# Patient Record
Sex: Female | Born: 1950
Health system: Southern US, Community
[De-identification: ages and names within clinical notes are randomized; demographics above are authoritative.]

## PROBLEM LIST (undated history)

## (undated) DIAGNOSIS — K759 Inflammatory liver disease, unspecified: Secondary | ICD-10-CM

## (undated) DIAGNOSIS — F988 Other specified behavioral and emotional disorders with onset usually occurring in childhood and adolescence: Secondary | ICD-10-CM

## (undated) DIAGNOSIS — K5900 Constipation, unspecified: Secondary | ICD-10-CM

## (undated) DIAGNOSIS — M1990 Unspecified osteoarthritis, unspecified site: Secondary | ICD-10-CM

## (undated) DIAGNOSIS — R600 Localized edema: Secondary | ICD-10-CM

## (undated) DIAGNOSIS — M797 Fibromyalgia: Secondary | ICD-10-CM

## (undated) DIAGNOSIS — F319 Bipolar disorder, unspecified: Secondary | ICD-10-CM

## (undated) DIAGNOSIS — R198 Other specified symptoms and signs involving the digestive system and abdomen: Secondary | ICD-10-CM

## (undated) DIAGNOSIS — S134XXA Sprain of ligaments of cervical spine, initial encounter: Secondary | ICD-10-CM

## (undated) DIAGNOSIS — K219 Gastro-esophageal reflux disease without esophagitis: Secondary | ICD-10-CM

## (undated) DIAGNOSIS — K589 Irritable bowel syndrome without diarrhea: Secondary | ICD-10-CM

## (undated) DIAGNOSIS — M810 Age-related osteoporosis without current pathological fracture: Secondary | ICD-10-CM

## (undated) DIAGNOSIS — S149XXA Injury of unspecified nerves of neck, initial encounter: Secondary | ICD-10-CM

## (undated) DIAGNOSIS — J40 Bronchitis, not specified as acute or chronic: Secondary | ICD-10-CM

## (undated) DIAGNOSIS — F329 Major depressive disorder, single episode, unspecified: Secondary | ICD-10-CM

## (undated) DIAGNOSIS — M549 Dorsalgia, unspecified: Secondary | ICD-10-CM

## (undated) DIAGNOSIS — G589 Mononeuropathy, unspecified: Secondary | ICD-10-CM

## (undated) DIAGNOSIS — H269 Unspecified cataract: Secondary | ICD-10-CM

## (undated) DIAGNOSIS — E785 Hyperlipidemia, unspecified: Secondary | ICD-10-CM

## (undated) DIAGNOSIS — R0602 Shortness of breath: Secondary | ICD-10-CM

## (undated) DIAGNOSIS — F32A Depression, unspecified: Secondary | ICD-10-CM

## (undated) DIAGNOSIS — Z8739 Personal history of other diseases of the musculoskeletal system and connective tissue: Secondary | ICD-10-CM

## (undated) DIAGNOSIS — F419 Anxiety disorder, unspecified: Secondary | ICD-10-CM

## (undated) HISTORY — DX: Bipolar disorder, unspecified: F31.9

## (undated) HISTORY — PX: TONSILLECTOMY: SUR1361

## (undated) HISTORY — PX: BREAST SURGERY: SHX581

## (undated) HISTORY — DX: Other specified behavioral and emotional disorders with onset usually occurring in childhood and adolescence: F98.8

## (undated) HISTORY — DX: Constipation, unspecified: K59.00

## (undated) HISTORY — DX: Gastro-esophageal reflux disease without esophagitis: K21.9

## (undated) HISTORY — PX: CHOLECYSTECTOMY: SHX55

## (undated) HISTORY — DX: Other specified symptoms and signs involving the digestive system and abdomen: R19.8

## (undated) HISTORY — DX: Major depressive disorder, single episode, unspecified: F32.9

## (undated) HISTORY — PX: HERNIA REPAIR: SHX51

## (undated) HISTORY — DX: Personal history of other diseases of the musculoskeletal system and connective tissue: Z87.39

## (undated) HISTORY — DX: Sprain of ligaments of cervical spine, initial encounter: S13.4XXA

## (undated) HISTORY — PX: BREAST EXCISIONAL BIOPSY: SUR124

## (undated) HISTORY — DX: Shortness of breath: R06.02

## (undated) HISTORY — DX: Depression, unspecified: F32.A

## (undated) HISTORY — DX: Injury of unspecified nerves of neck, initial encounter: S14.9XXA

## (undated) HISTORY — PX: UPPER GI ENDOSCOPY: SHX6162

## (undated) HISTORY — DX: Irritable bowel syndrome, unspecified: K58.9

## (undated) HISTORY — DX: Dorsalgia, unspecified: M54.9

## (undated) HISTORY — DX: Fibromyalgia: M79.7

## (undated) HISTORY — DX: Hyperlipidemia, unspecified: E78.5

## (undated) HISTORY — DX: Mononeuropathy, unspecified: G58.9

## (undated) HISTORY — DX: Localized edema: R60.0

## (undated) HISTORY — PX: CARPAL TUNNEL RELEASE: SHX101

---

## 1974-01-12 DIAGNOSIS — K759 Inflammatory liver disease, unspecified: Secondary | ICD-10-CM

## 1974-01-12 HISTORY — DX: Inflammatory liver disease, unspecified: K75.9

## 1999-01-13 HISTORY — PX: BLADDER SUSPENSION: SHX72

## 1999-01-13 HISTORY — PX: ABDOMINAL HYSTERECTOMY: SHX81

## 2003-03-30 ENCOUNTER — Other Ambulatory Visit: Payer: Self-pay

## 2003-11-06 ENCOUNTER — Ambulatory Visit: Payer: Self-pay | Admitting: Obstetrics and Gynecology

## 2004-02-05 ENCOUNTER — Encounter: Payer: Self-pay | Admitting: Family Medicine

## 2004-02-08 ENCOUNTER — Emergency Department: Payer: Self-pay | Admitting: Unknown Physician Specialty

## 2004-02-13 ENCOUNTER — Encounter: Payer: Self-pay | Admitting: Family Medicine

## 2004-03-12 ENCOUNTER — Encounter: Payer: Self-pay | Admitting: Family Medicine

## 2004-03-24 ENCOUNTER — Ambulatory Visit: Payer: Self-pay | Admitting: Unknown Physician Specialty

## 2004-03-31 ENCOUNTER — Emergency Department: Payer: Self-pay | Admitting: Emergency Medicine

## 2004-05-13 ENCOUNTER — Ambulatory Visit: Payer: Self-pay | Admitting: Unknown Physician Specialty

## 2004-08-14 ENCOUNTER — Ambulatory Visit: Payer: Self-pay | Admitting: Surgery

## 2004-08-24 ENCOUNTER — Other Ambulatory Visit: Payer: Self-pay

## 2004-08-24 ENCOUNTER — Emergency Department: Payer: Self-pay | Admitting: Unknown Physician Specialty

## 2004-09-05 ENCOUNTER — Emergency Department (HOSPITAL_COMMUNITY): Admission: EM | Admit: 2004-09-05 | Discharge: 2004-09-06 | Payer: Self-pay | Admitting: Emergency Medicine

## 2004-10-23 ENCOUNTER — Ambulatory Visit: Payer: Self-pay | Admitting: Family Medicine

## 2004-10-27 ENCOUNTER — Encounter: Payer: Self-pay | Admitting: Family Medicine

## 2004-11-03 ENCOUNTER — Emergency Department (HOSPITAL_COMMUNITY): Admission: EM | Admit: 2004-11-03 | Discharge: 2004-11-04 | Payer: Self-pay | Admitting: *Deleted

## 2004-11-04 ENCOUNTER — Ambulatory Visit: Payer: Self-pay | Admitting: Psychiatry

## 2004-11-04 ENCOUNTER — Inpatient Hospital Stay (HOSPITAL_COMMUNITY): Admission: RE | Admit: 2004-11-04 | Discharge: 2004-11-08 | Payer: Self-pay | Admitting: Psychiatry

## 2004-11-14 ENCOUNTER — Emergency Department (HOSPITAL_COMMUNITY): Admission: EM | Admit: 2004-11-14 | Discharge: 2004-11-15 | Payer: Self-pay | Admitting: Emergency Medicine

## 2004-11-15 ENCOUNTER — Ambulatory Visit: Payer: Self-pay | Admitting: Psychiatry

## 2004-11-15 ENCOUNTER — Inpatient Hospital Stay (HOSPITAL_COMMUNITY): Admission: EM | Admit: 2004-11-15 | Discharge: 2004-11-19 | Payer: Self-pay | Admitting: Psychiatry

## 2004-11-25 ENCOUNTER — Ambulatory Visit: Payer: Self-pay | Admitting: Neurology

## 2004-11-26 ENCOUNTER — Ambulatory Visit: Payer: Self-pay | Admitting: Family Medicine

## 2004-11-28 ENCOUNTER — Encounter
Admission: RE | Admit: 2004-11-28 | Discharge: 2005-02-26 | Payer: Self-pay | Admitting: Physical Medicine & Rehabilitation

## 2004-11-28 ENCOUNTER — Ambulatory Visit: Payer: Self-pay | Admitting: Physical Medicine & Rehabilitation

## 2004-12-19 ENCOUNTER — Ambulatory Visit: Payer: Self-pay | Admitting: Physical Medicine & Rehabilitation

## 2005-01-12 HISTORY — PX: NOSE SURGERY: SHX723

## 2005-01-12 HISTORY — PX: VENTRAL HERNIA REPAIR: SHX424

## 2005-01-12 HISTORY — PX: TOE AMPUTATION: SHX809

## 2005-02-17 ENCOUNTER — Ambulatory Visit: Payer: Self-pay | Admitting: Rheumatology

## 2005-02-18 ENCOUNTER — Ambulatory Visit: Payer: Self-pay | Admitting: Physical Medicine & Rehabilitation

## 2005-02-26 ENCOUNTER — Emergency Department: Payer: Self-pay | Admitting: Unknown Physician Specialty

## 2005-03-18 ENCOUNTER — Encounter
Admission: RE | Admit: 2005-03-18 | Discharge: 2005-06-16 | Payer: Self-pay | Admitting: Physical Medicine & Rehabilitation

## 2005-03-25 ENCOUNTER — Ambulatory Visit: Payer: Self-pay | Admitting: Physical Medicine & Rehabilitation

## 2005-04-22 ENCOUNTER — Ambulatory Visit: Payer: Self-pay | Admitting: Family Medicine

## 2005-05-04 ENCOUNTER — Ambulatory Visit: Payer: Self-pay | Admitting: Family Medicine

## 2005-05-08 ENCOUNTER — Emergency Department: Payer: Self-pay | Admitting: Emergency Medicine

## 2005-05-10 ENCOUNTER — Ambulatory Visit: Payer: Self-pay | Admitting: Internal Medicine

## 2005-06-25 ENCOUNTER — Ambulatory Visit: Payer: Self-pay | Admitting: Physical Medicine & Rehabilitation

## 2005-06-25 ENCOUNTER — Encounter
Admission: RE | Admit: 2005-06-25 | Discharge: 2005-09-23 | Payer: Self-pay | Admitting: Physical Medicine & Rehabilitation

## 2005-07-20 ENCOUNTER — Emergency Department: Payer: Self-pay | Admitting: Emergency Medicine

## 2005-07-22 ENCOUNTER — Inpatient Hospital Stay: Payer: Self-pay | Admitting: Internal Medicine

## 2005-09-21 ENCOUNTER — Ambulatory Visit: Payer: Self-pay | Admitting: Unknown Physician Specialty

## 2005-09-22 ENCOUNTER — Ambulatory Visit: Payer: Self-pay | Admitting: Surgery

## 2005-10-12 ENCOUNTER — Ambulatory Visit: Payer: Self-pay | Admitting: Urology

## 2005-10-14 ENCOUNTER — Encounter
Admission: RE | Admit: 2005-10-14 | Discharge: 2006-01-12 | Payer: Self-pay | Admitting: Physical Medicine & Rehabilitation

## 2005-10-14 ENCOUNTER — Ambulatory Visit: Payer: Self-pay | Admitting: Physical Medicine & Rehabilitation

## 2005-12-15 ENCOUNTER — Ambulatory Visit: Payer: Self-pay | Admitting: Unknown Physician Specialty

## 2005-12-30 ENCOUNTER — Encounter
Admission: RE | Admit: 2005-12-30 | Discharge: 2006-03-30 | Payer: Self-pay | Admitting: Physical Medicine & Rehabilitation

## 2005-12-30 ENCOUNTER — Ambulatory Visit: Payer: Self-pay | Admitting: Physical Medicine & Rehabilitation

## 2006-01-15 ENCOUNTER — Ambulatory Visit: Payer: Self-pay | Admitting: Unknown Physician Specialty

## 2006-03-10 ENCOUNTER — Encounter
Admission: RE | Admit: 2006-03-10 | Discharge: 2006-06-08 | Payer: Self-pay | Admitting: Physical Medicine & Rehabilitation

## 2006-03-10 ENCOUNTER — Ambulatory Visit: Payer: Self-pay | Admitting: Unknown Physician Specialty

## 2006-03-11 ENCOUNTER — Ambulatory Visit: Payer: Self-pay | Admitting: Physical Medicine & Rehabilitation

## 2006-03-15 ENCOUNTER — Ambulatory Visit: Payer: Self-pay | Admitting: Otolaryngology

## 2006-05-27 ENCOUNTER — Ambulatory Visit: Payer: Self-pay | Admitting: Family Medicine

## 2006-06-23 ENCOUNTER — Encounter
Admission: RE | Admit: 2006-06-23 | Discharge: 2006-09-21 | Payer: Self-pay | Admitting: Physical Medicine & Rehabilitation

## 2006-06-28 ENCOUNTER — Ambulatory Visit: Payer: Self-pay | Admitting: Emergency Medicine

## 2006-06-29 ENCOUNTER — Ambulatory Visit: Payer: Self-pay | Admitting: Emergency Medicine

## 2006-08-23 ENCOUNTER — Ambulatory Visit: Payer: Self-pay | Admitting: Physical Medicine & Rehabilitation

## 2006-09-28 ENCOUNTER — Encounter: Payer: Self-pay | Admitting: Physical Medicine & Rehabilitation

## 2006-09-29 ENCOUNTER — Encounter
Admission: RE | Admit: 2006-09-29 | Discharge: 2006-12-28 | Payer: Self-pay | Admitting: Physical Medicine & Rehabilitation

## 2006-10-06 ENCOUNTER — Ambulatory Visit: Payer: Self-pay

## 2006-10-13 ENCOUNTER — Encounter: Payer: Self-pay | Admitting: Physical Medicine & Rehabilitation

## 2006-11-13 ENCOUNTER — Encounter: Payer: Self-pay | Admitting: Physical Medicine & Rehabilitation

## 2006-11-13 ENCOUNTER — Ambulatory Visit: Payer: Self-pay | Admitting: Internal Medicine

## 2006-11-22 ENCOUNTER — Ambulatory Visit: Payer: Self-pay | Admitting: Physical Medicine & Rehabilitation

## 2006-12-10 ENCOUNTER — Ambulatory Visit: Payer: Self-pay | Admitting: Emergency Medicine

## 2006-12-13 ENCOUNTER — Encounter
Admission: RE | Admit: 2006-12-13 | Discharge: 2006-12-14 | Payer: Self-pay | Admitting: Physical Medicine & Rehabilitation

## 2007-01-06 ENCOUNTER — Ambulatory Visit: Payer: Self-pay | Admitting: Internal Medicine

## 2007-01-13 HISTORY — PX: TOTAL KNEE ARTHROPLASTY: SHX125

## 2007-02-23 ENCOUNTER — Ambulatory Visit: Payer: Self-pay | Admitting: Physical Medicine & Rehabilitation

## 2007-02-23 ENCOUNTER — Encounter
Admission: RE | Admit: 2007-02-23 | Discharge: 2007-04-11 | Payer: Self-pay | Admitting: Physical Medicine & Rehabilitation

## 2007-04-08 ENCOUNTER — Ambulatory Visit: Payer: Self-pay | Admitting: Physical Medicine & Rehabilitation

## 2007-06-08 ENCOUNTER — Ambulatory Visit: Payer: Self-pay | Admitting: Family Medicine

## 2007-08-16 ENCOUNTER — Emergency Department (HOSPITAL_COMMUNITY): Admission: EM | Admit: 2007-08-16 | Discharge: 2007-08-16 | Payer: Self-pay | Admitting: Emergency Medicine

## 2007-10-28 ENCOUNTER — Inpatient Hospital Stay (HOSPITAL_COMMUNITY): Admission: RE | Admit: 2007-10-28 | Discharge: 2007-10-31 | Payer: Self-pay | Admitting: Orthopedic Surgery

## 2007-11-16 ENCOUNTER — Encounter: Admission: RE | Admit: 2007-11-16 | Discharge: 2007-11-16 | Payer: Self-pay | Admitting: Internal Medicine

## 2007-11-28 ENCOUNTER — Emergency Department (HOSPITAL_COMMUNITY): Admission: EM | Admit: 2007-11-28 | Discharge: 2007-11-28 | Payer: Self-pay | Admitting: Emergency Medicine

## 2007-11-29 ENCOUNTER — Encounter: Admission: RE | Admit: 2007-11-29 | Discharge: 2007-11-29 | Payer: Self-pay | Admitting: Internal Medicine

## 2008-01-20 ENCOUNTER — Other Ambulatory Visit: Admission: RE | Admit: 2008-01-20 | Discharge: 2008-01-20 | Payer: Self-pay | Admitting: Family Medicine

## 2008-01-20 ENCOUNTER — Ambulatory Visit: Payer: Self-pay | Admitting: Family Medicine

## 2008-01-20 ENCOUNTER — Encounter: Payer: Self-pay | Admitting: Family Medicine

## 2008-01-20 DIAGNOSIS — Z8619 Personal history of other infectious and parasitic diseases: Secondary | ICD-10-CM | POA: Insufficient documentation

## 2008-01-20 DIAGNOSIS — Z9889 Other specified postprocedural states: Secondary | ICD-10-CM | POA: Insufficient documentation

## 2008-01-20 DIAGNOSIS — D3500 Benign neoplasm of unspecified adrenal gland: Secondary | ICD-10-CM | POA: Insufficient documentation

## 2008-01-20 DIAGNOSIS — M069 Rheumatoid arthritis, unspecified: Secondary | ICD-10-CM | POA: Insufficient documentation

## 2008-01-20 DIAGNOSIS — N6019 Diffuse cystic mastopathy of unspecified breast: Secondary | ICD-10-CM | POA: Insufficient documentation

## 2008-01-20 DIAGNOSIS — F321 Major depressive disorder, single episode, moderate: Secondary | ICD-10-CM | POA: Insufficient documentation

## 2008-01-20 DIAGNOSIS — K439 Ventral hernia without obstruction or gangrene: Secondary | ICD-10-CM | POA: Insufficient documentation

## 2008-01-20 DIAGNOSIS — K219 Gastro-esophageal reflux disease without esophagitis: Secondary | ICD-10-CM | POA: Insufficient documentation

## 2008-01-20 DIAGNOSIS — S98139A Complete traumatic amputation of one unspecified lesser toe, initial encounter: Secondary | ICD-10-CM | POA: Insufficient documentation

## 2008-01-20 LAB — CONVERTED CEMR LAB
Blood in Urine, dipstick: NEGATIVE
Nitrite: NEGATIVE
Specific Gravity, Urine: 1.02
Urobilinogen, UA: NEGATIVE
WBC Urine, dipstick: NEGATIVE

## 2008-01-27 ENCOUNTER — Encounter (INDEPENDENT_AMBULATORY_CARE_PROVIDER_SITE_OTHER): Payer: Self-pay | Admitting: *Deleted

## 2008-02-02 LAB — CONVERTED CEMR LAB
ALT: 26 units/L (ref 0–35)
AST: 25 units/L (ref 0–37)
Albumin: 4.2 g/dL (ref 3.5–5.2)
Alkaline Phosphatase: 69 units/L (ref 39–117)
BUN: 10 mg/dL (ref 6–23)
Basophils Absolute: 0 10*3/uL (ref 0.0–0.1)
Basophils Relative: 1 % (ref 0–1)
Calcium: 9.8 mg/dL (ref 8.4–10.5)
Chloride: 102 meq/L (ref 96–112)
Cholesterol: 233 mg/dL — ABNORMAL HIGH (ref 0–200)
Creatinine, Ser: 0.61 mg/dL (ref 0.40–1.20)
Eosinophils Absolute: 0.5 10*3/uL (ref 0.0–0.7)
Eosinophils Relative: 6 % — ABNORMAL HIGH (ref 0–5)
HDL: 50 mg/dL (ref >39)
Hemoglobin: 13.4 g/dL (ref 12.0–15.0)
Indirect Bilirubin: 0.3 mg/dL (ref 0.0–0.9)
MCHC: 32.9 g/dL (ref 30.0–36.0)
MCV: 83.6 fL (ref 78.0–100.0)
Monocytes Absolute: 0.6 10*3/uL (ref 0.1–1.0)
Monocytes Relative: 7 % (ref 3–12)
Neutro Abs: 4 10*3/uL (ref 1.7–7.7)
RDW: 12.7 % (ref 11.5–15.5)
Total Protein: 6.8 g/dL (ref 6.0–8.3)
Triglycerides: 294 mg/dL — ABNORMAL HIGH (ref <150)

## 2008-02-03 ENCOUNTER — Encounter (INDEPENDENT_AMBULATORY_CARE_PROVIDER_SITE_OTHER): Payer: Self-pay | Admitting: *Deleted

## 2008-02-06 ENCOUNTER — Encounter: Payer: Self-pay | Admitting: Family Medicine

## 2008-02-09 ENCOUNTER — Telehealth (INDEPENDENT_AMBULATORY_CARE_PROVIDER_SITE_OTHER): Payer: Self-pay | Admitting: *Deleted

## 2008-03-05 ENCOUNTER — Telehealth (INDEPENDENT_AMBULATORY_CARE_PROVIDER_SITE_OTHER): Payer: Self-pay | Admitting: *Deleted

## 2008-03-05 ENCOUNTER — Encounter: Payer: Self-pay | Admitting: Family Medicine

## 2008-03-07 ENCOUNTER — Ambulatory Visit: Payer: Self-pay | Admitting: Family Medicine

## 2008-03-07 DIAGNOSIS — K589 Irritable bowel syndrome without diarrhea: Secondary | ICD-10-CM | POA: Insufficient documentation

## 2008-03-07 DIAGNOSIS — K222 Esophageal obstruction: Secondary | ICD-10-CM | POA: Insufficient documentation

## 2008-03-07 DIAGNOSIS — J45909 Unspecified asthma, uncomplicated: Secondary | ICD-10-CM | POA: Insufficient documentation

## 2008-03-07 LAB — CONVERTED CEMR LAB: OCCULT 3: NEGATIVE

## 2008-03-08 ENCOUNTER — Encounter (INDEPENDENT_AMBULATORY_CARE_PROVIDER_SITE_OTHER): Payer: Self-pay | Admitting: *Deleted

## 2008-03-08 ENCOUNTER — Encounter: Payer: Self-pay | Admitting: Family Medicine

## 2008-03-08 ENCOUNTER — Telehealth: Payer: Self-pay | Admitting: Family Medicine

## 2008-03-27 ENCOUNTER — Telehealth (INDEPENDENT_AMBULATORY_CARE_PROVIDER_SITE_OTHER): Payer: Self-pay | Admitting: *Deleted

## 2008-04-05 ENCOUNTER — Encounter: Payer: Self-pay | Admitting: Family Medicine

## 2008-05-15 ENCOUNTER — Telehealth (INDEPENDENT_AMBULATORY_CARE_PROVIDER_SITE_OTHER): Payer: Self-pay | Admitting: *Deleted

## 2008-05-17 ENCOUNTER — Telehealth (INDEPENDENT_AMBULATORY_CARE_PROVIDER_SITE_OTHER): Payer: Self-pay | Admitting: *Deleted

## 2008-05-19 ENCOUNTER — Encounter: Payer: Self-pay | Admitting: Family Medicine

## 2008-06-14 ENCOUNTER — Ambulatory Visit: Payer: Self-pay | Admitting: Family Medicine

## 2008-06-14 DIAGNOSIS — F988 Other specified behavioral and emotional disorders with onset usually occurring in childhood and adolescence: Secondary | ICD-10-CM | POA: Insufficient documentation

## 2008-06-18 ENCOUNTER — Telehealth: Payer: Self-pay | Admitting: Family Medicine

## 2008-06-19 ENCOUNTER — Telehealth (INDEPENDENT_AMBULATORY_CARE_PROVIDER_SITE_OTHER): Payer: Self-pay | Admitting: *Deleted

## 2008-06-20 ENCOUNTER — Encounter: Payer: Self-pay | Admitting: Family Medicine

## 2008-06-21 ENCOUNTER — Encounter: Payer: Self-pay | Admitting: Family Medicine

## 2008-06-22 ENCOUNTER — Telehealth (INDEPENDENT_AMBULATORY_CARE_PROVIDER_SITE_OTHER): Payer: Self-pay | Admitting: *Deleted

## 2008-06-25 ENCOUNTER — Ambulatory Visit: Payer: Self-pay | Admitting: Family Medicine

## 2008-06-27 LAB — CONVERTED CEMR LAB
AST: 29 units/L (ref 0–37)
Albumin: 4.6 g/dL (ref 3.5–5.2)
Alkaline Phosphatase: 70 units/L (ref 39–117)
BUN: 12 mg/dL (ref 6–23)
Chloride: 105 meq/L (ref 96–112)
Cholesterol: 242 mg/dL — ABNORMAL HIGH (ref 0–200)
Creatinine, Ser: 0.9 mg/dL (ref 0.4–1.2)
Direct LDL: 148.4 mg/dL
GFR calc non Af Amer: 68.24 mL/min (ref >60)
HDL: 38.1 mg/dL — ABNORMAL LOW (ref >39.00)
Potassium: 4.5 meq/L (ref 3.5–5.1)
Total Bilirubin: 0.8 mg/dL (ref 0.3–1.2)
Total CHOL/HDL Ratio: 6
VLDL: 60 mg/dL — ABNORMAL HIGH (ref 0.0–40.0)
Vit D, 25-Hydroxy: 36 ng/mL (ref 30–89)

## 2008-06-28 ENCOUNTER — Encounter (INDEPENDENT_AMBULATORY_CARE_PROVIDER_SITE_OTHER): Payer: Self-pay | Admitting: *Deleted

## 2008-06-28 ENCOUNTER — Encounter: Payer: Self-pay | Admitting: Family Medicine

## 2008-06-28 ENCOUNTER — Telehealth (INDEPENDENT_AMBULATORY_CARE_PROVIDER_SITE_OTHER): Payer: Self-pay | Admitting: *Deleted

## 2008-07-09 ENCOUNTER — Telehealth (INDEPENDENT_AMBULATORY_CARE_PROVIDER_SITE_OTHER): Payer: Self-pay | Admitting: *Deleted

## 2008-07-13 ENCOUNTER — Encounter: Payer: Self-pay | Admitting: Family Medicine

## 2008-07-23 ENCOUNTER — Encounter: Payer: Self-pay | Admitting: Family Medicine

## 2008-07-24 ENCOUNTER — Ambulatory Visit: Payer: Self-pay | Admitting: Family Medicine

## 2008-07-24 DIAGNOSIS — E785 Hyperlipidemia, unspecified: Secondary | ICD-10-CM | POA: Insufficient documentation

## 2008-07-25 LAB — CONVERTED CEMR LAB: Total CK: 70 units/L (ref 7–177)

## 2008-07-26 ENCOUNTER — Encounter (INDEPENDENT_AMBULATORY_CARE_PROVIDER_SITE_OTHER): Payer: Self-pay | Admitting: *Deleted

## 2008-07-30 ENCOUNTER — Telehealth (INDEPENDENT_AMBULATORY_CARE_PROVIDER_SITE_OTHER): Payer: Self-pay | Admitting: *Deleted

## 2008-08-08 ENCOUNTER — Telehealth (INDEPENDENT_AMBULATORY_CARE_PROVIDER_SITE_OTHER): Payer: Self-pay | Admitting: *Deleted

## 2008-08-15 ENCOUNTER — Telehealth: Payer: Self-pay | Admitting: Family Medicine

## 2008-09-05 ENCOUNTER — Telehealth (INDEPENDENT_AMBULATORY_CARE_PROVIDER_SITE_OTHER): Payer: Self-pay | Admitting: *Deleted

## 2008-09-05 DIAGNOSIS — E049 Nontoxic goiter, unspecified: Secondary | ICD-10-CM | POA: Insufficient documentation

## 2008-09-10 ENCOUNTER — Telehealth (INDEPENDENT_AMBULATORY_CARE_PROVIDER_SITE_OTHER): Payer: Self-pay | Admitting: *Deleted

## 2008-09-18 ENCOUNTER — Encounter: Payer: Self-pay | Admitting: Family Medicine

## 2008-09-25 ENCOUNTER — Telehealth (INDEPENDENT_AMBULATORY_CARE_PROVIDER_SITE_OTHER): Payer: Self-pay | Admitting: *Deleted

## 2008-11-08 ENCOUNTER — Telehealth: Payer: Self-pay | Admitting: Family Medicine

## 2008-11-09 ENCOUNTER — Telehealth: Payer: Self-pay | Admitting: Family Medicine

## 2008-11-12 ENCOUNTER — Ambulatory Visit: Payer: Self-pay | Admitting: Family Medicine

## 2008-11-16 ENCOUNTER — Ambulatory Visit: Payer: Self-pay | Admitting: Family Medicine

## 2008-12-18 ENCOUNTER — Telehealth (INDEPENDENT_AMBULATORY_CARE_PROVIDER_SITE_OTHER): Payer: Self-pay | Admitting: *Deleted

## 2008-12-19 ENCOUNTER — Encounter: Payer: Self-pay | Admitting: Family Medicine

## 2008-12-21 ENCOUNTER — Encounter: Payer: Self-pay | Admitting: Family Medicine

## 2008-12-24 ENCOUNTER — Telehealth (INDEPENDENT_AMBULATORY_CARE_PROVIDER_SITE_OTHER): Payer: Self-pay | Admitting: *Deleted

## 2008-12-26 ENCOUNTER — Telehealth (INDEPENDENT_AMBULATORY_CARE_PROVIDER_SITE_OTHER): Payer: Self-pay | Admitting: *Deleted

## 2009-01-15 ENCOUNTER — Encounter: Payer: Self-pay | Admitting: Family Medicine

## 2009-01-15 ENCOUNTER — Telehealth (INDEPENDENT_AMBULATORY_CARE_PROVIDER_SITE_OTHER): Payer: Self-pay | Admitting: *Deleted

## 2009-01-21 ENCOUNTER — Telehealth: Payer: Self-pay | Admitting: Family Medicine

## 2009-01-24 ENCOUNTER — Encounter: Payer: Self-pay | Admitting: Family Medicine

## 2009-02-06 ENCOUNTER — Encounter: Payer: Self-pay | Admitting: Family Medicine

## 2009-02-11 ENCOUNTER — Telehealth: Payer: Self-pay | Admitting: Family Medicine

## 2009-02-14 ENCOUNTER — Encounter: Payer: Self-pay | Admitting: Family Medicine

## 2009-02-15 ENCOUNTER — Ambulatory Visit: Payer: Self-pay | Admitting: Family Medicine

## 2009-02-15 DIAGNOSIS — N390 Urinary tract infection, site not specified: Secondary | ICD-10-CM | POA: Insufficient documentation

## 2009-02-15 LAB — CONVERTED CEMR LAB
Nitrite: POSITIVE
Specific Gravity, Urine: 1.02
Urobilinogen, UA: 0.2

## 2009-02-16 ENCOUNTER — Encounter: Payer: Self-pay | Admitting: Family Medicine

## 2009-02-18 ENCOUNTER — Telehealth: Payer: Self-pay | Admitting: Family Medicine

## 2009-02-18 ENCOUNTER — Telehealth (INDEPENDENT_AMBULATORY_CARE_PROVIDER_SITE_OTHER): Payer: Self-pay | Admitting: *Deleted

## 2009-02-20 ENCOUNTER — Telehealth: Payer: Self-pay | Admitting: Family Medicine

## 2009-04-01 ENCOUNTER — Telehealth (INDEPENDENT_AMBULATORY_CARE_PROVIDER_SITE_OTHER): Payer: Self-pay | Admitting: *Deleted

## 2009-04-17 ENCOUNTER — Ambulatory Visit: Payer: Self-pay | Admitting: Family Medicine

## 2009-04-17 ENCOUNTER — Other Ambulatory Visit: Admission: RE | Admit: 2009-04-17 | Discharge: 2009-04-17 | Payer: Self-pay | Admitting: Family Medicine

## 2009-04-17 DIAGNOSIS — E559 Vitamin D deficiency, unspecified: Secondary | ICD-10-CM | POA: Insufficient documentation

## 2009-04-17 DIAGNOSIS — Z780 Asymptomatic menopausal state: Secondary | ICD-10-CM | POA: Insufficient documentation

## 2009-04-17 LAB — CONVERTED CEMR LAB
Bilirubin Urine: NEGATIVE
Nitrite: NEGATIVE
Protein, U semiquant: NEGATIVE
Specific Gravity, Urine: 1.01
pH: 6

## 2009-04-18 LAB — CONVERTED CEMR LAB: Cortisol, Plasma: 8.1 ug/dL

## 2009-04-19 ENCOUNTER — Telehealth: Payer: Self-pay | Admitting: Family Medicine

## 2009-04-19 LAB — CONVERTED CEMR LAB
ALT: 28 units/L (ref 0–35)
Albumin: 4.3 g/dL (ref 3.5–5.2)
Alkaline Phosphatase: 83 units/L (ref 39–117)
Basophils Relative: 0.6 % (ref 0.0–3.0)
Bilirubin, Direct: 0 mg/dL (ref 0.0–0.3)
Chloride: 103 meq/L (ref 96–112)
Creatinine, Ser: 0.8 mg/dL (ref 0.4–1.2)
Eosinophils Relative: 5.3 % — ABNORMAL HIGH (ref 0.0–5.0)
HCT: 40.5 % (ref 36.0–46.0)
Hemoglobin: 13.8 g/dL (ref 12.0–15.0)
MCV: 86.9 fL (ref 78.0–100.0)
Monocytes Absolute: 0.5 10*3/uL (ref 0.1–1.0)
Neutro Abs: 2.7 10*3/uL (ref 1.4–7.7)
Neutrophils Relative %: 44.9 % (ref 43.0–77.0)
Potassium: 3.7 meq/L (ref 3.5–5.1)
RBC: 4.66 M/uL (ref 3.87–5.11)
Sodium: 141 meq/L (ref 135–145)
TSH: 2.7 microintl units/mL (ref 0.35–5.50)
Total CHOL/HDL Ratio: 4
VLDL: 19.6 mg/dL (ref 0.0–40.0)
Vit D, 25-Hydroxy: 52 ng/mL (ref 30–89)
WBC: 6 10*3/uL (ref 4.5–10.5)

## 2009-04-22 ENCOUNTER — Ambulatory Visit: Payer: Self-pay | Admitting: Internal Medicine

## 2009-04-22 ENCOUNTER — Telehealth: Payer: Self-pay | Admitting: Family Medicine

## 2009-04-23 ENCOUNTER — Encounter (INDEPENDENT_AMBULATORY_CARE_PROVIDER_SITE_OTHER): Payer: Self-pay | Admitting: *Deleted

## 2009-04-23 LAB — CONVERTED CEMR LAB: Pap Smear: NEGATIVE

## 2009-04-30 ENCOUNTER — Encounter: Payer: Self-pay | Admitting: Family Medicine

## 2009-04-30 ENCOUNTER — Encounter: Admission: RE | Admit: 2009-04-30 | Discharge: 2009-04-30 | Payer: Self-pay | Admitting: Family Medicine

## 2009-05-15 ENCOUNTER — Telehealth (INDEPENDENT_AMBULATORY_CARE_PROVIDER_SITE_OTHER): Payer: Self-pay | Admitting: *Deleted

## 2009-05-23 ENCOUNTER — Telehealth (INDEPENDENT_AMBULATORY_CARE_PROVIDER_SITE_OTHER): Payer: Self-pay | Admitting: *Deleted

## 2009-05-24 ENCOUNTER — Encounter: Payer: Self-pay | Admitting: Family Medicine

## 2009-05-25 ENCOUNTER — Encounter: Payer: Self-pay | Admitting: Family Medicine

## 2009-06-25 ENCOUNTER — Encounter: Payer: Self-pay | Admitting: Family Medicine

## 2009-07-25 ENCOUNTER — Telehealth (INDEPENDENT_AMBULATORY_CARE_PROVIDER_SITE_OTHER): Payer: Self-pay | Admitting: *Deleted

## 2009-08-05 ENCOUNTER — Telehealth (INDEPENDENT_AMBULATORY_CARE_PROVIDER_SITE_OTHER): Payer: Self-pay | Admitting: *Deleted

## 2009-08-06 ENCOUNTER — Telehealth: Payer: Self-pay | Admitting: Family Medicine

## 2009-08-07 ENCOUNTER — Encounter: Payer: Self-pay | Admitting: Family Medicine

## 2009-08-08 ENCOUNTER — Encounter: Payer: Self-pay | Admitting: Family Medicine

## 2009-09-30 ENCOUNTER — Ambulatory Visit: Payer: Self-pay | Admitting: Family Medicine

## 2009-10-24 ENCOUNTER — Ambulatory Visit: Payer: Self-pay | Admitting: Family Medicine

## 2009-10-24 DIAGNOSIS — M859 Disorder of bone density and structure, unspecified: Secondary | ICD-10-CM | POA: Insufficient documentation

## 2009-10-24 DIAGNOSIS — M818 Other osteoporosis without current pathological fracture: Secondary | ICD-10-CM

## 2009-10-24 DIAGNOSIS — M8580 Other specified disorders of bone density and structure, unspecified site: Secondary | ICD-10-CM | POA: Insufficient documentation

## 2009-10-28 LAB — CONVERTED CEMR LAB: Vit D, 25-Hydroxy: 63 ng/mL (ref 30–89)

## 2009-10-30 ENCOUNTER — Telehealth (INDEPENDENT_AMBULATORY_CARE_PROVIDER_SITE_OTHER): Payer: Self-pay | Admitting: *Deleted

## 2009-10-30 LAB — CONVERTED CEMR LAB
AST: 32 units/L (ref 0–37)
Albumin: 4.4 g/dL (ref 3.5–5.2)
BUN: 16 mg/dL (ref 6–23)
Basophils Absolute: 0 10*3/uL (ref 0.0–0.1)
CO2: 27 meq/L (ref 19–32)
Chloride: 102 meq/L (ref 96–112)
Direct LDL: 159.5 mg/dL
Eosinophils Absolute: 0.4 10*3/uL (ref 0.0–0.7)
GFR calc non Af Amer: 82.57 mL/min (ref >60)
Glucose, Bld: 91 mg/dL (ref 70–99)
HCT: 40.4 % (ref 36.0–46.0)
HDL: 47 mg/dL (ref >39.00)
Hemoglobin: 14 g/dL (ref 12.0–15.0)
Lymphs Abs: 2.7 10*3/uL (ref 0.7–4.0)
MCHC: 34.7 g/dL (ref 30.0–36.0)
MCV: 85.3 fL (ref 78.0–100.0)
Monocytes Absolute: 0.6 10*3/uL (ref 0.1–1.0)
Neutro Abs: 3.4 10*3/uL (ref 1.4–7.7)
Platelets: 313 10*3/uL (ref 150.0–400.0)
Potassium: 4.1 meq/L (ref 3.5–5.1)
RDW: 12.8 % (ref 11.5–14.6)
Sodium: 137 meq/L (ref 135–145)
TSH: 2.75 microintl units/mL (ref 0.35–5.50)
Total Bilirubin: 0.8 mg/dL (ref 0.3–1.2)

## 2009-11-05 ENCOUNTER — Encounter: Admission: RE | Admit: 2009-11-05 | Discharge: 2009-11-05 | Payer: Self-pay | Admitting: Chiropractic Medicine

## 2009-11-05 ENCOUNTER — Ambulatory Visit: Payer: Self-pay | Admitting: Family Medicine

## 2009-11-05 DIAGNOSIS — K5909 Other constipation: Secondary | ICD-10-CM | POA: Insufficient documentation

## 2009-11-05 DIAGNOSIS — R1012 Left upper quadrant pain: Secondary | ICD-10-CM | POA: Insufficient documentation

## 2009-11-05 DIAGNOSIS — K5900 Constipation, unspecified: Secondary | ICD-10-CM

## 2009-11-06 ENCOUNTER — Telehealth: Payer: Self-pay | Admitting: Family Medicine

## 2009-11-06 LAB — CONVERTED CEMR LAB
AST: 31 units/L (ref 0–37)
Alkaline Phosphatase: 70 units/L (ref 39–117)
Basophils Absolute: 0 10*3/uL (ref 0.0–0.1)
Basophils Relative: 0.5 % (ref 0.0–3.0)
Bilirubin, Direct: 0 mg/dL (ref 0.0–0.3)
CO2: 23 meq/L (ref 19–32)
Calcium: 10.1 mg/dL (ref 8.4–10.5)
Creatinine, Ser: 0.8 mg/dL (ref 0.4–1.2)
Eosinophils Absolute: 0.4 10*3/uL (ref 0.0–0.7)
GFR calc non Af Amer: 82.56 mL/min (ref >60)
Hemoglobin: 14.7 g/dL (ref 12.0–15.0)
Lymphocytes Relative: 42.4 % (ref 12.0–46.0)
MCHC: 34.3 g/dL (ref 30.0–36.0)
Monocytes Relative: 8 % (ref 3.0–12.0)
Neutrophils Relative %: 44.3 % (ref 43.0–77.0)
RBC: 4.99 M/uL (ref 3.87–5.11)
RDW: 12.6 % (ref 11.5–14.6)
Sodium: 140 meq/L (ref 135–145)

## 2009-11-07 ENCOUNTER — Telehealth: Payer: Self-pay | Admitting: Family Medicine

## 2009-11-08 ENCOUNTER — Ambulatory Visit: Payer: Self-pay | Admitting: Internal Medicine

## 2009-11-11 ENCOUNTER — Telehealth: Payer: Self-pay | Admitting: Family Medicine

## 2009-12-04 ENCOUNTER — Telehealth: Payer: Self-pay | Admitting: Family Medicine

## 2009-12-10 ENCOUNTER — Telehealth (INDEPENDENT_AMBULATORY_CARE_PROVIDER_SITE_OTHER): Payer: Self-pay | Admitting: *Deleted

## 2010-01-10 ENCOUNTER — Telehealth: Payer: Self-pay | Admitting: Family Medicine

## 2010-02-12 NOTE — Medication Information (Signed)
Summary: Approval for Omeprazole/Anthem BCBS  Approval for Omeprazole/Anthem BCBS   Imported By: Lanelle Bal 06/19/2009 10:06:13  _____________________________________________________________________  External Attachment:    Type:   Image     Comment:   External Document

## 2010-02-12 NOTE — Assessment & Plan Note (Signed)
Summary: FLU SHOT//KN  Nurse Visit Flu Vaccine Consent Questions     Do you have a history of severe allergic reactions to this vaccine? no    Any prior history of allergic reactions to egg and/or gelatin? no    Do you have a sensitivity to the preservative Thimersol? no    Do you have a past history of Guillan-Barre Syndrome? no    Do you currently have an acute febrile illness? no    Have you ever had a severe reaction to latex? no    Vaccine information given and explained to patient? yes    Are you currently pregnant? no    Lot Number:[id number]   Exp Date:07/12/2010   Site Given  Left Deltoid IM   Allergies: 1)  ! Erythromycin 2)  ! Sulfa 3)  ! Ceftin 4)  ! * Skelaxin 5)  ! Tetracycline 6)  ! Zocor  Orders Added: 1)  Admin 1st Vaccine [90471] 2)  Flu Vaccine 59yrs + [04540]

## 2010-02-12 NOTE — Progress Notes (Signed)
Summary: Lab Results   Phone Note Outgoing Call   Reason for Call: Discuss lab or test results Summary of Call: Ideally your LDL (bad cholesterol) should be <__100__, your HDL (good cholesterol) should be >_40__ and your triglycerides should be< 150.  Diet and exercise will increase HDL and decrease the LDL and triglycerides. Read Dr. Danice Goltz book--Eat Drink and Be Healthy.  We will recheck labs in _3__ months..---------Pt stopped zocor---was it because of SE? if no--  she needs to take it if yes----try Crestor 5 mg every other day----recheck 3 months---  272.4  lipid, hep  LMTCB... Harold Barban  April 19, 2009 4:08 PM  Patient said she stopped taking the Zocor because of leg swelling and said she refuses to take anything else for her cholesterol. I informed her it was 211 and she said that was alot better then it was. She said she was exercising and taking "Mega Reds" and would like to come in 6 months instead of 3 months to have her labs check so she doesn't [employer] money. Harold Barban  April 19, 2009 4:22 PM Initial call taken by: Harold Barban,  April 19, 2009 4:08 PM  Follow-up for Phone Call        Pt just needs to understand she is at high risk for heart attack and stroke---  I prefer to not wait too long  Follow-up by: Loreen Freud DO,  April 19, 2009 4:45 PM  Additional Follow-up for Phone Call Additional follow up Details #1::        Pt is aware. Army Fossa CMA  April 19, 2009 4:49 PM

## 2010-02-12 NOTE — Progress Notes (Signed)
Summary: Prior Auth APPROVED for Prilosec  Phone Note Refill Request Call back at [phone] Message from:  Pharmacy on May 23, 2009 4:08 PM  Refills Requested: Medication #1:  PRILOSEC 20 MG CPDR Use one tablet twice daily   Dosage confirmed as above?Dosage Confirmed Prior Auth [id number] [facility]  Next Appointment Scheduled: none Initial call taken by: Harold Barban,  May 23, 2009 4:08 PM  Follow-up for Phone Call        Prior Auth APPROVED for Prilosec until 05/24/10 .Kandice Hams  May 24, 2009 9:51 AM  Follow-up by: Kandice Hams,  May 24, 2009 9:51 AM

## 2010-02-12 NOTE — Assessment & Plan Note (Signed)
Summary: LUMP UNDER LEFT RIB; LOTS OF BACK PAIN///SPH   Vital Signs:  Patient profile:   60 year old female Weight:      141.4 pounds Temp:     97.8 degrees F oral Pulse rate:   76 / minute Pulse rhythm:   regular BP sitting:   116 / 80  (right arm) Cuff size:   regular  Vitals Entered By: Almeta Monas CMA Duncan Dull) (November 05, 2009 2:02 PM) CC: c/o lump under the ribs on the left side and constipation causing back pain x5days   History of Present Illness: Pt here c/o pain under L low ribs and mass felt by pt.  No NVD, no fevers.  Pt was also constipated but had bm this am.  Current Medications (verified): 1)  Lorazepam 1 Mg Tabs (Lorazepam) .... Take 1 Tab Two Times A Day As Needed 2)  Klonopin 1 Mg Tabs (Clonazepam) .... Take 1 Tab At Night 3)  Flexeril 10 Mg Tabs (Cyclobenzaprine Hcl) .... Take 1 Tab Two Times A Day 4)  Lyrica 150 Mg Caps (Pregabalin) .... Take 1 Tab Two Times A Day 5)  Vitamin D 16109 Unit Caps (Ergocalciferol) .... Take 1 Tab A Week- Due For Recheck On Lab 6)  Flonase 50 Mcg/act Susp (Fluticasone Propionate) .... 2 Sprays Each Nostril Once Daily 7)  Prilosec 20 Mg Cpdr (Omeprazole) .... Use One Tablet Twice Daily 8)  Levsin/sl 0.125 Mg Subl (Hyoscyamine Sulfate) .Marland Kitchen.. 1-2 By Mouth Every 4 Hours As Needed 9)  Proair Hfa 108 (90 Base) Mcg/act Aers (Albuterol Sulfate) .... 2 Puffs Qid As Needed 10)  Adderall 10 Mg Tabs (Amphetamine-Dextroamphetamine) .Marland Kitchen.. 1 By Mouth Two Times A Day 11)  Abilify 2 Mg Tabs (Aripiprazole) .... Take One Tablet Daily. 12)  Welchol 3.75 Gm Pack (Colesevelam Hcl) .Marland Kitchen.. 1 Packet A Day 13)  Estrace 0.1 Mg/gm Crea (Estradiol) .... As Directed 2 X A Week 14)  Ambien 10 Mg Tabs (Zolpidem Tartrate) .Marland Kitchen.. 1 By Mouth At Bedtime As Needed 15)  Actonel 150 Mg Tabs (Risedronate Sodium) .Marland Kitchen.. 1 By Mouth Qmo 16)  Astepro 0.15 % Soln (Azelastine Hcl) .... 2 Spray Each Nostril Once Daily  Allergies (verified): 1)  ! Erythromycin 2)  ! Sulfa 3)  !  Ceftin 4)  ! * Skelaxin 5)  ! Tetracycline 6)  ! Zocor  Past History:  Past Medical History: Last updated: 01/20/2008 GERD Rheumatoid arthritis fibromyalgia degenative disc disease IBS adrenal adenoma pt on disability Depression Hepatitis B, hx of=---  dialysis nurse  Past Surgical History: Last updated: 01/20/2008 Cholecystectomy Hysterectomy Total knee replacement---olin Caesarean section X4 L 5th toe amputation ventral hernia repair. Carpal tunnel release-- b/L scalp polyps Lumpectomy nasal polyp Tonsillectomy  Family History: Last updated: 01/20/2008 HTN- paternal and maternal grandparents Family History of Prostate CA 1st degree relative <50- paternal grandfather Family History Lung cancer-paternal grandfather  Social History: Last updated: 01/20/2008 Occupation: disabled--RN Married/ swingers Never Smoked Alcohol use-yes Drug use-no Regular exercise-no  Risk Factors: Alcohol Use: <1 (04/17/2009) Caffeine Use: 2 (04/17/2009) Exercise: yes (04/17/2009)  Risk Factors: Smoking Status: never (04/17/2009) Passive Smoke Exposure: no (04/17/2009)  Family History: Reviewed history from 01/20/2008 and no changes required. HTN- paternal and maternal grandparents Family History of Prostate CA 1st degree relative <50- paternal grandfather Family History Lung cancer-paternal grandfather  Social History: Reviewed history from 01/20/2008 and no changes required. Occupation: disabled--RN Married/ swingers Never Smoked Alcohol use-yes Drug use-no Regular exercise-no  Review of Systems  See HPI  Physical Exam  General:  Well-developed,well-nourished,in no acute distress; alert,appropriate and cooperative throughout examination Lungs:  Normal respiratory effort, chest expands symmetrically. Lungs are clear to auscultation, no crackles or wheezes. Heart:  Normal rate and regular rhythm. S1 and S2 normal without gallop, murmur, click, rub or other  extra sounds. Abdomen:  mass palpated under L low ribs,  min tenderness Psych:  Cognition and judgment appear intact. Alert and cooperative with normal attention span and concentration. No apparent delusions, illusions, hallucinations   Impression & Recommendations:  Problem # 1:  CONSTIPATION (ICD-564.00)  Orders: Radiology Referral (Radiology)  Discussed dietary fiber measures and increased water intake.   Problem # 2:  ABDOMINAL PAIN, LEFT UPPER QUADRANT (ICD-789.02)  Orders: Radiology Referral (Radiology) Venipuncture (25366) TLB-BMP (Basic Metabolic Panel-BMET) (80048-METABOL) TLB-CBC Platelet - w/Differential (85025-CBCD) TLB-Hepatic/Liver Function Pnl (80076-HEPATIC) TLB-Amylase (82150-AMYL) TLB-Lipase (83690-LIPASE) Specimen Handling (44034)  Discussed symptom control with the patient.   Complete Medication List: 1)  Lorazepam 1 Mg Tabs (Lorazepam) .... Take 1 tab two times a day as needed 2)  Klonopin 1 Mg Tabs (Clonazepam) .... Take 1 tab at night 3)  Flexeril 10 Mg Tabs (Cyclobenzaprine hcl) .... Take 1 tab two times a day 4)  Lyrica 150 Mg Caps (Pregabalin) .... Take 1 tab two times a day 5)  Vitamin D 74259 Unit Caps (Ergocalciferol) .... Take 1 tab a week- due for recheck on lab 6)  Flonase 50 Mcg/act Susp (Fluticasone propionate) .... 2 sprays each nostril once daily 7)  Prilosec 20 Mg Cpdr (Omeprazole) .... Use one tablet twice daily 8)  Levsin/sl 0.125 Mg Subl (Hyoscyamine sulfate) .Marland Kitchen.. 1-2 by mouth every 4 hours as needed 9)  Proair Hfa 108 (90 Base) Mcg/act Aers (Albuterol sulfate) .... 2 puffs qid as needed 10)  Adderall 10 Mg Tabs (Amphetamine-dextroamphetamine) .Marland Kitchen.. 1 by mouth two times a day 11)  Abilify 2 Mg Tabs (Aripiprazole) .... Take one tablet daily. 12)  Welchol 3.75 Gm Pack (Colesevelam hcl) .Marland Kitchen.. 1 packet a day 13)  Estrace 0.1 Mg/gm Crea (Estradiol) .... As directed 2 x a week 14)  Ambien 10 Mg Tabs (Zolpidem tartrate) .Marland Kitchen.. 1 by mouth at  bedtime as needed 15)  Actonel 150 Mg Tabs (Risedronate sodium) .Marland Kitchen.. 1 by mouth qmo 16)  Astepro 0.15 % Soln (Azelastine hcl) .... 2 spray each nostril once daily   Orders Added: 1)  Radiology Referral [Radiology] 2)  Venipuncture [36415] 3)  TLB-BMP (Basic Metabolic Panel-BMET) [80048-METABOL] 4)  TLB-CBC Platelet - w/Differential [85025-CBCD] 5)  TLB-Hepatic/Liver Function Pnl [80076-HEPATIC] 6)  TLB-Amylase [82150-AMYL] 7)  TLB-Lipase [83690-LIPASE] 8)  Specimen Handling [99000] 9)  Est. Patient Level III [56387]

## 2010-02-12 NOTE — Progress Notes (Signed)
Summary: RX  Phone Note Refill Request Call back at Work Phone [phone] Message from:  Patient on December 10, 2009 11:28 AM  Refills Requested: Medication #1:  RITALIN 10 MG TABS 1 by mouth two times a day.   Dosage confirmed as above?Dosage Confirmed   Supply Requested: 1 month Initial call taken by: Freddy Jaksch,  December 10, 2009 11:28 AM    Prescriptions: RITALIN 10 MG TABS (METHYLPHENIDATE HCL) 1 by mouth two times a day  #60 x 0   Entered by:   Almeta Monas CMA (AAMA)   Authorized by:   Loreen Freud DO   Signed by:   Almeta Monas CMA (AAMA) on 12/10/2009   Method used:   Print then Give to Patient   RxID:   [id number]  Pt aware Rx ready fro pick up.... Almeta Monas CMA Duncan Dull)  December 10, 2009 11:34 AM  Appended Document: RX pt stated that she likes the Ritalin better than the Adderall but thinks she may need a higher dose because it runs out quicker. She wants to stay on Ritalin. Please advise  Appended Document: RX increase to ritalin 20 mg 1 by mouth two times a day #60    Appended Document: RX reprinted RX for 20 mg.

## 2010-02-12 NOTE — Progress Notes (Signed)
Summary: refill  Phone Note Refill Request Message from:  Fax from Pharmacy on August 05, 2009 2:22 PM  Refills Requested: Medication #1:  AMBIEN 10 MG TABS 1 by mouth at BEDTIME as needed [facility] - fax [phone]   Initial call taken by: Okey Regal [place],  August 05, 2009 2:22 PM  Follow-up for Phone Call        rx verbally given for AMBIEN 10 MG TABS (ZOLPIDEM TARTRATE) 1 by mouth at BEDTIME as needed  #30 x 0.Marland KitchenMarland KitchenMarland KitchenFelecia Deloach CMA  August 05, 2009 5:26 PM

## 2010-02-12 NOTE — Progress Notes (Signed)
Summary: RADIOLOGY REFERRAL  ---- Converted from flag ---- ---- 11/07/2009 8:21 AM, Loreen Freud DO wrote: she didn't have any pain in pelvis but she is constipated go go aheadand add pelvic  ---- 11/06/2009 2:51 PM, Magdalen Spatz [facility] wrote: Dr. Laury Axon,  Radiology is questioning whether to add Pelvic w/contrast also since the patient has also been having constipation.  Please advise.  Renee ------------------------------

## 2010-02-12 NOTE — Progress Notes (Signed)
  Phone Note Refill Request   Refills Requested: Medication #1:  ADDERALL 10 MG TABS 1 by mouth two times a day   Last Refilled: 11/09/2008 last ov- 11/16/08  Initial call taken by: Army Fossa CMA,  January 15, 2009 11:05 AM    Prescriptions: ADDERALL 10 MG TABS (AMPHETAMINE-DEXTROAMPHETAMINE) 1 by mouth two times a day  #60 x 0   Entered by:   Army Fossa CMA   Authorized by:   Loreen Freud DO   Signed by:   Army Fossa CMA on 01/15/2009   Method used:   Print then Give to Patient   RxID:   [id number]

## 2010-02-12 NOTE — Progress Notes (Signed)
Summary: NEEDS TWO DOSES OF DIFLUCAN  Phone Note Call from Patient Call back at Home Phone [phone]   Caller: Patient Summary of Call: IF PATIENT TAKES ANTIBIOTIC, SHE ALSO NEEDS TWO DOSES OF DIFLUCAN  PLEASE CALL IN TO [facility]--PLEASE CALL HER TO CONFIRM THAT THIS PRESCRIPTION FOR DIFLUCAN HAS BEEN CALLED IN   Initial call taken by: Jerolyn Shin,  February 18, 2009 4:22 PM  Follow-up for Phone Call        diflucan 150 #2  1 by mouth x1--may repeat in 3 days as needed  Follow-up by: Loreen Freud DO,  February 18, 2009 5:23 PM  Additional Follow-up for Phone Call Additional follow up Details #1::        left message for pt that i sent in rx .Army Fossa CMA  February 18, 2009 5:27 PM     New/Updated Medications: DIFLUCAN 150 MG TABS (FLUCONAZOLE) 1 by mouth x1--may repeat in 3 days as needed Prescriptions: DIFLUCAN 150 MG TABS (FLUCONAZOLE) 1 by mouth x1--may repeat in 3 days as needed  #2 x 0   Entered by:   Army Fossa CMA   Authorized by:   Loreen Freud DO   Signed by:   Army Fossa CMA on 02/18/2009   Method used:   Electronically to        [facility]  [place] [address] (retail)       [address]       [place], [place]  [address]       Ph: [phone]       Fax: [phone]   RxID:   [id number]

## 2010-02-12 NOTE — Progress Notes (Signed)
Summary: NEEDS ADDERRAL RX BY 5PM TODAY  Phone Note Call from Patient Call back at Home Phone [phone] Call back at [facility] - [phone]   Caller: Patient Summary of Call: NEEDS TO PICK UP ADDERRAL 10 PRESCRIPTION BEFORE 5:00 PM ON THURSDAY  SHE WILL BE IN TOWN FROM [place] AT THAT TIME    (WILL PICK UP SPOUSE SAMUEL PRESCRIPTION TOO)--SEE HIS PHONE NOTE Initial call taken by: Jerolyn Shin,  July 25, 2009 1:13 PM  Follow-up for Phone Call        PT AWARE RX READY FOR PICK-UP............Marland KitchenFelecia Deloach CMA  July 25, 2009 2:28 PM     Prescriptions: ADDERALL 10 MG TABS (AMPHETAMINE-DEXTROAMPHETAMINE) 1 by mouth two times a day  #60 x 0   Entered by:   Jeremy Johann CMA   Authorized by:   Loreen Freud DO   Signed by:   Jeremy Johann CMA on 07/25/2009   Method used:   Print then Give to Patient   RxID:   [id number]

## 2010-02-12 NOTE — Letter (Signed)
Summary: Results Follow up Letter   at [facility]  [address] [place], [place] [address]   Phone: [phone]  Fax: [phone]    04/23/2009 MRN: [id number]  [name] [address] [place], [place]  [address]  Dear [name],  The following are the results of your recent test(s):  Test         Result    Pap Smear:        Normal __X___  Not Normal _____ Comments: ______________________________________________________ Cholesterol: LDL(Bad cholesterol):         Your goal is less than:         HDL (Good cholesterol):       Your goal is more than: Comments:  ______________________________________________________ Mammogram:        Normal _____  Not Normal _____ Comments:  ___________________________________________________________________ Hemoccult:        Normal _____  Not normal _______ Comments:    _____________________________________________________________________ Other Tests:    We routinely do not discuss normal results over the telephone.  If you desire a copy of the results, or you have any questions about this information we can discuss them at your next office visit.   Sincerely,    Army Fossa CMA  April 23, 2009 1:55 PM

## 2010-02-12 NOTE — Assessment & Plan Note (Signed)
Summary: cpx//pt will be fasting//lch   Vital Signs:  Patient profile:   60 year old female Height:      59 inches Weight:      143 pounds BMI:     28.99 Pulse rate:   84 / minute BP sitting:   120 / 60  Vitals Entered By: Kandice Hams (April 17, 2009 9:35 AM) CC: cpx with pap   History of Present Illness: Pt here for cpe and pap with labs.  Pt wishes for adrenal adenoma to be check and c/o severe fatigue.     Preventive Screening-Counseling & Management  Alcohol-Tobacco     Alcohol drinks/day: <1     Alcohol type: wine, beer     Smoking Status: never     Passive Smoke Exposure: no  Caffeine-Diet-Exercise     Caffeine use/day: 2     Does Patient Exercise: yes     Type of exercise: gym,  zumba     Exercise (avg: min/session): 30-60     Times/week: 5     Exercise Counseling: to improve exercise regimen  Hep-HIV-STD-Contraception     HIV Risk: no     Dental Visit-last 6 months no     Dental Care Counseling: to seek dental care; no dental care within six months     SBE monthly: yes     SBE Education/Counseling: not indicated; SBE done regularly     Sun Exposure-Excessive: occasionally  Safety-Violence-Falls     Seat Belt Use: 100      Sexual History:  multiple partners currently and swingers.    Current Medications (verified): 1)  Lorazepam 1 Mg Tabs (Lorazepam) .... Take 1 Tab Two Times A Day As Needed 2)  Klonopin 1 Mg Tabs (Clonazepam) .... Take 1 Tab At Night 3)  Flexeril 10 Mg Tabs (Cyclobenzaprine Hcl) .... Take 1 Tab Two Times A Day 4)  Lyrica 150 Mg Caps (Pregabalin) .... Take 1 Tab Two Times A Day 5)  Vitamin D [address] Unit Caps (Ergocalciferol) .... Take 1 Tab A Week- Due For Recheck On Lab 6)  Flonase 50 Mcg/act Susp (Fluticasone Propionate) .... 2 Sprays Each Nostril Once Daily 7)  Prilosec 20 Mg Cpdr (Omeprazole) .... Use One Tablet Twice Daily 8)  Levsin/sl 0.125 Mg Subl (Hyoscyamine Sulfate) .Marland Kitchen.. 1-2 By Mouth Every 4 Hours As Needed 9)  Proair Hfa 108  (90 Base) Mcg/act Aers (Albuterol Sulfate) .... 2 Puffs Qid As Needed 10)  Adderall 10 Mg Tabs (Amphetamine-Dextroamphetamine) .Marland Kitchen.. 1 By Mouth Two Times A Day 11)  Abilify 2 Mg Tabs (Aripiprazole) .... Take One Tablet Daily. 12)  Welchol 3.75 Gm Pack (Colesevelam Hcl) .Marland Kitchen.. 1 Packet A Day 13)  Estrace 0.1 Mg/gm Crea (Estradiol) .... As Directed 2 X A Week 14)  Ambien 10 Mg Tabs (Zolpidem Tartrate) .Marland Kitchen.. 1 By Mouth At Bedtime As Needed 15)  Macrobid 100 Mg Caps (Nitrofurantoin Monohyd Macro) .... Take 1 By Mouth Two Times A Day For 7 Days 16)  Diflucan 150 Mg Tabs (Fluconazole) .Marland Kitchen.. 1 By Mouth X1--May Repeat in 3 Days As Needed  Allergies (verified): 1)  ! Erythromycin 2)  ! Sulfa 3)  ! Ceftin 4)  ! * Skelaxin 5)  ! Tetracycline 6)  ! 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 Carpal tunnel release-- b/L scalp polyps Lumpectomy nasal polyp Tonsillectomy  Family History: Last updated: 01/20/2008 HTN- paternal and maternal grandparents Family History of Prostate CA 1st degree relative <50- paternal grandfather Family History Lung cancer-paternal grandfather  Social History: Last updated: 01/20/2008 Occupation: disabled--RN Married/ swingers Never Smoked Alcohol use-yes Drug use-no Regular exercise-no  Risk Factors: Alcohol Use: <1 (04/17/2009) Caffeine Use: 2 (04/17/2009) Exercise: yes (04/17/2009)  Risk Factors: Smoking Status: never (04/17/2009) Passive Smoke Exposure: no (04/17/2009)  Family History: Reviewed history from 01/20/2008 and no changes required. HTN- paternal and maternal grandparents Family  History of Prostate CA 1st degree relative <50- paternal grandfather Family History Lung cancer-paternal grandfather  Social History: Reviewed history from 01/20/2008 and no changes required. Occupation: disabled--RN Married/ swingers Never Smoked Alcohol use-yes Drug use-no Regular exercise-no Does Patient Exercise:  yes Dental Care w/in 6 mos.:  no Sexual History:  multiple partners currently, swingers  Review of Systems      See HPI General:  Denies chills, fatigue, fever, loss of appetite, malaise, sleep disorder, sweats, weakness, and weight loss. Eyes:  Denies blurring, discharge, double vision, eye irritation, eye pain, halos, itching, light sensitivity, red eye, vision loss-1 eye, and vision loss-both eyes; optho-- q1y. ENT:  Denies decreased hearing, difficulty swallowing, ear discharge, earache, hoarseness, nasal congestion, nosebleeds, postnasal drainage, ringing in ears, sinus pressure, and sore throat. CV:  Denies bluish discoloration of lips or nails, chest pain or discomfort, difficulty breathing at night, difficulty breathing while lying down, fainting, fatigue, leg cramps with exertion, lightheadness, near fainting, palpitations, shortness of breath with exertion, swelling of feet, swelling of hands, and weight gain. Resp:  Denies chest discomfort, chest pain with inspiration, cough, coughing up blood, excessive snoring, hypersomnolence, morning headaches, pleuritic, shortness of breath, sputum productive, and wheezing. GI:  Denies abdominal pain, bloody stools, change in bowel habits, constipation, dark tarry stools, diarrhea, excessive appetite, gas, hemorrhoids, indigestion, loss of appetite, nausea, vomiting, vomiting blood, and yellowish skin color. GU:  Denies abnormal vaginal bleeding, decreased libido, discharge, dysuria, genital sores, hematuria, incontinence, nocturia, urinary frequency, and urinary hesitancy. MS:  Denies joint pain, joint redness, joint swelling,  loss of strength, low back pain, mid back pain, muscle aches, muscle , cramps, muscle weakness, stiffness, and thoracic pain. Derm:  Denies changes in color of skin, changes in nail beds, dryness, excessive perspiration, flushing, hair loss, insect bite(s), itching, lesion(s), poor wound healing, and rash. Neuro:  Denies brief paralysis, difficulty with concentration, disturbances in coordination, falling down, headaches, inability to speak, memory loss, numbness, poor balance, seizures, sensation of room spinning, tingling, tremors, visual disturbances, and weakness. Psych:  Denies alternate hallucination ( auditory/visual), anxiety, depression, easily angered, easily tearful, irritability, mental problems, panic attacks, sense of great danger, suicidal thoughts/plans, thoughts of violence, unusual visions or sounds, and thoughts /plans of harming others. Endo:  Denies cold intolerance, excessive hunger, excessive thirst, excessive urination, heat intolerance, polyuria, and weight change. Heme:  Denies abnormal bruising, bleeding, enlarge lymph nodes, fevers, pallor, and skin discoloration. Allergy:  Denies hives or rash, itching eyes, persistent infections, seasonal allergies, and sneezing.  Physical Exam  General:  Well-developed,well-nourished,in no acute distress; alert,appropriate and cooperative throughout examination Head:  Normocephalic and atraumatic without obvious abnormalities. No apparent alopecia or balding. Eyes:  pupils equal, pupils round, pupils reactive to light, and no injection.   Ears:  External ear exam shows no significant lesions or deformities.  Otoscopic examination reveals clear canals,  tympanic membranes are intact bilaterally without bulging, retraction, inflammation or discharge. Hearing is grossly normal bilaterally. Nose:  External nasal examination shows no deformity or inflammation. Nasal mucosa are pink and moist without lesions or exudates. Mouth:  Oral mucosa and  oropharynx without lesions or exudates.  Teeth in good repair. Neck:  No deformities, masses, or tenderness noted.no carotid bruits.   Chest Wall:  No deformities, masses, or tenderness noted. Breasts:  No mass, nodules, thickening, tenderness, bulging, retraction, inflamation, nipple discharge or skin changes noted.   Lungs:  Normal respiratory effort, chest expands symmetrically. Lungs are clear to auscultation, no crackles or wheezes. Heart:  normal rate and no murmur.   Abdomen:  Bowel sounds positive,abdomen soft and non-tender without masses, organomegaly or hernias noted. Rectal:  No external abnormalities noted. Normal sphincter tone. No rectal masses or tenderness. Genitalia:  Pelvic Exam:        External: normal female genitalia without lesions or masses        Vagina: normal without lesions or masses        Cervix: normal without lesions or masses        Adnexa: normal bimanual exam without masses or fullness        Uterus: normal by palpation        Pap smear: performed Msk:  normal ROM, no joint tenderness, no joint swelling, no joint warmth, no redness over joints, no joint deformities, no joint instability, and no crepitation.   Pulses:  R posterior tibial normal, R dorsalis pedis normal, and R carotid normal.   Extremities:  No clubbing, cyanosis, edema, or deformity noted with normal full range of motion of all joints.   Neurologic:  No cranial nerve deficits noted. Station and gait are normal. Plantar reflexes are down-going bilaterally. DTRs are symmetrical throughout. Sensory, motor and coordinative functions appear intact. Skin:  Intact without suspicious lesions or rashes Cervical Nodes:  No lymphadenopathy noted Axillary Nodes:  No palpable lymphadenopathy Psych:  Cognition and judgment appear intact. Alert and cooperative with normal attention span and concentration. No apparent delusions, illusions, hallucinations   Impression & Recommendations:  Problem # 1:   PREVENTIVE HEALTH CARE (ICD-V70.0) ghm utd Orders: Radiology Referral (Radiology) Venipuncture (95621) TLB-Lipid Panel (80061-LIPID) TLB-BMP (Basic Metabolic Panel-BMET) (80048-METABOL) TLB-CBC Platelet - w/Differential (85025-CBCD) TLB-Hepatic/Liver Function Pnl (80076-HEPATIC) TLB-TSH (Thyroid Stimulating Hormone) (84443-TSH)  Problem # 2:  HYPERLIPIDEMIA (ICD-272.4)  Her updated medication list for this problem includes:    Welchol 3.75 Gm Pack (Colesevelam hcl) .Marland Kitchen... 1 packet a day  Orders: Venipuncture (30865) TLB-Lipid Panel (80061-LIPID) TLB-BMP (Basic Metabolic Panel-BMET) (80048-METABOL) TLB-CBC Platelet - w/Differential (85025-CBCD) TLB-Hepatic/Liver Function Pnl (80076-HEPATIC) TLB-TSH (Thyroid Stimulating Hormone) (84443-TSH)  Labs Reviewed: SGOT: 29 (06/25/2008)   SGPT: 20 (06/25/2008)   HDL:38.10 (06/25/2008), 50 (01/20/2008)  LDL:124 (01/20/2008)  Chol:242 (06/25/2008), 233 (01/20/2008)  Trig:300.0 (06/25/2008), 294 (01/20/2008)  Problem # 3:  ADD (ICD-314.00)  Problem # 4:  ASTHMA (ICD-493.90)  Her updated medication list for this problem includes:    Proair Hfa 108 (90 Base) Mcg/act Aers (Albuterol sulfate) .Marland Kitchen... 2 puffs qid as needed  Problem # 5:  ESOPHAGEAL STRICTURE (ICD-530.3)  Problem # 6:  ESOPHAGEAL STRICTURE (ICD-530.3)  Problem # 7:  RHEUMATOID ARTHRITIS (ICD-714.0)  Complete Medication List: 1)  Lorazepam 1 Mg Tabs (Lorazepam) .... Take 1 tab two times a day as needed 2)  Klonopin 1 Mg Tabs (Clonazepam) .... Take 1 tab at night 3)  Flexeril 10 Mg Tabs (Cyclobenzaprine hcl) .... Take 1  tab two times a day 4)  Lyrica 150 Mg Caps (Pregabalin) .... Take 1 tab two times a day 5)  Vitamin D 47829 Unit Caps (Ergocalciferol) .... Take 1 tab a week- due for recheck on lab 6)  Flonase 50 Mcg/act Susp (Fluticasone propionate) .... 2 sprays each nostril once daily 7)  Prilosec 20 Mg Cpdr (Omeprazole) .... Use one tablet twice daily 8)  Levsin/sl 0.125  Mg Subl (Hyoscyamine sulfate) .Marland Kitchen.. 1-2 by mouth every 4 hours as needed 9)  Proair Hfa 108 (90 Base) Mcg/act Aers (Albuterol sulfate) .... 2 puffs qid as needed 10)  Adderall 10 Mg Tabs (Amphetamine-dextroamphetamine) .Marland Kitchen.. 1 by mouth two times a day 11)  Abilify 2 Mg Tabs (Aripiprazole) .... Take one tablet daily. 12)  Welchol 3.75 Gm Pack (Colesevelam hcl) .Marland Kitchen.. 1 packet a day 13)  Estrace 0.1 Mg/gm Crea (Estradiol) .... As directed 2 x a week 14)  Ambien 10 Mg Tabs (Zolpidem tartrate) .Marland Kitchen.. 1 by mouth at bedtime as needed 15)  Macrobid 100 Mg Caps (Nitrofurantoin monohyd macro) .... Take 1 by mouth two times a day for 7 days 16)  Diflucan 150 Mg Tabs (Fluconazole) .Marland Kitchen.. 1 by mouth x1--may repeat in 3 days as needed  Other Orders: T-Vitamin D (25-Hydroxy) (56213-08657) TLB-Cortisol (82533-CORT) T-Urine 24 Hr. Metanephrines [phone]) T-Urine 24 Hr. Catecholamines [phone])       Laboratory Results   Urine Tests    Routine Urinalysis   Color: yellow Appearance: Clear Glucose: negative   (Normal Range: Negative) Bilirubin: negative   (Normal Range: Negative) Ketone: negative   (Normal Range: Negative) Spec. Gravity: 1.010   (Normal Range: 1.003-1.035) Blood: negative   (Normal Range: Negative) pH: 6.0   (Normal Range: 5.0-8.0) Protein: negative   (Normal Range: Negative) Urobilinogen: 0.2   (Normal Range: 0-1) Nitrite: negative   (Normal Range: Negative) Leukocyte Esterace: negative   (Normal Range: Negative)

## 2010-02-12 NOTE — Medication Information (Signed)
Summary: Approval for Adderall/[facility]  Approval for Adderall/[facility]   Imported By: Lanelle Bal 08/27/2009 08:19:56  _____________________________________________________________________  External Attachment:    Type:   Image     Comment:   External Document

## 2010-02-12 NOTE — Progress Notes (Signed)
Summary: Lyrica Refill   Phone Note Call from Patient   Summary of Call: Pt called and stated that she lost her hardcopy of her rx for lyrica. She states she has lost it. The pharmacy does not have on file. Is this okay to fill for the pt?  Initial call taken by: Army Fossa CMA,  January 21, 2009 12:50 PM  Follow-up for Phone Call        yes Follow-up by: Loreen Freud DO,  January 21, 2009 12:52 PM    Prescriptions: LYRICA 150 MG CAPS (PREGABALIN) take 1 tab two times a day  #60 x 3   Entered by:   Army Fossa CMA   Authorized by:   Loreen Freud DO   Signed by:   Army Fossa CMA on 01/21/2009   Method used:   Printed then faxed to ...       [facility] [address] [facility] (retail)       [address]       [place], [place]  [address]       Ph: [phone]       Fax: [phone]   RxID:   [id number]

## 2010-02-12 NOTE — Medication Information (Signed)
Summary: Letter Regarding Meds/[facility]them  Letter Regarding Meds/[facility]   Imported By: Lanelle Bal 03/08/2009 11:21:07  _____________________________________________________________________  External Attachment:    Type:   Image     Comment:   External Document

## 2010-02-12 NOTE — Progress Notes (Signed)
Summary: urine culture results  Phone Note Outgoing Call   Call placed by: [facility] CMA,  February 18, 2009 3:21 PM Summary of Call: left message with pt husband to have pt stopped cipro and to start new med..rx sent to pharmacy..................................Marland KitchenFelecia Deloach CMA  February 18, 2009 3:21 PM     New/Updated Medications: MACROBID 100 MG CAPS (NITROFURANTOIN MONOHYD MACRO) take 1 by mouth two times a day for 7 days Prescriptions: MACROBID 100 MG CAPS (NITROFURANTOIN MONOHYD MACRO) take 1 by mouth two times a day for 7 days  #14 x 0   Entered by:   Jeremy Johann CMA   Authorized by:   Loreen Freud DO   Signed by:   Jeremy Johann CMA on 02/18/2009   Method used:   Faxed to ...       [facility] [address] [facility] ([facility]ail)       [address]       [place], [place]  [address]       Ph: [phone]       Fax: [phone]   RxID:   [id number]

## 2010-02-12 NOTE — Progress Notes (Signed)
Summary: refill - adderall  Phone Note Refill Request Message from:  Fax from Pharmacy on November 11, 2009 11:34 AM  Refills Requested: Medication #1:  ADDERALL 10 MG TABS 1 by mouth two times a day patient wants to pick up this afternoon - [id number]  Initial call taken by: Okey Regal Spring,  November 11, 2009 11:35 AM  Follow-up for Phone Call        advised pt that Addrrall is not available, Pt is ok with trying Ritalin...Marland KitchenMarland KitchenPlease advise change Follow-up by: Almeta Monas CMA Duncan Dull),  November 11, 2009 12:03 PM  Additional Follow-up for Phone Call Additional follow up Details #1::        ritalin 10 mg #60  1 by mouth two times a day  Additional Follow-up by: Loreen Freud DO,  November 11, 2009 1:03 PM    New/Updated Medications: RITALIN 10 MG TABS (METHYLPHENIDATE HCL) 1 by mouth two times a day Prescriptions: RITALIN 10 MG TABS (METHYLPHENIDATE HCL) 1 by mouth two times a day  #60 x 0   Entered by:   Almeta Monas CMA (AAMA)   Authorized by:   Loreen Freud DO   Signed by:   Almeta Monas CMA (AAMA) on 11/11/2009   Method used:   Print then Give to Patient   RxID:   [id number]

## 2010-02-12 NOTE — Medication Information (Signed)
Summary: Approval for Omeprazole/[facility]  Approval for Omeprazole/[facility]   Imported By: Lanelle Bal 05/30/2009 13:23:52  _____________________________________________________________________  External Attachment:    Type:   Image     Comment:   External Document

## 2010-02-12 NOTE — Progress Notes (Signed)
  Phone Note Refill Request   Refills Requested: Medication #1:  ADDERALL 10 MG TABS 1 by mouth two times a day will pick up monday. Army Fossa CMA  April 01, 2009 8:07 AM      Prescriptions: ADDERALL 10 MG TABS (AMPHETAMINE-DEXTROAMPHETAMINE) 1 by mouth two times a day  #60 x 0   Entered by:   Army Fossa CMA   Authorized by:   Loreen Freud DO   Signed by:   Army Fossa CMA on 04/01/2009   Method used:   Print then Give to Patient   RxID:   [id number]

## 2010-02-12 NOTE — Progress Notes (Signed)
Summary: Refill Request  Phone Note Refill Request Call back at [phone] Message from:  Pharmacy on December 04, 2009 3:12 PM  Refills Requested: Medication #1:  LORAZEPAM 1 MG TABS Take 1 tab two times a day as needed   Dosage confirmed as above?Dosage Confirmed   Supply Requested: 1 month   Last Refilled: 10/07/2009  Medication #2:  AMBIEN 10 MG TABS 1 by mouth at BEDTIME as needed   Dosage confirmed as above?Dosage Confirmed   Supply Requested: 1 month   Last Refilled: 09/23/2009 [facility] [address]  Next Appointment Scheduled: none Initial call taken by: Harold Barban,  December 04, 2009 3:12 PM  Follow-up for Phone Call        spk with pharmacy and they never rcv'd Klonopin Or Ambien, I called them both in to pharmacy with no refills.Marland KitchenMarland KitchenNew request is for Lorazepam last filled 03/28/09.Marland KitchenMarland KitchenMarland KitchenMarland Kitchenplease advise Follow-up by: Almeta Monas CMA Duncan Dull),  December 04, 2009 3:46 PM  Additional Follow-up for Phone Call Additional follow up Details #1::        refill x1 Additional Follow-up by: Loreen Freud DO,  December 05, 2009 10:41 AM    Prescriptions: LORAZEPAM 1 MG TABS (LORAZEPAM) Take 1 tab two times a day as needed  #60 x 0   Entered by:   Almeta Monas CMA (AAMA)   Authorized by:   Loreen Freud DO   Signed by:   Almeta Monas CMA (AAMA) on 12/09/2009   Method used:   Printed then faxed to ...       [facility] [address] [facility]       [address]       [place], [place]  [address]       Ph: [phone]       Fax: [phone]   RxID:   [id number]

## 2010-02-12 NOTE — Progress Notes (Signed)
Summary: ambien  Phone Note Call from Patient   Summary of Call: Pt is out of town and would like to know if we can call in her Ambien to [facility] [phone]) it was last filled on 12/25/08, last ov was 11/16/08. Army Fossa CMA  February 11, 2009 3:59 PM   Follow-up for Phone Call        refill x1 Follow-up by: Loreen Freud DO,  February 11, 2009 4:47 PM  Additional Follow-up for Phone Call Additional follow up Details #1::        done, pt is aware. Army Fossa CMA  February 11, 2009 4:53 PM     New/Updated Medications: AMBIEN 10 MG TABS (ZOLPIDEM TARTRATE) 1 by mouth at BEDTIME as needed Prescriptions: AMBIEN 10 MG TABS (ZOLPIDEM TARTRATE) 1 by mouth at BEDTIME as needed  #30 x 0   Entered by:   Army Fossa CMA   Authorized by:   Loreen Freud DO   Signed by:   Army Fossa CMA on 02/11/2009   Method used:   Telephoned to ...       [facility] [address] [facility]       [address]       [place], [place]  [address]       Ph: [phone]       Fax: [phone]   RxID:   [id number]

## 2010-02-12 NOTE — Procedures (Signed)
Summary: EGD/[facility]  EGD/[facility]   Imported By: Lanelle Bal 02/15/2009 12:43:27  _____________________________________________________________________  External Attachment:    Type:   Image     Comment:   External Document

## 2010-02-12 NOTE — Progress Notes (Signed)
Summary: REFILL  Phone Note Refill Request Message from:  Pharmacy on November 06, 2009 8:59 AM  Refills Requested: Medication #1:  AMBIEN 10 MG TABS 1 by mouth at BEDTIME as needed   Dosage confirmed as above?Dosage Confirmed   Supply Requested: 1 month   Last Refilled: 09/30/2009 [facility] [address] [place]  Next Appointment Scheduled: NONE Initial call taken by: Lavell Islam,  November 06, 2009 9:00 AM  Follow-up for Phone Call        last filled 09/30/09 and seen 11/05/09 please advise Follow-up by: Almeta Monas CMA Duncan Dull),  November 06, 2009 1:54 PM  Additional Follow-up for Phone Call Additional follow up Details #1::        refill x1---but please remind pt I do not want her taking this everyday- Additional Follow-up by: Loreen Freud DO,  November 06, 2009 2:02 PM    Prescriptions: AMBIEN 10 MG TABS (ZOLPIDEM TARTRATE) 1 by mouth at BEDTIME as needed  #30 x 0   Entered by:   Almeta Monas CMA (AAMA)   Authorized by:   Loreen Freud DO   Signed by:   Almeta Monas CMA (AAMA) on 11/06/2009   Method used:   Printed then faxed to ...       [facility]  [address] [facility]       [address]       [place], [place]  [address]       Ph: [phone]       Fax: [phone]   RxID:   [id number]  pt aware....  Almeta Monas CMA Duncan Dull)  November 06, 2009 4:49 PM

## 2010-02-12 NOTE — Assessment & Plan Note (Signed)
Summary: disscuss med/lab/cbs   Vital Signs:  Patient profile:   60 year old female Weight:      140.6 pounds Temp:     97.2 degrees F oral Pulse rate:   76 / minute Pulse rhythm:   regular BP sitting:   130 / 76  (right arm) Cuff size:   regular  Vitals Entered By: Almeta Monas CMA Duncan Dull) (October 24, 2009 10:35 AM) CC: here to discuss meds    History of Present Illness: Pt here for f/u and med refills.     Hyperlipidemia follow-up      This is a 60 year old woman who presents for Hyperlipidemia follow-up.  The patient denies muscle aches, GI upset, abdominal pain, flushing, itching, constipation, diarrhea, and fatigue.  The patient denies the following symptoms: chest pain/pressure, exercise intolerance, dypsnea, palpitations, syncope, and pedal edema.  Compliance with medications (by patient report) has been poor.  Pt did not pick up welchol because it was too expensive.  Dietary compliance has been good.  The patient reports exercising occasionally.  Adjunctive measures currently used by the patient include ASA and weight reduction.    Current Medications (verified): 1)  Lorazepam 1 Mg Tabs (Lorazepam) .... Take 1 Tab Two Times A Day As Needed 2)  Klonopin 1 Mg Tabs (Clonazepam) .... Take 1 Tab At Night 3)  Flexeril 10 Mg Tabs (Cyclobenzaprine Hcl) .... Take 1 Tab Two Times A Day 4)  Lyrica 150 Mg Caps (Pregabalin) .... Take 1 Tab Two Times A Day 5)  Vitamin D 98119 Unit Caps (Ergocalciferol) .... Take 1 Tab A Week- Due For Recheck On Lab 6)  Flonase 50 Mcg/act Susp (Fluticasone Propionate) .... 2 Sprays Each Nostril Once Daily 7)  Prilosec 20 Mg Cpdr (Omeprazole) .... Use One Tablet Twice Daily 8)  Levsin/sl 0.125 Mg Subl (Hyoscyamine Sulfate) .Marland Kitchen.. 1-2 By Mouth Every 4 Hours As Needed 9)  Proair Hfa 108 (90 Base) Mcg/act Aers (Albuterol Sulfate) .... 2 Puffs Qid As Needed 10)  Adderall 10 Mg Tabs (Amphetamine-Dextroamphetamine) .Marland Kitchen.. 1 By Mouth Two Times A Day 11)  Abilify 2  Mg Tabs (Aripiprazole) .... Take One Tablet Daily. 12)  Welchol 3.75 Gm Pack (Colesevelam Hcl) .Marland Kitchen.. 1 Packet A Day 13)  Estrace 0.1 Mg/gm Crea (Estradiol) .... As Directed 2 X A Week 14)  Ambien 10 Mg Tabs (Zolpidem Tartrate) .Marland Kitchen.. 1 By Mouth At Bedtime As Needed 15)  Actonel 150 Mg Tabs (Risedronate Sodium) .Marland Kitchen.. 1 By Mouth Qmo 16)  Astepro 0.15 % Soln (Azelastine Hcl) .... 2 Spray Each Nostril Once Daily 17)  Macrobid 100 Mg Caps (Nitrofurantoin Monohyd Macro) .Marland Kitchen.. 1 By Mouth Two Times A Day  Allergies (verified): 1)  ! Erythromycin 2)  ! Sulfa 3)  ! Ceftin 4)  ! * Skelaxin 5)  ! Tetracycline 6)  ! Zocor  Family History: Reviewed history from 01/20/2008 and no changes required. HTN- paternal and maternal grandparents Family History of Prostate CA 1st degree relative <50- paternal grandfather Family History Lung cancer-paternal grandfather  Social History: Reviewed history from 01/20/2008 and no changes required. Occupation: disabled--RN Married/ [employer] Never Smoked Alcohol use-yes Drug use-no Regular exercise-no  Review of Systems      See HPI  Physical Exam  General:  Well-developed,well-nourished,in no acute distress; alert,appropriate and cooperative throughout examination Lungs:  Normal respiratory effort, chest expands symmetrically. Lungs are clear to auscultation, no crackles or wheezes. Heart:  normal rate and no murmur.   Extremities:  No clubbing, cyanosis, edema,  or deformity noted with normal full range of motion of all joints.   Psych:  Oriented X3 and normally interactive.     Impression & Recommendations:  Problem # 1:  HYPERLIPIDEMIA (ICD-272.4)  Her updated medication list for this problem includes:    Welchol 3.75 Gm Pack (Colesevelam hcl) .Marland Kitchen... 1 packet a day  Orders: Venipuncture (13086) TLB-Lipid Panel (80061-LIPID) TLB-BMP (Basic Metabolic Panel-BMET) (80048-METABOL) TLB-CBC Platelet - w/Differential (85025-CBCD) TLB-Hepatic/Liver  Function Pnl (80076-HEPATIC) TLB-TSH (Thyroid Stimulating Hormone) (84443-TSH) T-Vitamin D (25-Hydroxy) (57846-96295) T-HIV Antibody  (Reflex) (28413-24401) Specimen Handling (02725)  Problem # 2:  SEXUAL ACTIVITY, HIGH RISK (ICD-V69.2)  Orders: T-HIV Antibody  (Reflex) (36644-03474) Specimen Handling (25956)  Problem # 3:  OTHER OSTEOPOROSIS (ICD-733.09)  Her updated medication list for this problem includes:    Vitamin D 38756 Unit Caps (Ergocalciferol) .Marland Kitchen... Take 1 tab a week- due for recheck on lab    Actonel 150 Mg Tabs (Risedronate sodium) .Marland Kitchen... 1 by mouth qmo  Orders: Venipuncture (43329) TLB-Lipid Panel (80061-LIPID) TLB-BMP (Basic Metabolic Panel-BMET) (80048-METABOL) TLB-CBC Platelet - w/Differential (85025-CBCD) TLB-Hepatic/Liver Function Pnl (80076-HEPATIC) TLB-TSH (Thyroid Stimulating Hormone) (84443-TSH) T-Vitamin D (25-Hydroxy) (51884-16606) T-HIV Antibody  (Reflex) (30160-10932) Specimen Handling (35573)  Problem # 4:  UNSPECIFIED VITAMIN D DEFICIENCY (ICD-268.9)  Orders: Venipuncture (22025) TLB-Lipid Panel (80061-LIPID) TLB-BMP (Basic Metabolic Panel-BMET) (80048-METABOL) TLB-CBC Platelet - w/Differential (85025-CBCD) TLB-Hepatic/Liver Function Pnl (80076-HEPATIC) TLB-TSH (Thyroid Stimulating Hormone) (84443-TSH) T-Vitamin D (25-Hydroxy) (42706-23762) T-HIV Antibody  (Reflex) (83151-76160) Specimen Handling (73710)  Problem # 5:  ASTHMA (ICD-493.90)  Her updated medication list for this problem includes:    Proair Hfa 108 (90 Base) Mcg/act Aers (Albuterol sulfate) .Marland Kitchen... 2 puffs qid as needed  Complete Medication List: 1)  Lorazepam 1 Mg Tabs (Lorazepam) .... Take 1 tab two times a day as needed 2)  Klonopin 1 Mg Tabs (Clonazepam) .... Take 1 tab at night 3)  Flexeril 10 Mg Tabs (Cyclobenzaprine hcl) .... Take 1 tab two times a day 4)  Lyrica 150 Mg Caps (Pregabalin) .... Take 1 tab two times a day 5)  Vitamin D 62694 Unit Caps (Ergocalciferol)  .... Take 1 tab a week- due for recheck on lab 6)  Flonase 50 Mcg/act Susp (Fluticasone propionate) .... 2 sprays each nostril once daily 7)  Prilosec 20 Mg Cpdr (Omeprazole) .... Use one tablet twice daily 8)  Levsin/sl 0.125 Mg Subl (Hyoscyamine sulfate) .Marland Kitchen.. 1-2 by mouth every 4 hours as needed 9)  Proair Hfa 108 (90 Base) Mcg/act Aers (Albuterol sulfate) .... 2 puffs qid as needed 10)  Adderall 10 Mg Tabs (Amphetamine-dextroamphetamine) .Marland Kitchen.. 1 by mouth two times a day 11)  Abilify 2 Mg Tabs (Aripiprazole) .... Take one tablet daily. 12)  Welchol 3.75 Gm Pack (Colesevelam hcl) .Marland Kitchen.. 1 packet a day 13)  Estrace 0.1 Mg/gm Crea (Estradiol) .... As directed 2 x a week 14)  Ambien 10 Mg Tabs (Zolpidem tartrate) .Marland Kitchen.. 1 by mouth at bedtime as needed 15)  Actonel 150 Mg Tabs (Risedronate sodium) .Marland Kitchen.. 1 by mouth qmo 16)  Astepro 0.15 % Soln (Azelastine hcl) .... 2 spray each nostril once daily 17)  Macrobid 100 Mg Caps (Nitrofurantoin monohyd macro) .Marland Kitchen.. 1 by mouth two times a day Prescriptions: PROAIR HFA 108 (90 BASE) MCG/ACT AERS (ALBUTEROL SULFATE) 2 puffs qid as needed  #1 x 2   Entered and Authorized by:   Loreen Freud DO   Signed by:   Loreen Freud DO on 10/24/2009   Method used:   Electronically to  [facility]  [address] (retail)       [address]       [place], [place]  [address]       Ph: [phone]       Fax: [phone]   RxID:   [id number] MACROBID 100 MG CAPS (NITROFURANTOIN MONOHYD MACRO) 1 by mouth two times a day  #14 x 0   Entered and Authorized by:   Loreen Freud DO   Signed by:   Loreen Freud DO on 10/24/2009   Method used:   Electronically to        [facility]  [address] (retail)       [address]       [place], [place]  [address]       Ph: [phone]       Fax: [phone]   RxID:   [id number] ASTEPRO 0.15 % SOLN (AZELASTINE HCL) 2 spray each nostril once daily  #1 x 5   Entered and Authorized by:   Loreen Freud  DO   Signed by:   Loreen Freud DO on 10/24/2009   Method used:   Electronically to        [facility]  [address] (retail)       [address]       [place], [place]  [address]       Ph: [phone]       Fax: [phone]   RxID:   [id number] FLONASE 50 MCG/ACT SUSP (FLUTICASONE PROPIONATE) 2 sprays each nostril once daily  #1 x 5   Entered and Authorized by:   Loreen Freud DO   Signed by:   Loreen Freud DO on 10/24/2009   Method used:   Electronically to        [facility]  [address] (retail)       [address]       [place], [place]  [address]       Ph: [phone]       Fax: [phone]   RxID:   [id number]

## 2010-02-12 NOTE — Progress Notes (Signed)
Summary: URINE CULTURE RESULTS  Phone Note Call from Patient Call back at Home Phone [phone]   Caller: Patient Call For: Loreen Freud DO Reason for Call: Talk to Nurse, Lab or Test Results Summary of Call: PATIENT CALLING, REQUESTING TO KNOW WHAT DID HER URINE CULTURE RESULTS SHOW?  OK TO LEAVE INFORMATION ON MACHINE.   Initial call taken by: Magdalen Spatz [facility],  February 20, 2009 2:06 PM  Follow-up for Phone Call        Spoke with pt- and discussed with her. Army Fossa CMA  February 20, 2009 2:15 PM

## 2010-02-12 NOTE — Letter (Signed)
Summary: [facility]  [facility]   Imported By: Lanelle Bal 07/18/2009 10:30:05  _____________________________________________________________________  External Attachment:    Type:   Image     Comment:   External Document

## 2010-02-12 NOTE — Progress Notes (Signed)
Summary: REFILL REQUEST  Phone Note Refill Request Call back at [phone] Message from:  Pharmacy on August 06, 2009 8:43 AM  Refills Requested: Medication #1:  ADDERALL 10 MG TABS 1 by mouth two times a day   Dosage confirmed as above?Dosage Confirmed   Supply Requested: 1 month   Last Refilled: 07/25/2009 PRIOR AUTHORIZATION: [phone] PT ID: [id number] [address] [place]  Initial call taken by: Lavell Islam,  August 06, 2009 8:44 AM  Follow-up for Phone Call        form faxed to [phone]. Lucious Groves CMA  August 06, 2009 3:50 PM   Additional Follow-up for Phone Call Additional follow up Details #1::        I see in the computer that this was completed. Closed phone note. Additional Follow-up by: Lucious Groves CMA,  August 14, 2009 2:04 PM

## 2010-02-12 NOTE — Assessment & Plan Note (Signed)
Summary: UTI//FD   Vital Signs:  Patient profile:   60 year old female Weight:      141.25 pounds Temp:     98.3 degrees F oral Pulse rate:   86 / minute Pulse rhythm:   regular BP sitting:   122 / 80  (left arm) Cuff size:   regular  Vitals Entered By: Army Fossa CMA (February 15, 2009 1:54 PM) CC: Pt c/o UTI symptoms- burning when urninating and frequency and lower back pain, Dysuria   History of Present Illness:  Dysuria      This is a 60 year old woman who presents with Dysuria.  The symptoms began 1 day ago.  The patient complains of burning with urination and urinary frequency, but denies urgency, hematuria, vaginal discharge, vaginal itching, vaginal sores, and penile discharge.  Associated symptoms include flank pain.  The patient denies the following associated symptoms: nausea, vomiting, fever, shaking chills, abdominal pain, back pain, pelvic pain, and arthralgias.  The patient denies the following risk factors: diabetes, prior antibiotics, immunosuppression, history of GU anomaly, history of pyelonephritis, pregnancy, history of STD, and analgesic abuse.    Allergies: 1)  ! Erythromycin 2)  ! Sulfa 3)  ! Ceftin 4)  ! * Skelaxin 5)  ! Tetracycline 6)  ! Zocor  Physical Exam  General:  Well-developed,well-nourished,in no acute distress; alert,appropriate and cooperative throughout examination Psych:  Cognition and judgment appear intact. Alert and cooperative with normal attention span and concentration. No apparent delusions, illusions, hallucinations   Impression & Recommendations:  Problem # 1:  UTI (ICD-599.0)  Her updated medication list for this problem includes:    Cipro 500 Mg Tabs (Ciprofloxacin hcl) .Marland Kitchen... 1 by mouth two times a day  Orders: T-Culture, Urine (21308-65784) UA Dipstick w/o Micro (manual) (69629)  Encouraged to push clear liquids, get enough rest, and take acetaminophen as needed. To be seen in 10 days if no improvement, sooner if  worse.  Complete Medication List: 1)  Lorazepam 1 Mg Tabs (Lorazepam) .... Take 1 tab two times a day as needed 2)  Klonopin 1 Mg Tabs (Clonazepam) .... Take 1 tab at night 3)  Flexeril 10 Mg Tabs (Cyclobenzaprine hcl) .... Take 1 tab two times a day 4)  Lyrica 150 Mg Caps (Pregabalin) .... Take 1 tab two times a day 5)  Vitamin D 52841 Unit Caps (Ergocalciferol) .... Take 1 tab a week 6)  Flonase 50 Mcg/act Susp (Fluticasone propionate) .... 2 sprays each nostril once daily 7)  Prilosec 20 Mg Cpdr (Omeprazole) .... Use one tablet twice daily 8)  Levsin/sl 0.125 Mg Subl (Hyoscyamine sulfate) .Marland Kitchen.. 1-2 by mouth every 4 hours as needed 9)  Proair Hfa 108 (90 Base) Mcg/act Aers (Albuterol sulfate) .... 2 puffs qid as needed 10)  Adderall 10 Mg Tabs (Amphetamine-dextroamphetamine) .Marland Kitchen.. 1 by mouth two times a day 11)  Abilify 2 Mg Tabs (Aripiprazole) .... Take one tablet daily. 12)  Welchol 3.75 Gm Pack (Colesevelam hcl) .Marland Kitchen.. 1 packet a day 13)  Estrace 0.1 Mg/gm Crea (Estradiol) .... As directed 2 x a week 14)  Ambien 10 Mg Tabs (Zolpidem tartrate) .Marland Kitchen.. 1 by mouth at bedtime as needed 15)  Cipro 500 Mg Tabs (Ciprofloxacin hcl) .Marland Kitchen.. 1 by mouth two times a day Prescriptions: CIPRO 500 MG TABS (CIPROFLOXACIN HCL) 1 by mouth two times a day  #10 x 0   Entered and Authorized by:   Loreen Freud DO   Signed by:   Loreen Freud  DO on 02/15/2009   Method used:   Electronically to        [facility]  [address] (retail)       [address]       [place], [place]  [address]       Ph: [phone]       Fax: [phone]   RxID:   [id number]   Laboratory Results   Urine Tests    Routine Urinalysis   Color: yellow Appearance: Clear Glucose: negative   (Normal Range: Negative) Bilirubin: negative   (Normal Range: Negative) Ketone: negative   (Normal Range: Negative) Spec. Gravity: 1.020   (Normal Range: 1.003-1.035) Blood: moderate   (Normal Range: Negative) pH: 6.5    (Normal Range: 5.0-8.0) Protein: negative   (Normal Range: Negative) Urobilinogen: 0.2   (Normal Range: 0-1) Nitrite: positive   (Normal Range: Negative) Leukocyte Esterace: negative   (Normal Range: Negative)    Comments: Army Fossa CMA  February 15, 2009 2:09 PM

## 2010-02-12 NOTE — Procedures (Signed)
Summary: Colonoscopy/[facility]  Colonoscopy/[facility]   Imported By: Lanelle Bal 02/13/2009 11:59:50  _____________________________________________________________________  External Attachment:    Type:   Image     Comment:   External Document  Appended Document: Colonoscopy/[facility]    Clinical Lists Changes  Observations: Added new observation of COLONNXTDUE: 02/07/2019 (02/13/2009 14:59) Added new observation of FLUVAXDUE: 11/12/2009 (02/13/2009 14:59) Added new observation of TDBOOSTDUE: 01/27/2011 (02/13/2009 14:59) Added new observation of HDLNXTDUE: 06/25/2013 (02/13/2009 14:59) Added new observation of LDLNXTDUE: 01/19/2013 (02/13/2009 14:59) Added new observation of PAP DUE: 01/19/2009 (02/13/2009 14:59) Added new observation of CREATNXTDUE: 06/25/2009 (02/13/2009 14:59) Added new observation of POTASSIUMDUE: 06/25/2009 (02/13/2009 14:59) Added new observation of LST COLON DT: 02/06/2009 (02/06/2009 15:00) Added new observation of COLONOSCOPY: normal (02/06/2009 15:00)      Colonoscopy Result Date:  02/06/2009 Colonoscopy Result:  normal Colonoscopy Next Due:  10 yr

## 2010-02-12 NOTE — Progress Notes (Signed)
Summary: refill   Phone Note Refill Request Call back at Home Phone [phone] Message from:  Patient on May 15, 2009 2:31 PM  Refills Requested: Medication #1:  ADDERALL 10 MG TABS 1 by mouth two times a day call when ready --- she would like today   Method Requested: Pick up at Office Initial call taken by: Okey Regal [place],  May 15, 2009 2:32 PM  Follow-up for Phone Call        eft pt detail message rx ready for pick-up tomorrow after 10 am...Marland KitchenMarland KitchenFelecia Deloach CMA  May 15, 2009 4:48 PM     Prescriptions: ADDERALL 10 MG TABS (AMPHETAMINE-DEXTROAMPHETAMINE) 1 by mouth two times a day  #60 x 0   Entered by:   Jeremy Johann CMA   Authorized by:   Loreen Freud DO   Signed by:   Jeremy Johann CMA on 05/15/2009   Method used:   Print then Give to Patient   RxID:   [id number]

## 2010-02-12 NOTE — Progress Notes (Signed)
Summary: Concerns  Phone Note Call from Patient   Caller: Patient Summary of Call: Pt is concerned because she has a patch, she had a "wide mouth hernia"  before and this is the same symptoms. Is there anything she needs to do? [name] CMA  April 22, 2009 4:19 PM   Follow-up for Phone Call        Radiologist said it was small---- can refer to GI if she wants. Follow-up by: Latoya Freud DO,  April 22, 2009 5:10 PM  Additional Follow-up for Phone Call Additional follow up Details #1::        Pt is aware, she saw Dr.Mann in the last 6 months she does not want to see GI at this point. [name] CMA  April 23, 2009 8:34 AM     Additional Follow-up for Phone Call Additional follow up Details #2::    fax ct to Dr [name] for her records Follow-up by: Latoya Freud DO,  April 23, 2009 11:00 AM

## 2010-02-12 NOTE — Consult Note (Signed)
Summary: [facility]  [facility]   Imported By: Lanelle Bal 02/11/2009 11:01:48  _____________________________________________________________________  External Attachment:    Type:   Image     Comment:   External Document

## 2010-02-12 NOTE — Progress Notes (Signed)
Summary: Results 10/19  Phone Note Outgoing Call   Call placed by: Almeta Monas CMA Duncan Dull),  October 30, 2009 3:49 PM Call placed to: Patient Details for Reason: Results Summary of Call: Cholesterol is even higher than previously---- ratio is high which puts pt higher risk---- I would like her to try Crestor 5 mg ---she can start with every other day and we can give her samples to try first------- recheck 2 months-----272.4  [facility]---- ov 2-3 weeks after labs done  Left message to call back      Almeta Monas CMA Duncan Dull)  October 30, 2009 3:49 PM   Follow-up for Phone Call        pt aware and will  pick up samples.  Says she had problems before and does not want to habve it called in yet. I left the 5mg  Crestor upfront for the pt to pick up. Follow-up by: Almeta Monas CMA Duncan Dull),  October 31, 2009 3:50 PM

## 2010-02-12 NOTE — Medication Information (Signed)
Summary: Prior Authorization & Approval for Adderall/[facility]  Prior Authorization & Approval for Adderall/[facility]   Imported By: Lanelle Bal 08/14/2009 12:30:47  _____________________________________________________________________  External Attachment:    Type:   Image     Comment:   External Document

## 2010-02-12 NOTE — Letter (Signed)
Summary: [facility]  [facility]   Imported By: Lanelle Bal 01/29/2009 12:44:24  _____________________________________________________________________  External Attachment:    Type:   Image     Comment:   External Document

## 2010-02-13 NOTE — Progress Notes (Signed)
Summary: Change and refill ritalin  Phone Note Call from Patient   Caller: Patient Call For: Loreen Freud DO Reason for Call: Refill Medication Details for Reason: Refill Ritalin/Change dose Summary of Call: spoke with patient and she stated the 20 mg two times a day Ritalin did not work for her, she stated it was too much. wanted to know if we could switch her to 10 mg three times a day instead. Also advised that her husbands Rx was incorrect, I advised her to have him give [place] a calll....please advise Initial call taken by: Almeta Monas CMA Duncan Dull),  January 10, 2010 11:15 AM  Follow-up for Phone Call        ritalin 10 mg 1 by mouth three times a day #90  Follow-up by: Loreen Freud DO,  January 10, 2010 12:32 PM  Additional Follow-up for Phone Call Additional follow up Details #1::        pt aware Rx ready for pick up Additional Follow-up by: Almeta Monas CMA Duncan Dull),  January 10, 2010 1:13 PM    New/Updated Medications: RITALIN 10 MG TABS (METHYLPHENIDATE HCL) 1 by mouth three times a day Prescriptions: RITALIN 10 MG TABS (METHYLPHENIDATE HCL) 1 by mouth three times a day  #90 x 0   Entered by:   Almeta Monas CMA (AAMA)   Authorized by:   Loreen Freud DO   Signed by:   Almeta Monas CMA (AAMA) on 01/10/2010   Method used:   Print then Give to Patient   RxID:   [id number]

## 2010-02-19 ENCOUNTER — Telehealth: Payer: Self-pay | Admitting: Family Medicine

## 2010-02-27 NOTE — Progress Notes (Signed)
Summary: ritalin refill  Phone Note Refill Request Message from:  Patient's spouse = Remi Deter on February 19, 2010 1:41 PM  Refills Requested: Medication #1:  RITALIN 10 MG TABS 1 by mouth three times a day. Patient needs Ritalin prescription refilled.  Advised patient that it will be ready in 24 hours for pickup unless someone calls them  Initial call taken by: Jerolyn Shin,  February 19, 2010 1:42 PM  Follow-up for Phone Call        last filled 01/10/10 and seen 11/05/09 please advise Follow-up by: Almeta Monas CMA Duncan Dull),  February 20, 2010 9:17 AM    Prescriptions: RITALIN 10 MG TABS (METHYLPHENIDATE HCL) 1 by mouth three times a day  #90 x 0   Entered by:   Almeta Monas CMA (AAMA)   Authorized by:   Loreen Freud DO   Signed by:   Almeta Monas CMA (AAMA) on 02/20/2010   Method used:   Print then Give to Patient   RxID:   [id number]

## 2010-04-15 ENCOUNTER — Telehealth: Payer: Self-pay | Admitting: Family Medicine

## 2010-04-15 MED ORDER — METHYLPHENIDATE HCL 10 MG PO TABS: 10.0000 mg | ORAL_TABLET | Freq: Three times a day (TID) | ORAL | Status: DC

## 2010-04-15 NOTE — Telephone Encounter (Signed)
Refill ritalin - patient will pick up thurs - [id number] °

## 2010-04-15 NOTE — Telephone Encounter (Signed)
Rx left at check In for 04/17/10 pick up---  KP

## 2010-05-27 NOTE — Assessment & Plan Note (Signed)
[name] returns today.  She is a 60 year old female with bilateral  sacroiliac disorder.  She had a fall about 3 weeks ago onto her  buttocks.  She had seen her chiropractor who did some x-rays and did  some adjustments.  X-rays showed no evidence of fracture.  She called  her psychiatrist, who called in some Flexeril for her.  She just started  on that yesterday.  She was seen by orthopedics with regard to knee  pain.  She has had some Synvisc injections recently.  She has had no  knee injury with the last fall.   Her average pain is a 5/10 to 6/10, mainly in the back, somewhat down  the right hip, as well as right greater than left knee.  Her sleep has  been fair.  Her pain is worse with activity and improves with rest.  She  is on disability.   REVIEW OF SYSTEMS:  Positive for constipation.  She has just finished  physical therapy for her sacroiliac disorder, and was doing quite well  prior.   She has been taking a few more Ultram than usual because of her pain  because of her more acute pain.   EXAMINATION:  Blood pressure 111/69, pulse 63, respirations 18, O2  saturation 96% on room air.  GENERAL:  In no acute distress.  Mood and affect appropriate.  Affect is  bright.  Gait is normal.  She has mild tenderness to PSIS as well as right gluteus medius area.  She has a mild effusion in right knee, but not of left knee.  Good range  of motion in the hips, knees, and ankles.  She has normal deep tendon  reflexes and strength in the lower extremities.   IMPRESSION:  Low back pain, nonspecific secondary to fall.  No radicular  component.  She may have some exacerbation of her sacroiliac pain.   PLAN:  1. We will give her Toradol injection 30 mg IM today.  2. Celebrex 200 p.o. b.i.d. for 7 days.  3. Expect to run a bit early on her tramadol, but we will refill a bit      early if needed.  4. I will see her back next month for followup and possible repeat SI       injection.      Erick Colace, M.D.  Electronically Signed     AEK/MedQ  D:  11/23/2006 17:19:40  T:  11/24/2006 13:14:31  Job #:  [id number]

## 2010-05-27 NOTE — Procedures (Signed)
[name], [name]             ACCOUNT NO.:  [id number]   MEDICAL RECORD NO.:  [id number]          PATIENT TYPE:  REC   LOCATION:  TPC                          FACILITY:  [facility]   PHYSICIAN:  Erick Colace, M.D.DATE OF BIRTH:  [birth year]   DATE OF PROCEDURE:  09/30/2006  DATE OF DISCHARGE:                               OPERATIVE REPORT   PROCEDURE:  Bilateral sacroiliac injection under fluoroscopic guidance.   INDICATIONS:  Sacroiliac pain previously demonstrated by positive  response to sacroiliac injection.  Her pain at rest it is 4/10, and this  is even with narcotic analgesic medications.  She has also had physical  therapy.   Her last injection was performed April 16, 2006. and she has had good  relief with this until recently when I reevaluated her on August 23, 2006.   Informed consent was obtained after describing the risks and benefits of  the procedure to the patient.  These include bleeding, bruising,  infection, loss of bowel and bladder function.  She elects proceed and  has given written consent.   The patient placed prone on fluoroscopy table, Betadine prep, sterile  drape.  A 25-gauge inch and half needle was used to anesthetize the skin  and subcu tissue with 1% lidocaine x2 mL, then a 3-inch, 25-gauge spinal  needle was inserted first into the right SI joint under AP, lateral and  oblique imaging.  Omnipaque 180 x 0.5 mL demonstrated no intravascular  uptake and good joint outline, followed by injection of a solution of  0.5 mL of 40 mg/mL Depo-Medrol and 1 mL of 2% MPF lidocaine.  The same  procedure was repeated on the left side the using same injectate, same  technique, same equipment.  The patient tolerated the procedure well.  Post injection instructions given.  Pre injection pain 4, post injection  2.  Follow-up in 3 months.  This has been the duration of effect based  on previous injections.      Erick Colace, M.D.  Electronically  Signed     AEK/MEDQ  D:  09/30/2006 13:41:51  T:  09/30/2006 15:01:34  Job:  [id number]

## 2010-05-27 NOTE — Procedures (Signed)
NAMEMADELON, WELSCH             ACCOUNT NO.:  [id number]   MEDICAL RECORD NO.:  [id number]          PATIENT TYPE:  REC   LOCATION:  TPC                          FACILITY:  [facility]   PHYSICIAN:  Erick Colace, M.D.DATE OF BIRTH:  [birth year]   DATE OF PROCEDURE:  04/11/2007  DATE OF DISCHARGE:                               OPERATIVE REPORT   PROCEDURE:  Bilateral sacroiliac injection under fluoroscopic guidance.   INDICATIONS:  Sacroiliac pain only partially responsive to medication  management and other conservative care.  Last injection September 20, 2006.   She has been off ibuprofen for 1 week.   Informed consent was obtained after describing the risks and benefits of  the procedure to the patient.  These include bleeding, bruising,  infection, temporary or permanent lower extremity weakness.  She elects  to proceed and has given written consent.  The patient placed prone on  fluoroscopy table.  Betadine prep and sterile drape.  A 25-gauge inch  and half needle was used to anesthetize skin and subcu tissue, 1%  lidocaine x3 mL on each side.  Then a 25-gauge 3-inch needle was  inserted first into the right SI joint, AP, lateral and oblique imaging  utilized.  Omnipaque 180 x0.5 mL demonstrated good joint outline,  followed by injection of 0.5 mL of 40 mg/mL of Depo-Medrol and 1 mL of  2% MPF lidocaine.  The same procedure was repeated on the left side with  the same needle, injectate and technique.  The patient tolerated the  procedure well.  Pre injection pain level 05/10, post injection pain  level 2/10.  I will see her back in 3-4 months repeat injection.      Erick Colace, M.D.  Electronically Signed     AEK/MEDQ  D:  04/11/2007 16:25:28  T:  04/12/2007 06:21:03  Job:  [id number]

## 2010-05-27 NOTE — Assessment & Plan Note (Signed)
The patient returns today. She was last seen by me on April 16, 2006, at  which time performed bilateral sacroiliac injection under fluoroscopic  guidance. She had a pre-injection pain level at 7/10 and post-injection  of 0/10. She has had several sacroiliac injections and each of which had  been successful in reducing her pain level. She has had injections on  December 22, 2004, January 19, 2005 and February 19, 2005, March 19, 2005,  June 25, 2005, October 15, 2005, December 31, 2005 and last on April 16, 2006. At each time she had a total of 40 mg of Depo-Medrol injected so  that her dose in the last year total 200 mg. This is below the 360 mg  per year guideline.   She has had no changes to her symptomatology. She states that she has  had a repeat MRI of her lumbar spine and her spine surgeon stated that  she has had no operative lesions per her report. She also gives some  complaints of knee pain and wonders if she can get a knee brace. She has  seen her orthopedic surgeon for this and has had some x-rays, but does  not have any reports or films for me to review.   Her average pain in her back is around 4-5/10. Her current pain is a 3-  4/10. Her pain interferes with general activity at a moderate level. Her  sleep is fair. She has been getting good relief with massage therapy as  well as what sounds like craniosacral therapy.   REVIEW OF SYSTEMS:  Is positive for anxiety and depression, trouble  walking, spasms. She has had some weight gain, urinary problems.   She sees Dr.  Gavin Potters at the [facility] for her knees and Dr.  Emelia Loron at [facility] Psychology Department.   SOCIAL HISTORY:  Single, lives alone.   PHYSICAL EXAMINATION:  Blood pressure 123/78, pulse 78, respiratory rate  is 18, O2 sat is 98% on room air.   Her knee shows no evidence of swelling. No redness. She has no lower  extremity edema, although she feels like she is swollen. She has  tenderness over the  PSIS area bilaterally with positive Faber's maneuver  referring pain to the PSIS area bilaterally. She has good spine range of  motion with forward flexion and extension. She has normal deep tendon  reflexes and normal lower extremity sensation and strength.   IMPRESSION:  Sacroiliac disorder. In general, she has been getting  injections every 2-3 months and it has been over four months since the  last one. I think she can benefit from scheduling another injection.  Sacroiliac RF might be helpful.   In terms of further workup, would like to see a copy of her latest MRI,  which she states was rather recent. She has had good relief with one  facet joint injection that we performed in 2007 and this may be  contributing to her overall pain pattern.      Erick Colace, M.D.  Electronically Signed     AEK/MedQ  D:  08/23/2006 17:45:52  T:  08/24/2006 20:09:33  Job #:  [id number]   cc:   Ruthann Cancer, MD  [address]  [place], [place] [address]   Cecille Amsterdam  Fax: [phone]   Emelia Loron, MD  Psychology Department  [facility] [place], [place] [address]

## 2010-05-27 NOTE — Assessment & Plan Note (Signed)
The patient returns today after she last saw me February 13, 2006.  She  is a 60 year old female with sacroiliac arthropathy and a history of  bipolar disorder as well as lower extremity numbness and tingling.   Her EMG which was performed December 14, 2006 was normal.  We tested the  peroneal nerve, tibial nerve, sural nerve, as well as each reflex study  and lower extremity needle exam.   Overall she is doing better.  She has had pain in elbows reduced to 2 to  3 out of 10.  On average, her relief from meds is good.   She has a review of systems positive for weakness, tingling, trouble  walking, spasms, depression, suicidal thoughts, although she states that  this was really much better.  She does see psychology.  She does have  mild limb swelling.   She has had recent psychiatric testing, February 23, 2007.   SOCIAL HISTORY:  Lives alone.  Rare alcohol use.   PHYSICAL EXAMINATION:  Blood pressure 110/69, pulse 100, respirations  20, O2 sat 97% on room air.  GENERAL:  No acute distress, mood and affect appropriate.  Her gait is normal.  She has normal lumbar range of motion, and lower  extremity strength is normal, deep tendon reflexes are normal.  Sensation is intact.   IMPRESSION:  1. Sacroiliac arthropathy improved.  She is doing more exercises.  Her      last sacroiliac injection was September 30, 2006.  I do not think      she needs another one at this time.  Will continue Ultram 50 mg      q.i.d.  Continue Celebrex 200 mg b.i.d.  2. I will see her back in 4 months.  Will try to reduce her Celebrex      at that time.      Erick Colace, M.D.  Electronically Signed     AEK/MedQ  D:  02/24/2007 17:31:38  T:  02/25/2007 17:36:33  Job #:  [id number]   cc:   Cecille Amsterdam  Fax: [phone]   Glendon Axe  [facility] Psychology Department

## 2010-05-27 NOTE — Op Note (Signed)
NAMEMARVELYN, BOUCHILLON             ACCOUNT NO.:  [id number]   MEDICAL RECORD NO.:  [id number]          PATIENT TYPE:  INP   LOCATION:  5025                         FACILITY:  [facility]   PHYSICIAN:  Madlyn Frankel. Charlann Boxer, M.D.  DATE OF BIRTH:  1950/07/18   DATE OF PROCEDURE:  10/28/2007  DATE OF DISCHARGE:                               OPERATIVE REPORT   PREOPERATIVE DIAGNOSIS:  Right knee osteoarthritis.   POSTOPERATIVE DIAGNOSIS:  Right knee osteoarthritis.   PROCEDURE:  Right total knee replacement.   COMPONENTS USED:  DePuy rotating platform posterior stabilized knee  system size 2 femur, 1.5 tibia, 12.5 insert to match the 2 femur, 32  patellar button.   SURGEON:  Madlyn Frankel. Charlann Boxer, M.D.   ASSISTANT:  Surgical tech.   ANESTHESIA:  Regional femoral block plus a Duramorph spinal block.   COMPLICATIONS:  None.   DRAINS:  Times one medium Hemovac.   TOURNIQUET TIME:  60 minutes at 250 mmHg.   SPECIMENS:  None.   INDICATION FOR PROCEDURE:  Ms. Steven is a 60 year old female who  presented to the office for bilateral knee osteoarthritis and pain  refractory to conservative measures.  We discussed the risks and  benefits of partial versus total knee replacement in the setting that  she was in.  Pros and cons reviewed.  Consent was obtained for a right  total knee replacement.  Consent obtained knowing the risks of  infection, DVT, stiffness, need for revision for any reason, consent  obtained.   PROCEDURE IN DETAIL:  The patient was brought to the operative theater.  Once adequate anesthesia, preoperative antibiotics, Ancef administered,  the patient was positioned supine with a proximal thigh tourniquet  placed.  The right lower extremity was pre scrubbed and prepped and  draped in a sterile fashion.  The leg was exsanguinated, tourniquet  elevated.  A midline incision was made following median arthrotomy and  initial debridement attention was first directed to patella.  Precut  measure was 20-21 mm.  I resected down to 13 mm using a 32 patellar  button.  Patellar height was restored.   Attention was now directed to femur where the femoral canal was opened  and irrigated to prevent fat emboli.  I then placed intramedullary rod  and did restore stature and cut 10 mm of bone off the distal femur with  a size 3 degrees of valgus.   Following the distal femoral cut, attention was directed to tibia.  Tibia was subluxated anteriorly.  Meniscus were removed as well as  removal of the cruciate stumps.  Extramedullary guide was placed and I  resected 10 mm of bone off the lateral proximal side.   I checked with extension block and felt that the 12.5 felt best and the  knee came out to full extension.  Pins were removed from the  extramedullary guide.   Attention was now redirected back to the femur.  Femoral rotation was  based off the proximal tibia cut which was checked and found to be  perpendicular in both planes.  Rotation block was placed with the  anterior stylus  on the anterior cortex.  Pins were placed.  The femur  had been sized to be a size, between a size 1-1/2 and 2 and I chose a  size 2.  A size 2 cutting block was placed.  The anterior and posterior  and chamfer cuts were all made.  Final box cut was made off the lateral  aspect of the distal femur.  The tibia was now subluxated anterior and  final preparation of tibia was carried out and size 1-1/2 tibial tray  fit best on the cut surface.  It was pinned into position, drilled and  keel punched into the central portion of the medial bone of the tibial  tubercle.  A trial reduction was now carried out with a 2 femur, 1-1/2  tibia and the 12.5 insert.  With this the knee was found to come to full  extension and the ligaments appeared to be nice and stable from  extension to flexion.  The patella was noted to track through the  trochlea without any application of pressure.   Following this trial  components were removed.  The synovial capsule  junction was injected with 30 mL of 0.25% Marcaine with epinephrine and  1 mL of Toradol.  The final components were opened and cement mixed.  Final components were then cemented into position.  The knee was brought  to extension with a 12.5 insert.  Extruded cement was removed.  Once  cement cured, excessive cement was removed throughout the knee  particularly posteriorly.  The knee was reirrigated and the final 12.5  insert to match the 2 femur was then placed into the knee.  The knee  reduced.  At this point I reirrigated the knee, placed a medium Hemovac  drain deep, tourniquet was let down at 60 minutes.  Extensor mechanism  was then reapproximated using #1 Vicryl.  The remaining wound was closed  with 2-0 Vicryl and running 4-0 Monocryl.  The knee was cleaned, dried  and dressed sterilely with Steri-Strips and a bulky sterile wrap.  She  was brought to the recovery room in stable condition tolerating the  procedure well.      Madlyn Frankel Charlann Boxer, M.D.  Electronically Signed     MDO/MEDQ  D:  10/28/2007  T:  10/29/2007  Job:  [id number]

## 2010-05-28 ENCOUNTER — Telehealth: Payer: Self-pay | Admitting: Family Medicine

## 2010-05-28 NOTE — Telephone Encounter (Signed)
Needs prescription for Ritalin---will pick up on Friday °

## 2010-05-29 MED ORDER — METHYLPHENIDATE HCL 10 MG PO TABS: 10.0000 mg | ORAL_TABLET | Freq: Three times a day (TID) | ORAL | Status: DC

## 2010-05-29 NOTE — Telephone Encounter (Signed)
Printed and left at check in    KP 

## 2010-05-30 NOTE — Discharge Summary (Signed)
NAMESHAELYNN, DRAGOS             ACCOUNT NO.:  [id number]   MEDICAL RECORD NO.:  [id number]          PATIENT TYPE:  INP   LOCATION:  5025                         FACILITY:  [facility]   PHYSICIAN:  Madlyn Frankel. Charlann Boxer, M.D.  DATE OF BIRTH:  1950/03/07   DATE OF ADMISSION:  10/28/2007  DATE OF DISCHARGE:  10/31/2007                               DISCHARGE SUMMARY   ADMITTING DIAGNOSES:  1. Osteoarthritis.  2. Depression.  3. Reflux.  4. Irritable bowel syndrome.  5. Hepatitis type B.   DISCHARGE DIAGNOSES:  1. Osteoarthritis.  2. Depression.  3. Reflux disease.  4. Irritable bowel syndrome.  5. Hepatitis type B.  6. Acute loss anemia.   HISTORY OF PRESENT ILLNESS:  A 60 year old female with a history of  right knee pain secondary to osteoarthritis refractory to all  conservative treatment, admitted to the hospital for right total knee  replacement.  She was presurgically assessed prior to surgery.   CONSULTS:  None.   PROCEDURE:  Right total knee replacement.   SURGEON:  Madlyn Frankel. Charlann Boxer, MD   ASSISTANT:  Yetta Glassman. Mann, PA-C   LABORATORY DATA:  Final CBC after transfusion with 2 units of blood.  Hemoglobin 11.6, hematocrit 34.8, and platelets 178.  Coags all within  normal limits.  Routine chemistry final reading; sodium 136, potassium  3.8, glucose 104, and creatinine 0.76.  Her calcium was 8.2.  Her UA was  negative.   Cardiology; EKG prior to admission showed normal sinus rhythm.   Chest x-ray; no chest x-ray found on the chart.   HOSPITAL COURSE:  The patient was admitted to the hospital, underwent a  right total knee replacement, and tolerated the procedure well.  She had  acute blood loss anemia on the first day, was transfused 2 units and  resolved.  Her most significant issues on day 2, dressing was changed.  No significant drainage from the wound.  She made good progress with  physical therapy and by day 3, when we saw her, she was doing well,  afebrile,  hemodynamically and orthopedically stable.  Meeting all  criteria for discharge home with Home Health Care PT per Physical  Therapy.   DISCHARGE DISPOSITION:  Discharged home with Home Health Care PT, stable  improved condition.   DISCHARGE DIET:  Regular.   DISCHARGE WOUND CARE:  Keep wound dry.   DISCHARGE MEDICATIONS:  1. Lovenox 40 mg subcu q.24 h. x11 days.  2. Robaxin 500 mg p.o. q.6 h.  3. Iron 325 mg p.o. t.i.d.  4. Colace 100 mg p.o. b.i.d.  5. MiraLax 17 g p.o. daily.  6. Tramadol hold.  7. Zolpidem 10 mg p.o. nightly p.r.n.  8. Clonazepam 1 mg p.o. nightly.  9. Cymbalta 30 mg 2 p.o. daily.  10.Ativan 1 mg p.o. b.i.d. p.r.n.  11.Lyrica 150 mg p.o. b.i.d.  12.Premarin 0.625 daily.  13.Estrace cream p.r.n.  14.Prilosec 20 mg p.o. daily.   DISCHARGE FOLLOWUP:  Follow with Dr. Charlann Boxer, phone number [phone] in 2  weeks for wound check.     ______________________________  Yetta Glassman. [address], [place]  Madlyn Frankel Charlann Boxer, M.D.  Electronically Signed    BLM/MEDQ  D:  12/22/2007  T:  12/22/2007  Job:  [id number]   cc:   Gardiner Rhyme, MD  Dr. Achilles Dunk

## 2010-05-30 NOTE — Procedures (Signed)
NAMEANASTON, KOEHN             ACCOUNT NO.:  [id number]   MEDICAL RECORD NO.:  [id number]          PATIENT TYPE:  REC   LOCATION:  TPC                          FACILITY:  [facility]   PHYSICIAN:  Erick Colace, M.D.DATE OF BIRTH:  09-27-1950   DATE OF PROCEDURE:  DATE OF DISCHARGE:                                 OPERATIVE REPORT   PROCEDURE:  Right sacroiliac joint injection under fluoroscopic guidance.   Informed consent was obtained after describing the risks and benefits of the  procedure to the patient.  These included bleeding, bruising, infection,  loss of bowel or bladder function, temporary or permanent paralysis.  She  elects to proceed and has given written consent.   INDICATIONS:  She has had previous good relief with sacroiliac joint  injection.  We have been able to get her off of methadone and only on  Ultram.   Patient placed prone on the fluoroscopy table.  Betadine prep and sterile  drape.  A 25 gauge 1-1/2 inch needle was used to inject the skin and  subcutaneous tissues.  Lidocaine 1% x2 cc and a 22 gauge 3 inch spinal  needle was inserted into the right SI under fluoroscopic guidance.  AP and  lateral imaging, good joint.  Arthrogram visualized with live fluoro  injection, Omnipaque 180 x0.5 cc.  This is followed by injection of 0.5 cc  of 40 mg/cc Depo-Medrol plus 1.5 cc of 2% lidocaine.  Patient tolerated the  procedure well.  Pre and post injection vitals stable.  Her post injection  pain score is 1/10.  She will return in three months.  Continue Ultram but  increase to 1 p.o. q.i.d.      Erick Colace, M.D.  Electronically Signed     AEK/MEDQ  D:  06/25/2005 16:42:50  T:  06/25/2005 20:25:44  Job:  [id number]   cc:   Donnetta Hutching, MD  [address] [place], [place] [address]

## 2010-05-30 NOTE — H&P (Signed)
[name], [name]             ACCOUNT NO.:  [id number]   MEDICAL RECORD NO.:  [id number]          PATIENT TYPE:  INP   LOCATION:  NA                           FACILITY:  [facility]   PHYSICIAN:  Madlyn Frankel. Charlann Boxer, M.D.  DATE OF BIRTH:  1950-09-13   DATE OF ADMISSION:  09/28/2007  DATE OF DISCHARGE:                              HISTORY & PHYSICAL   ATTENDING PHYSICIAN:  Madlyn Frankel. Charlann Boxer, MD.   PROCEDURE:  Right total knee replacement.   CHIEF COMPLAINTS:  Right knee pain.   HISTORY OF PRESENT ILLNESS:  The patient is a 60 year old female with a  history of right knee pain secondary to osteoarthritis.  It has been  refractory to all conservative treatments including oral anti-  inflammatories and cortisone injections.  She used to be previously  assessed by her primary care physician, Dr. Nehemiah Settle.   UROLOGIST:  Debroah Baller, MD.   PAST MEDICAL HISTORY:  Significant for:  1. Osteoarthritis.  2. Depression.  3. History of phlebitis.  4. Reflux disease.  5. Hepatitis B.  6. Irritable bowel syndrome.  7. Bilateral hydronephrosis kidney disease.  8. History of MRSA in her left foot with toe amputation.  9. Herpes.   PAST SURGICAL HISTORY:  1. Tonsillectomy.  2. Esophagus stretch.  3. Total hysterectomy.  4. Bilateral knee scopes.  5. MRSA, left toe with amputation.  6. Bilateral carpal tunnel.  7. Nasal tumor removed.  8. Fibroid cyst removed.   FAMILY HISTORY:  Cancer.   SOCIAL HISTORY:  Married, disabled [employer] fund[employer], primary caregiver will be husband  in the home after surgery.   DRUG ALLERGIES:  SELF, ERYTHROMYCIN, ALL MYCINS, TETRACYCLINE, SKELAXIN,  and CEFTIN, which causes shock.   MEDICATIONS:  1. Clonazepam 1 mg p.o. nightly.  2. Ativan 1 mg p.o. b.i.d. p.r.n.  3. Premarin 0.625 daily.  4. Ultram two p.o. q.4-6 h. p.r.n. pain.  5. Lyrica 150 mg p.o. b.i.d.  6. Nexium.  7. Ambien 10 mg p.o. nightly.  8. Cymbalta 60 mg p.o. daily.  9. Prilosec 40 mg p.o. b.i.d.  10.Estrace p.r.n.  11.Lidoderm patch q.12 h. P.r.n.  12.Flexeril 10 mg p.o. b.i.d. p.r.n.  13.Multivitamin daily.  14.Fish oil daily.  15.Calcium, magnesium, and zinc daily.  16.Quinine for leg cramps.  17.MiraLax 70 g p.o. daily.   REVIEW OF SYSTEMS:  RESPIRATORY:  She has shortness of breath on  exertion.  CARDIOVASCULAR:  She has intermittent palpitations.  GASTROINTESTINAL:  She has difficulty swallowing and constipation.  GENITOURINARY:  She has flank pain and urinary retention at times.  MUSCULOSKELETAL:  Multiple joint pain and chronic back pain.  Otherwise  see HPI.   PHYSICAL EXAMINATION:  VITAL SIGNS:  Pulse 72, respirations 16, blood  pressure 124/76.  GENERAL:  Awake, alert, and oriented.  Well-developed and well-  nourished.  NECK:  Supple.  No carotid bruits.  CHEST:  Lungs clear to auscultation bilaterally.  BREASTS:  Deferred.  HEART:  Regular rate and rhythm.  S1 and S2 distinct.  ABDOMEN:  Soft, nontender, and nondistended.  Bowel sounds present.  GENITOURINARY:  Deferred.  EXTREMITIES:  Right  knee has neutral alignment with medial sided  tenderness.  SKIN:  No cellulitis.  NEUROLOGIC:  Intact distal sensibilities.   LABORATORY DATA:  Labs, EKG, chest x-ray all pending, proceed for the  testing.   IMPRESSION:  Right knee osteoarthritis.   Plan of action is right total knee replacement at [facility] on  October 28, 2007, by surgeon Dr. Durene Romans.  Risks and complications  were discussed.   Postoperative medications including Lovenox, Robaxin, iron, aspirin,  Colace, and MiraLax were provided at the time of history and physical.  In addition, Celebrex will be utilized perioperatively on b.i.d. basis  for 2 weeks.     ______________________________  Yetta Glassman [address], [place]      Madlyn Frankel. Charlann Boxer, M.D.  Electronically Signed    BLM/MEDQ  D:  10/10/2007  T:  10/11/2007  Job:  [id number]   cc:   Deirdre Peer. Polite, M.D.  Debroah Baller, M.D.

## 2010-05-30 NOTE — Procedures (Signed)
[name], [name]             ACCOUNT NO.:  [id number]   MEDICAL RECORD NO.:  [id number]          PATIENT TYPE:  REC   LOCATION:  TPC                          FACILITY:  [facility]   PHYSICIAN:  Erick Colace, M.D.DATE OF BIRTH:  [birth year]   DATE OF PROCEDURE:  12/22/2004  DATE OF DISCHARGE:                                 OPERATIVE REPORT   This is a left sacroiliac joint injection under fluoroscopic guidance.   REASON FOR INJECTION:  Left buttock pain, chronic despite medication  management.  The patient is a poor candidate for narcotic analgesic  management given psychiatric history.  Informed consent was obtained  describing the risks and benefits of the procedure to the patient.  These  include bleeding, bruising, infection, loss of bowel and bladder function,  temperature, permanent paralysis.  She elects to proceed and has given  written consent.   DESCRIPTION OF PROCEDURE:  The patient was placed prone on the fluoroscopy  table, Betadine prepped, and sterilely draped.  A 25 gauge 1-1/2 inch needle  was used to anesthetize the skin and subcutaneous tissue with 1% lidocaine  x2 mL and then a 25 gauge 3 inch spinal needle was inserted under  fluoroscopic guidance into the left SI joint.  AP and lateral images  confirmed intra articular position Omnipaque 180 under live fluoro  demonstrated no intravascular uptake with a good joint arthrogram.   Then a solution containing 0.5 mL of 40 mg/mL Depo-Medrol and 1 mL of 2%  methyl paraben free lidocaine were injected.  The patient tolerated the  procedure well.  Pre and post injection vitals were stable.  I will see her  back in 1 month for possible reinjection.      Erick Colace, M.D.  Electronically Signed     AEK/MEDQ  D:  12/22/2004 10:08:42  T:  12/22/2004 11:58:50  Job:  [id number]   cc:   Shelia Media  Fax: [phone]   Donnetta Hutching, MD  [address] [place], [place] [address]   Elliot Cousin, M.D.   Geoffery Lyons, M.D.

## 2010-05-30 NOTE — Procedures (Signed)
NAMETHETIS, SCHWIMMER             ACCOUNT NO.:  [id number]   MEDICAL RECORD NO.:  [id number]          PATIENT TYPE:  REC   LOCATION:  TPC                          FACILITY:  [facility]   PHYSICIAN:  Erick Colace, M.D.DATE OF BIRTH:  02-27-1950   DATE OF PROCEDURE:  10/15/2005  DATE OF DISCHARGE:                                 OPERATIVE REPORT   PROCEDURE PERFORMED:  Left sacroiliac joint injection under fluoroscopic  guidance.   INDICATIONS FOR PROCEDURE:  Left buttock pain previously released with  sacroiliac joint injection.   DESCRIPTION OF PROCEDURE:  Patient placed prone on fluoroscopy table.  Betadine prep, sterile drape, 25 gauge 1-1/2-inch needle was used to  anesthetize the skin and subcutaneous tissue with 1% lidocaine x2 mL.  Then  a 25 gauge 3-inch spinal needle was inserted in the left SI joint under  fluoroscopic guidance.  AP and lateral imaging utilized.  Omnipaque 180 x  0.5 mL demonstrated good joint arthrogram, no capsular leaks, no evidence of  intravascular uptake.  Then a solution containing 1 mL of 2% MPF lidocaine  plus 0.5 mL of 40 mg per mL Depo-Medrol was injected.  The patient tolerated  the procedure well.  Preinjection pain level 3/10, postinjection 0/10.      Erick Colace, M.D.  Electronically Signed     AEK/MEDQ  D:  10/15/2005 17:58:13  T:  10/17/2005 12:35:37  Job:  [id number]   cc:   Dr. _________  [facility]

## 2010-05-30 NOTE — Assessment & Plan Note (Signed)
Patient originally scheduled for bilateral sacroiliac joint  injection  under fluoroscopic guidance.  Her last one was done December 31, 2005.  She went from a 10/10 pain to a 0/10 pain and currently she is still  only a 2/10 pain.  This is despite starting taking some Yoga classes.  Overall, she feels like she is doing well.  She has had some problems  with her stomach and has undergone EGD yesterday and she also has some  type of precancerous lesion on her nose, but otherwise has had no new  medical complication.   She can walk 20 minutes at a time.  She drives.   Her gait is normal.  Her transfers are independent.   Her affect is anxious but no lability or agitation.   IMPRESSION:  Sacroiliac disorder, currently with minimal symptomatology.  Will hold off on repeat sacroiliac injection, encourage her to keep up  with the Yoga and I will see her on a p.r.n. basis should she need the  injection.      Erick Colace, M.D.  Electronically Signed     AEK/MedQ  D:  03/11/2006 14:04:31  T:  03/11/2006 14:33:16  Job #:  [id number]
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
## 2010-05-30 NOTE — Group Therapy Note (Signed)
REFERRING PHYSICIAN:  Dr. Regino Schultze.   REASON FOR EVALUATION:  Left sided back and buttock pain, right.   HISTORY OF PRESENT ILLNESS:  The patient is a 60 year old female who has a  long history of back pain.  She had been treated for medications even prior  to a fall in 1999 off her porch where she sustained a foot fracture but also  states that she hurt both her wrist and back.  She has issues regarding this  fall.  She did not require any hospitalization following that fall.  She  states there is on and off pain in her low back.  She has been to two  different pain clinics, Dr. Donne Anon at [facility].  She has been  previously Orth[facility] Surgery here in [place].  At that time, she  stated that she was in bed 16 hours per day.  She talking to me today.  She  states that her average pain is 6-7/10.  Her pain right now is 3/10.  Her  pain with general activity was 4, relationship with other people is 4 and  enjoyment of life is 4.  She describes her pain as being burning, stabbing  and tingling, and her pain diagram shows pain in the center of her lower  back, both the buttock going into the left buttock and hip area.  She also  marks her right knee.  Her pain is worse in the evening.  Her sleep is fair.  She uses TENS intermittently which helps with her pain.  Rest helps with her  pain.  She states her medications help about 50%.  Her pain is worse with  __________.  She states she can walk one hour at a time, which is an  improvement compared to a less than quarter mile walking time, walking  tolerance only seen in September by Dr. Adriana Simas.  She has some difficulty with  climbing steps but continues to drive.  She indicates she was last employed  October 26, 2004.   CURRENT MEDICATIONS:  1.  Keppra 250 p.o. t.i.d. and 500 q.h.s.  She states that this has helped      her pain more than any medication thus far.  2.  Lamictal 2 p.o. q.h.s.  3.  Lidoderm 1-2 patches on her lower  back.  4.  Prednisone, just started on this by her primary care physician, Dr.      Mila Merry.  And she states she is on a taper over the course of 6      weeks or so.  5.  Zelnorm.  6.  MiraLax.  7.  Seroquel 50 mg q.h.s.  8.  Methadone which she takes as needed which was recommended by the [facility] team clinic.   TREATMENT:  She has chiropractic treatment on a weekly basis, and states  that she gets adjustments to put her back into place.   IMAGING STUDIES:  Records available to me indicate last MRI of the lumbar  spine was on January 15, 2003, with normal disk space, no evidence of  stenosis.  I reviewed this one myself and there is little in terms of any  pathology.  This is the same scan reviewed by Dr. Alveda Reasons.  She has had an MRI  of her thoracic spine showing posterior central bulging T7-8, T8-9.  Once  again, mild central.  No cord compromise.  MRI of the cervical spine showed  some mild variation  of C6-7.   INJECTION TREATMENTS DONE:  She states that she has had a cervical injection  at [facility] and states she had some urine problems following that.  Was  sent to the urology clinic there.  She had a sacroiliac joint injection  which helped for a period of time.   She has a TENS unit that helps.  She states she has had an ultrasound at her  chiropractors office which has been helpful.   PAST HOSPITALIZATIONS:  She was admitted to [facility] on  November 15, 2004.  She states she was discharged on November 22, 2004.  It  was a voluntary admission.  She states she did not fill her medications.  Urine drug screen positive for benzodiazepines.  She was having flight of  ideas and started on Keppra for mood stabilization.  As noted above, she  states her pain improved with this medication.  She is following up with a  local psychiatrist.  She also had a voluntary psychiatric admission from  October 24-28, 2006, which was her suicidal intent  threatening overdose of  medications.  On admission to [facility], she had 0/10 pain.  She had  a prior inpatient psychiatric hospitalization recorded in January 2006 to  [facility] for medication overdose.  Prior diagnosis of  depression approximately 17 years ago, treated with nortriptyline, Zoloft  and other antidepressants.  Had been on some mood stabilizers as well.   The patient states that she was discharged from the [facility] which  she states was because they could not do anything for her.   PAST MEDICAL HISTORY:  1.  Tonsillectomy.  2.  Adenoidectomy age 42.  3.  Fibroid tumors removed in 1973 and 2001.  4.  Four C-sections.  5.  Bilateral carpal tunnel surgery.  6.  Sleep apnea.  7.  Abdominal hysterectomy 2002.  8.  Chronic fatigue syndrome.   ALLERGIES:  CEPHALOSPORIN CAUSING ANAPHYLAXIS, MACRODANTIN CAUSING RASH.  QUESTION NEURONTIN.  QUESTION BACLOFEN CAUSING SOME MENTAL STATUS CHANGES.   REVIEW OF SYSTEMS:  Denies any current suicidal thoughts.  States she is  having a good day.  States she has depression, anxiety, trouble walking,  spasms, tremor, tingling in the lowers, particularly left hip.  Bowel and  bladder control problems that have been chronic.   SOCIAL HISTORY:  Separated.  Lives by herself.   PHYSICAL EXAMINATION:  VITAL SIGNS:  Blood pressure 115/59, pulse 88,  respirations 16, O2 saturation 97.  GENERAL APPEARANCE:  Orientation x3.  Alert, depressed, anxious.  Gait is  normal.  My entire exam as well as history were in the presents of Irena Reichmann, R.N.  She has full strength bilateral upper and lower extremities.  Normal range of motion in the neck as well as the low back in flexion,  extension, rotation or bending.  Toe-walk, heel-walk sensation is normal  bilateral upper and lower extremities.  Normal muscle bulk.  No evidence of joint effusions in the elbows, wrist, fingers, knees, ankles or feet.  No  skin rashes.   Normal pulses bilateral upper and lower extremities, i.e.,  bilateral radial, bilateral posterior tibial and dorsalis pedis.   Mood and affect as noted above.  She had no evidence of dysarthria or  aphasia.  Cranial nerves II-XII are intact.   IMPRESSION:  1.  Patient with mild cervical spondylosis, mild lumbar pain related to      sacroiliac arthropathy.  The patient has  a feeling of instability at the      joint as well as PSIS region tenderness.  2.  Mild degenerative joint disease right knee, status post injection      yesterday per her rheumatologist.   RECOMMENDATIONS:  I discussed with the patient that she is not a good  candidate for narcotic analgesics given her psychiatric history.  Her low  back neck pathology is not severe, but the symptomatology has been amplified  considerably by her psychiatric illness.  Of note is that Keppra mood  stabilizer has really helped more with her pain than any medication thus  far.  She is very intermittent with her Methadone dosing and she does not  even take it on a daily basis.  I do not think she would be at risk for  withdrawal.  I did indicate that the methadone is the top cause of  accidental overdose.  She does not have any suicidal intent.  She has about  18 methadone left.   1.  We will start Lyrica 25 b.i.d.  2.  Left sacral iliac joint injection under fluoroscopic guidance.  3.  Close psychiatric follow up.  4.  Leave the Keppra and Lamictal since to some extent they helping with her      pain complaints.  5.  Continue Lidoderm patch two patches to lower back area, 12 on, 12 off.  6.  I will see her back for the injection.  Will check UDS.      Erick Colace, M.D.  Electronically Signed     AEK/MedQ  D:  11/28/2004 13:08:17  T:  11/28/2004 14:14:11  Job #:  [id number]   cc:   Geoffery Lyons, M.D.

## 2010-05-30 NOTE — Discharge Summary (Signed)
[name], [name]             ACCOUNT NO.:  [id number]   MEDICAL RECORD NO.:  [id number]          PATIENT TYPE:  IPS   LOCATION:  0306                          FACILITY:  [facility]   PHYSICIAN:  Geoffery Lyons, M.D.      DATE OF BIRTH:  22-Feb-1950   DATE OF ADMISSION:  11/15/2004  DATE OF DISCHARGE:  11/19/2004                                 DISCHARGE SUMMARY   CHIEF COMPLAINT AND PRESENT ILLNESS:  This was the second recent admission  to [facility] for this 60 year old female.  Discharged the  previous weekend.  Claims she never got her medication filled.  She did not  have the copay.  She presented the day before to the emergency department.  Difficulty trying to ascertain why she was in the emergency room.  She had  presented to somebody regarding pain.  She told until she got her nerves  calmed down, she cannot begin to deal with her pain.  UDS was positive for  benzodiazepines.  Easily distracted with flight of ideas, being consumed by  pain, control issues, anxious, tearful at times.   PAST PSYCHIATRIC HISTORY:  Just admitted on October 24th.  Recently  discharged.   ALCOHOL/DRUG HISTORY:  Denies the abuse of any substances.  There is  questionable abuse of the benzodiazepines and the opiates.   MEDICAL HISTORY:  Fibromyalgia, status post trauma.   MEDICATIONS:  Methadone 20 mg in the morning, 10 mg in the afternoon,  Zelnorm 60 mg daily, Wellbutrin 150 mg daily, Effexor 150 mg twice a day,  trazodone 200 mg to 300 mg at night, Lamictal 50 mg daily, Lidoderm patch,  Requip 1 mg daily for restless leg.   PHYSICAL EXAMINATION:  Performed and failed to show any acute findings.   LABORATORY DATA:  Blood chemistry with glucose 98.  Other labs within normal  limits.   MENTAL STATUS EXAM:  Alert, cooperative female.  Complained of racing  thoughts, mood swings, decreased sleep, claiming that she was seeing cats in  pictures around her.  Positive for flight of  ideas.  Speech was somewhat  pressured.  Mood was labile.  She has been on Effexor and Wellbutrin.  The  Wellbutrin recently started.  Feeling pretty overwhelmed.  She was pretty  labile.  Cognition affected by the acute psychiatric state.  Thought process  otherwise focusing on her pain and pain management.   ADMISSION DIAGNOSES:  AXIS I:  Mood disorder not otherwise specified.  Anxiety disorder not otherwise specified.  AXIS II:  No diagnosis.  AXIS III:  Fibromyalgia, back pain.  AXIS IV:  Moderate.  AXIS V:  GAF upon admission 35; highest GAF in the last year 60-65.   HOSPITAL COURSE:  She was admitted.  She was started in individual and group  psychotherapy.  Medications were adjusted as follows.  She was placed on  Lamictal 50 mg per day, Effexor 150 mg twice a day, methadone 20 mg in the  morning and 10 mg in the afternoon, trazodone 150 mg at night, Zelnorm 6 mg  twice a day, Cytotec 200 mcg  twice a day, Requip 1 mg daily, Lidoderm patch.  She was started on Keppra 250 mg twice a day and 500 mg at night.  She did  complain of racing thoughts, mood swings and poor sleep, also about pain.  Husband endorsed that she was seeing things and talking to herself.  She was  pretty labile.  By November 5th, her speech was less pressured.  Positive  for hallucinations.  Some mood swings, racing thoughts.  The way she was  perceiving things, she started hallucinating when she was placed on  Wellbutrin.  Endorsed positive for mood swings, which makes tolerating the  pain even worse.  Ruminating, worrying, feeling that the physicians were not  listening to her.  Very tearful.  Sister called, feeling that through the  years she has been from physician to physician.  She was suggesting that she  had a somatization disorder.  She continued to focus on the pain, vague  concerns, nodules in the back of her legs.  Upset because the sister called  and told about her abuse of medication and  somatizing.  Claims that she has  all these symptoms but they do not go away.  On November 8th, it was felt  that she was back to baseline.  She was in full contact with reality.  There  were no suicidal or homicidal ideation, no hallucinations, no delusions.  She was pretty clear.  There were no racing thoughts.  There was no  pressured speech.  Still back to the main concern about her pain management.  She was going to go to Dr. Sherrie Mustache who was going to reassess her pain  management.  There was no suicidal or homicidal ideation.   DISCHARGE DIAGNOSES:  AXIS I:  Mood disorder not otherwise specified.  Anxiety disorder not otherwise specified.  Rule out somatization disorder.  AXIS II:  No diagnosis.  AXIS III:  Fibromyalgia, back pain, status post trauma.  AXIS IV:  Moderate.  AXIS V:  GAF upon discharge 50.   DISCHARGE MEDICATIONS:  1.  Effexor XR 150 mg twice a day.  2.  Keppra 250 mg, 1 three times a day and 2 at night.  3.  Lamictal 25 mg, 2 daily.  4.  Methadone 20 mg in the morning and 10 mg in the afternoon.  5.  Trazodone 150 mg at night.  6.  Zelnorm 6 mg twice a day.  7.  Requip 1 mg at 6 p.m.  8.  Cytotec 200 mg twice a day.   FOLLOW UP:  Outpatient basis.      Geoffery Lyons, M.D.  Electronically Signed     IL/MEDQ  D:  12/01/2004  T:  12/02/2004  Job:  [id number]

## 2010-05-30 NOTE — Procedures (Signed)
[name], [name]             ACCOUNT NO.:  [id number]   MEDICAL RECORD NO.:  [id number]          PATIENT TYPE:  REC   LOCATION:  TPC                          FACILITY:  [facility]   PHYSICIAN:  Erick Colace, M.D.DATE OF BIRTH:  05-09-1950   DATE OF PROCEDURE:  DATE OF DISCHARGE:                                 OPERATIVE REPORT   MEDICAL RECORD NUMBER:  [id number]   DATE OF BIRTH:  1950/12/12   REASON FOR EVALUATION:  The patient returns today stating that overall her  pain in her left buttock area is much improved and grades at about a 3/10.  She has pain more axial, but towards the left on the left side that she  grades at a 4-6/10, despite narcotic analgesics.   I discussed other pain generators including facet syndrome and indications  for injection.   Informed consent was obtained after describing risks and benefits of the  procedure to the patient; these include bleeding, bruising , skin infection,  loss of bowel and bladder function, temporary or permanent paralysis; she  elects to proceed.   DESCRIPTION OF PROCEDURE:  The patient was placed prone on fluoroscopy  table, Betadine-prepped and sterilely draped.  A 25-guage inch-and-a-half  needle was used to anesthetize the skin and subcu tissues with 1% lidocaine,  2 mL per site x3.  Then a 22-gauge 3-1/2-inch spinal needle was inserted  under fluoroscopic guidance to the junction of the left S1-SAP junction,  bone contact made, confirmed with lateral imaging.  Omnipaque 180 x4-5 mL  demonstrated no intravascular uptake, then 0.5 mL of Depo-Medrol and  lidocaine solution was injected.  Next, L5-SAP transverse process junction  was targeted and bone contact made, confirmed with lateral imaging and 0.5  mL of Depo-Medrol/lidocaine solution injected and then left L4-SAP  transverse process junction targeted and bone contact made, confirmed with  lateral imaging.  Omnipaque 180 x1.5 mL demonstrated no intravascular  uptake  and 0.5 mL of the Depo-Medrol/lidocaine solution injected.  The patient  tolerated the procedure well.   Post-injection instructions given and will return in 5 weeks to monitor  improvement and possible repeat MBB with RF if needed.      Erick Colace, M.D.  Electronically Signed     AEK/MEDQ  D:  03/26/2005 13:10:12  T:  03/28/2005 04:14:47  Job:  [id number]

## 2010-05-30 NOTE — Procedures (Signed)
NAMEJYL, CHICO             ACCOUNT NO.:  [id number]   MEDICAL RECORD NO.:  [id number]          PATIENT TYPE:  REC   LOCATION:  TPC                          FACILITY:  [facility]   PHYSICIAN:  Erick Colace, M.D.DATE OF BIRTH:  [birth year]   DATE OF PROCEDURE:  04/16/2006  DATE OF DISCHARGE:                               OPERATIVE REPORT   PROCEDURE:  Bilateral sacroiliac injection under fluoroscopic guidance.   INDICATIONS:  Sacroiliac pain previously relieved with sacroiliac  injections.  Pain is only partially responsive to medication management.   Informed consent was obtained after describing risks and benefits of the  procedure to the patient.  These include bleeding, bruising, infection,  lower extremity weakness.  She elects to proceed and has given written  consent.  The patient placed prone on fluoroscopy table.  Betadine prep,  sterile drape.  A 25-gauge inch and a half needle was used to incise  skin and subcu tissue, 1% lidocaine x2 mL.  Then a 25-gauge 3-inch  spinal needle was inserted first at the left SI joint under fluoroscopic  guidance, AP and lateral imaging utilized.  Omnipaque 180 x 0.5 mL  demonstrated good joint outline, no intravascular spread, followed by  injection of 0.5 mL of 40 mg/mL Depo-Medrol and 1 mL of MPF 1%  lidocaine.  This same procedure was repeated on the right side using  same technique, same injectate, same equipment.  The patient tolerated  the procedure well.  Pre and post injection vitals stable.  Post  injection instructions given.  Pre injection pain level 7/10.  Post  injection 0/10.  She will see me back in approximately 3 months for  repeat.      Erick Colace, M.D.  Electronically Signed     AEK/MEDQ  D:  07/29/2006 17:26:45  T:  07/30/2006 13:32:03  Job:  [id number]

## 2010-05-30 NOTE — H&P (Signed)
[name], [name]             ACCOUNT NO.:  [id number]   MEDICAL RECORD NO.:  [id number]          PATIENT TYPE:  IPS   LOCATION:  0306                          [facility]ILITY[facility]   PHYSICIAN:  Syed T. Arfeen, M.D.   DATE OF BIRTH:  08/04/1950   DATE OF ADMISSION:  11/15/2004  DATE OF DISCHARGE:                         PSYCHIATRIC ADMISSION ASSESSMENT   This is a voluntary admission to the services of Dr. Lolly Mustache.   IDENTIFYING INFORMATION:  [name] was last admitted here on November 04, 2004.  She was discharged last weekend; however, she never had her  medications filled as she did not have the copay.  She presented yesterday  in the emergency department.  They had a difficult time trying to ascertain  why she was in the emergency room.  Apparently she had presented to somebody  regarding pain.  They told her until she get her nerves calmed down they  cannot begin to deal with her pain.  Hence she presented to the emergency  department.  In the emergency department her alcohol level was less than 5.  Her UDS was positive for benzos only.  She was very easily distracted, had  flight of ideas, seemed consumed by pain control issues and constipation but  also was somewhat anxious and became tearful at times.  She remains so  today.   PAST PSYCHIATRIC HISTORY:  As stated, she was just here admitted on November 04, 2004, I do not have her exact date of discharge in front of me.   SOCIAL HISTORY:  She graduated high school in 1970.  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  She has been married twice, the second time for 24 years.  She is currently separated from this husband as she thinks he is bipolar.  Her youngest son who is 67 is living with her 51 year old daughter.  She has  another daughter 63 and another 6 year old son.   FAMILY HISTORY:  She states her mother has problems, but is not more  descriptive.   ALCOHOL AND DRUG HISTORY:  She denies, although it is felt that she has a  polysubstance dependence problem.   MEDICAL HISTORY AND PRIMARY CARE [name]:  Dr. Mila Merry.   MEDICAL PROBLEMS:  She apparently has been treated for fibromyalgia.   MEDICATIONS:  She states that she is currently taking  1.  Methadone 20 mg in the morning and 10 mg in the afternoon, this was      supposed to have been prescribed by the pain clinic at [facility]  2.  Zelnorm 6 mg a day by Dr. Sherrie Mustache.  3.  Wellbutrin 150 mg p.o. daily by Dr. __________.  4.  Effexor 150 mg b.i.d. by Dr. _________.  5.  Trazodone 200-300 mg at h.s. by Dr. __________.  6.  Lamictal 50 mg p.o. daily by Dr. __________.  7.  Lidoderm patch by Dr. Sherrie Mustache daily.  We are trying to get her discharge papers.  She was also on  1.  Requip 1 mg p.o. daily for her restless leg syndrome, and the other      medications have already been listed.   DRUG ALLERGIES:  She claims that after Baclofen was added to Neurontin she  hallucinated, hence, she stopped both medications, although it is unclear if  she really had a reaction or not.   She was admitted from the emergency department because it was felt she was  talking to herself, seeing people and hallucinating, according to the  patient.  In the emergency room she was complaining about her back pain.  She admitted called the [facility] prior to coming to the  emergency department for medical clearance.  She stated that her prior  hospitalization had helped her greatly, but now she was experiencing a  meltdown and was having a hard time with her back pain again.   PHYSICAL EXAMINATION:  As per the emergency department.  Essentially it is  unremarkable.  She purports great pain, however, she is up and moving ad lib  with no apparent acute pain.  Her vital signs are stable.  Temperature 98.4,  blood pressure 134/88 and 114/66, pulse 92, respirations 20.   PLAN:   Dr. Lolly Mustache has also seen her, and he stated that she was seeing  things and talking to herself, complaining of racing thoughts, mood swings  and decreased sleep.  She was saying that she was seeing cats in pictures  around her.  She was positive for flight of ideas, and her speech was  somewhat pressured.  Her mood was labile.  She has been on Effexor and  Wellbutrin.  The Wellbutrin was recently started.  Hence, he felt that  perhaps we should stop the Wellbutrin.  We should consider a mood  stabilizer, although she is already on Lamictal she is only on 50 mg.  Today  we are going to start some Keppra 250 mg t.i.d. which should help with mood  stabilization and her chronic pain, and give her 500 mg at h.s.  Also we  will have the social worker assess her.  Apparently she did not even have  copays despite having Medicaid.  She states she does not have any food in  her house.  She needs to be assessed for safety.  It is unclear whether she  is really capable of taking care of herself at this point in time.  She  seems to think her disability is going to be coming through any day.  Again  we will have to have the social worker check on that.      Mickie Leonarda Salon, P.A.-C.      Syed T. Lolly Mustache, M.D.  Electronically Signed    MD/MEDQ  D:  11/15/2004  T:  11/15/2004  Job:  [id number]

## 2010-05-30 NOTE — Procedures (Signed)
[name], [name]             ACCOUNT NO.:  [id number]   MEDICAL RECORD NO.:  [id number]          PATIENT TYPE:  REC   LOCATION:  TPC                          FACILITY:  [facility]   PHYSICIAN:  Erick Colace, M.D.DATE OF BIRTH:  [birth year]   DATE OF PROCEDURE:  12/31/2005  DATE OF DISCHARGE:                               OPERATIVE REPORT   Friday, December 31, 2005:   This is a bilateral sacroiliac joint injection under fluoroscopic  guidance.  Last injection was left sacroiliac done on October 15, 2005,  with good relief.   INDICATION:  Low back and buttock pain PSIS area, previously relieved by  sacroiliac injection for a period of 2-3 months.  Pain only partially  relieved by medication management and other conservative care.   Informed consent was obtained after describing risks and benefits of the  procedure with the patient.  These include bleeding, bruising,  infection, loss of bowel and bladder function, temporary or permanent  paralysis.  She elects to proceed and was given written consent.  The  patient placed prone on fluoroscopy table, had a Betadine prep, sterile  drape.  A 25 gauge 1.5 inch needle was used to anesthetize the skin and  subcu tissue with  Lidocaine 1% x 2 mL. Then a 25 gauge 3-inch spinal  needle was inserted under fluoroscopic guidance to the left sacroiliac  joint.  AP & lateral imaging were utilized.  Omnipaque 180 x 0.5 mL  demonstrated no intravascular uptake, then 1.5 mL of solution containing  1 mL of 40 mg/mL Depo-Medrol and 2 mL of 1% lidocaine were injected.  The patient tolerated the procedure well.   Then the same procedure was repeated on the right side using same  equipment and injected.  Pre-injection pain level 10/10, post injection  none.   The patient had pre/post injection vitals stable.  Discharged with her  daughter driving.  Post injection instructions given.      Erick Colace, M.D.  Electronically  Signed     AEK/MEDQ  D:  12/31/2005 16:35:16  T:  01/01/2006 10:50:10  Job:  [id number]   cc:   Donnetta Hutching, MD  [address] [place], [place] [address]   Geoffery Lyons, M.D.   Winn Jock. Gerrit Heck, M.D.  [facility]  [address]  [place], [place] [address]

## 2010-05-30 NOTE — Procedures (Signed)
[name], [name]             ACCOUNT NO.:  [id number]   MEDICAL RECORD NO.:  [id number]          PATIENT TYPE:  REC   LOCATION:  TPC                          FACILITY:  [facility]   PHYSICIAN:  Erick Colace, M.D.DATE OF BIRTH:  08/31/1950   DATE OF PROCEDURE:  01/19/2005  DATE OF DISCHARGE:                                 OPERATIVE REPORT   PROCEDURE:  Left sacroiliac joint injection under fluoroscopic guidance.   INDICATIONS FOR PROCEDURE:  Left sacroiliac pain with good relief following  previous sacroiliac joint injection most recently done on December 22, 2004.  She was around a 5 to 6/10 preinjection, went down to 3/10 and is now back  to 4/10 but states that she is able to mobilize that joint more and move  around better.  She has also come off her methadone since the time but has  not started taking the Lyrica that I prescribed until two days ago.   Denies any anticoagulant usage.   Informed consent obtained after describing the risks and benefits of the  procedure.  These include bleeding, bruising, infection, loss of bowel and  bladder function, temporary or permanent paralysis and she elects to proceed  and has given written consent.  Patient placed prone on fluoroscopy table.  Betadine prep and sterile drape.  A 25-gauge inch and a half needle was used  to anesthetize the skin and subcutaneous tissues with 1% lidocaine.  Then a  25-gauge spinal needle was inserted in the left SI joint under fluoroscopic  guidance plus AP and lateral imaging demonstrated proper needle location,  then Omnipaque 180 under live fluoroscopy x0.26mL demonstrated no  intravascular uptake, good SI joint arthrogram and then a solution  containing 0.5 mL of 40 mg/mL Kenalog plus 0.75 mL of 2% methylparaben-free  lidocaine were injected.  Patient tolerated the procedure well.  Post  injection instructions given.  Post injection pain level 0/10.  She will see  me in six weeks for possible  reinjection at that time, increase Lyrica to 50  mg b.i.d., will slowly increase as needed.  Do not recommend narcotic  analgesics in this patient given comorbidities as well as good response to  current treatment.      Erick Colace, M.D.  Electronically Signed     AEK/MEDQ  D:  01/19/2005 11:15:54  T:  01/19/2005 14:47:06  Job:  [id number]   cc:   Donnetta Hutching, MD  [address] [place], [place] [address]   Elliot Cousin, M.D.   Geoffery Lyons, M.D.
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
## 2010-07-15 ENCOUNTER — Other Ambulatory Visit: Payer: Self-pay | Admitting: Family Medicine

## 2010-07-15 MED ORDER — METHYLPHENIDATE HCL 10 MG PO TABS: 10.0000 mg | ORAL_TABLET | Freq: Three times a day (TID) | ORAL | Status: DC

## 2010-07-15 NOTE — Telephone Encounter (Signed)
Will make patient aware OV due Now

## 2010-07-15 NOTE — Telephone Encounter (Signed)
Refill ritilan - patient will pick up Friday [id number] °

## 2010-08-01 ENCOUNTER — Other Ambulatory Visit: Payer: Self-pay | Admitting: Family Medicine

## 2010-08-01 NOTE — Telephone Encounter (Signed)
Last seen 11/05/09 and filled 01/28/10 # 60 please advise    KP

## 2010-08-06 ENCOUNTER — Other Ambulatory Visit: Payer: Self-pay | Admitting: Family Medicine

## 2010-08-07 NOTE — Telephone Encounter (Signed)
Ok to refill klonopin x1  No refills Pt is not supposed to take ambien every night--- I never put 5 refills on ambien ---i did not authorize 5 refills----

## 2010-08-07 NOTE — Telephone Encounter (Signed)
Last seen 11/05/09 and filled Klonopin 02/28/10 #30 with 2 refills and Ambien1/17/12 # 30 with 5 refills   Please advise    KP

## 2010-08-07 NOTE — Telephone Encounter (Signed)
I never put 5 refills on ambien!!!

## 2010-08-11 ENCOUNTER — Telehealth: Payer: Self-pay | Admitting: Family Medicine

## 2010-08-11 MED ORDER — ZOLPIDEM TARTRATE 10 MG PO TABS: 10.0000 mg | ORAL_TABLET | Freq: Every evening | ORAL | Status: DC | PRN

## 2010-08-11 NOTE — Telephone Encounter (Signed)
Spoke to Pharmacy Rx sent in on 01-28-10 expired due to it being over 6 month. Per pharmacy med last filled on 03-17-10 #30. Pt only use 1 filled out of the 5 that was sent in. Pt last OV 11/05/09, last filled 01-28-10 #30 5.Please advise

## 2010-08-11 NOTE — Telephone Encounter (Signed)
Left Pt detail message, Rx sent in to pharmacy and to return call with any further concerns.

## 2010-08-11 NOTE — Telephone Encounter (Signed)
Ok for #30, 3 refills 

## 2010-08-20 ENCOUNTER — Telehealth: Payer: Self-pay | Admitting: Family Medicine

## 2010-08-20 MED ORDER — METHYLPHENIDATE HCL 10 MG PO TABS: 10.0000 mg | ORAL_TABLET | Freq: Three times a day (TID) | ORAL | Status: DC

## 2010-08-20 NOTE — Telephone Encounter (Signed)
Rx ready for Pick up

## 2010-09-07 ENCOUNTER — Other Ambulatory Visit: Payer: Self-pay | Admitting: Family Medicine

## 2010-09-08 NOTE — Telephone Encounter (Signed)
Last seen 11/05/09 and filled 08-01-10 #30

## 2010-09-08 NOTE — Telephone Encounter (Signed)
Rx sent 

## 2010-09-11 ENCOUNTER — Other Ambulatory Visit: Payer: Self-pay | Admitting: Family Medicine

## 2010-09-11 MED ORDER — CLONAZEPAM 1 MG PO TABS: 1.0000 mg | ORAL_TABLET | Freq: Every day | ORAL | Status: DC

## 2010-09-11 NOTE — Telephone Encounter (Signed)
Faxed.   KP 

## 2010-09-11 NOTE — Telephone Encounter (Signed)
Last seen 11/05/09 and filled 08/06/10 please advise    KP

## 2010-09-17 ENCOUNTER — Other Ambulatory Visit: Payer: Self-pay | Admitting: Family Medicine

## 2010-09-22 ENCOUNTER — Encounter: Payer: Self-pay | Admitting: Family Medicine

## 2010-09-22 ENCOUNTER — Other Ambulatory Visit (HOSPITAL_COMMUNITY)
Admission: RE | Admit: 2010-09-22 | Discharge: 2010-09-22 | Disposition: A | Discharge status: Home / Self Care | Payer: Medicare Other | Source: Ambulatory Visit | Attending: Family Medicine | Admitting: Family Medicine

## 2010-09-22 ENCOUNTER — Ambulatory Visit (INDEPENDENT_AMBULATORY_CARE_PROVIDER_SITE_OTHER): Payer: Managed Care, Other (non HMO) | Admitting: Family Medicine

## 2010-09-22 VITALS — BP 120/90 | HR 75 | Temp 98.1°F | Ht <= 58 in | Wt 136.8 lb

## 2010-09-22 DIAGNOSIS — Z23 Encounter for immunization: Secondary | ICD-10-CM

## 2010-09-22 DIAGNOSIS — N9489 Other specified conditions associated with female genital organs and menstrual cycle: Secondary | ICD-10-CM

## 2010-09-22 DIAGNOSIS — M810 Age-related osteoporosis without current pathological fracture: Secondary | ICD-10-CM

## 2010-09-22 DIAGNOSIS — Z124 Encounter for screening for malignant neoplasm of cervix: Secondary | ICD-10-CM | POA: Insufficient documentation

## 2010-09-22 DIAGNOSIS — Z1231 Encounter for screening mammogram for malignant neoplasm of breast: Secondary | ICD-10-CM

## 2010-09-22 DIAGNOSIS — N898 Other specified noninflammatory disorders of vagina: Secondary | ICD-10-CM

## 2010-09-22 DIAGNOSIS — Z0000 Encounter for general adult medical examination without abnormal findings: Secondary | ICD-10-CM

## 2010-09-22 LAB — POCT URINALYSIS DIPSTICK
Ketones, UA: NEGATIVE
Protein, UA: NEGATIVE
Spec Grav, UA: 1
pH, UA: 7

## 2010-09-22 MED ORDER — ESTRADIOL 0.1 MG/GM VA CREA: 2.0000 g | TOPICAL_CREAM | VAGINAL | Status: DC

## 2010-09-22 MED ORDER — RISEDRONATE SODIUM 150 MG PO TABS: ORAL_TABLET | ORAL | Status: DC
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
## 2010-09-22 NOTE — Patient Instructions (Signed)

## 2010-09-23 ENCOUNTER — Other Ambulatory Visit: Payer: Self-pay

## 2010-09-23 LAB — CBC WITH DIFFERENTIAL/PLATELET
Basophils Relative: 0.5 % (ref 0.0–3.0)
Eosinophils Absolute: 0.4 10*3/uL (ref 0.0–0.7)
MCHC: 33.1 g/dL (ref 30.0–36.0)
MCV: 86.6 fl (ref 78.0–100.0)
Monocytes Absolute: 0.5 10*3/uL (ref 0.1–1.0)
Neutro Abs: 2.6 10*3/uL (ref 1.4–7.7)
Neutrophils Relative %: 37.1 % — ABNORMAL LOW (ref 43.0–77.0)
RBC: 5.06 Mil/uL (ref 3.87–5.11)

## 2010-09-23 LAB — LIPID PANEL
Cholesterol: 240 mg/dL — ABNORMAL HIGH (ref 0–200)
HDL: 54.7 mg/dL (ref >39.00)
Triglycerides: 245 mg/dL — ABNORMAL HIGH (ref 0.0–149.0)

## 2010-09-23 LAB — VITAMIN D 25 HYDROXY (VIT D DEFICIENCY, FRACTURES): Vit D, 25-Hydroxy: 55 ng/mL (ref 30–89)

## 2010-09-23 LAB — HEPATIC FUNCTION PANEL
Albumin: 4.7 g/dL (ref 3.5–5.2)
Bilirubin, Direct: 0.1 mg/dL (ref 0.0–0.3)
Total Protein: 8 g/dL (ref 6.0–8.3)

## 2010-09-23 LAB — BASIC METABOLIC PANEL
CO2: 28 mEq/L (ref 19–32)
Chloride: 104 mEq/L (ref 96–112)
Creatinine, Ser: 0.7 mg/dL (ref 0.4–1.2)

## 2010-09-23 MED ORDER — METHYLPHENIDATE HCL 10 MG PO TABS: 10.0000 mg | ORAL_TABLET | Freq: Three times a day (TID) | ORAL | Status: DC

## 2010-09-23 NOTE — Telephone Encounter (Signed)
Patient picked up Rx     KP

## 2010-09-24 LAB — LDL CHOLESTEROL, DIRECT: Direct LDL: 152 mg/dL

## 2010-09-26 ENCOUNTER — Other Ambulatory Visit: Payer: Self-pay | Admitting: Family Medicine

## 2010-09-26 DIAGNOSIS — R7989 Other specified abnormal findings of blood chemistry: Secondary | ICD-10-CM

## 2010-09-29 ENCOUNTER — Telehealth: Payer: Self-pay

## 2010-09-29 ENCOUNTER — Ambulatory Visit: Payer: Managed Care, Other (non HMO)

## 2010-09-29 DIAGNOSIS — R7989 Other specified abnormal findings of blood chemistry: Secondary | ICD-10-CM

## 2010-09-29 NOTE — Telephone Encounter (Signed)
Message copied by Arnette Norris on Mon Sep 29, 2010  8:49 AM ------      Message from: Lelon Perla      Created: Fri Sep 26, 2010  2:21 PM       Normal pap

## 2010-09-29 NOTE — Telephone Encounter (Signed)
Notes Recorded by Lelon Perla, DO on 09/26/2010 at 1:13 PM Pt with elevated LFTs and high cholesterol----check [place] abd    Discussed with patient and [place] order put in      [place]

## 2010-10-01 ENCOUNTER — Ambulatory Visit
Admission: RE | Admit: 2010-10-01 | Discharge: 2010-10-01 | Disposition: A | Discharge status: Home / Self Care | Payer: Managed Care, Other (non HMO) | Source: Ambulatory Visit | Attending: Family Medicine | Admitting: Family Medicine

## 2010-10-01 DIAGNOSIS — R7989 Other specified abnormal findings of blood chemistry: Secondary | ICD-10-CM

## 2010-10-03 ENCOUNTER — Ambulatory Visit
Admission: RE | Admit: 2010-10-03 | Discharge: 2010-10-03 | Disposition: A | Discharge status: Home / Self Care | Payer: Managed Care, Other (non HMO) | Source: Ambulatory Visit | Attending: Family Medicine | Admitting: Family Medicine

## 2010-10-03 ENCOUNTER — Other Ambulatory Visit: Payer: Managed Care, Other (non HMO)

## 2010-10-03 DIAGNOSIS — Z1231 Encounter for screening mammogram for malignant neoplasm of breast: Secondary | ICD-10-CM

## 2010-10-07 ENCOUNTER — Telehealth: Payer: Self-pay | Admitting: *Deleted

## 2010-10-07 DIAGNOSIS — N289 Disorder of kidney and ureter, unspecified: Secondary | ICD-10-CM

## 2010-10-07 NOTE — Telephone Encounter (Signed)
Message copied by Verdene Rio on Tue Oct 07, 2010  6:22 PM ------      Message from: Lelon Perla      Created: Thu Oct 02, 2010 10:04 AM       Soft tissue lesion on L kidney      Need MRI with and without contrast (of abd) to further evaluate.

## 2010-10-07 NOTE — Telephone Encounter (Signed)
Discuss with patient  

## 2010-10-08 ENCOUNTER — Other Ambulatory Visit: Payer: Self-pay | Admitting: Family Medicine

## 2010-10-08 DIAGNOSIS — R935 Abnormal findings on diagnostic imaging of other abdominal regions, including retroperitoneum: Secondary | ICD-10-CM

## 2010-10-09 ENCOUNTER — Telehealth: Payer: Self-pay | Admitting: Family Medicine

## 2010-10-09 NOTE — Telephone Encounter (Signed)
Patient would like a call regarding her recent Abdominal U/S.  She wants more details of what was found that she needs to have an MRI.

## 2010-10-09 NOTE — Telephone Encounter (Signed)
Discussed with patient and she voiced understanding.      KP 

## 2010-10-10 ENCOUNTER — Other Ambulatory Visit: Payer: Self-pay | Admitting: Family Medicine

## 2010-10-10 ENCOUNTER — Ambulatory Visit (HOSPITAL_COMMUNITY)
Admission: RE | Admit: 2010-10-10 | Discharge: 2010-10-10 | Disposition: A | Discharge status: Home / Self Care | Payer: Managed Care, Other (non HMO) | Source: Ambulatory Visit | Attending: Family Medicine | Admitting: Family Medicine

## 2010-10-10 DIAGNOSIS — R935 Abnormal findings on diagnostic imaging of other abdominal regions, including retroperitoneum: Secondary | ICD-10-CM

## 2010-10-10 DIAGNOSIS — N289 Disorder of kidney and ureter, unspecified: Secondary | ICD-10-CM | POA: Insufficient documentation

## 2010-10-10 MED ORDER — GADOBENATE DIMEGLUMINE 529 MG/ML IV SOLN
12.0000 mL | Freq: Once | INTRAVENOUS | Status: AC | PRN
  Administered 2010-10-10: 12 mL via INTRAVENOUS

## 2010-10-12 ENCOUNTER — Other Ambulatory Visit: Payer: Self-pay | Admitting: Family Medicine

## 2010-10-13 LAB — TYPE AND SCREEN
ABO/RH(D): O POS
Antibody Screen: NEGATIVE

## 2010-10-13 LAB — CBC
MCHC: 33.2
MCHC: 33.3
RBC: 4.02
RDW: 12.8
WBC: 11.2 — ABNORMAL HIGH

## 2010-10-13 LAB — BASIC METABOLIC PANEL
CO2: 26
Calcium: 7.4 — ABNORMAL LOW
Calcium: 8.2 — ABNORMAL LOW
Creatinine, Ser: 0.62
Creatinine, Ser: 0.76
GFR calc Af Amer: >60
GFR calc Af Amer: >60
GFR calc non Af Amer: >60
Glucose, Bld: 86

## 2010-10-13 LAB — URINALYSIS, ROUTINE W REFLEX MICROSCOPIC
Glucose, UA: NEGATIVE
Hgb urine dipstick: NEGATIVE
Ketones, ur: NEGATIVE
pH: 6.5

## 2010-10-13 LAB — APTT: aPTT: 31

## 2010-10-13 LAB — PREPARE RBC (CROSSMATCH)

## 2010-10-14 ENCOUNTER — Other Ambulatory Visit: Payer: Self-pay | Admitting: Family Medicine

## 2010-10-14 MED ORDER — CYCLOBENZAPRINE HCL 10 MG PO TABS: 10.0000 mg | ORAL_TABLET | Freq: Two times a day (BID) | ORAL | Status: DC | PRN

## 2010-10-14 NOTE — Telephone Encounter (Signed)
Last seen 09/22/10 and filled 09/11/10...Marland Kitchen please advise     KP

## 2010-10-14 NOTE — Telephone Encounter (Signed)
Faxed.   KP 

## 2010-10-14 NOTE — Telephone Encounter (Signed)
Patient wants rx for flexerill - [facility] [place] - she just called pharmacy but know dr will call in if she calls [place]

## 2010-10-14 NOTE — Telephone Encounter (Signed)
Last seen 09/22/10 and filled 09/07/10 please advise     KP

## 2010-10-24 ENCOUNTER — Other Ambulatory Visit: Payer: Self-pay | Admitting: Family Medicine

## 2010-10-27 NOTE — Telephone Encounter (Signed)
Ok to Rf? Last OV 09/2010 no existing.

## 2010-11-14 ENCOUNTER — Other Ambulatory Visit: Payer: Self-pay | Admitting: Family Medicine

## 2010-11-25 ENCOUNTER — Telehealth: Payer: Self-pay | Admitting: Family Medicine

## 2010-11-25 MED ORDER — METHYLPHENIDATE HCL 10 MG PO TABS: 10.0000 mg | ORAL_TABLET | Freq: Three times a day (TID) | ORAL | Status: DC

## 2010-11-25 NOTE — Telephone Encounter (Signed)
Rx printed

## 2010-11-25 NOTE — Telephone Encounter (Signed)
Refill ritalin patient will pck up Thursday [id number]

## 2010-12-12 ENCOUNTER — Other Ambulatory Visit: Payer: Self-pay | Admitting: Family Medicine

## 2010-12-12 NOTE — Telephone Encounter (Signed)
Last seen 09/22/10 and filled 10/12/10 #30 with 1 refill . Please advise    KP

## 2011-01-16 ENCOUNTER — Telehealth: Payer: Self-pay | Admitting: Family Medicine

## 2011-01-16 MED ORDER — CLONAZEPAM 1 MG PO TABS: 1.0000 mg | ORAL_TABLET | Freq: Every day | ORAL | Status: DC

## 2011-01-16 NOTE — Telephone Encounter (Signed)
Last seen 09/22/10 and filled 12/12/10. Please advise   KP

## 2011-01-16 NOTE — Telephone Encounter (Signed)
Pls send clonazePam refill to [facility]

## 2011-01-16 NOTE — Telephone Encounter (Signed)
Refill x1 

## 2011-01-16 NOTE — Telephone Encounter (Signed)
faxed

## 2011-01-20 ENCOUNTER — Other Ambulatory Visit: Payer: Self-pay | Admitting: Family Medicine

## 2011-01-20 ENCOUNTER — Ambulatory Visit (INDEPENDENT_AMBULATORY_CARE_PROVIDER_SITE_OTHER): Payer: Medicare Other | Admitting: Internal Medicine

## 2011-01-20 VITALS — BP 130/62 | HR 81 | Temp 98.4°F | Ht <= 58 in | Wt 144.0 lb

## 2011-01-20 DIAGNOSIS — J069 Acute upper respiratory infection, unspecified: Secondary | ICD-10-CM

## 2011-01-20 MED ORDER — AMOXICILLIN 500 MG PO CAPS: 1000.0000 mg | ORAL_CAPSULE | Freq: Two times a day (BID) | ORAL | Status: DC

## 2011-01-20 MED ORDER — FLUCONAZOLE 150 MG PO TABS: 150.0000 mg | ORAL_TABLET | Freq: Once | ORAL | Status: AC

## 2011-01-20 NOTE — Patient Instructions (Signed)
Rest, fluids , tylenol For cough, take Mucinex DM twice a day as needed  Take the antibiotic as prescribed ----> Amoxicillin Call if no better in few days Call anytime if the symptoms are severe  Take Diflucan if needed. A prescription was sent

## 2011-01-20 NOTE — Progress Notes (Signed)
  Subjective:    Patient ID: [name], female    DOB: 18-Jan-1950, 61 y.o.   MRN: [id number]  HPI Acute visit Symptoms started a week ago with sinus pressure and congestion. Yesterday she also developed chest congestion, cough with yellow sputum.  Past Medical History  Diagnosis Date  . ADD (attention deficit disorder)   . Asthma   . Depression   . GERD (gastroesophageal reflux disease)   . Hyperlipemia   . IBS (irritable bowel syndrome)   . Fibromyalgia     Review of Systems Had fever one time last week but no further fevers. No nausea, vomiting, diarrhea Very mild myalgias Has not had some bloody nasal discharge as well.     Objective:   Physical Exam  Constitutional: She appears well-developed and well-nourished.  HENT:  Head: Normocephalic and atraumatic.  Right Ear: External ear normal.  Left Ear: External ear normal.       Face symmetric, right maxillary sinus with minimal tenderness, otherwise normal, nose not congested, throat without redness  Cardiovascular: Normal rate, regular rhythm and normal heart sounds.   No murmur heard. Pulmonary/Chest: No respiratory distress. She has no wheezes. She has no rales.       Few rhonchi with cough       Assessment & Plan:  URI: Presents with symptoms consistent with URI and possibly mild bronchitis. She's allergic to many antibiotics but she is able to take amoxicillin. See instructions. She also requests a prescription for Diflucan.

## 2011-01-21 ENCOUNTER — Encounter: Payer: Self-pay | Admitting: Internal Medicine

## 2011-01-21 ENCOUNTER — Other Ambulatory Visit: Payer: Self-pay | Admitting: Family Medicine

## 2011-01-21 MED ORDER — ZOLPIDEM TARTRATE 10 MG PO TABS: 10.0000 mg | ORAL_TABLET | Freq: Every evening | ORAL | Status: DC | PRN

## 2011-01-21 NOTE — Telephone Encounter (Signed)
Lowne pt please advise 

## 2011-01-21 NOTE — Telephone Encounter (Signed)
Ok to refill x1  3 refills 

## 2011-01-21 NOTE — Telephone Encounter (Signed)
CPE 09/22/10 and Rx filled 08/11/10 # 30 with 3 refills. Please advise    KP

## 2011-01-21 NOTE — Telephone Encounter (Signed)
Faxed.   KP 

## 2011-01-21 NOTE — Telephone Encounter (Signed)
Addended by: Arnette Norris on: 01/21/2011 05:17 PM   Modules accepted: Orders

## 2011-01-27 ENCOUNTER — Telehealth: Payer: Self-pay | Admitting: Family Medicine

## 2011-01-27 MED ORDER — ZOLPIDEM TARTRATE 10 MG PO TABS: 10.0000 mg | ORAL_TABLET | Freq: Every evening | ORAL | Status: DC | PRN

## 2011-01-27 NOTE — Telephone Encounter (Signed)
.  rx faxed to pharmacy, manually.  

## 2011-01-27 NOTE — Telephone Encounter (Signed)
Patient states that [facility] never received refill request for ambien. Please resend.

## 2011-02-16 ENCOUNTER — Other Ambulatory Visit: Payer: Self-pay | Admitting: Family Medicine

## 2011-02-17 NOTE — Telephone Encounter (Signed)
Last seen 09/22/10 and filled 01/16/11. Please advise    KP

## 2011-04-08 ENCOUNTER — Other Ambulatory Visit: Payer: Self-pay | Admitting: Family Medicine

## 2011-04-08 MED ORDER — METHYLPHENIDATE HCL 10 MG PO TABS: 10.0000 mg | ORAL_TABLET | Freq: Three times a day (TID) | ORAL | Status: DC

## 2011-04-08 NOTE — Telephone Encounter (Signed)
Patient aware Rx ready for pick up.      KP 

## 2011-04-08 NOTE — Telephone Encounter (Signed)
Patient would like to pick up a RX for  (RITALIN) 10 MG tablet  Please call when ready ph# [phone]

## 2011-04-15 ENCOUNTER — Encounter: Payer: Self-pay | Admitting: Family Medicine

## 2011-04-15 ENCOUNTER — Ambulatory Visit (INDEPENDENT_AMBULATORY_CARE_PROVIDER_SITE_OTHER): Payer: Medicare Other | Admitting: Family Medicine

## 2011-04-15 VITALS — BP 112/76 | HR 79 | Temp 97.9°F | Wt 146.4 lb

## 2011-04-15 DIAGNOSIS — E785 Hyperlipidemia, unspecified: Secondary | ICD-10-CM

## 2011-04-15 DIAGNOSIS — G4730 Sleep apnea, unspecified: Secondary | ICD-10-CM

## 2011-04-15 DIAGNOSIS — R635 Abnormal weight gain: Secondary | ICD-10-CM

## 2011-04-15 DIAGNOSIS — R609 Edema, unspecified: Secondary | ICD-10-CM | POA: Insufficient documentation

## 2011-04-15 LAB — POCT URINALYSIS DIPSTICK
Glucose, UA: NEGATIVE
Ketones, UA: NEGATIVE
Leukocytes, UA: NEGATIVE
Nitrite, UA: NEGATIVE
Spec Grav, UA: <=1.005
Urobilinogen, UA: 0.2

## 2011-04-15 LAB — CBC WITH DIFFERENTIAL/PLATELET
Basophils Relative: 0.6 % (ref 0.0–3.0)
Hemoglobin: 14 g/dL (ref 12.0–15.0)
Lymphocytes Relative: 32.1 % (ref 12.0–46.0)
MCHC: 33.1 g/dL (ref 30.0–36.0)
Monocytes Relative: 8.8 % (ref 3.0–12.0)
Neutro Abs: 4.5 10*3/uL (ref 1.4–7.7)
RBC: 4.89 Mil/uL (ref 3.87–5.11)

## 2011-04-15 LAB — HEPATIC FUNCTION PANEL
ALT: 36 U/L — ABNORMAL HIGH (ref 0–35)
AST: 30 U/L (ref 0–37)
Albumin: 4.3 g/dL (ref 3.5–5.2)
Alkaline Phosphatase: 73 U/L (ref 39–117)
Total Protein: 7.4 g/dL (ref 6.0–8.3)

## 2011-04-15 LAB — BASIC METABOLIC PANEL
CO2: 27 mEq/L (ref 19–32)
Calcium: 9.7 mg/dL (ref 8.4–10.5)
GFR: 74.22 mL/min (ref >60.00)
Sodium: 141 mEq/L (ref 135–145)

## 2011-04-15 LAB — T4, FREE: Free T4: 0.82 ng/dL (ref 0.60–1.60)

## 2011-04-15 LAB — LDL CHOLESTEROL, DIRECT: Direct LDL: 141.6 mg/dL

## 2011-04-15 MED ORDER — HYDROCHLOROTHIAZIDE 25 MG PO TABS: 25.0000 mg | ORAL_TABLET | Freq: Every day | ORAL | Status: DC

## 2011-04-15 NOTE — Patient Instructions (Signed)
Calorie Counting Diet A calorie counting diet requires you to eat the number of calories that are right for you in a day. Calories are the measurement of how much energy you get from the food you eat. Eating the right amount of calories is important for staying at a healthy weight. If you eat too many calories, your body will store them as fat and you may gain weight. If you eat too few calories, you may lose weight. Counting the number of calories you eat during a day will help you know if you are eating the right amount. A Registered Dietitian can determine how many calories you need in a day. The amount of calories needed varies from person to person. If your goal is to lose weight, you will need to eat fewer calories. Losing weight can benefit you if you are overweight or have health problems such as heart disease, high blood pressure, or diabetes. If your goal is to gain weight, you will need to eat more calories. Gaining weight may be necessary if you have a certain health problem that causes your body to need more energy. TIPS Whether you are increasing or decreasing the number of calories you eat during a day, it may be hard to get used to changes in what you eat and drink. The following are tips to help you keep track of the number of calories you eat.  Measure foods at home with measuring cups. This helps you know the amount of food and number of calories you are eating.   Restaurants often serve food in amounts that are larger than 1 serving. While eating out, estimate how many servings of a food you are given. For example, a serving of cooked rice is  cup or about the size of half of a fist. Knowing serving sizes will help you be aware of how much food you are eating at restaurants.   Ask for smaller portion sizes or child-size portions at restaurants.   Plan to eat half of a meal at a restaurant. Take the rest home or share the other half with a friend.   Read the Nutrition Facts panel on  food labels for calorie content and serving size. You can find out how many servings are in a package, the size of a serving, and the number of calories each serving has.   For example, a package might contain 3 cookies. The Nutrition Facts panel on that package says that 1 serving is 1 cookie. Below that, it will say there are 3 servings in the container. The calories section of the Nutrition Facts label says there are 90 calories. This means there are 90 calories in 1 cookie (1 serving). If you eat 1 cookie you have eaten 90 calories. If you eat all 3 cookies, you have eaten 270 calories (3 servings x 90 calories = 270 calories).  The list below tells you how big or small some common portion sizes are.  1 oz.........4 stacked dice.   3 oz.........Deck of cards.   1 tsp........Tip of little finger.   1 tbs........Thumb.   2 tbs........Golf ball.    cup.......Half of a fist.   1 cup........A fist.  KEEP A FOOD LOG Write down every food item you eat, the amount you eat, and the number of calories in each food you eat during the day. At the end of the day, you can add up the total number of calories you have eaten. It may help to keep a   list like the one below. Find out the calorie information by reading the Nutrition Facts panel on food labels. Breakfast  Bran cereal (1 cup, 110 calories).   Fat-free milk ( cup, 45 calories).  Snack  Apple (1 medium, 80 calories).  Lunch  Spinach (1 cup, 20 calories).   Tomato ( medium, 20 calories).   Chicken breast strips (3 oz, 165 calories).   Shredded cheddar cheese ( cup, 110 calories).   Light Italian dressing (2 tbs, 60 calories).   Whole-wheat bread (1 slice, 80 calories).   Tub margarine (1 tsp, 35 calories).   Vegetable soup (1 cup, 160 calories).  Dinner  Pork chop (3 oz, 190 calories).   Brown rice (1 cup, 215 calories).   Steamed broccoli ( cup, 20 calories).   Strawberries (1  cup, 65 calories).   Whipped  cream (1 tbs, 50 calories).  Daily Calorie Total: 1425 Document Released: 12/29/2004 Document Revised: 12/18/2010 Document Reviewed: 06/25/2006 Exit[employer] Patient Information 2012 [employer].  Exercise to Lose Weight Exercise and a healthy diet may help you lose weight. Your doctor may suggest specific exercises. EXERCISE IDEAS AND TIPS  Choose low-cost things you enjoy doing, such as walking, bicycling, or exercising to workout videos.   Take stairs instead of the elevator.   Walk during your lunch break.   Park your car further away from work or school.   Go to a gym or an exercise class.   Start with 5 to 10 minutes of exercise each day. Build up to 30 minutes of exercise 4 to 6 days a week.   Wear shoes with good support and comfortable clothes.   Stretch before and after working out.   Work out until you breathe harder and your heart beats faster.   Drink extra water when you exercise.   Do not do so much that you hurt yourself, feel dizzy, or get very short of breath.  Exercises that burn about 150 calories:  Running 1  miles in 15 minutes.   Playing volleyball for 45 to 60 minutes.   Washing and waxing a car for 45 to 60 minutes.   Playing touch football for 45 minutes.   Walking 1  miles in 35 minutes.   Pushing a stroller 1  miles in 30 minutes.   Playing basketball for 30 minutes.   Raking leaves for 30 minutes.   Bicycling 5 miles in 30 minutes.   Walking 2 miles in 30 minutes.   Dancing for 30 minutes.   Shoveling snow for 15 minutes.   Swimming laps for 20 minutes.   Walking up stairs for 15 minutes.   Bicycling 4 miles in 15 minutes.   Gardening for 30 to 45 minutes.   Jumping rope for 15 minutes.   Washing windows or floors for 45 to 60 minutes.  Document Released: 01/31/2010 Document Revised: 12/18/2010 Document Reviewed: 01/31/2010 Exit[employer] Patient Information 2012 [employer]. 

## 2011-04-15 NOTE — Assessment & Plan Note (Signed)
Check labs 

## 2011-04-15 NOTE — Assessment & Plan Note (Signed)
Has not used cpap in years Refer to pulm for sleep study

## 2011-04-15 NOTE — Assessment & Plan Note (Signed)
hctz 25 mg po qd prn Support hose Elevated legs Watch salt in diet

## 2011-04-15 NOTE — Assessment & Plan Note (Signed)
D/w pt exercise and diet 

## 2011-04-15 NOTE — Progress Notes (Signed)
  Subjective:    Patient ID: [name], female    DOB: 1950/02/01, 61 y.o.   MRN: [id number]  HPI Pt here c/o weight gain since November.  She has recently started working out again.    Pt is concerned about her sleep apnea ---she stopped using her cpap a long time ago.     Review of Systems As above    Objective:   Physical Exam  Constitutional: She is oriented to person, place, and time. She appears well-developed and well-nourished.  Neck: Normal range of motion. Neck supple. No thyromegaly present.  Cardiovascular: Normal rate, regular rhythm and normal heart sounds.   Pulmonary/Chest: Effort normal and breath sounds normal.  Lymphadenopathy:    She has no cervical adenopathy.  Neurological: She is alert and oriented to person, place, and time.  Skin: Skin is warm and dry.  Psychiatric: She has a normal mood and affect. Her behavior is normal. Judgment and thought content normal.  ext-- tr pitting edema b/l low ext        Assessment & Plan:

## 2011-04-17 ENCOUNTER — Telehealth: Payer: Self-pay | Admitting: Family Medicine

## 2011-04-17 NOTE — Telephone Encounter (Signed)
Thyroid normal Cholesterol--- LDL goal < 100, HDL >40, TG < 150. Diet and exercise will increase HDL and decrease LDL and TG. Fish, Fish Oil, Flaxseed oil will also help increase the HDL and decrease Triglycerides. Recheck labs in 3 months------- start zocor 20 mg #30 1 po qhs, 2 refills------272.4 lipid, hep.

## 2011-04-17 NOTE — Telephone Encounter (Signed)
msg left on VM.      KP 

## 2011-04-17 NOTE — Telephone Encounter (Signed)
Patient has already called "[facility]", she already has had them fill out order form & they are faxing to Dr. Laury Axon for signature.  Therefore, Selena Batten would you please speak with patient about Dr. Ernst Spell preference?  I tried to explain to her about the different sleep studies, but she didn't want the appointment with Pulmonary.

## 2011-04-17 NOTE — Telephone Encounter (Signed)
Patient has called, states she doesn't need referral to Pulmonary for sleep.  She states in the past she has went to [facility]" for a sleep study years ago, and wants to go back there.  Patient asking if this is ok with Dr. Laury Axon.  Please advise & I will fax referral to them.

## 2011-04-17 NOTE — Telephone Encounter (Signed)
I would prefer she have the sleep study with pulmonary----we discussed this at her ov--- they will make sue it is the appropriate sleep study and rx machine if needed.

## 2011-04-20 NOTE — Telephone Encounter (Signed)
Discussed with patient and she stated she can not take any statin drugs, she is allergic to them. She has also tried welchol. She is taking 2-4 fish oil daily and Niacin, she is also doing Zumba.

## 2011-04-21 ENCOUNTER — Institutional Professional Consult (permissible substitution): Payer: Medicare Other | Admitting: Pulmonary Disease

## 2011-04-21 NOTE — Telephone Encounter (Signed)
Now that she is back on an exercise plan we can repeat in 3 months--- if no better consider lipid clinic. 272.4 [facility], lipid, hep

## 2011-04-21 NOTE — Telephone Encounter (Signed)
I discussed with patient and she voiced understanding. We also discussed her sleep study request and I advised of Dr.Lowne's recommendations and she stated declined pulmonary and she stated her husband went there and they ordered his CPAP supplies. She asked could we fax it back, I advised I would and Dr.Lowne agreed since patient insisted.    KP

## 2011-04-25 ENCOUNTER — Other Ambulatory Visit: Payer: Self-pay | Admitting: Family Medicine

## 2011-04-28 NOTE — Telephone Encounter (Signed)
Last filled 02-16-11 #30 1, last OV 04-15-11

## 2011-05-22 ENCOUNTER — Encounter: Payer: Self-pay | Admitting: Family Medicine

## 2011-05-28 ENCOUNTER — Other Ambulatory Visit: Payer: Self-pay | Admitting: Family Medicine

## 2011-05-28 NOTE — Telephone Encounter (Signed)
Last OV 04-15-11

## 2011-06-24 ENCOUNTER — Other Ambulatory Visit: Payer: Self-pay | Admitting: Family Medicine

## 2011-06-24 MED ORDER — PREGABALIN 150 MG PO CAPS: 300.0000 mg | ORAL_CAPSULE | Freq: Every day | ORAL | Status: DC

## 2011-06-24 MED ORDER — ZOLPIDEM TARTRATE 10 MG PO TABS: 10.0000 mg | ORAL_TABLET | Freq: Every evening | ORAL | Status: DC | PRN

## 2011-06-24 NOTE — Addendum Note (Signed)
Addended by: Arnette Norris on: 06/24/2011 10:44 AM   Modules accepted: Orders

## 2011-06-24 NOTE — Telephone Encounter (Signed)
Last seen 04/15/11 and Lyrica last filled 10/24/10 # 6 with 5 refills Ambien last filled 01/27/11 # 30 with 3 refills. Please advise     KP

## 2011-06-24 NOTE — Telephone Encounter (Signed)
Refill x1 each only 

## 2011-06-25 ENCOUNTER — Telehealth: Payer: Self-pay | Admitting: Family Medicine

## 2011-06-25 MED ORDER — METHYLPHENIDATE HCL 10 MG PO TABS: 10.0000 mg | ORAL_TABLET | Freq: Three times a day (TID) | ORAL | Status: DC

## 2011-06-25 NOTE — Telephone Encounter (Signed)
Pt would like rx for Ritalin. Call mobile # when ready for pick up.

## 2011-06-25 NOTE — Telephone Encounter (Signed)
Patient aware Rx ready for pick up.      KP 

## 2011-06-26 ENCOUNTER — Telehealth: Payer: Self-pay | Admitting: Family Medicine

## 2011-06-26 NOTE — Telephone Encounter (Signed)
Patient wants to know if we have anymore discount cards for Abilify also wants to speak to you about her Lyrica as well. Please call at [phone]

## 2011-06-26 NOTE — Telephone Encounter (Signed)
tried calling patient but her VM is full.     Kp

## 2011-06-26 NOTE — Telephone Encounter (Signed)
Patient aware no coupons available. No further questions asked when she came in to pick up medications.     KP

## 2011-06-29 ENCOUNTER — Other Ambulatory Visit: Payer: Self-pay | Admitting: Internal Medicine

## 2011-06-30 ENCOUNTER — Other Ambulatory Visit: Payer: Self-pay | Admitting: Family Medicine

## 2011-06-30 MED ORDER — CLONAZEPAM 1 MG PO TABS: 1.0000 mg | ORAL_TABLET | Freq: Every evening | ORAL | Status: DC | PRN

## 2011-06-30 NOTE — Telephone Encounter (Signed)
refill klonopin 1mg  tab #30, take one tablet by mouth every day, last fill 5.19.13, last ov 4.3.13

## 2011-06-30 NOTE — Telephone Encounter (Signed)
Last seen 04/15/11 and filled 04/25/11 # 30 with 1 refill. Please advise     KP

## 2011-07-25 ENCOUNTER — Other Ambulatory Visit: Payer: Self-pay | Admitting: Family Medicine

## 2011-07-27 NOTE — Telephone Encounter (Signed)
Last seen 04/15/11 and filled 06/24/11 # 30. Please advise   KP

## 2011-08-04 ENCOUNTER — Telehealth: Payer: Self-pay | Admitting: Family Medicine

## 2011-08-04 MED ORDER — METHYLPHENIDATE HCL 10 MG PO TABS: 10.0000 mg | ORAL_TABLET | Freq: Three times a day (TID) | ORAL | Status: DC

## 2011-08-04 NOTE — Telephone Encounter (Signed)
Patient aware Rx ready for pick up.      KP 

## 2011-08-04 NOTE — Telephone Encounter (Signed)
Pt called requesting rx for Ritalin. Call [phone] when ready.

## 2011-08-13 ENCOUNTER — Other Ambulatory Visit: Payer: Self-pay

## 2011-08-13 MED ORDER — ARIPIPRAZOLE 2 MG PO TABS: 2.0000 mg | ORAL_TABLET | Freq: Every day | ORAL | Status: DC

## 2011-08-13 NOTE — Telephone Encounter (Signed)
Printed Rx to send to the patient assistance program.     KP

## 2011-08-24 ENCOUNTER — Other Ambulatory Visit: Payer: Self-pay | Admitting: Family Medicine

## 2011-08-24 ENCOUNTER — Other Ambulatory Visit: Payer: Self-pay

## 2011-08-24 MED ORDER — RISEDRONATE SODIUM 150 MG PO TABS: 150.0000 mg | ORAL_TABLET | ORAL | Status: DC

## 2011-08-24 MED ORDER — RISEDRONATE SODIUM 150 MG PO TABS: ORAL_TABLET | ORAL | Status: DC

## 2011-08-24 NOTE — Telephone Encounter (Signed)
ACTONEL 150 MG TABLET QTY:1  LAST REFILL: 08/24/11 TAKE ONE TABLET BY MOUTH ONCE A MONTH IN THE MORNING WITH A GLASS OR WATER, 30 MINUTES BEFORE A MEAL OR BEVERAGE, REMAIN UPRIGHT.

## 2011-08-28 ENCOUNTER — Telehealth: Payer: Self-pay | Admitting: Family Medicine

## 2011-08-28 ENCOUNTER — Other Ambulatory Visit: Payer: Self-pay | Admitting: Family Medicine

## 2011-08-28 DIAGNOSIS — Z1231 Encounter for screening mammogram for malignant neoplasm of breast: Secondary | ICD-10-CM

## 2011-08-28 NOTE — Telephone Encounter (Signed)
Patient would like [place] to know that she is upset about the way her Actonel rx was written. She states she had to pay $100 for 1 pill. She had been getting 3 pills at a time, and it was the same amount of money. She is requesting that when possible, she would always like her prescriptions for 90 day supply due to her insurance.

## 2011-08-30 ENCOUNTER — Encounter: Payer: Self-pay | Admitting: Family Medicine

## 2011-08-31 ENCOUNTER — Encounter: Payer: Self-pay | Admitting: Family Medicine

## 2011-08-31 ENCOUNTER — Other Ambulatory Visit: Payer: Self-pay | Admitting: Family Medicine

## 2011-08-31 DIAGNOSIS — Z780 Asymptomatic menopausal state: Secondary | ICD-10-CM

## 2011-08-31 MED ORDER — RISEDRONATE SODIUM 150 MG PO TABS: 150.0000 mg | ORAL_TABLET | ORAL | Status: DC

## 2011-08-31 NOTE — Telephone Encounter (Signed)
Noted--New Rx sent for a 90 day supply---- KP

## 2011-08-31 NOTE — Telephone Encounter (Signed)
msg left to call the office     KP 

## 2011-08-31 NOTE — Telephone Encounter (Signed)
Ok-- I thought we did it on purpose because her bmd was due in April-- I put an order in for bmd

## 2011-08-31 NOTE — Telephone Encounter (Signed)
Pt over due for bmd-- was that the reason it was not done?   Ok to send 1 package of 3

## 2011-08-31 NOTE — Telephone Encounter (Signed)
The request did not come in as a request for a 3 mo supply this is why it was not done that way.    KP

## 2011-09-02 ENCOUNTER — Encounter: Payer: Self-pay | Admitting: Family Medicine

## 2011-09-02 ENCOUNTER — Other Ambulatory Visit: Payer: Self-pay | Admitting: Family Medicine

## 2011-09-03 NOTE — Telephone Encounter (Signed)
Last seen 04/15/11 and filled 06/30/11 # 30 with 1. Please advise     KP

## 2011-09-08 ENCOUNTER — Ambulatory Visit
Admission: RE | Admit: 2011-09-08 | Discharge: 2011-09-08 | Disposition: A | Discharge status: Home / Self Care | Payer: Medicare Other | Source: Ambulatory Visit | Attending: Family Medicine | Admitting: Family Medicine

## 2011-09-08 DIAGNOSIS — Z780 Asymptomatic menopausal state: Secondary | ICD-10-CM

## 2011-09-22 ENCOUNTER — Encounter: Payer: Self-pay | Admitting: Family Medicine

## 2011-09-29 ENCOUNTER — Ambulatory Visit (INDEPENDENT_AMBULATORY_CARE_PROVIDER_SITE_OTHER): Payer: Medicare Other

## 2011-09-29 ENCOUNTER — Encounter: Payer: Medicare Other | Admitting: Family Medicine

## 2011-09-29 DIAGNOSIS — Z23 Encounter for immunization: Secondary | ICD-10-CM

## 2011-10-02 ENCOUNTER — Encounter: Payer: Self-pay | Admitting: Family Medicine

## 2011-10-02 MED ORDER — RISEDRONATE SODIUM 150 MG PO TABS: 150.0000 mg | ORAL_TABLET | ORAL | Status: DC

## 2011-10-04 ENCOUNTER — Encounter: Payer: Self-pay | Admitting: Family Medicine

## 2011-10-05 ENCOUNTER — Ambulatory Visit
Admission: RE | Admit: 2011-10-05 | Discharge: 2011-10-05 | Disposition: A | Discharge status: Home / Self Care | Payer: Medicare Other | Source: Ambulatory Visit | Attending: Family Medicine | Admitting: Family Medicine

## 2011-10-05 ENCOUNTER — Other Ambulatory Visit: Payer: Self-pay

## 2011-10-05 ENCOUNTER — Encounter: Payer: Self-pay | Admitting: Family Medicine

## 2011-10-05 DIAGNOSIS — Z1231 Encounter for screening mammogram for malignant neoplasm of breast: Secondary | ICD-10-CM

## 2011-10-05 MED ORDER — CLONAZEPAM 1 MG PO TABS: 1.0000 mg | ORAL_TABLET | Freq: Every evening | ORAL | Status: DC | PRN

## 2011-10-05 MED ORDER — MELOXICAM 15 MG PO TABS: 15.0000 mg | ORAL_TABLET | Freq: Every day | ORAL | Status: DC

## 2011-10-05 NOTE — Telephone Encounter (Signed)
Last seen 05/12/11 and filled Klonopin 09/02/11 and Mobic has not been filled in this system. Please advise     KP

## 2011-10-06 ENCOUNTER — Other Ambulatory Visit: Payer: Self-pay | Admitting: Family Medicine

## 2011-10-06 DIAGNOSIS — N63 Unspecified lump in unspecified breast: Secondary | ICD-10-CM

## 2011-10-06 NOTE — Telephone Encounter (Signed)
Rx faxed yesterday---- KP

## 2011-10-09 ENCOUNTER — Ambulatory Visit (INDEPENDENT_AMBULATORY_CARE_PROVIDER_SITE_OTHER): Payer: Medicare Other | Admitting: Family Medicine

## 2011-10-09 ENCOUNTER — Ambulatory Visit
Admission: RE | Admit: 2011-10-09 | Discharge: 2011-10-09 | Disposition: A | Discharge status: Home / Self Care | Payer: 59 | Source: Ambulatory Visit | Attending: Family Medicine | Admitting: Family Medicine

## 2011-10-09 ENCOUNTER — Encounter: Payer: Self-pay | Admitting: Family Medicine

## 2011-10-09 ENCOUNTER — Other Ambulatory Visit (HOSPITAL_COMMUNITY)
Admission: RE | Admit: 2011-10-09 | Discharge: 2011-10-09 | Disposition: A | Discharge status: Home / Self Care | Payer: 59 | Source: Ambulatory Visit | Attending: Family Medicine | Admitting: Family Medicine

## 2011-10-09 VITALS — BP 126/80 | HR 73 | Temp 98.6°F | Ht <= 58 in | Wt 140.0 lb

## 2011-10-09 DIAGNOSIS — N760 Acute vaginitis: Secondary | ICD-10-CM | POA: Insufficient documentation

## 2011-10-09 DIAGNOSIS — M1990 Unspecified osteoarthritis, unspecified site: Secondary | ICD-10-CM

## 2011-10-09 DIAGNOSIS — Z0000 Encounter for general adult medical examination without abnormal findings: Secondary | ICD-10-CM

## 2011-10-09 DIAGNOSIS — N898 Other specified noninflammatory disorders of vagina: Secondary | ICD-10-CM

## 2011-10-09 DIAGNOSIS — E785 Hyperlipidemia, unspecified: Secondary | ICD-10-CM

## 2011-10-09 DIAGNOSIS — M797 Fibromyalgia: Secondary | ICD-10-CM

## 2011-10-09 DIAGNOSIS — J45909 Unspecified asthma, uncomplicated: Secondary | ICD-10-CM

## 2011-10-09 DIAGNOSIS — N63 Unspecified lump in unspecified breast: Secondary | ICD-10-CM

## 2011-10-09 DIAGNOSIS — Z124 Encounter for screening for malignant neoplasm of cervix: Secondary | ICD-10-CM

## 2011-10-09 DIAGNOSIS — M818 Other osteoporosis without current pathological fracture: Secondary | ICD-10-CM

## 2011-10-09 DIAGNOSIS — Z202 Contact with and (suspected) exposure to infections with a predominantly sexual mode of transmission: Secondary | ICD-10-CM

## 2011-10-09 DIAGNOSIS — Z01419 Encounter for gynecological examination (general) (routine) without abnormal findings: Secondary | ICD-10-CM | POA: Insufficient documentation

## 2011-10-09 DIAGNOSIS — Z113 Encounter for screening for infections with a predominantly sexual mode of transmission: Secondary | ICD-10-CM | POA: Insufficient documentation

## 2011-10-09 DIAGNOSIS — R609 Edema, unspecified: Secondary | ICD-10-CM

## 2011-10-09 DIAGNOSIS — K219 Gastro-esophageal reflux disease without esophagitis: Secondary | ICD-10-CM

## 2011-10-09 DIAGNOSIS — M810 Age-related osteoporosis without current pathological fracture: Secondary | ICD-10-CM

## 2011-10-09 DIAGNOSIS — J302 Other seasonal allergic rhinitis: Secondary | ICD-10-CM

## 2011-10-09 DIAGNOSIS — Z7251 High risk heterosexual behavior: Secondary | ICD-10-CM

## 2011-10-09 DIAGNOSIS — F988 Other specified behavioral and emotional disorders with onset usually occurring in childhood and adolescence: Secondary | ICD-10-CM

## 2011-10-09 DIAGNOSIS — Z206 Contact with and (suspected) exposure to human immunodeficiency virus [HIV]: Secondary | ICD-10-CM

## 2011-10-09 LAB — POCT URINALYSIS DIPSTICK
Bilirubin, UA: NEGATIVE
Blood, UA: NEGATIVE
Glucose, UA: NEGATIVE
Leukocytes, UA: NEGATIVE
Nitrite, UA: NEGATIVE

## 2011-10-09 MED ORDER — OMEPRAZOLE 20 MG PO CPDR
20.0000 mg | DELAYED_RELEASE_CAPSULE | Freq: Every day | ORAL | Status: DC
Start: 2011-10-09 — End: 2012-10-17

## 2011-10-09 MED ORDER — ALBUTEROL SULFATE HFA 108 (90 BASE) MCG/ACT IN AERS: 2.0000 | INHALATION_SPRAY | Freq: Four times a day (QID) | RESPIRATORY_TRACT | Status: DC | PRN

## 2011-10-09 MED ORDER — FLUTICASONE PROPIONATE 50 MCG/ACT NA SUSP: 2.0000 | Freq: Every day | NASAL | Status: DC

## 2011-10-09 MED ORDER — METHYLPHENIDATE HCL 10 MG PO TABS: 10.0000 mg | ORAL_TABLET | Freq: Three times a day (TID) | ORAL | Status: DC

## 2011-10-09 MED ORDER — PREGABALIN 150 MG PO CAPS: 300.0000 mg | ORAL_CAPSULE | Freq: Every day | ORAL | Status: DC

## 2011-10-09 MED ORDER — TRAMADOL HCL 50 MG PO TABS: 50.0000 mg | ORAL_TABLET | Freq: Four times a day (QID) | ORAL | Status: DC | PRN

## 2011-10-09 MED ORDER — HYDROCHLOROTHIAZIDE 25 MG PO TABS: 25.0000 mg | ORAL_TABLET | Freq: Every day | ORAL | Status: DC

## 2011-10-09 MED ORDER — MELOXICAM 15 MG PO TABS: 15.0000 mg | ORAL_TABLET | Freq: Every day | ORAL | Status: DC

## 2011-10-09 MED ORDER — ALENDRONATE SODIUM 70 MG PO TABS: 70.0000 mg | ORAL_TABLET | ORAL | Status: DC

## 2011-10-09 NOTE — Patient Instructions (Addendum)
Preventive Care for Adults, Female A healthy lifestyle and preventive care can promote health and wellness. Preventive health guidelines for women include the following key practices.  A routine yearly physical is a good way to check with your caregiver about your health and preventive screening. It is a chance to share any concerns and updates on your health, and to receive a thorough exam.   Visit your dentist for a routine exam and preventive care every 6 months. Brush your teeth twice a day and floss once a day. Good oral hygiene prevents tooth decay and gum disease.   The frequency of eye exams is based on your age, health, family medical history, use of contact lenses, and other factors. Follow your caregiver's recommendations for frequency of eye exams.   Eat a healthy diet. Foods like vegetables, fruits, whole grains, low-fat dairy products, and lean protein foods contain the nutrients you need without too many calories. Decrease your intake of foods high in solid fats, added sugars, and salt. Eat the right amount of calories for you.Get information about a proper diet from your caregiver, if necessary.   Regular physical exercise is one of the most important things you can do for your health. Most adults should get at least 150 minutes of moderate-intensity exercise (any activity that increases your heart rate and causes you to sweat) each week. In addition, most adults need muscle-strengthening exercises on 2 or more days a week.   Maintain a healthy weight. The body mass index (BMI) is a screening tool to identify possible weight problems. It provides an estimate of body fat based on height and weight. Your caregiver can help determine your BMI, and can help you achieve or maintain a healthy weight.For adults 20 years and older:   A BMI below 18.5 is considered underweight.   A BMI of 18.5 to 24.9 is normal.   A BMI of 25 to 29.9 is considered overweight.   A BMI of 30 and above is  considered obese.   Maintain normal blood lipids and cholesterol levels by exercising and minimizing your intake of saturated fat. Eat a balanced diet with plenty of fruit and vegetables. Blood tests for lipids and cholesterol should begin at age 20 and be repeated every 5 years. If your lipid or cholesterol levels are high, you are over 50, or you are at high risk for heart disease, you may need your cholesterol levels checked more frequently.Ongoing high lipid and cholesterol levels should be treated with medicines if diet and exercise are not effective.   If you smoke, find out from your caregiver how to quit. If you do not use tobacco, do not start.   If you are pregnant, do not drink alcohol. If you are breastfeeding, be very cautious about drinking alcohol. If you are not pregnant and choose to drink alcohol, do not exceed 1 drink per day. One drink is considered to be 12 ounces (355 mL) of beer, 5 ounces (148 mL) of wine, or 1.5 ounces (44 mL) of liquor.   Avoid use of street drugs. Do not share needles with anyone. Ask for help if you need support or instructions about stopping the use of drugs.   High blood pressure causes heart disease and increases the risk of stroke. Your blood pressure should be checked at least every 1 to 2 years. Ongoing high blood pressure should be treated with medicines if weight loss and exercise are not effective.   If you are 55 to 61   years old, ask your caregiver if you should take aspirin to prevent strokes.   Diabetes screening involves taking a blood sample to check your fasting blood sugar level. This should be done once every 3 years, after age 45, if you are within normal weight and without risk factors for diabetes. Testing should be considered at a younger age or be carried out more frequently if you are overweight and have at least 1 risk factor for diabetes.   Breast cancer screening is essential preventive care for women. You should practice "breast  self-awareness." This means understanding the normal appearance and feel of your breasts and may include breast self-examination. Any changes detected, no matter how small, should be reported to a caregiver. Women in their 20s and 30s should have a clinical breast exam (CBE) by a caregiver as part of a regular health exam every 1 to 3 years. After age 40, women should have a CBE every year. Starting at age 40, women should consider having a mammography (breast X-ray test) every year. Women who have a family history of breast cancer should talk to their caregiver about genetic screening. Women at a high risk of breast cancer should talk to their caregivers about having magnetic resonance imaging (MRI) and a mammography every year.   The Pap test is a screening test for cervical cancer. A Pap test can show cell changes on the cervix that might become cervical cancer if left untreated. A Pap test is a procedure in which cells are obtained and examined from the lower end of the uterus (cervix).   Women should have a Pap test starting at age 21.   Between ages 21 and 29, Pap tests should be repeated every 2 years.   Beginning at age 30, you should have a Pap test every 3 years as long as the past 3 Pap tests have been normal.   Some women have medical problems that increase the chance of getting cervical cancer. Talk to your caregiver about these problems. It is especially important to talk to your caregiver if a new problem develops soon after your last Pap test. In these cases, your caregiver may recommend more frequent screening and Pap tests.   The above recommendations are the same for women who have or have not gotten the vaccine for human papillomavirus (HPV).   If you had a hysterectomy for a problem that was not cancer or a condition that could lead to cancer, then you no longer need Pap tests. Even if you no longer need a Pap test, a regular exam is a good idea to make sure no other problems are  starting.   If you are between ages 65 and 70, and you have had normal Pap tests going back 10 years, you no longer need Pap tests. Even if you no longer need a Pap test, a regular exam is a good idea to make sure no other problems are starting.   If you have had past treatment for cervical cancer or a condition that could lead to cancer, you need Pap tests and screening for cancer for at least 20 years after your treatment.   If Pap tests have been discontinued, risk factors (such as a new sexual partner) need to be reassessed to determine if screening should be resumed.   The HPV test is an additional test that may be used for cervical cancer screening. The HPV test looks for the virus that can cause the cell changes on the cervix.   The cells collected during the Pap test can be tested for HPV. The HPV test could be used to screen women aged 30 years and older, and should be used in women of any age who have unclear Pap test results. After the age of 30, women should have HPV testing at the same frequency as a Pap test.   Colorectal cancer can be detected and often prevented. Most routine colorectal cancer screening begins at the age of 50 and continues through age 75. However, your caregiver may recommend screening at an earlier age if you have risk factors for colon cancer. On a yearly basis, your caregiver may provide home test kits to check for hidden blood in the stool. Use of a small camera at the end of a tube, to directly examine the colon (sigmoidoscopy or colonoscopy), can detect the earliest forms of colorectal cancer. Talk to your caregiver about this at age 50, when routine screening begins. Direct examination of the colon should be repeated every 5 to 10 years through age 75, unless early forms of pre-cancerous polyps or small growths are found.   Hepatitis C blood testing is recommended for all people born from 1945 through 1965 and any individual with known risks for hepatitis C.    Practice safe sex. Use condoms and avoid high-risk sexual practices to reduce the spread of sexually transmitted infections (STIs). STIs include gonorrhea, chlamydia, syphilis, trichomonas, herpes, HPV, and human immunodeficiency virus (HIV). Herpes, HIV, and HPV are viral illnesses that have no cure. They can result in disability, cancer, and death. Sexually active women aged 25 and younger should be checked for chlamydia. Older women with new or multiple partners should also be tested for chlamydia. Testing for other STIs is recommended if you are sexually active and at increased risk.   Osteoporosis is a disease in which the bones lose minerals and strength with aging. This can result in serious bone fractures. The risk of osteoporosis can be identified using a bone density scan. Women ages 65 and over and women at risk for fractures or osteoporosis should discuss screening with their caregivers. Ask your caregiver whether you should take a calcium supplement or vitamin D to reduce the rate of osteoporosis.   Menopause can be associated with physical symptoms and risks. Hormone replacement therapy is available to decrease symptoms and risks. You should talk to your caregiver about whether hormone replacement therapy is right for you.   Use sunscreen with sun protection factor (SPF) of 30 or more. Apply sunscreen liberally and repeatedly throughout the day. You should seek shade when your shadow is shorter than you. Protect yourself by wearing long sleeves, pants, a wide-brimmed hat, and sunglasses year round, whenever you are outdoors.   Once a month, do a whole body skin exam, using a mirror to look at the skin on your back. Notify your caregiver of new moles, moles that have irregular borders, moles that are larger than a pencil eraser, or moles that have changed in shape or color.   Stay current with required immunizations.   Influenza. You need a dose every fall (or winter). The composition of  the flu vaccine changes each year, so being vaccinated once is not enough.   Pneumococcal polysaccharide. You need 1 to 2 doses if you smoke cigarettes or if you have certain chronic medical conditions. You need 1 dose at age 65 (or older) if you have never been vaccinated.   Tetanus, diphtheria, pertussis (Tdap, Td). Get 1 dose of   Tdap vaccine if you are younger than age 65, are over 65 and have contact with an infant, are a healthcare worker, are pregnant, or simply want to be protected from whooping cough. After that, you need a Td booster dose every 10 years. Consult your caregiver if you have not had at least 3 tetanus and diphtheria-containing shots sometime in your life or have a deep or dirty wound.   HPV. You need this vaccine if you are a woman age 26 or younger. The vaccine is given in 3 doses over 6 months.   Measles, mumps, rubella (MMR). You need at least 1 dose of MMR if you were born in 1957 or later. You may also need a second dose.   Meningococcal. If you are age 19 to 21 and a first-year college student living in a residence hall, or have one of several medical conditions, you need to get vaccinated against meningococcal disease. You may also need additional booster doses.   Zoster (shingles). If you are age 60 or older, you should get this vaccine.   Varicella (chickenpox). If you have never had chickenpox or you were vaccinated but received only 1 dose, talk to your caregiver to find out if you need this vaccine.   Hepatitis A. You need this vaccine if you have a specific risk factor for hepatitis A virus infection or you simply wish to be protected from this disease. The vaccine is usually given as 2 doses, 6 to 18 months apart.   Hepatitis B. You need this vaccine if you have a specific risk factor for hepatitis B virus infection or you simply wish to be protected from this disease. The vaccine is given in 3 doses, usually over 6 months.  Preventive Services /  Frequency Ages 19 to 39  Blood pressure check.** / Every 1 to 2 years.   Lipid and cholesterol check.** / Every 5 years beginning at age 20.   Clinical breast exam.** / Every 3 years for women in their 20s and 30s.   Pap test.** / Every 2 years from ages 21 through 29. Every 3 years starting at age 30 through age 65 or 70 with a history of 3 consecutive normal Pap tests.   HPV screening.** / Every 3 years from ages 30 through ages 65 to 70 with a history of 3 consecutive normal Pap tests.   Hepatitis C blood test.** / For any individual with known risks for hepatitis C.   Skin self-exam. / Monthly.   Influenza immunization.** / Every year.   Pneumococcal polysaccharide immunization.** / 1 to 2 doses if you smoke cigarettes or if you have certain chronic medical conditions.   Tetanus, diphtheria, pertussis (Tdap, Td) immunization. / A one-time dose of Tdap vaccine. After that, you need a Td booster dose every 10 years.   HPV immunization. / 3 doses over 6 months, if you are 26 and younger.   Measles, mumps, rubella (MMR) immunization. / You need at least 1 dose of MMR if you were born in 1957 or later. You may also need a second dose.   Meningococcal immunization. / 1 dose if you are age 19 to 21 and a first-year college student living in a residence hall, or have one of several medical conditions, you need to get vaccinated against meningococcal disease. You may also need additional booster doses.   Varicella immunization.** / Consult your caregiver.   Hepatitis A immunization.** / Consult your caregiver. 2 doses, 6 to 18 months   apart.   Hepatitis B immunization.** / Consult your caregiver. 3 doses usually over 6 months.  Ages 40 to 64  Blood pressure check.** / Every 1 to 2 years.   Lipid and cholesterol check.** / Every 5 years beginning at age 20.   Clinical breast exam.** / Every year after age 40.   Mammogram.** / Every year beginning at age 40 and continuing for as  long as you are in good health. Consult with your caregiver.   Pap test.** / Every 3 years starting at age 30 through age 65 or 70 with a history of 3 consecutive normal Pap tests.   HPV screening.** / Every 3 years from ages 30 through ages 65 to 70 with a history of 3 consecutive normal Pap tests.   Fecal occult blood test (FOBT) of stool. / Every year beginning at age 50 and continuing until age 75. You may not need to do this test if you get a colonoscopy every 10 years.   Flexible sigmoidoscopy or colonoscopy.** / Every 5 years for a flexible sigmoidoscopy or every 10 years for a colonoscopy beginning at age 50 and continuing until age 75.   Hepatitis C blood test.** / For all people born from 1945 through 1965 and any individual with known risks for hepatitis C.   Skin self-exam. / Monthly.   Influenza immunization.** / Every year.   Pneumococcal polysaccharide immunization.** / 1 to 2 doses if you smoke cigarettes or if you have certain chronic medical conditions.   Tetanus, diphtheria, pertussis (Tdap, Td) immunization.** / A one-time dose of Tdap vaccine. After that, you need a Td booster dose every 10 years.   Measles, mumps, rubella (MMR) immunization. / You need at least 1 dose of MMR if you were born in 1957 or later. You may also need a second dose.   Varicella immunization.** / Consult your caregiver.   Meningococcal immunization.** / Consult your caregiver.   Hepatitis A immunization.** / Consult your caregiver. 2 doses, 6 to 18 months apart.   Hepatitis B immunization.** / Consult your caregiver. 3 doses, usually over 6 months.  Ages 65 and over  Blood pressure check.** / Every 1 to 2 years.   Lipid and cholesterol check.** / Every 5 years beginning at age 20.   Clinical breast exam.** / Every year after age 40.   Mammogram.** / Every year beginning at age 40 and continuing for as long as you are in good health. Consult with your caregiver.   Pap test.** /  Every 3 years starting at age 30 through age 65 or 70 with a 3 consecutive normal Pap tests. Testing can be stopped between 65 and 70 with 3 consecutive normal Pap tests and no abnormal Pap or HPV tests in the past 10 years.   HPV screening.** / Every 3 years from ages 30 through ages 65 or 70 with a history of 3 consecutive normal Pap tests. Testing can be stopped between 65 and 70 with 3 consecutive normal Pap tests and no abnormal Pap or HPV tests in the past 10 years.   Fecal occult blood test (FOBT) of stool. / Every year beginning at age 50 and continuing until age 75. You may not need to do this test if you get a colonoscopy every 10 years.   Flexible sigmoidoscopy or colonoscopy.** / Every 5 years for a flexible sigmoidoscopy or every 10 years for a colonoscopy beginning at age 50 and continuing until age 75.   Hepatitis   C blood test.** / For all people born from 1945 through 1965 and any individual with known risks for hepatitis C.   Osteoporosis screening.** / A one-time screening for women ages 65 and over and women at risk for fractures or osteoporosis.   Skin self-exam. / Monthly.   Influenza immunization.** / Every year.   Pneumococcal polysaccharide immunization.** / 1 dose at age 65 (or older) if you have never been vaccinated.   Tetanus, diphtheria, pertussis (Tdap, Td) immunization. / A one-time dose of Tdap vaccine if you are over 65 and have contact with an infant, are a healthcare worker, or simply want to be protected from whooping cough. After that, you need a Td booster dose every 10 years.   Varicella immunization.** / Consult your caregiver.   Meningococcal immunization.** / Consult your caregiver.   Hepatitis A immunization.** / Consult your caregiver. 2 doses, 6 to 18 months apart.   Hepatitis B immunization.** / Check with your caregiver. 3 doses, usually over 6 months.  ** Family history and personal history of risk and conditions may change your caregiver's  recommendations. Document Released: 02/24/2001 Document Revised: 12/18/2010 Document Reviewed: 05/26/2010 [employer] Patient Information 2012 [employer]. 

## 2011-10-09 NOTE — Progress Notes (Signed)
Subjective:     [name] is a 61 y.o. female and is here for a comprehensive physical exam. The patient reports problems - breast pain.  History   Social History  . Marital Status: Married    Spouse Name: N/A    Number of Children: N/A  . Years of Education: N/A   Occupational History  . school nurse    Social History Main Topics  . Smoking status: Never Smoker   . Smokeless tobacco: Never Used  . Alcohol Use: Yes  . Drug Use: No  . Sexually Active: Yes -- Female partner(s)   Other Topics Concern  . Not on file   Social History Narrative  . No narrative on file   Health Maintenance  Topic Date Due  . Zostavax  01/13/2010  . Influenza Vaccine  09/12/2012  . Mammogram  10/02/2012  . Pap Smear  10/09/2014  . Colonoscopy  01/22/2019  . Tetanus/tdap  09/21/2020    The following portions of the patient's history were reviewed and updated as appropriate:  She  has a past medical history of ADD (attention deficit disorder); Asthma; Depression; GERD (gastroesophageal reflux disease); Hyperlipemia; IBS (irritable bowel syndrome); and Fibromyalgia. She  does not have any pertinent problems on file. She  has past surgical history that includes Cholecystectomy; Abdominal hysterectomy; Cesarean section; Breast surgery; Tonsillectomy; Total knee arthroplasty; Toe amputation; Ventral hernia repair; and Carpal tunnel release. Her family history includes Arthritis in her mother; Cancer in her maternal grandfather and maternal uncle; Heart disease in her mother; Hypertension in her maternal grandmother and paternal grandmother; Lung cancer in her paternal grandfather; and Prostate cancer in her paternal grandfather. She  reports that she has never smoked. She has never used smokeless tobacco. She reports that she drinks alcohol. She reports that she does not use illicit drugs. She has a current medication list which includes the following prescription(s): albuterol, aripiprazole,  clonazepam, cyclobenzaprine, fish oil-omega-3 fatty acids, fluticasone, hydrochlorothiazide, hyoscyamine, lorazepam, meloxicam, methylphenidate, niacin, omeprazole, pregabalin, tramadol, zolpidem, and alendronate. Current Outpatient Prescriptions on File Prior to Visit  Medication Sig Dispense Refill  . ARIPiprazole (ABILIFY) 2 MG tablet Take 1 tablet (2 mg total) by mouth daily.  90 tablet  3  . clonazePAM (KLONOPIN) 1 MG tablet Take 1 tablet (1 mg total) by mouth at bedtime as needed for anxiety.  30 tablet  0  . cyclobenzaprine (FLEXERIL) 10 MG tablet Take 1 tablet (10 mg total) by mouth 2 (two) times daily as needed for muscle spasms.  30 tablet  0  . fish oil-omega-3 fatty acids 1000 MG capsule Take 2 g by mouth daily.      . hyoscyamine (LEVSIN SL) 0.125 MG SL tablet Place 0.125 mg under the tongue every 4 (four) hours as needed.        Marland Kitchen LORazepam (ATIVAN) 1 MG tablet Take 1 mg by mouth 2 (two) times daily as needed.        . niacin (GNP NIACIN) 250 MG tablet Take 250 mg by mouth daily with breakfast.      . zolpidem (AMBIEN) 10 MG tablet Take 1 tablet (10 mg total) by mouth at bedtime as needed.  30 tablet  0  . DISCONTD: fluticasone (FLONASE) 50 MCG/ACT nasal spray USE TWO SPRAY IN EACH NOSTRIL EVERY DAY  16 g  4  . DISCONTD: hydrochlorothiazide (HYDRODIURIL) 25 MG tablet Take 1 tablet (25 mg total) by mouth daily.  30 tablet  11  . DISCONTD: methylphenidate (  RITALIN) 10 MG tablet Take 1 tablet (10 mg total) by mouth 3 (three) times daily.  90 tablet  0  . DISCONTD: omeprazole (PRILOSEC) 20 MG capsule Take 20 mg by mouth daily.        Marland Kitchen DISCONTD: pregabalin (LYRICA) 150 MG capsule Take 2 capsules (300 mg total) by mouth daily.  60 capsule  0  . DISCONTD: PROAIR HFA 108 (90 BASE) MCG/ACT inhaler INHALE TWO PUFFS 4 TIMES DAILY AS NEEDED  9 g  1  . DISCONTD: zolpidem (AMBIEN) 10 MG tablet Take 1 tablet (10 mg total) by mouth at bedtime as needed for sleep.  30 tablet  3  . DISCONTD: zolpidem  (AMBIEN) 10 MG tablet TAKE ONE TABLET BY MOUTH AT BEDTIME AS NEEDED  30 tablet  0   She is allergic to cefuroxime axetil; erythromycin; metaxalone; simvastatin; sulfonamide derivatives; and tetracycline..  Review of Systems Review of Systems  Constitutional: Negative for activity change, appetite change and fatigue.  HENT: Negative for hearing loss, congestion, tinnitus and ear discharge.  dentist q61m Eyes: Negative for visual disturbance (see optho q1y -- vision corrected to 20/20 with glasses).  Respiratory: Negative for cough, chest tightness and shortness of breath.   Cardiovascular: Negative for chest pain, palpitations and leg swelling.  Gastrointestinal: Negative for abdominal pain, diarrhea, constipation and abdominal distention.  Genitourinary: Negative for urgency, frequency, decreased urine volume and difficulty urinating.  Musculoskeletal: Negative for back pain, arthralgias and gait problem.  Skin: Negative for color change, pallor and rash.  Neurological: Negative for dizziness, light-headedness, numbness and headaches.  Hematological: Negative for adenopathy. Does not bruise/bleed easily.  Psychiatric/Behavioral: Negative for suicidal ideas, confusion, sleep disturbance, self-injury, dysphoric mood, decreased concentration and agitation.       Objective:  BP 126/80  Pulse 73  Temp 98.6 F (37 C) (Oral)  Ht 4' 7.75" (1.416 m)  Wt 140 lb (63.504 kg)  BMI 31.67 kg/m2  SpO2 98%  General Appearance:    Alert, cooperative, no distress, appears stated age  Head:    Normocephalic, without obvious abnormality, atraumatic  Eyes:    PERRL, conjunctiva/corneas clear, EOM's intact, fundi    benign, both eyes  Ears:    Normal TM's and external ear canals, both ears  Nose:   Nares normal, septum midline, mucosa normal, no drainage    or sinus tenderness  Throat:   Lips, mucosa, and tongue normal; teeth and gums normal  Neck:   Supple, symmetrical, trachea midline, no  adenopathy;    thyroid:  no enlargement/tenderness/nodules; no carotid   bruit or JVD  Back:     Symmetric, no curvature, ROM normal, no CVA tenderness  Lungs:     Clear to auscultation bilaterally, respirations unlabored  Chest Wall:    No tenderness or deformity   Heart:    Regular rate and rhythm, S1 and S2 normal, no murmur, rub   or gallop  Breast Exam:    No tenderness, masses, or nipple abnormality  Abdomen:     Soft, non-tender, bowel sounds active all four quadrants,    no masses, no organomegaly  Genitalia:    Normal female without lesion, discharge or tenderness, pap done  Rectal:    Normal tone,, no masses or tenderness;   guaiac negative stool  Extremities:   Extremities normal, atraumatic, no cyanosis or edema  Pulses:   2+ and symmetric all extremities  Skin:   Skin color, texture, turgor normal, no rashes or lesions  Lymph nodes:  Cervical, supraclavicular, and axillary nodes normal  Neurologic:   CNII-XII intact, normal strength, sensation and reflexes    throughout    Assessment:    Healthy female exam.     Plan:    ghm utd Check labs See After Visit Summary for Counseling Recommendations

## 2011-10-11 NOTE — Assessment & Plan Note (Signed)
con't meds  Check labs 

## 2011-10-11 NOTE — Assessment & Plan Note (Signed)
con't meds stable 

## 2011-10-11 NOTE — Assessment & Plan Note (Signed)
Change to fosamax

## 2011-10-11 NOTE — Assessment & Plan Note (Signed)
con't meds 

## 2011-10-13 ENCOUNTER — Encounter: Payer: Self-pay | Admitting: Family Medicine

## 2011-10-13 ENCOUNTER — Other Ambulatory Visit: Payer: Self-pay | Admitting: Family Medicine

## 2011-10-13 DIAGNOSIS — R350 Frequency of micturition: Secondary | ICD-10-CM

## 2011-10-13 DIAGNOSIS — N898 Other specified noninflammatory disorders of vagina: Secondary | ICD-10-CM

## 2011-10-13 MED ORDER — ESTROGENS, CONJUGATED 0.625 MG/GM VA CREA: TOPICAL_CREAM | VAGINAL | Status: DC

## 2011-10-13 NOTE — Telephone Encounter (Signed)
Patient would like a hormone cream or pill sent to [facility] and Fosamax. Please advise     KP

## 2011-10-14 ENCOUNTER — Other Ambulatory Visit (HOSPITAL_COMMUNITY): Payer: Self-pay | Admitting: Orthopedic Surgery

## 2011-10-14 DIAGNOSIS — M25561 Pain in right knee: Secondary | ICD-10-CM

## 2011-10-16 ENCOUNTER — Telehealth: Payer: Self-pay | Admitting: Family Medicine

## 2011-10-16 NOTE — Telephone Encounter (Signed)
Discussed with patient and made her aware I will call [facility] on Monday to get labs.      KP

## 2011-10-16 NOTE — Telephone Encounter (Signed)
Patient calling in reference to the Urology referral for frequency.  She was scheduled to see Dr. Annabell Howells on 12/04/11, and when I informed her, patient states "Urology???  I already have a urologist, she should have referred me to Gynecology."  Patient said she will schedule her own appointment with Dr. Logan Bores at [facility], mainly for Rectocele.  I will cancel Urology appointment & referral, I informed patient I would fax her last office note to Dr. Logan Bores.    Patient also asking why her lab results are not back yet.  Please call with results when available.

## 2011-10-19 ENCOUNTER — Encounter: Payer: Self-pay | Admitting: Family Medicine

## 2011-10-19 ENCOUNTER — Other Ambulatory Visit: Payer: Self-pay | Admitting: Family Medicine

## 2011-10-19 DIAGNOSIS — M1990 Unspecified osteoarthritis, unspecified site: Secondary | ICD-10-CM

## 2011-10-19 MED ORDER — TRAMADOL HCL 50 MG PO TABS: 50.0000 mg | ORAL_TABLET | Freq: Four times a day (QID) | ORAL | Status: DC | PRN

## 2011-10-20 ENCOUNTER — Telehealth: Payer: Self-pay

## 2011-10-20 ENCOUNTER — Encounter: Payer: Self-pay | Admitting: Family Medicine

## 2011-10-20 NOTE — Telephone Encounter (Signed)
msg left to call the office to get pharmacy---   KP

## 2011-10-20 NOTE — Telephone Encounter (Signed)
Lab results from [facility]--- Cholesterol elevated per Dr.Lowne patient had side effects to the Zocor so she wanted the patient to start Lipitor 10 mg every other day #15 with 2 refills. Co-Q10 200 mg daily and recheck labs in 3 mos 272.4 hep, lipid.  Spoke with patient to make her aware and she stated she can not take any statins, she is willing to try something different. Please advise      KP

## 2011-10-20 NOTE — Telephone Encounter (Signed)
zetia 10 mg  #30  1 po qd , 2 refills

## 2011-10-21 MED ORDER — EZETIMIBE 10 MG PO TABS: 10.0000 mg | ORAL_TABLET | Freq: Every day | ORAL | Status: DC

## 2011-10-21 NOTE — Telephone Encounter (Signed)
Discussed with patient and she voiced understanding, Rx faxed.     KP 

## 2011-10-22 ENCOUNTER — Telehealth: Payer: Self-pay | Admitting: Family Medicine

## 2011-10-22 NOTE — Telephone Encounter (Signed)
Opened in error

## 2011-10-25 ENCOUNTER — Encounter (INDEPENDENT_AMBULATORY_CARE_PROVIDER_SITE_OTHER): Payer: 59 | Admitting: Family Medicine

## 2011-10-25 DIAGNOSIS — M1990 Unspecified osteoarthritis, unspecified site: Secondary | ICD-10-CM

## 2011-10-26 ENCOUNTER — Encounter: Payer: Self-pay | Admitting: Family Medicine

## 2011-10-26 ENCOUNTER — Telehealth: Payer: Self-pay | Admitting: Family Medicine

## 2011-10-26 MED ORDER — TRAMADOL HCL 50 MG PO TABS: 50.0000 mg | ORAL_TABLET | Freq: Four times a day (QID) | ORAL | Status: DC | PRN

## 2011-10-26 MED ORDER — EPINEPHRINE 0.3 MG/0.3ML IJ DEVI: 0.3000 mg | Freq: Once | INTRAMUSCULAR | Status: DC

## 2011-10-26 MED ORDER — CYCLOBENZAPRINE HCL 10 MG PO TABS: 10.0000 mg | ORAL_TABLET | Freq: Two times a day (BID) | ORAL | Status: DC | PRN

## 2011-10-26 MED ORDER — CLONAZEPAM 1 MG PO TABS: 1.0000 mg | ORAL_TABLET | Freq: Every evening | ORAL | Status: DC | PRN

## 2011-10-26 NOTE — Telephone Encounter (Signed)
Tramadol resent, listed as no print.  Clonazepam faxed.

## 2011-10-26 NOTE — Telephone Encounter (Signed)
Pt needs 90 day supply of these meds sent to [facility].  Pt also needs EpiPen. Please advise quantity and refills. Thanks.

## 2011-10-26 NOTE — Addendum Note (Signed)
Addended by: Luisa Dago on: 10/26/2011 05:41 PM   Modules accepted: Orders

## 2011-10-26 NOTE — Telephone Encounter (Signed)
Refill: Tramadol hcl tab 50 mg. Take 1 tablet by mouth every 6 hours as needed. Qty 90. Last fill 9.27.13

## 2011-10-26 NOTE — Telephone Encounter (Signed)
rx was sent in to [facility] on 10.7.13. New refill request sent by [facility]. Left msg on pt's vmail to return call & verify pharmacy.

## 2011-10-26 NOTE — Telephone Encounter (Signed)
Klonopin #90   Flexeril #60 Tramadol refill x1 Epi pen x1

## 2011-10-27 ENCOUNTER — Encounter: Payer: Self-pay | Admitting: Family Medicine

## 2011-10-27 ENCOUNTER — Encounter (HOSPITAL_COMMUNITY): Payer: 59

## 2011-10-27 ENCOUNTER — Telehealth: Payer: Self-pay

## 2011-10-27 NOTE — Telephone Encounter (Signed)
Left message on voicemail for patient to return call to discuss problems that she indicated she is having with getting her medication refills (see MyChart Message)

## 2011-10-28 ENCOUNTER — Encounter (HOSPITAL_COMMUNITY): Payer: 59

## 2011-10-29 ENCOUNTER — Encounter (HOSPITAL_COMMUNITY): Payer: Self-pay

## 2011-10-29 ENCOUNTER — Encounter (HOSPITAL_COMMUNITY)
Admission: RE | Admit: 2011-10-29 | Discharge: 2011-10-29 | Disposition: A | Discharge status: Home / Self Care | Payer: 59 | Source: Ambulatory Visit | Attending: Orthopedic Surgery | Admitting: Orthopedic Surgery

## 2011-10-29 ENCOUNTER — Ambulatory Visit (HOSPITAL_COMMUNITY)
Admission: RE | Admit: 2011-10-29 | Discharge: 2011-10-29 | Disposition: A | Discharge status: Home / Self Care | Payer: 59 | Source: Ambulatory Visit | Attending: Orthopedic Surgery | Admitting: Orthopedic Surgery

## 2011-10-29 DIAGNOSIS — Z96659 Presence of unspecified artificial knee joint: Secondary | ICD-10-CM | POA: Insufficient documentation

## 2011-10-29 DIAGNOSIS — M25569 Pain in unspecified knee: Secondary | ICD-10-CM | POA: Insufficient documentation

## 2011-10-29 DIAGNOSIS — M25561 Pain in right knee: Secondary | ICD-10-CM

## 2011-10-29 MED ORDER — TECHNETIUM TC 99M MEDRONATE IV KIT
25.0000 | PACK | Freq: Once | INTRAVENOUS | Status: AC | PRN
  Administered 2011-10-29: 24 via INTRAVENOUS

## 2011-10-29 MED ORDER — CLONAZEPAM 1 MG PO TABS: 1.0000 mg | ORAL_TABLET | Freq: Every evening | ORAL | Status: DC | PRN

## 2011-10-29 NOTE — Telephone Encounter (Signed)
Sorry,I didn't get the message to call u. I was out of town for 5 days..now 6 counting today. I got called into work.I also work tomorrow I think. I talked to [facility] and they said I needed to get script directly from u. for clinopan. If it was in the envelope last week my son got I haven't opened it because I wasn't out of Ritalin.. I will call u tomorrow.    The above note was taken from the My-chart message sent 10/14.13. I spoke with patient and she had not received her medication, I went ahead and re-printed the Rx for klonopin and sent it to [facility] per patient request, she did not need the medication right away because she stated she had a few left.       KP

## 2011-10-29 NOTE — Telephone Encounter (Signed)
Rx sent 10/14 by stephanie---- KP

## 2011-10-30 ENCOUNTER — Telehealth: Payer: Self-pay | Admitting: Family Medicine

## 2011-10-30 DIAGNOSIS — F988 Other specified behavioral and emotional disorders with onset usually occurring in childhood and adolescence: Secondary | ICD-10-CM

## 2011-10-30 MED ORDER — METHYLPHENIDATE HCL 10 MG PO TABS: 10.0000 mg | ORAL_TABLET | Freq: Three times a day (TID) | ORAL | Status: DC

## 2011-10-30 NOTE — Telephone Encounter (Signed)
Patient lost the envelope that contained her Ritalin and her hanicaped placard form. Please advise      KP

## 2011-10-30 NOTE — Telephone Encounter (Signed)
Ok for #30 

## 2011-10-30 NOTE — Telephone Encounter (Signed)
Patient states she lost the envelope with her ritalin prescription and handicap papers. She is requesting another rx today.

## 2011-10-30 NOTE — Telephone Encounter (Signed)
Placed RX up front for pick up, advised pt available to pick up and MD Tabori filled #30 no refills in absence of MD Lowne, pt can call back when she gets back into the office for further pills, left vm on home phone to advise

## 2011-11-03 ENCOUNTER — Encounter: Payer: Self-pay | Admitting: Family Medicine

## 2011-11-03 MED ORDER — ZOLPIDEM TARTRATE 10 MG PO TABS: 10.0000 mg | ORAL_TABLET | Freq: Every evening | ORAL | Status: DC | PRN

## 2011-11-03 NOTE — Telephone Encounter (Signed)
#  30 OK ; same prn instructions

## 2011-11-03 NOTE — Telephone Encounter (Signed)
Refill request for Ambien last filled 07/25/11  # 30 and the patient was last seen 10/09/11. Please advise     KP

## 2011-11-04 ENCOUNTER — Telehealth: Payer: Self-pay

## 2011-11-04 ENCOUNTER — Encounter: Payer: Self-pay | Admitting: Family Medicine

## 2011-11-04 DIAGNOSIS — M810 Age-related osteoporosis without current pathological fracture: Secondary | ICD-10-CM

## 2011-11-04 MED ORDER — CYCLOBENZAPRINE HCL 10 MG PO TABS: 10.0000 mg | ORAL_TABLET | Freq: Two times a day (BID) | ORAL | Status: DC | PRN

## 2011-11-04 MED ORDER — ALENDRONATE SODIUM 70 MG PO TABS: 70.0000 mg | ORAL_TABLET | ORAL | Status: DC

## 2011-11-04 NOTE — Telephone Encounter (Signed)
I called and discussed with patient and she stated she came in for her physical and Dr.Lowne did not send her medication correctly and when she requested the medications on 10/26/11 it was never sent, she wants all of her mediations to be sent for a 90 day supply instead of only being 30 and it is taking her 2 weeks to receive her medications. I made he patient aware I have no control over the medications and what the provider sends and i have no control over the mail order company and when they send out the medication, I told her if the request comes in I handle it int he appropriate time but I am not sure why there is a pharmacy hold up, I made the patient  Dr.Lowne and myself had been out of the office and the first time I got the request for Ambien yesterday and I got it approved by Dr.Hopper. I called [facility] and they had not received the Ambien so I called it in as directed. Patient is requesting    Klonopin #90 and Flexeril #180-- she would like both medications sent to [facility]. I called [facility] and confirmed that the Klonopin #30 had been received on 10/17 they spoke with the patient and the Rx had been shipped on 11/02/11. All she needs is the Flexeril.  Please advise       KP

## 2011-11-04 NOTE — Telephone Encounter (Signed)
Ok for #180 Flexeril.  Based on the aggressive tone of pt's email, please make Dr Laury Axon aware when she returns.

## 2011-11-04 NOTE — Telephone Encounter (Signed)
Rx for flexeril sent with a Q# 180 and msg forwarded to MD for review.      KP

## 2011-11-04 NOTE — Telephone Encounter (Signed)
See phone note--   KP 

## 2011-11-04 NOTE — Telephone Encounter (Signed)
From Haig Prophet To Lelon Perla, DO [P [id number]] Composed 11/04/2011 12:04 AM For Delivery On 11/04/2011 12:04 AM Subject Non-Urgent Medical Question Message Type Patient Medical Advice Request Read Status Y Message Body What's going on with dr Laury Axon ?? No, I went back and found the request.. Why prescriptions are still wrong.. U have till Friday to have them all corrected .. Yes .Marland Kitchen I ordered Remus Loffler the day I had my physical and I sent u and email since them. Plus keith and I both have said everything had to be 90 day scrips.. Get them right ASAP .Marland Kitchen If Dr Laury Axon doesn't understand this get another doctor to do it.. If u look at all the emails I have sent u multiple emails over the last few weeks .Marland Kitchen Read them. Just call [facility] the ambien in and I will send u the bill if insurance want pay for it... Yes I am MADD . I haven't slept in over 24 hrs.. I have been out a month. I finally got my flexiral today and it took a month also.. Well, dam it .Marland Kitchen It was only 30 pills ..for 90 days, really?? If computers can't count find a calculator... I don't understand the [facility] 90 deal but u should.. Call me.. [phone] or [phone].. Oh, and still no clonopin ..3 pill left... I am very anxious because my right knee replacement has a damaged part and I am expecting a grandbaby any minute..help [place] with this 90day issue.Marland Kitchen Mellody Dance has sit here and asked me 5 times where is dr. Laury Axon...   ----- Message -----  From: Almeta Monas, CMA  Sent: 11/03/2011 8:06 AM EDT  To: Haig Prophet  Subject: RE: Non-Urgent Medical Question   No request for Ambien has come in since June. I have the request now and I will forward it to the provider covering for Dr.Lowne as well as your request for Ritalin. In the future if you have requested a prescription and have not received a response in 48 hours, call the office so we can take care of the prescription without you running out of medication. On another note your prescription  for Ritalin has been approved by Dr.Tabori for 30 pills and you can come by the office to pick it up at your convenience.   Thank You  Soledad Gerlach CMA   ----- Message -----  From: Haig Prophet  Sent: 11/03/2011 12:20 AM EDT  To: Loreen Freud, DO  Subject: Non-Urgent Medical Question   Sent in request for ambien 10/14 [facility] still hasn't got order.. I haven't filled it in 2 months but down to 3 pills now.. it takes 5 + business dayys to get it.. hope u are back to work.. everytime in past 2 Years when we have medication issue u are out.. hope u r better. I guess the rx for the Ritalin got tossed in mixture in oldmail and news paper. She Mellody Dance and I both need rx.. let me know today.. I will be working in [place]... Britta Mccreedy and Garnette Scheuermann.Marland Kitchen

## 2011-11-05 NOTE — Telephone Encounter (Signed)
Also let her know a new pain management and narcotic , bzd plan is being put in place and we will call if she decides to stay so she and her husband can come in to start.

## 2011-11-05 NOTE — Telephone Encounter (Signed)
MRS Latoya, Ramirez been on vacation and have not had access to the computer for almost 2 weeks.  Others were covering for me.  I'm sorry you are so mad and if you do not think I am taking care of you then I will understand if you need to find a new doctor .  While you are upset over your RX , I understand.  I do not appreciate threats.   I will be back in the office tomorrow but keep in mind i've been gone for a while and have many messages, labs etc to get through.

## 2011-11-07 ENCOUNTER — Encounter: Payer: Self-pay | Admitting: Family Medicine

## 2011-11-09 ENCOUNTER — Other Ambulatory Visit: Payer: Self-pay | Admitting: Family Medicine

## 2011-11-09 DIAGNOSIS — F988 Other specified behavioral and emotional disorders with onset usually occurring in childhood and adolescence: Secondary | ICD-10-CM

## 2011-11-09 MED ORDER — METHYLPHENIDATE HCL 10 MG PO TABS: 10.0000 mg | ORAL_TABLET | Freq: Three times a day (TID) | ORAL | Status: DC

## 2011-11-29 ENCOUNTER — Encounter: Payer: Self-pay | Admitting: Family Medicine

## 2011-11-30 ENCOUNTER — Encounter: Payer: Self-pay | Admitting: Family Medicine

## 2011-11-30 MED ORDER — CLONAZEPAM 1 MG PO TABS: 1.0000 mg | ORAL_TABLET | Freq: Every evening | ORAL | Status: DC | PRN

## 2012-01-04 ENCOUNTER — Other Ambulatory Visit: Payer: Self-pay | Admitting: Family Medicine

## 2012-01-04 MED ORDER — ZOLPIDEM TARTRATE 10 MG PO TABS: 10.0000 mg | ORAL_TABLET | Freq: Every evening | ORAL | Status: DC | PRN

## 2012-01-04 NOTE — Telephone Encounter (Signed)
Last OV 9.27.13.  OK to refill?

## 2012-01-04 NOTE — Telephone Encounter (Signed)
Refill done.  

## 2012-01-04 NOTE — Telephone Encounter (Signed)
Ok for #30, no refills.  Will need to wait on PCP to get 90 day supply

## 2012-01-04 NOTE — Telephone Encounter (Signed)
REFILL Zolpidem Tartrate (Tab) AMBIEN 10 MG Take 1 tablet (10 mg total) by mouth at bedtime as needed. requesting 90-day supply -- last wrt 10.22.13

## 2012-01-05 ENCOUNTER — Encounter: Payer: Self-pay | Admitting: Family Medicine

## 2012-01-05 ENCOUNTER — Other Ambulatory Visit: Payer: Self-pay

## 2012-01-05 DIAGNOSIS — M1990 Unspecified osteoarthritis, unspecified site: Secondary | ICD-10-CM

## 2012-01-05 MED ORDER — TRAMADOL HCL 50 MG PO TABS: 50.0000 mg | ORAL_TABLET | Freq: Four times a day (QID) | ORAL | Status: DC | PRN

## 2012-01-05 MED ORDER — ZOLPIDEM TARTRATE 10 MG PO TABS: 10.0000 mg | ORAL_TABLET | Freq: Every evening | ORAL | Status: DC | PRN

## 2012-01-12 ENCOUNTER — Other Ambulatory Visit: Payer: Self-pay

## 2012-01-12 DIAGNOSIS — M1990 Unspecified osteoarthritis, unspecified site: Secondary | ICD-10-CM

## 2012-01-12 DIAGNOSIS — F988 Other specified behavioral and emotional disorders with onset usually occurring in childhood and adolescence: Secondary | ICD-10-CM

## 2012-01-12 MED ORDER — METHYLPHENIDATE HCL 10 MG PO TABS: 10.0000 mg | ORAL_TABLET | Freq: Three times a day (TID) | ORAL | Status: DC

## 2012-01-12 MED ORDER — TRAMADOL HCL 50 MG PO TABS: 50.0000 mg | ORAL_TABLET | Freq: Four times a day (QID) | ORAL | Status: DC | PRN

## 2012-01-12 NOTE — Telephone Encounter (Signed)
Call from patient requesting Tramadol refill. She normally gets #120 and she only got #30 on 01/05/12. Please advise     KP

## 2012-01-12 NOTE — Telephone Encounter (Signed)
#  30  Is 1 week--- refill 120

## 2012-02-03 ENCOUNTER — Ambulatory Visit (INDEPENDENT_AMBULATORY_CARE_PROVIDER_SITE_OTHER): Payer: Medicare Other | Admitting: Family Medicine

## 2012-02-03 ENCOUNTER — Encounter: Payer: Self-pay | Admitting: Family Medicine

## 2012-02-03 VITALS — BP 114/72 | HR 82 | Temp 98.3°F | Wt 143.0 lb

## 2012-02-03 DIAGNOSIS — R21 Rash and other nonspecific skin eruption: Secondary | ICD-10-CM | POA: Insufficient documentation

## 2012-02-03 DIAGNOSIS — J45909 Unspecified asthma, uncomplicated: Secondary | ICD-10-CM

## 2012-02-03 DIAGNOSIS — J209 Acute bronchitis, unspecified: Secondary | ICD-10-CM

## 2012-02-03 MED ORDER — AMOXICILLIN 500 MG PO CAPS: 500.0000 mg | ORAL_CAPSULE | Freq: Two times a day (BID) | ORAL | Status: DC

## 2012-02-03 MED ORDER — GUAIFENESIN-CODEINE 100-10 MG/5ML PO SYRP: 10.0000 mL | ORAL_SOLUTION | Freq: Three times a day (TID) | ORAL | Status: DC | PRN

## 2012-02-03 NOTE — Patient Instructions (Addendum)
Start the Amoxicillin for the bronchitis and the sinus tenderness Use the cough syrup as needed- may cause drowsiness Drink plenty of fluids REST! Use hydrocortisone or benadryl cream if the rash re-appears Call with any questions or concerns Hang in there!

## 2012-02-03 NOTE — Assessment & Plan Note (Signed)
New to provider.  No rash seen today.  No facial or tongue involvement that would be indicative of serious allergic reaction or angioedema.  Reviewed supportive care and red flags that should prompt return.  Pt expressed understanding and is in agreement w/ plan.

## 2012-02-03 NOTE — Progress Notes (Signed)
  Subjective:    Patient ID: [name], female    DOB: February 15, 1950, 62 y.o.   MRN: [id number]  HPI URI- 'i got bronchitis going on'.  + dry cough, w/ chest tightness (started yesterday).  Started feeling poorly 'a few days ago'.  + sore throat, nasal congestion.  Used albuterol inhaler last night w/ some relief.  'a swig of liquor for cough'.  No facial pain/pressure.  No ear pain.  No N/V/D.  No body aches.  Hx of recurrent bronchitis.  + sick contacts.  No fevers.  Neck rash- woke up overnight itching, had red ring around her neck, 'it was on fire'.  Improved w/ benadryl cream.  No new or different foods or meds.  No swelling or difficulty breathing/swallowing/talking.   Review of Systems For ROS see HPI     Objective:   Physical Exam  Vitals reviewed. Constitutional: She appears well-developed and well-nourished. No distress.  HENT:  Head: Normocephalic and atraumatic.       TMs normal bilaterally Mild nasal congestion Throat w/out erythema, edema, or exudate + TTP over R maxillary sinus  Eyes: Conjunctivae normal and EOM are normal. Pupils are equal, round, and reactive to light.  Neck: Normal range of motion. Neck supple.  Cardiovascular: Normal rate, regular rhythm, normal heart sounds and intact distal pulses.   No murmur heard. Pulmonary/Chest: Effort normal and breath sounds normal. No respiratory distress. She has no wheezes.       + hacking cough  Lymphadenopathy:    She has no cervical adenopathy.  Skin: Skin is warm and dry. No rash noted. No erythema.          Assessment & Plan:

## 2012-02-03 NOTE — Assessment & Plan Note (Signed)
New.  Pt w/ cough and R maxillary sinus tenderness.  Due to underlying asthma will start abx.  Cough meds prn.  Reviewed supportive care and red flags that should prompt return.  Pt expressed understanding and is in agreement w/ plan.

## 2012-02-29 ENCOUNTER — Encounter: Payer: Self-pay | Admitting: Family Medicine

## 2012-03-02 ENCOUNTER — Encounter: Payer: Self-pay | Admitting: Family Medicine

## 2012-03-02 DIAGNOSIS — M1990 Unspecified osteoarthritis, unspecified site: Secondary | ICD-10-CM

## 2012-03-02 DIAGNOSIS — M797 Fibromyalgia: Secondary | ICD-10-CM

## 2012-03-02 NOTE — Telephone Encounter (Signed)
Last filled Tramadol 01/12/12 # 120, Klonopin 11/30/11 #90 and Lyrica 10/03/11 #180 with 3 refill. Please advise     KP

## 2012-03-04 MED ORDER — ZOLPIDEM TARTRATE 10 MG PO TABS: 10.0000 mg | ORAL_TABLET | Freq: Every evening | ORAL | Status: DC | PRN

## 2012-03-04 MED ORDER — CLONAZEPAM 1 MG PO TABS: 1.0000 mg | ORAL_TABLET | Freq: Every evening | ORAL | Status: DC | PRN

## 2012-03-04 MED ORDER — TRAMADOL HCL 50 MG PO TABS: 50.0000 mg | ORAL_TABLET | Freq: Four times a day (QID) | ORAL | Status: DC | PRN

## 2012-03-04 MED ORDER — PREGABALIN 150 MG PO CAPS: 300.0000 mg | ORAL_CAPSULE | Freq: Every day | ORAL | Status: DC

## 2012-04-18 ENCOUNTER — Encounter: Payer: Self-pay | Admitting: Family Medicine

## 2012-04-18 DIAGNOSIS — M1990 Unspecified osteoarthritis, unspecified site: Secondary | ICD-10-CM

## 2012-04-18 DIAGNOSIS — F988 Other specified behavioral and emotional disorders with onset usually occurring in childhood and adolescence: Secondary | ICD-10-CM

## 2012-04-18 MED ORDER — METHYLPHENIDATE HCL 10 MG PO TABS: 10.0000 mg | ORAL_TABLET | Freq: Three times a day (TID) | ORAL | Status: DC

## 2012-04-18 MED ORDER — TRAMADOL HCL 50 MG PO TABS: 50.0000 mg | ORAL_TABLET | Freq: Four times a day (QID) | ORAL | Status: DC | PRN

## 2012-04-18 NOTE — Telephone Encounter (Signed)
Last filled 03/04/12 #120. Please advise     KP

## 2012-04-19 ENCOUNTER — Encounter: Payer: Self-pay | Admitting: Family Medicine

## 2012-04-19 MED ORDER — METHYLPHENIDATE HCL 10 MG PO TABS: 10.0000 mg | ORAL_TABLET | Freq: Three times a day (TID) | ORAL | Status: DC

## 2012-04-19 NOTE — Addendum Note (Signed)
Addended by: Arnette Norris on: 04/19/2012 08:05 AM   Modules accepted: Orders

## 2012-04-20 ENCOUNTER — Encounter: Payer: Self-pay | Admitting: Family Medicine

## 2012-05-02 ENCOUNTER — Telehealth: Payer: Self-pay | Admitting: Family Medicine

## 2012-05-02 NOTE — Telephone Encounter (Signed)
Rx was sent for #180 with 3 refills.     KP

## 2012-05-02 NOTE — Telephone Encounter (Signed)
New prescription request  lyrica capsule

## 2012-05-04 ENCOUNTER — Encounter: Payer: Self-pay | Admitting: Family Medicine

## 2012-05-04 DIAGNOSIS — M797 Fibromyalgia: Secondary | ICD-10-CM

## 2012-05-04 MED ORDER — PREGABALIN 150 MG PO CAPS: 300.0000 mg | ORAL_CAPSULE | Freq: Every day | ORAL | Status: DC

## 2012-05-19 ENCOUNTER — Encounter: Payer: Self-pay | Admitting: Family Medicine

## 2012-05-20 ENCOUNTER — Other Ambulatory Visit: Payer: Self-pay | Admitting: Family Medicine

## 2012-05-20 ENCOUNTER — Encounter: Payer: Self-pay | Admitting: Family Medicine

## 2012-05-20 DIAGNOSIS — F988 Other specified behavioral and emotional disorders with onset usually occurring in childhood and adolescence: Secondary | ICD-10-CM

## 2012-05-20 MED ORDER — MELOXICAM 15 MG PO TABS: ORAL_TABLET | ORAL | Status: DC

## 2012-05-20 NOTE — Telephone Encounter (Signed)
My-chart message sent to the patient advising 24-48 hour medication refill policy and she is overdue for an apt I have made er aware to schedule a follow up.     KP

## 2012-05-20 NOTE — Telephone Encounter (Signed)
Last filled in sept ---is she taking it?

## 2012-05-20 NOTE — Telephone Encounter (Signed)
Please advise      KP 

## 2012-05-20 NOTE — Telephone Encounter (Signed)
Patient called again regarding her ritalin and clonazepam prescriptions. Information forwarded to [facility].

## 2012-05-20 NOTE — Telephone Encounter (Signed)
Pt called to get a refill on her ritalin medication and also pt stated that she needs to be able to pick it up before she goes to class at 3:30pm today. Also pt stated that she talk to dr Laury Axon earlier through e-mail about other medications as well.  thanks

## 2012-05-20 NOTE — Telephone Encounter (Signed)
Last OV 04-19-12, last filled 10-09-11

## 2012-05-23 MED ORDER — CLONAZEPAM 1 MG PO TABS: 1.0000 mg | ORAL_TABLET | Freq: Every evening | ORAL | Status: DC | PRN

## 2012-05-23 MED ORDER — METHYLPHENIDATE HCL 10 MG PO TABS: 10.0000 mg | ORAL_TABLET | Freq: Three times a day (TID) | ORAL | Status: DC

## 2012-05-23 NOTE — Telephone Encounter (Signed)
Last seen 10/09/11 and filled 03/04/12 #90. Please advise     KP

## 2012-05-24 ENCOUNTER — Ambulatory Visit (INDEPENDENT_AMBULATORY_CARE_PROVIDER_SITE_OTHER): Payer: Medicare Other | Admitting: Family Medicine

## 2012-05-24 ENCOUNTER — Encounter: Payer: Self-pay | Admitting: Family Medicine

## 2012-05-24 VITALS — BP 110/64 | HR 73 | Temp 98.4°F | Wt 140.4 lb

## 2012-05-24 DIAGNOSIS — D239 Other benign neoplasm of skin, unspecified: Secondary | ICD-10-CM

## 2012-05-24 DIAGNOSIS — G4700 Insomnia, unspecified: Secondary | ICD-10-CM

## 2012-05-24 DIAGNOSIS — N898 Other specified noninflammatory disorders of vagina: Secondary | ICD-10-CM

## 2012-05-24 DIAGNOSIS — D229 Melanocytic nevi, unspecified: Secondary | ICD-10-CM

## 2012-05-24 DIAGNOSIS — E785 Hyperlipidemia, unspecified: Secondary | ICD-10-CM

## 2012-05-24 DIAGNOSIS — N9489 Other specified conditions associated with female genital organs and menstrual cycle: Secondary | ICD-10-CM

## 2012-05-24 DIAGNOSIS — F988 Other specified behavioral and emotional disorders with onset usually occurring in childhood and adolescence: Secondary | ICD-10-CM

## 2012-05-24 MED ORDER — ZOLPIDEM TARTRATE 5 MG PO TABS: 5.0000 mg | ORAL_TABLET | Freq: Every evening | ORAL | Status: DC | PRN

## 2012-05-24 NOTE — Patient Instructions (Addendum)

## 2012-05-25 DIAGNOSIS — D229 Melanocytic nevi, unspecified: Secondary | ICD-10-CM | POA: Insufficient documentation

## 2012-05-25 NOTE — Assessment & Plan Note (Signed)
Cont meds   

## 2012-05-25 NOTE — Assessment & Plan Note (Signed)
Refer to derm  Most likely benign keratosis

## 2012-05-25 NOTE — Assessment & Plan Note (Signed)
Check labs con't meds 

## 2012-05-25 NOTE — Progress Notes (Signed)
  Subjective:    Patient ID: [name], female    DOB: February 14, 1950, 62 y.o.   MRN: [id number]  HPI Pt here for f/u.   Pt c/o nodule on scalp that has been there for a long time but is now getting irritated easier with brushing hair etc. j  Review of Systems As above    Objective:   Physical Exam   BP 110/64  Pulse 73  Temp(Src) 98.4 F (36.9 C) (Oral)  Wt 140 lb 6.4 oz (63.685 kg)  BMI 31.76 kg/m2  SpO2 95% General appearance: alert, cooperative, appears stated age and no distress Head: + warty nodule on scalp Neck: no adenopathy, no carotid bruit, no JVD, supple, symmetrical, trachea midline and thyroid not enlarged, symmetric, no tenderness/mass/nodules Lungs: clear to auscultation bilaterally Heart: regular rate and rhythm, S1, S2 normal, no murmur, click, rub or gallop Extremities: extremities normal, atraumatic, no cyanosis or edema       Assessment & Plan:

## 2012-05-26 ENCOUNTER — Encounter: Payer: Self-pay | Admitting: Family Medicine

## 2012-05-26 DIAGNOSIS — G4700 Insomnia, unspecified: Secondary | ICD-10-CM

## 2012-05-26 LAB — POCT URINALYSIS DIPSTICK
Bilirubin, UA: NEGATIVE
Glucose, UA: NEGATIVE
Leukocytes, UA: NEGATIVE
Nitrite, UA: NEGATIVE
pH, UA: 8

## 2012-05-27 NOTE — Telephone Encounter (Signed)
Last seen and filled 05/24/12 #15 with 1 refill. Please advise.     KP

## 2012-05-31 ENCOUNTER — Encounter: Payer: Self-pay | Admitting: Family Medicine

## 2012-05-31 DIAGNOSIS — G4700 Insomnia, unspecified: Secondary | ICD-10-CM

## 2012-05-31 MED ORDER — ZOLPIDEM TARTRATE 5 MG PO TABS: 5.0000 mg | ORAL_TABLET | Freq: Every evening | ORAL | Status: DC | PRN

## 2012-05-31 NOTE — Addendum Note (Signed)
Addended by: Arnette Norris on: 05/31/2012 09:15 AM   Modules accepted: Orders

## 2012-05-31 NOTE — Telephone Encounter (Signed)
It was filled and given to the patient while she was in the office      KP

## 2012-06-01 ENCOUNTER — Encounter: Payer: Self-pay | Admitting: Family Medicine

## 2012-06-01 LAB — BASIC METABOLIC PANEL
CO2, serum: 26
Calcium: 9.8 mg/dL
Creat: 0.81
Sodium, serum: 140

## 2012-06-01 LAB — LIPID PANEL: HDL: 57 mg/dL (ref 35–70)

## 2012-06-13 ENCOUNTER — Encounter: Payer: Self-pay | Admitting: Family Medicine

## 2012-06-17 ENCOUNTER — Encounter: Payer: Self-pay | Admitting: Family Medicine

## 2012-06-19 IMAGING — US US ABDOMEN COMPLETE
1 series · 13 of 25 positions shown · non-contrast
Comparison: [HOSPITAL] CT scan exam dated  10/31/2009

CLINICAL DATA: Elevated LFTs

ABDOMEN ULTRASOUND
TECHNIQUE: Complete abdominal ultrasound examination was performed
including evaluation of the liver, gallbladder, bile ducts,
pancreas, kidneys, spleen, IVC, and abdominal aorta.

[Series 1: us abdomen complete · 0.23mm/px · 13 of 80 slices shown]
[im 1/80]
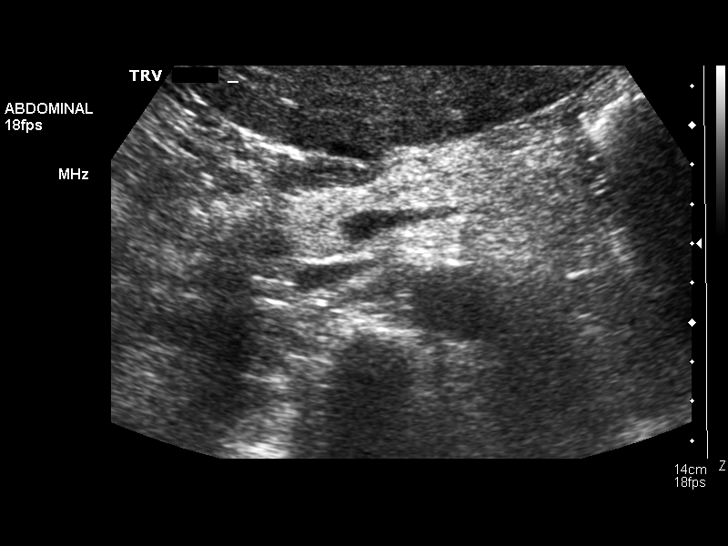
[im 7/80]
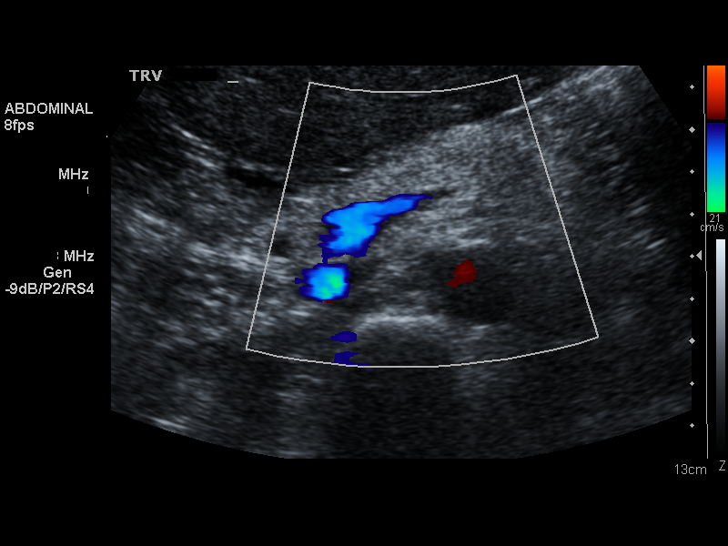
[im 14/80]
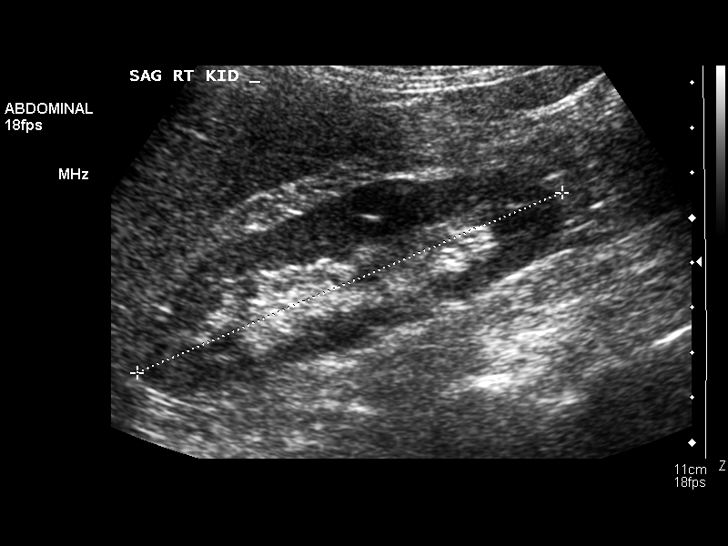
[im 20/80]
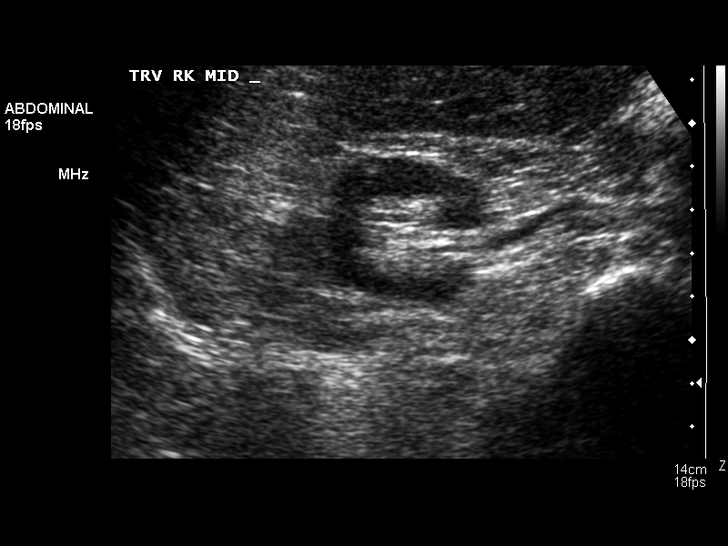
[im 27/80]
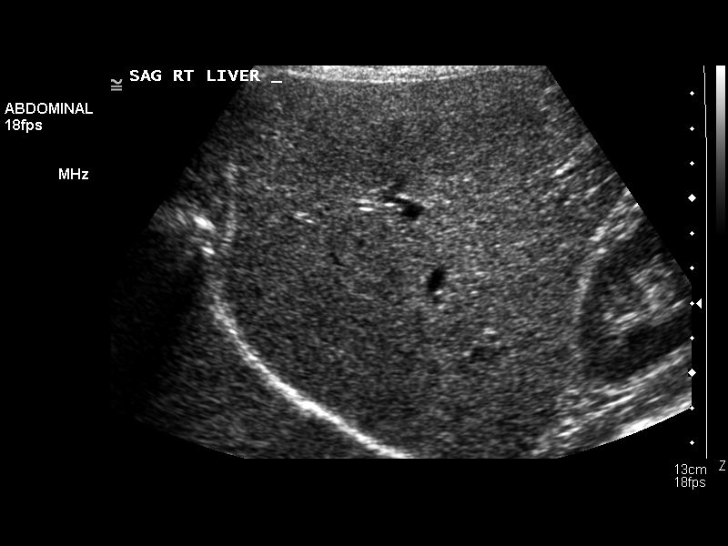
[im 33/80]
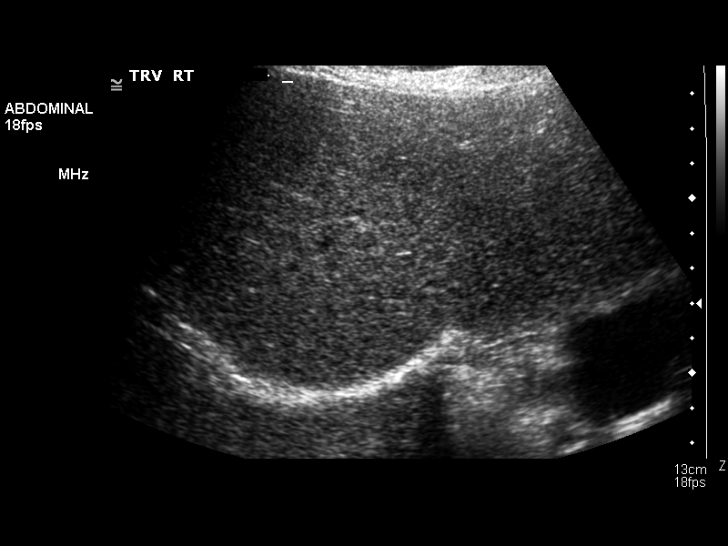
[im 40/80]
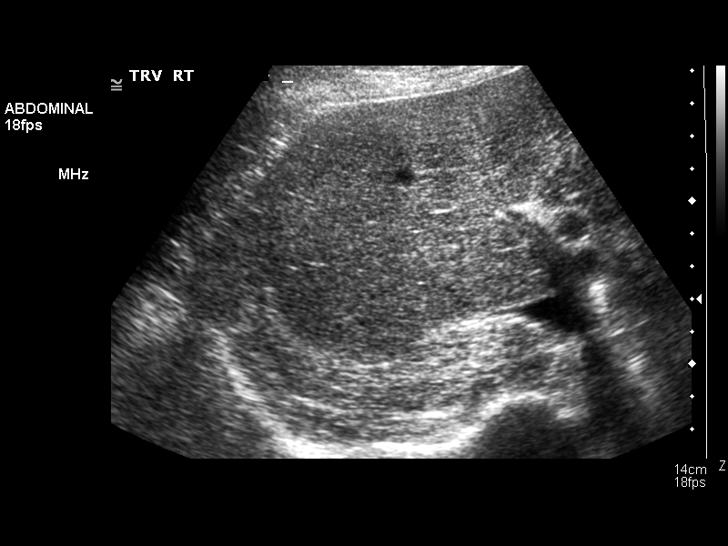
[im 47/80]
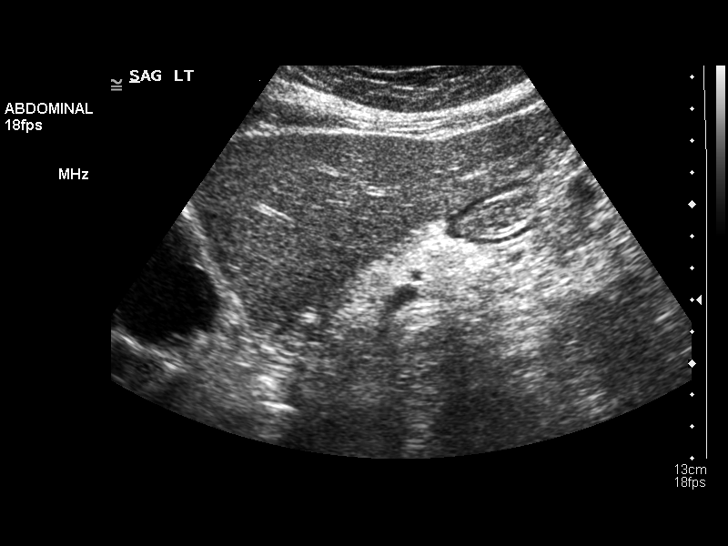
[im 53/80]
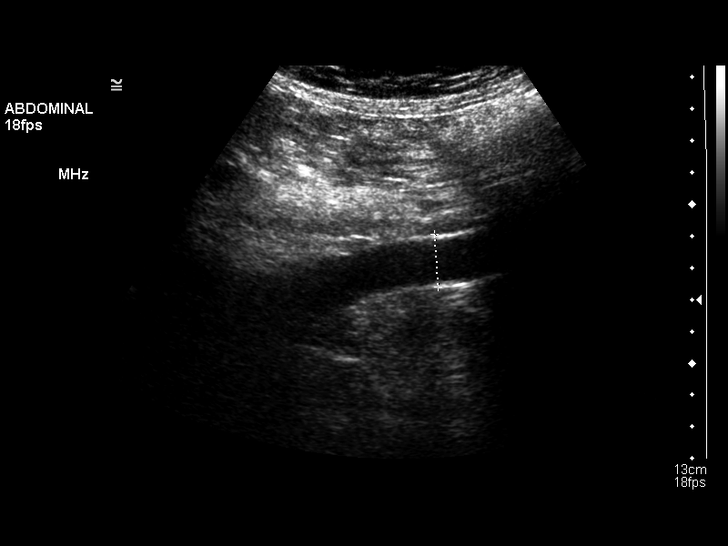
[im 60/80]
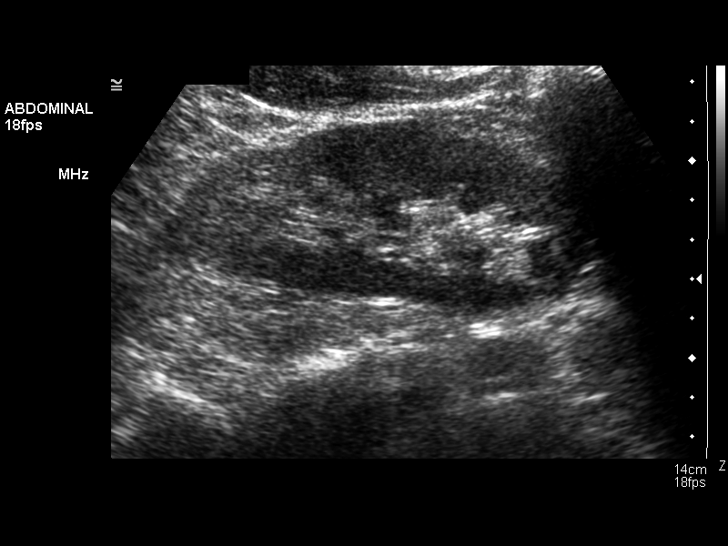
[im 66/80]
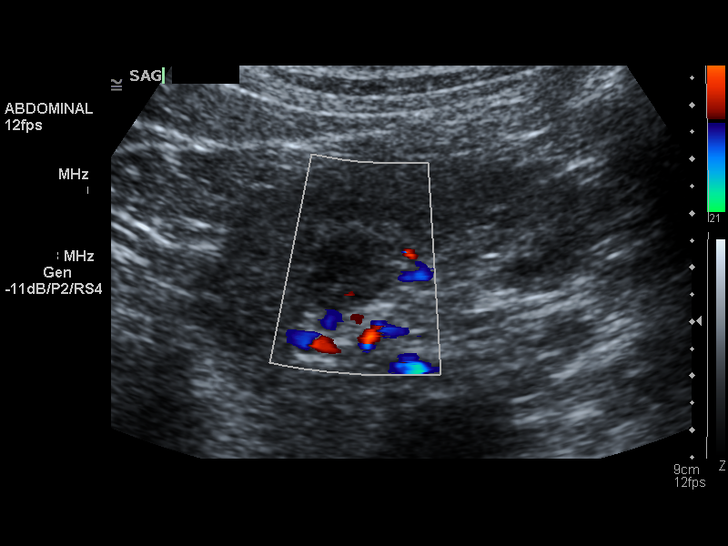
[im 73/80]
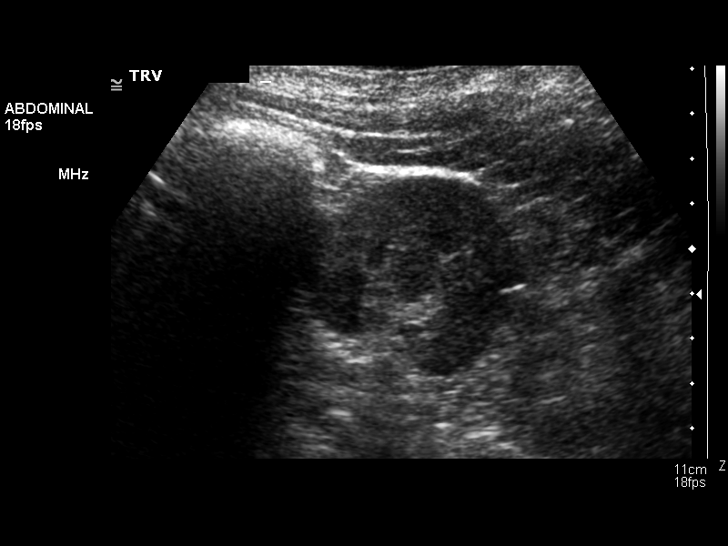
[im 80/80]
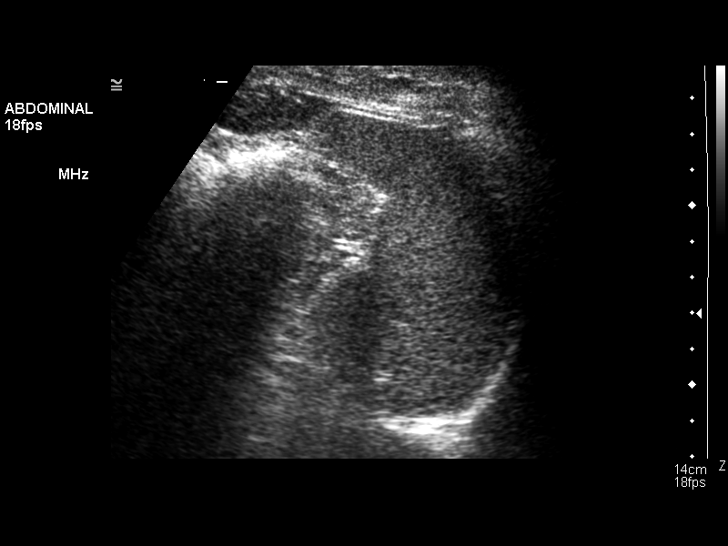

[13 of 25 positions shown; findings below may reference images not displayed]

FINDINGS: Gallbladder:  Surgically absent

Common Bile Duct:  Minimally distended at 9 mm proximally and 5 mm
distally.  This is not uncommon after cholecystectomy.  There is no
evidence for intrahepatic biliary duct dilatation.

Liver:  Unremarkable

IVC:  Normal.

Pancreas:  Normal.

Spleen:  Normal.

Right kidney:  10.3 cm in long axis.  Normal.

Left kidney:  10.9 cm in long axis.  2.4 cm apparent soft tissue
lesion medial to the left kidney, possibly arising from the left
kidney.

Abdominal Aorta:  No aneurysm.
IMPRESSION: No evidence for intrahepatic biliary duct dilatation.  There is
some mild prominence of the extrahepatic common duct, but this is
not uncommon after cholecystectomy.  Follow-up imaging could be
used to ensure that this remains stable.

2.4 cm apparent soft tissue lesion just medial to the left kidney.
Review of the previous CT scan shows no mass lesion at this
location.  Consider  MRI without and with contrast to further
evaluate.

## 2012-06-21 ENCOUNTER — Encounter: Payer: Self-pay | Admitting: Family Medicine

## 2012-06-21 DIAGNOSIS — N898 Other specified noninflammatory disorders of vagina: Secondary | ICD-10-CM

## 2012-06-21 MED ORDER — CYCLOBENZAPRINE HCL 10 MG PO TABS: 10.0000 mg | ORAL_TABLET | Freq: Two times a day (BID) | ORAL | Status: DC | PRN

## 2012-06-21 MED ORDER — ESTROGENS, CONJUGATED 0.625 MG/GM VA CREA: TOPICAL_CREAM | VAGINAL | Status: DC

## 2012-06-21 NOTE — Telephone Encounter (Signed)
Last filled 11/04/11 #180. Please advise      KP

## 2012-07-17 ENCOUNTER — Encounter: Payer: Self-pay | Admitting: Family Medicine

## 2012-07-17 DIAGNOSIS — F988 Other specified behavioral and emotional disorders with onset usually occurring in childhood and adolescence: Secondary | ICD-10-CM

## 2012-07-17 DIAGNOSIS — M1990 Unspecified osteoarthritis, unspecified site: Secondary | ICD-10-CM

## 2012-07-18 MED ORDER — METHYLPHENIDATE HCL 10 MG PO TABS: 10.0000 mg | ORAL_TABLET | Freq: Three times a day (TID) | ORAL | Status: DC

## 2012-07-18 MED ORDER — TRAMADOL HCL 50 MG PO TABS: 50.0000 mg | ORAL_TABLET | Freq: Four times a day (QID) | ORAL | Status: DC | PRN

## 2012-07-22 ENCOUNTER — Encounter: Payer: Self-pay | Admitting: Family Medicine

## 2012-07-27 ENCOUNTER — Other Ambulatory Visit: Payer: Self-pay | Admitting: Family Medicine

## 2012-07-28 ENCOUNTER — Encounter: Payer: Self-pay | Admitting: Family Medicine

## 2012-07-28 NOTE — Telephone Encounter (Signed)
Last seen 05/24/12 and filled 07/18/12 #120. Please advise      KP

## 2012-08-23 ENCOUNTER — Encounter: Payer: Self-pay | Admitting: Family Medicine

## 2012-08-23 DIAGNOSIS — G4700 Insomnia, unspecified: Secondary | ICD-10-CM

## 2012-08-24 MED ORDER — CLONAZEPAM 1 MG PO TABS: 1.0000 mg | ORAL_TABLET | Freq: Every evening | ORAL | Status: DC | PRN

## 2012-08-24 MED ORDER — ZOLPIDEM TARTRATE 5 MG PO TABS: 5.0000 mg | ORAL_TABLET | Freq: Every evening | ORAL | Status: DC | PRN

## 2012-08-24 MED ORDER — LORAZEPAM 1 MG PO TABS: 1.0000 mg | ORAL_TABLET | Freq: Two times a day (BID) | ORAL | Status: DC | PRN

## 2012-08-24 NOTE — Telephone Encounter (Signed)
The patient is requesting 90 day supplies. Please advise on the quantity of each medication since she thinks I mess up the quantity each time the med's are filled. Thanks     KP

## 2012-09-22 ENCOUNTER — Encounter: Payer: Self-pay | Admitting: Family Medicine

## 2012-09-22 DIAGNOSIS — F988 Other specified behavioral and emotional disorders with onset usually occurring in childhood and adolescence: Secondary | ICD-10-CM

## 2012-09-23 MED ORDER — METHYLPHENIDATE HCL 10 MG PO TABS: 10.0000 mg | ORAL_TABLET | Freq: Three times a day (TID) | ORAL | Status: DC

## 2012-09-23 NOTE — Telephone Encounter (Signed)
Ok to give 3 months supply 

## 2012-09-23 NOTE — Telephone Encounter (Signed)
Noted med filled.

## 2012-09-23 NOTE — Addendum Note (Signed)
Addended by: Arnette Norris on: 09/23/2012 12:08 PM   Modules accepted: Orders

## 2012-09-23 NOTE — Telephone Encounter (Signed)
Pt would like a 90 day refill of Ritalin like her husband receives. Would like this to be picked up when husband comes in for appt.   Last OV 05-24-12 Med last filled 07-18-12 #90 with 0 refills   Low Risk

## 2012-10-03 ENCOUNTER — Encounter: Payer: Self-pay | Admitting: Family Medicine

## 2012-10-03 DIAGNOSIS — G4700 Insomnia, unspecified: Secondary | ICD-10-CM

## 2012-10-03 MED ORDER — ZOLPIDEM TARTRATE 5 MG PO TABS: 5.0000 mg | ORAL_TABLET | Freq: Every evening | ORAL | Status: DC | PRN

## 2012-10-03 MED ORDER — TRAMADOL HCL 50 MG PO TABS: ORAL_TABLET | ORAL | Status: DC

## 2012-10-03 NOTE — Telephone Encounter (Signed)
Last seen 05/24/12 and Tramadol filled 07/27/12 #120 and Ambien filled 08/24/12 #30. Please advise      KP

## 2012-10-03 NOTE — Telephone Encounter (Signed)
Rx sent      KP 

## 2012-10-09 ENCOUNTER — Encounter: Payer: Self-pay | Admitting: Family Medicine

## 2012-10-09 DIAGNOSIS — M797 Fibromyalgia: Secondary | ICD-10-CM

## 2012-10-10 MED ORDER — PREGABALIN 150 MG PO CAPS: 300.0000 mg | ORAL_CAPSULE | Freq: Every day | ORAL | Status: DC

## 2012-10-10 NOTE — Telephone Encounter (Signed)
Med filled.  

## 2012-10-17 ENCOUNTER — Other Ambulatory Visit: Payer: Self-pay | Admitting: Family Medicine

## 2012-11-01 ENCOUNTER — Telehealth: Payer: Self-pay | Admitting: Family Medicine

## 2012-11-01 NOTE — Telephone Encounter (Signed)
Called patient to schedule 30 min OV for surgery clearance. Her surgery is scheduled for 12/26/12 with [facility].

## 2012-11-02 ENCOUNTER — Other Ambulatory Visit: Payer: Self-pay | Admitting: Family Medicine

## 2012-11-03 MED ORDER — TRAMADOL HCL 50 MG PO TABS: ORAL_TABLET | ORAL | Status: DC

## 2012-11-03 NOTE — Addendum Note (Signed)
Addended by: Arnette Norris on: 11/03/2012 10:20 AM   Modules accepted: Orders

## 2012-11-15 ENCOUNTER — Ambulatory Visit: Payer: 59

## 2012-11-16 ENCOUNTER — Telehealth: Payer: Self-pay

## 2012-11-16 MED ORDER — CLONAZEPAM 1 MG PO TABS: 1.0000 mg | ORAL_TABLET | Freq: Every evening | ORAL | Status: DC | PRN

## 2012-11-16 NOTE — Telephone Encounter (Signed)
Last seen 05/24/12 and filled 08/24/12 #90. Please advise     KP

## 2012-11-16 NOTE — Telephone Encounter (Signed)
Refill x1   2 refills 

## 2012-11-17 ENCOUNTER — Other Ambulatory Visit: Payer: Self-pay

## 2012-11-24 ENCOUNTER — Encounter: Payer: Self-pay | Admitting: Family Medicine

## 2012-11-24 DIAGNOSIS — G4700 Insomnia, unspecified: Secondary | ICD-10-CM

## 2012-11-24 MED ORDER — ZOLPIDEM TARTRATE 5 MG PO TABS: 5.0000 mg | ORAL_TABLET | Freq: Every evening | ORAL | Status: DC | PRN

## 2012-11-24 NOTE — Telephone Encounter (Signed)
Med filled per tabori.  

## 2012-11-25 ENCOUNTER — Ambulatory Visit (INDEPENDENT_AMBULATORY_CARE_PROVIDER_SITE_OTHER): Payer: 59 | Admitting: Family Medicine

## 2012-11-25 ENCOUNTER — Encounter: Payer: Self-pay | Admitting: Family Medicine

## 2012-11-25 VITALS — BP 122/78 | HR 89 | Resp 16 | Ht <= 58 in | Wt 142.8 lb

## 2012-11-25 DIAGNOSIS — E785 Hyperlipidemia, unspecified: Secondary | ICD-10-CM

## 2012-11-25 DIAGNOSIS — J309 Allergic rhinitis, unspecified: Secondary | ICD-10-CM

## 2012-11-25 DIAGNOSIS — F988 Other specified behavioral and emotional disorders with onset usually occurring in childhood and adolescence: Secondary | ICD-10-CM

## 2012-11-25 DIAGNOSIS — Z01810 Encounter for preprocedural cardiovascular examination: Secondary | ICD-10-CM

## 2012-11-25 DIAGNOSIS — J302 Other seasonal allergic rhinitis: Secondary | ICD-10-CM

## 2012-11-25 MED ORDER — FLUTICASONE PROPIONATE 50 MCG/ACT NA SUSP: 2.0000 | Freq: Every day | NASAL | Status: DC

## 2012-11-25 MED ORDER — METHYLPHENIDATE HCL 10 MG PO TABS: 10.0000 mg | ORAL_TABLET | Freq: Three times a day (TID) | ORAL | Status: DC

## 2012-11-25 NOTE — Progress Notes (Signed)
Subjective:    [name] is a 62 y.o. female who presents to the office today for a preoperative consultation at the request of surgeon Dr Charlann Boxer who plans on performing Left Knee: TKA - Medial and lateral w/wo patella resurfacing on December 15. This consultation is requested for the specific conditions prompting preoperative evaluation (i.e. because of potential affect on operative risk): age, asthma. Planned anesthesia: general. The patient has the following known anesthesia issues: no problems with anesthesia. Patients bleeding risk: no recent abnormal bleeding. Patient does not have objections to receiving blood products if needed.  The following portions of the patient's history were reviewed and updated as appropriate:  She  has a past medical history of ADD (attention deficit disorder); Asthma; Depression; GERD (gastroesophageal reflux disease); Hyperlipemia; IBS (irritable bowel syndrome); and Fibromyalgia. She  does not have any pertinent problems on file. She  has past surgical history that includes Cholecystectomy; Abdominal hysterectomy; Cesarean section; Breast surgery; Tonsillectomy; Total knee arthroplasty; Toe amputation; Ventral hernia repair; and Carpal tunnel release. Her family history includes Arthritis in her mother; Cancer in her maternal grandfather and maternal uncle; Heart disease in her mother; Hypertension in her maternal grandmother and paternal grandmother; Lung cancer in her paternal grandfather; Prostate cancer in her paternal grandfather. She  reports that she has never smoked. She has never used smokeless tobacco. She reports that she drinks alcohol. She reports that she does not use illicit drugs. She has a current medication list which includes the following prescription(s): albuterol, alendronate, aripiprazole, celecoxib, clonazepam, conjugated estrogens, cyclobenzaprine, epinephrine, fish oil-omega-3 fatty acids, fluticasone, hydrochlorothiazide, hyoscyamine,  lorazepam, methylphenidate, niacin, omeprazole, pregabalin, tramadol, and zolpidem. Current Outpatient Prescriptions on File Prior to Visit  Medication Sig Dispense Refill  . albuterol (PROAIR HFA) 108 (90 BASE) MCG/ACT inhaler Inhale 2 puffs into the lungs every 6 (six) hours as needed for wheezing.  27 g  0  . alendronate (FOSAMAX) 70 MG tablet Take 1 tablet (70 mg total) by mouth every 7 (seven) days. Take with a full glass of water on an empty stomach.  12 tablet  3  . ARIPiprazole (ABILIFY) 2 MG tablet Take 1 tablet (2 mg total) by mouth daily.  90 tablet  3  . clonazePAM (KLONOPIN) 1 MG tablet Take 1 tablet (1 mg total) by mouth at bedtime as needed for anxiety.  90 tablet  2  . conjugated estrogens (PREMARIN) vaginal cream 1 g pv qd for 2 weeks then decrease to every other day.  Wean down to 0.5 g  2-3 days a week if possible  127.5 g  3  . cyclobenzaprine (FLEXERIL) 10 MG tablet Take 1 tablet (10 mg total) by mouth 2 (two) times daily as needed for muscle spasms.  180 tablet  0  . EPINEPHrine (EPIPEN) 0.3 mg/0.3 mL DEVI Inject 0.3 mLs (0.3 mg total) into the muscle once.  1 Device  2  . fish oil-omega-3 fatty acids 1000 MG capsule Take 2 g by mouth daily.      . fluticasone (FLONASE) 50 MCG/ACT nasal spray Place 2 sprays into the nose daily.  48 g  3  . hydrochlorothiazide (HYDRODIURIL) 25 MG tablet Take 1 tablet (25 mg total) by mouth daily.  90 tablet  3  . hyoscyamine (LEVSIN SL) 0.125 MG SL tablet Place 0.125 mg under the tongue every 4 (four) hours as needed.        Marland Kitchen LORazepam (ATIVAN) 1 MG tablet Take 1 tablet (1 mg total) by  mouth 2 (two) times daily as needed.  30 tablet  0  . methylphenidate (RITALIN) 10 MG tablet Take 1 tablet (10 mg total) by mouth 3 (three) times daily.  270 tablet  0  . niacin (GNP NIACIN) 250 MG tablet Take 250 mg by mouth daily with breakfast.      . omeprazole (PRILOSEC) 20 MG capsule Take 1 capsule by mouth  daily  90 capsule  1  . pregabalin (LYRICA) 150  MG capsule Take 2 capsules (300 mg total) by mouth daily.  180 capsule  0  . traMADol (ULTRAM) 50 MG tablet Take 1 tablet by mouth  every 6 hours as needed  120 tablet  0  . zolpidem (AMBIEN) 5 MG tablet Take 1 tablet (5 mg total) by mouth at bedtime as needed for sleep.  45 tablet  0   No current facility-administered medications on file prior to visit.   She is allergic to cefuroxime axetil; erythromycin; metaxalone; simvastatin; sulfonamide derivatives; and tetracycline..  Review of Systems A comprehensive review of systems was negative except for: Musculoskeletal: positive for knee pain Left    Objective:    BP 122/78  Pulse 89  Resp 16  Ht 4' 7.75" (1.416 m)  Wt 142 lb 12.8 oz (64.774 kg)  BMI 32.31 kg/m2  SpO2 98% General appearance: alert, cooperative, appears stated age, no distress and morbidly obese Head: Normocephalic, without obvious abnormality, atraumatic Eyes: conjunctivae/corneas clear. PERRL, EOM's intact. Fundi benign. Ears: normal TM's and external ear canals both ears Nose: Nares normal. Septum midline. Mucosa normal. No drainage or sinus tenderness. Throat: lips, mucosa, and tongue normal; teeth and gums normal Neck: no adenopathy, no carotid bruit, no JVD, supple, symmetrical, trachea midline and thyroid not enlarged, symmetric, no tenderness/mass/nodules Back: symmetric, no curvature. ROM normal. No CVA tenderness. Lungs: clear to auscultation bilaterally Breasts: deferred Heart: S1, S2 normal Abdomen: soft, non-tender; bowel sounds normal; no masses,  no organomegaly Pelvic: deferred Extremities: extremities normal, atraumatic, no cyanosis or edema Pulses: 2+ and symmetric Skin: Skin color, texture, turgor normal. No rashes or lesions Lymph nodes: Cervical, supraclavicular, and axillary nodes normal. Neurologic: Alert and oriented X 3, normal strength and tone. Normal symmetric reflexes. Normal coordination and gait Psych-- no depression, no  anxiety  Predictors of intubation difficulty:  Morbid obesity  Neck range of motion: normal  Dentition: No chipped, loose, or missing teeth.  Cardiographics ECG: normal sinus rhythm, no blocks or conduction defects, no ischemic changes Echocardiogram: not done  Imaging Chest x-ray: not done   Lab Review  pt has labs at home--- she will bring it by or fax    Assessment:      62 y.o. female with planned surgery as above.   Known risk factors for perioperative complications: None   Difficulty with intubation is not anticipated.  Cardiac Risk Estimation: low  Current medications which may produce withdrawal symptoms if withheld perioperatively: none      Plan:    1. Preoperative workup as follows none. 2. Change in medication regimen before surgery: per surgeon. 3. Prophylaxis for cardiac events with perioperative beta-blockers: not indicated. 4. Invasive hemodynamic monitoring perioperatively: at the discretion of anesthesiologist. 5. Deep vein thrombosis prophylaxis postoperatively:regimen to be chosen by surgical team. 6. Surveillance for postoperative MI with ECG immediately postoperatively and on postoperative days 1 and 2 AND troponin levels 24 hours postoperatively and on day 4 or hospital discharge (whichever comes first): at the discretion of anesthesiologist. 7. Other measures: consult hospitalist  if needed

## 2012-11-25 NOTE — Progress Notes (Signed)
Pre visit review using our clinic review tool, if applicable. No additional management support is needed unless otherwise documented below in the visit note. 

## 2012-11-25 NOTE — Patient Instructions (Signed)
rto  For pap and labs

## 2012-11-26 ENCOUNTER — Encounter: Payer: Self-pay | Admitting: Family Medicine

## 2012-11-26 NOTE — Assessment & Plan Note (Signed)
con't meds  Check labs 

## 2012-11-26 NOTE — Assessment & Plan Note (Signed)
con't meds 

## 2012-12-04 ENCOUNTER — Other Ambulatory Visit: Payer: Self-pay | Admitting: Family Medicine

## 2012-12-05 NOTE — Progress Notes (Signed)
Need orders in EPIC.  Surgery scheduled for 12/26/12.  Thank You.

## 2012-12-12 NOTE — Progress Notes (Signed)
Need orders in EPIC.  Surgery scheduled for 12/26/12.  Preop on 12/19/12 at 200pm/  Thank You.

## 2012-12-14 ENCOUNTER — Encounter (HOSPITAL_COMMUNITY): Payer: Self-pay | Admitting: Pharmacy Technician

## 2012-12-19 ENCOUNTER — Encounter (HOSPITAL_COMMUNITY): Payer: Self-pay

## 2012-12-19 ENCOUNTER — Encounter (HOSPITAL_COMMUNITY)
Admission: RE | Admit: 2012-12-19 | Discharge: 2012-12-19 | Disposition: A | Discharge status: Home / Self Care | Payer: 59 | Source: Ambulatory Visit | Attending: Orthopedic Surgery | Admitting: Orthopedic Surgery

## 2012-12-19 DIAGNOSIS — Z01818 Encounter for other preprocedural examination: Secondary | ICD-10-CM | POA: Diagnosis not present

## 2012-12-19 DIAGNOSIS — Z01812 Encounter for preprocedural laboratory examination: Secondary | ICD-10-CM | POA: Diagnosis not present

## 2012-12-19 HISTORY — DX: Unspecified osteoarthritis, unspecified site: M19.90

## 2012-12-19 HISTORY — DX: Unspecified cataract: H26.9

## 2012-12-19 HISTORY — DX: Bronchitis, not specified as acute or chronic: J40

## 2012-12-19 HISTORY — DX: Inflammatory liver disease, unspecified: K75.9

## 2012-12-19 HISTORY — DX: Age-related osteoporosis without current pathological fracture: M81.0

## 2012-12-19 LAB — BASIC METABOLIC PANEL
BUN: 17 mg/dL (ref 6–23)
CO2: 25 mEq/L (ref 19–32)
Chloride: 103 mEq/L (ref 96–112)
GFR calc Af Amer: 88 mL/min — ABNORMAL LOW (ref >90)
GFR calc non Af Amer: 76 mL/min — ABNORMAL LOW (ref >90)
Glucose, Bld: 104 mg/dL — ABNORMAL HIGH (ref 70–99)
Potassium: 4 mEq/L (ref 3.5–5.1)
Sodium: 140 mEq/L (ref 135–145)

## 2012-12-19 LAB — URINALYSIS, ROUTINE W REFLEX MICROSCOPIC
Bilirubin Urine: NEGATIVE
Glucose, UA: NEGATIVE mg/dL
Hgb urine dipstick: NEGATIVE
Ketones, ur: NEGATIVE mg/dL
Leukocytes, UA: NEGATIVE
Nitrite: NEGATIVE
Specific Gravity, Urine: 1.012 (ref 1.005–1.030)
pH: 7 (ref 5.0–8.0)

## 2012-12-19 LAB — CBC
HCT: 32.7 % — ABNORMAL LOW (ref 36.0–46.0)
Hemoglobin: 10.8 g/dL — ABNORMAL LOW (ref 12.0–15.0)
MCH: 28.3 pg (ref 26.0–34.0)
MCHC: 33 g/dL (ref 30.0–36.0)
RBC: 3.81 MIL/uL — ABNORMAL LOW (ref 3.87–5.11)

## 2012-12-19 LAB — PROTIME-INR
INR: 0.88 (ref 0.00–1.49)
Prothrombin Time: 11.8 seconds (ref 11.6–15.2)

## 2012-12-19 NOTE — Patient Instructions (Addendum)
20 [name]  12/19/2012   Your procedure is scheduled on: 12/26/12  Report to [facility] at 5:30 AM.  Call this number if you have problems the morning of surgery [phone]: [phone]   Remember:   Do not eat food or drink liquids After Midnight.     Take these medicines the morning of surgery with A SIP OF WATER: omeprazole, abilify   Do not wear jewelry, make-up or nail polish.  Do not wear lotions, powders, or perfumes. You may wear deodorant.  Do not shave 48 hours prior to surgery. Men may shave face and neck.  Do not bring valuables to the hospital.  Contacts, dentures or bridgework may not be worn into surgery.  Leave suitcase in the car. After surgery it may be brought to your room.  For patients admitted to the hospital, checkout time is 11:00 AM the day of discharge.    Please read over the following fact sheets that you were given: MRSA Information, incentive spirometry fact sheet, blood fact sheet Birdie Sons, RN  pre op nurse call if needed [phone]    FAILURE TO FOLLOW THESE INSTRUCTIONS MAY RESULT IN CANCELLATION OF YOUR SURGERY   Patient Signature: ___________________________________________

## 2012-12-19 NOTE — Progress Notes (Signed)
Surgery clearance note Dr. Laury Axon on chart, sleep study 04/22/11 on chart, EKG 11/25/12 on EPIC

## 2012-12-21 NOTE — H&P (Signed)
TOTAL KNEE ADMISSION H&P  Patient is being admitted for left total knee arthroplasty.  Subjective:  Chief Complaint:    Left knee OA / pain.  HPI: [name], 62 y.o. female, has a history of pain and functional disability in the left knee due to arthritis and has failed non-surgical conservative treatments for greater than 12 weeks to includeNSAID's and/or analgesics, corticosteriod injections and activity modification.  Onset of symptoms was gradual, starting 8+ years ago with gradually worsening course since that time. The patient noted prior procedures on the knee to include  arthroplasty on the right knee(s).  Patient currently rates pain in the left knee(s) at 7 out of 10 with activity. Patient has worsening of pain with activity and weight bearing, pain that interferes with activities of daily living, pain with passive range of motion, crepitus and joint swelling.  Patient has evidence of periarticular osteophytes and joint space narrowing by imaging studies. There is no active infection.  Risks, benefits and expectations were discussed with the patient.  Risks including but not limited to the risk of anesthesia, blood clots, nerve damage, blood vessel damage, failure of the prosthesis, infection and up to and including death.  Patient understand the risks, benefits and expectations and wishes to proceed with surgery.   D/C Plans:     Home with HHPT  Post-op Meds:    No Rx given  Tranexamic Acid:   To be given  Decadron:    To be given  FYI:    ASA post-op  Norco post-op   Patient Active Problem List   Diagnosis Date Noted  . Atypical mole 05/25/2012  . Rash 02/03/2012  . Bronchitis with asthma, subacute 02/03/2012  . Weight gain 04/15/2011  . Sleep apnea 04/15/2011  . Edema 04/15/2011  . CONSTIPATION 11/05/2009  . ABDOMINAL PAIN, LEFT UPPER QUADRANT 11/05/2009  . OTHER OSTEOPOROSIS 10/24/2009  . UNSPECIFIED VITAMIN D DEFICIENCY 04/17/2009  . POSTMENOPAUSAL STATUS  04/17/2009  . UTI 02/15/2009  . GOITER, UNSPECIFIED 09/05/2008  . HYPERLIPIDEMIA 07/24/2008  . ADD 06/14/2008  . ASTHMA 03/07/2008  . ESOPHAGEAL STRICTURE 03/07/2008  . IBS 03/07/2008  . BENIGN NEOPLASM OF ADRENAL GLAND 01/20/2008  . DEPRESSION 01/20/2008  . GERD 01/20/2008  . VENTRAL HERNIA 01/20/2008  . FIBROCYSTIC BREAST DISEASE 01/20/2008  . RHEUMATOID ARTHRITIS 01/20/2008  . HEPATITIS B, HX OF 01/20/2008  . NASAL POLYPECTOMY, HX OF 01/20/2008  . LOWER LIMB AMPUTATION, OTHER TOE 01/20/2008   Past Medical History  Diagnosis Date  . ADD (attention deficit disorder)   . Depression   . GERD (gastroesophageal reflux disease)   . Hyperlipemia     "borderline"  . IBS (irritable bowel syndrome)     chronic constipation  . Fibromyalgia   . Osteoporosis   . Bronchitis     hx of  . Hepatitis     B  . Cataract of left eye     since birth  . Osteoarthritis     Past Surgical History  Procedure Laterality Date  . Cholecystectomy    . Cesarean section      x4  . Tonsillectomy    . Total knee arthroplasty Right 2009    OLIN  . Toe amputation  2007  . Ventral hernia repair  2007    "with human screen"  . Carpal tunnel release Bilateral   . Bladder suspension  2001  . Abdominal hysterectomy  2001  . Upper gi endoscopy    . Nose surgery Left 2007    "  benign tumor coming from left nostril"  . Breast surgery      several tumors removed    No prescriptions prior to admission   Allergies  Allergen Reactions  . Cefuroxime Axetil Anaphylaxis  . Metaxalone     unknown  . Simvastatin Other (See Comments)    Severe pain everywhere, reaction to all statins  . Tetracycline Nausea And Vomiting  . Erythromycin Nausea And Vomiting  . Sulfonamide Derivatives Hives    unknown    History  Substance Use Topics  . Smoking status: Former Smoker -- 0.25 packs/day for 20 years    Types: Cigarettes    Quit date: 01/13/1988  . Smokeless tobacco: Never Used  . Alcohol Use: Yes      Comment: occasional    Family History  Problem Relation Age of Onset  . Prostate cancer Paternal Grandfather   . Lung cancer Paternal Grandfather   . Arthritis Mother   . Heart disease Mother     atrial fib  . Cancer Maternal Uncle     prostate  . Hypertension Maternal Grandmother   . Cancer Maternal Grandfather     prostate. lung  . Hypertension Paternal Grandmother      Review of Systems  Constitutional: Positive for malaise/fatigue.  HENT: Negative.   Eyes: Negative.   Respiratory: Negative.   Cardiovascular: Negative.   Gastrointestinal: Negative.   Genitourinary: Negative.   Musculoskeletal: Positive for back pain, joint pain and myalgias.  Skin: Negative.   Neurological: Negative.   Endo/Heme/Allergies: Negative.   Psychiatric/Behavioral: Negative.     Objective:  Physical Exam  Constitutional: She is oriented to person, place, and time. She appears well-developed and well-nourished.  HENT:  Head: Normocephalic and atraumatic.  Mouth/Throat: Oropharynx is clear and moist.  Eyes: Pupils are equal, round, and reactive to light.  Neck: Neck supple. No JVD present. No tracheal deviation present. No thyromegaly present.  Cardiovascular: Normal rate, regular rhythm and intact distal pulses.   Respiratory: Effort normal. No stridor. No respiratory distress. She has no wheezes.  GI: Soft. There is no tenderness. There is no guarding.  Musculoskeletal:       Left knee: She exhibits decreased range of motion, swelling and bony tenderness. She exhibits no effusion, no ecchymosis, no deformity, no laceration, no erythema and normal alignment. Tenderness found.  Lymphadenopathy:    She has no cervical adenopathy.  Neurological: She is alert and oriented to person, place, and time.  Skin: Skin is warm and dry.  Psychiatric: She has a normal mood and affect.   Labs:  Estimated body mass index is 31.76 kg/(m^2) as calculated from the following:   Height as of 10/09/11: 4'  7.75" (1.416 m).   Weight as of 05/24/12: 63.685 kg (140 lb 6.4 oz).   Imaging Review Plain radiographs demonstrate moderate degenerative joint disease of the left knee(s). The overall alignment is  neutral. The bone quality appears to be good for age and reported activity level.  Assessment/Plan:  End stage arthritis, left knee   The patient history, physical examination, clinical judgment of the provider and imaging studies are consistent with end stage degenerative joint disease of the left knee(s) and total knee arthroplasty is deemed medically necessary. The treatment options including medical management, injection therapy arthroscopy and arthroplasty were discussed at length. The risks and benefits of total knee arthroplasty were presented and reviewed. The risks due to aseptic loosening, infection, stiffness, patella tracking problems, thromboembolic complications and other imponderables were discussed. The patient  acknowledged the explanation, agreed to proceed with the plan and consent was signed. Patient is being admitted for inpatient treatment for surgery, pain control, PT, OT, prophylactic antibiotics, VTE prophylaxis, progressive ambulation and ADL's and discharge planning. The patient is planning to be discharged home with home health services.      Anastasio Auerbach Milany Geck   PAC  12/21/2012, 5:21 PM

## 2012-12-25 NOTE — Anesthesia Preprocedure Evaluation (Addendum)
Anesthesia Evaluation  Patient identified by MRN, date of birth, ID band Patient awake    Reviewed: Allergy & Precautions, H&P , NPO status , Patient's Chart, lab work & pertinent test results  Airway Mallampati: II TM Distance: >3 FB Neck ROM: full    Dental no notable dental hx. (+) Teeth Intact and Dental Advisory Given   Pulmonary sleep apnea , former smoker,  breath sounds clear to auscultation  Pulmonary exam normal       Cardiovascular Exercise Tolerance: Good negative cardio ROS  Rhythm:regular Rate:Normal     Neuro/Psych negative neurological ROS  negative psych ROS   GI/Hepatic negative GI ROS, GERD-  Medicated and Controlled,(+) Hepatitis -, B  Endo/Other  negative endocrine ROS  Renal/GU negative Renal ROS  negative genitourinary   Musculoskeletal  (+) Arthritis -, Osteoarthritis,  Fibromyalgia -  Abdominal   Peds  Hematology negative hematology ROS (+)   Anesthesia Other Findings   Reproductive/Obstetrics negative OB ROS                         Anesthesia Physical Anesthesia Plan  ASA: III  Anesthesia Plan: Spinal   Post-op Pain Management:    Induction:   Airway Management Planned: Nasal Cannula  Additional Equipment:   Intra-op Plan:   Post-operative Plan:   Informed Consent: I have reviewed the patients History and Physical, chart, labs and discussed the procedure including the risks, benefits and alternatives for the proposed anesthesia with the patient or authorized representative who has indicated his/her understanding and acceptance.   Dental Advisory Given  Plan Discussed with: CRNA and Surgeon  Anesthesia Plan Comments:         Anesthesia Quick Evaluation

## 2012-12-26 ENCOUNTER — Encounter (HOSPITAL_COMMUNITY): Payer: 59 | Admitting: Anesthesiology

## 2012-12-26 ENCOUNTER — Encounter (HOSPITAL_COMMUNITY)
Admission: RE | Disposition: A | Discharge status: Home Health | Payer: Self-pay | Source: Ambulatory Visit | Attending: Orthopedic Surgery

## 2012-12-26 ENCOUNTER — Inpatient Hospital Stay (HOSPITAL_COMMUNITY)
Admission: RE | Admit: 2012-12-26 | Discharge: 2012-12-27 | DRG: 470 | Disposition: A | Discharge status: Home Health | Payer: 59 | Source: Ambulatory Visit | Attending: Orthopedic Surgery | Admitting: Orthopedic Surgery

## 2012-12-26 ENCOUNTER — Inpatient Hospital Stay (HOSPITAL_COMMUNITY): Payer: 59 | Admitting: Anesthesiology

## 2012-12-26 ENCOUNTER — Encounter: Payer: Self-pay | Admitting: Family Medicine

## 2012-12-26 ENCOUNTER — Encounter (HOSPITAL_COMMUNITY): Payer: Self-pay | Admitting: *Deleted

## 2012-12-26 DIAGNOSIS — Z6832 Body mass index (BMI) 32.0-32.9, adult: Secondary | ICD-10-CM

## 2012-12-26 DIAGNOSIS — D500 Iron deficiency anemia secondary to blood loss (chronic): Secondary | ICD-10-CM | POA: Diagnosis not present

## 2012-12-26 DIAGNOSIS — Z801 Family history of malignant neoplasm of trachea, bronchus and lung: Secondary | ICD-10-CM

## 2012-12-26 DIAGNOSIS — J45909 Unspecified asthma, uncomplicated: Secondary | ICD-10-CM | POA: Diagnosis present

## 2012-12-26 DIAGNOSIS — M069 Rheumatoid arthritis, unspecified: Secondary | ICD-10-CM | POA: Diagnosis present

## 2012-12-26 DIAGNOSIS — Z8619 Personal history of other infectious and parasitic diseases: Secondary | ICD-10-CM

## 2012-12-26 DIAGNOSIS — M810 Age-related osteoporosis without current pathological fracture: Secondary | ICD-10-CM | POA: Diagnosis present

## 2012-12-26 DIAGNOSIS — M1710 Unilateral primary osteoarthritis, unspecified knee: Principal | ICD-10-CM | POA: Diagnosis present

## 2012-12-26 DIAGNOSIS — G4730 Sleep apnea, unspecified: Secondary | ICD-10-CM | POA: Diagnosis present

## 2012-12-26 DIAGNOSIS — Z8249 Family history of ischemic heart disease and other diseases of the circulatory system: Secondary | ICD-10-CM

## 2012-12-26 DIAGNOSIS — IMO0001 Reserved for inherently not codable concepts without codable children: Secondary | ICD-10-CM | POA: Diagnosis present

## 2012-12-26 DIAGNOSIS — F3289 Other specified depressive episodes: Secondary | ICD-10-CM | POA: Diagnosis present

## 2012-12-26 DIAGNOSIS — Z96659 Presence of unspecified artificial knee joint: Secondary | ICD-10-CM

## 2012-12-26 DIAGNOSIS — D62 Acute posthemorrhagic anemia: Secondary | ICD-10-CM | POA: Diagnosis not present

## 2012-12-26 DIAGNOSIS — Z96652 Presence of left artificial knee joint: Secondary | ICD-10-CM

## 2012-12-26 DIAGNOSIS — K219 Gastro-esophageal reflux disease without esophagitis: Secondary | ICD-10-CM | POA: Diagnosis present

## 2012-12-26 DIAGNOSIS — E669 Obesity, unspecified: Secondary | ICD-10-CM | POA: Diagnosis present

## 2012-12-26 DIAGNOSIS — M797 Fibromyalgia: Secondary | ICD-10-CM

## 2012-12-26 DIAGNOSIS — E785 Hyperlipidemia, unspecified: Secondary | ICD-10-CM | POA: Diagnosis present

## 2012-12-26 DIAGNOSIS — F329 Major depressive disorder, single episode, unspecified: Secondary | ICD-10-CM | POA: Diagnosis present

## 2012-12-26 DIAGNOSIS — Z87891 Personal history of nicotine dependence: Secondary | ICD-10-CM

## 2012-12-26 DIAGNOSIS — F988 Other specified behavioral and emotional disorders with onset usually occurring in childhood and adolescence: Secondary | ICD-10-CM | POA: Diagnosis present

## 2012-12-26 HISTORY — PX: TOTAL KNEE ARTHROPLASTY: SHX125

## 2012-12-26 LAB — ABO/RH: ABO/RH(D): O POS

## 2012-12-26 SURGERY — ARTHROPLASTY, KNEE, TOTAL
Anesthesia: Spinal | Site: Knee | Laterality: Left

## 2012-12-26 MED ORDER — ONDANSETRON HCL 4 MG/2ML IJ SOLN
INTRAMUSCULAR | Status: DC | PRN
  Administered 2012-12-26: 4 mg via INTRAVENOUS

## 2012-12-26 MED ORDER — METOCLOPRAMIDE HCL 5 MG/ML IJ SOLN: 5.0000 mg | Freq: Three times a day (TID) | INTRAMUSCULAR | Status: DC | PRN

## 2012-12-26 MED ORDER — CELECOXIB 200 MG PO CAPS
200.0000 mg | ORAL_CAPSULE | Freq: Two times a day (BID) | ORAL | Status: DC
  Administered 2012-12-26 – 2012-12-27 (×3): 200 mg via ORAL
  Filled 2012-12-26 (×4): qty 1

## 2012-12-26 MED ORDER — METHOCARBAMOL 500 MG PO TABS
500.0000 mg | ORAL_TABLET | Freq: Four times a day (QID) | ORAL | Status: DC | PRN
  Administered 2012-12-27: 500 mg via ORAL
  Filled 2012-12-26: qty 1

## 2012-12-26 MED ORDER — FLEET ENEMA 7-19 GM/118ML RE ENEM: 1.0000 | ENEMA | Freq: Once | RECTAL | Status: AC | PRN

## 2012-12-26 MED ORDER — BUPIVACAINE IN DEXTROSE 0.75-8.25 % IT SOLN
INTRATHECAL | Status: DC | PRN
  Administered 2012-12-26: 1.6 mL via INTRATHECAL

## 2012-12-26 MED ORDER — PHENYLEPHRINE HCL 10 MG/ML IJ SOLN
INTRAMUSCULAR | Status: AC
  Filled 2012-12-26: qty 1

## 2012-12-26 MED ORDER — ONDANSETRON HCL 4 MG/2ML IJ SOLN
INTRAMUSCULAR | Status: AC
  Filled 2012-12-26: qty 2

## 2012-12-26 MED ORDER — HYDROMORPHONE HCL PF 1 MG/ML IJ SOLN: 0.5000 mg | INTRAMUSCULAR | Status: DC | PRN

## 2012-12-26 MED ORDER — METOCLOPRAMIDE HCL 10 MG PO TABS: 5.0000 mg | ORAL_TABLET | Freq: Three times a day (TID) | ORAL | Status: DC | PRN

## 2012-12-26 MED ORDER — DEXAMETHASONE SODIUM PHOSPHATE 10 MG/ML IJ SOLN
10.0000 mg | Freq: Once | INTRAMUSCULAR | Status: DC
  Filled 2012-12-26: qty 1

## 2012-12-26 MED ORDER — PHENYLEPHRINE HCL 10 MG/ML IJ SOLN
INTRAMUSCULAR | Status: DC | PRN
  Administered 2012-12-26: 40 ug via INTRAVENOUS
  Administered 2012-12-26 (×2): 80 ug via INTRAVENOUS

## 2012-12-26 MED ORDER — PROPOFOL 10 MG/ML IV BOLUS
INTRAVENOUS | Status: AC
  Filled 2012-12-26: qty 20

## 2012-12-26 MED ORDER — SUCCINYLCHOLINE CHLORIDE 20 MG/ML IJ SOLN
INTRAMUSCULAR | Status: AC
  Filled 2012-12-26: qty 1

## 2012-12-26 MED ORDER — ALUM & MAG HYDROXIDE-SIMETH 200-200-20 MG/5ML PO SUSP: 30.0000 mL | ORAL | Status: DC | PRN

## 2012-12-26 MED ORDER — LACTATED RINGERS IV SOLN: INTRAVENOUS | Status: DC

## 2012-12-26 MED ORDER — ONDANSETRON HCL 4 MG PO TABS: 4.0000 mg | ORAL_TABLET | Freq: Four times a day (QID) | ORAL | Status: DC | PRN

## 2012-12-26 MED ORDER — SODIUM CHLORIDE 0.9 % IR SOLN
Status: DC | PRN
  Administered 2012-12-26: 1000 mL

## 2012-12-26 MED ORDER — DOCUSATE SODIUM 100 MG PO CAPS
100.0000 mg | ORAL_CAPSULE | Freq: Two times a day (BID) | ORAL | Status: DC
  Administered 2012-12-26 – 2012-12-27 (×2): 100 mg via ORAL

## 2012-12-26 MED ORDER — BUPIVACAINE-EPINEPHRINE (PF) 0.25% -1:200000 IJ SOLN
INTRAMUSCULAR | Status: DC | PRN
  Administered 2012-12-26: 25 mL

## 2012-12-26 MED ORDER — METHOCARBAMOL 100 MG/ML IJ SOLN
500.0000 mg | Freq: Four times a day (QID) | INTRAMUSCULAR | Status: DC | PRN
  Filled 2012-12-26: qty 5

## 2012-12-26 MED ORDER — KETOROLAC TROMETHAMINE 30 MG/ML IJ SOLN
INTRAMUSCULAR | Status: AC
  Filled 2012-12-26: qty 1

## 2012-12-26 MED ORDER — PREGABALIN 75 MG PO CAPS
150.0000 mg | ORAL_CAPSULE | Freq: Two times a day (BID) | ORAL | Status: DC
  Administered 2012-12-26 – 2012-12-27 (×2): 150 mg via ORAL
  Filled 2012-12-26 (×2): qty 2

## 2012-12-26 MED ORDER — ZOLPIDEM TARTRATE 5 MG PO TABS
5.0000 mg | ORAL_TABLET | Freq: Every evening | ORAL | Status: DC | PRN
  Administered 2012-12-26: 5 mg via ORAL
  Filled 2012-12-26: qty 1

## 2012-12-26 MED ORDER — PHENOL 1.4 % MT LIQD: 1.0000 | OROMUCOSAL | Status: DC | PRN

## 2012-12-26 MED ORDER — 0.9 % SODIUM CHLORIDE (POUR BTL) OPTIME
TOPICAL | Status: DC | PRN
  Administered 2012-12-26: 1000 mL

## 2012-12-26 MED ORDER — SODIUM CHLORIDE 0.9 % IV SOLN
10.0000 mg | INTRAVENOUS | Status: DC | PRN
  Administered 2012-12-26: 10 ug/min via INTRAVENOUS

## 2012-12-26 MED ORDER — SODIUM CHLORIDE 0.9 % IJ SOLN
INTRAMUSCULAR | Status: AC
  Filled 2012-12-26: qty 50

## 2012-12-26 MED ORDER — PROPOFOL INFUSION 10 MG/ML OPTIME
INTRAVENOUS | Status: DC | PRN
  Administered 2012-12-26: 40 ug/kg/min via INTRAVENOUS

## 2012-12-26 MED ORDER — PREGABALIN 75 MG PO CAPS: 150.0000 mg | ORAL_CAPSULE | Freq: Two times a day (BID) | ORAL | Status: DC

## 2012-12-26 MED ORDER — BUPIVACAINE-EPINEPHRINE PF 0.25-1:200000 % IJ SOLN
INTRAMUSCULAR | Status: AC
  Filled 2012-12-26: qty 30

## 2012-12-26 MED ORDER — LIDOCAINE HCL (CARDIAC) 20 MG/ML IV SOLN
INTRAVENOUS | Status: AC
  Filled 2012-12-26: qty 5

## 2012-12-26 MED ORDER — CLINDAMYCIN PHOSPHATE 600 MG/50ML IV SOLN
600.0000 mg | Freq: Four times a day (QID) | INTRAVENOUS | Status: AC
  Administered 2012-12-26 (×2): 600 mg via INTRAVENOUS
  Filled 2012-12-26 (×2): qty 50

## 2012-12-26 MED ORDER — ARIPIPRAZOLE 2 MG PO TABS
1.0000 mg | ORAL_TABLET | Freq: Every day | ORAL | Status: DC
  Administered 2012-12-27: 1 mg via ORAL
  Filled 2012-12-26: qty 1

## 2012-12-26 MED ORDER — BUPIVACAINE LIPOSOME 1.3 % IJ SUSP
20.0000 mL | Freq: Once | INTRAMUSCULAR | Status: DC
  Filled 2012-12-26: qty 20

## 2012-12-26 MED ORDER — PHENYLEPHRINE 40 MCG/ML (10ML) SYRINGE FOR IV PUSH (FOR BLOOD PRESSURE SUPPORT)
PREFILLED_SYRINGE | INTRAVENOUS | Status: AC
  Filled 2012-12-26: qty 10

## 2012-12-26 MED ORDER — KETOROLAC TROMETHAMINE 30 MG/ML IJ SOLN
INTRAMUSCULAR | Status: DC | PRN
  Administered 2012-12-26: 30 mg via INTRAVENOUS

## 2012-12-26 MED ORDER — PANTOPRAZOLE SODIUM 40 MG PO TBEC
40.0000 mg | DELAYED_RELEASE_TABLET | Freq: Every day | ORAL | Status: DC
  Filled 2012-12-26: qty 1

## 2012-12-26 MED ORDER — FERROUS SULFATE 325 (65 FE) MG PO TABS
325.0000 mg | ORAL_TABLET | Freq: Three times a day (TID) | ORAL | Status: DC
  Administered 2012-12-26 – 2012-12-27 (×3): 325 mg via ORAL
  Filled 2012-12-26 (×6): qty 1

## 2012-12-26 MED ORDER — CLINDAMYCIN PHOSPHATE 900 MG/50ML IV SOLN
900.0000 mg | INTRAVENOUS | Status: AC
Start: 2012-12-26 — End: 2012-12-26
  Administered 2012-12-26: 900 mg via INTRAVENOUS

## 2012-12-26 MED ORDER — DIPHENHYDRAMINE HCL 25 MG PO CAPS: 25.0000 mg | ORAL_CAPSULE | Freq: Four times a day (QID) | ORAL | Status: DC | PRN

## 2012-12-26 MED ORDER — FENTANYL CITRATE 0.05 MG/ML IJ SOLN
INTRAMUSCULAR | Status: AC
  Filled 2012-12-26: qty 2

## 2012-12-26 MED ORDER — CLINDAMYCIN PHOSPHATE 900 MG/50ML IV SOLN
INTRAVENOUS | Status: AC
  Filled 2012-12-26: qty 50

## 2012-12-26 MED ORDER — ROCURONIUM BROMIDE 100 MG/10ML IV SOLN
INTRAVENOUS | Status: AC
  Filled 2012-12-26: qty 1

## 2012-12-26 MED ORDER — MENTHOL 3 MG MT LOZG: 1.0000 | LOZENGE | OROMUCOSAL | Status: DC | PRN

## 2012-12-26 MED ORDER — SODIUM CHLORIDE 0.9 % IJ SOLN
INTRAMUSCULAR | Status: DC | PRN
  Administered 2012-12-26: 08:00:00

## 2012-12-26 MED ORDER — HYDROCHLOROTHIAZIDE 25 MG PO TABS
25.0000 mg | ORAL_TABLET | Freq: Every day | ORAL | Status: DC
  Administered 2012-12-27: 25 mg via ORAL
  Filled 2012-12-26 (×2): qty 1

## 2012-12-26 MED ORDER — PREGABALIN 150 MG PO CAPS: 300.0000 mg | ORAL_CAPSULE | Freq: Every day | ORAL | Status: DC

## 2012-12-26 MED ORDER — ASPIRIN EC 325 MG PO TBEC
325.0000 mg | DELAYED_RELEASE_TABLET | Freq: Two times a day (BID) | ORAL | Status: DC
  Administered 2012-12-27 (×2): 325 mg via ORAL
  Filled 2012-12-26 (×3): qty 1

## 2012-12-26 MED ORDER — LACTATED RINGERS IV SOLN
INTRAVENOUS | Status: DC | PRN
  Administered 2012-12-26 (×2): via INTRAVENOUS

## 2012-12-26 MED ORDER — KETAMINE HCL 50 MG/ML IJ SOLN
INTRAMUSCULAR | Status: AC
  Filled 2012-12-26: qty 10

## 2012-12-26 MED ORDER — MIDAZOLAM HCL 5 MG/5ML IJ SOLN
INTRAMUSCULAR | Status: DC | PRN
  Administered 2012-12-26: 2 mg via INTRAVENOUS

## 2012-12-26 MED ORDER — POLYETHYLENE GLYCOL 3350 17 G PO PACK: 17.0000 g | PACK | Freq: Two times a day (BID) | ORAL | Status: DC

## 2012-12-26 MED ORDER — FENTANYL CITRATE 0.05 MG/ML IJ SOLN
INTRAMUSCULAR | Status: DC | PRN
  Administered 2012-12-26 (×2): 50 ug via INTRAVENOUS

## 2012-12-26 MED ORDER — MIDAZOLAM HCL 2 MG/2ML IJ SOLN
INTRAMUSCULAR | Status: AC
  Filled 2012-12-26: qty 2

## 2012-12-26 MED ORDER — NIACIN 500 MG PO TABS
500.0000 mg | ORAL_TABLET | Freq: Every day | ORAL | Status: DC
  Administered 2012-12-27: 500 mg via ORAL
  Filled 2012-12-26 (×2): qty 1

## 2012-12-26 MED ORDER — TRAMADOL HCL 50 MG PO TABS: ORAL_TABLET | ORAL | Status: DC

## 2012-12-26 MED ORDER — METHYLPHENIDATE HCL 10 MG PO TABS: 10.0000 mg | ORAL_TABLET | Freq: Two times a day (BID) | ORAL | Status: DC

## 2012-12-26 MED ORDER — TRANEXAMIC ACID 100 MG/ML IV SOLN
1000.0000 mg | Freq: Once | INTRAVENOUS | Status: AC
  Administered 2012-12-26: 1000 mg via INTRAVENOUS
  Filled 2012-12-26: qty 10

## 2012-12-26 MED ORDER — DEXAMETHASONE SODIUM PHOSPHATE 10 MG/ML IJ SOLN
10.0000 mg | Freq: Once | INTRAMUSCULAR | Status: AC
  Administered 2012-12-26: 10 mg via INTRAVENOUS

## 2012-12-26 MED ORDER — SODIUM CHLORIDE 0.9 % IV SOLN
INTRAVENOUS | Status: DC
  Administered 2012-12-26: 12:00:00 via INTRAVENOUS
  Filled 2012-12-26 (×5): qty 1000

## 2012-12-26 MED ORDER — BISACODYL 10 MG RE SUPP: 10.0000 mg | Freq: Every day | RECTAL | Status: DC | PRN

## 2012-12-26 MED ORDER — KETAMINE HCL 50 MG/ML IJ SOLN
INTRAMUSCULAR | Status: DC | PRN
  Administered 2012-12-26: 25 mg via INTRAMUSCULAR

## 2012-12-26 MED ORDER — DIPHENHYDRAMINE HCL 50 MG/ML IJ SOLN
INTRAMUSCULAR | Status: AC
  Filled 2012-12-26: qty 1

## 2012-12-26 MED ORDER — ONDANSETRON HCL 4 MG/2ML IJ SOLN: 4.0000 mg | Freq: Four times a day (QID) | INTRAMUSCULAR | Status: DC | PRN

## 2012-12-26 MED ORDER — HYDROMORPHONE HCL PF 1 MG/ML IJ SOLN: 0.2500 mg | INTRAMUSCULAR | Status: DC | PRN

## 2012-12-26 MED ORDER — CLONAZEPAM 1 MG PO TABS
1.0000 mg | ORAL_TABLET | Freq: Every day | ORAL | Status: DC
  Administered 2012-12-26: 1 mg via ORAL
  Filled 2012-12-26: qty 1

## 2012-12-26 MED ORDER — HYDROCODONE-ACETAMINOPHEN 7.5-325 MG PO TABS
1.0000 | ORAL_TABLET | ORAL | Status: DC
  Administered 2012-12-26 – 2012-12-27 (×8): 2 via ORAL
  Filled 2012-12-26 (×8): qty 2

## 2012-12-26 MED ORDER — DEXAMETHASONE SODIUM PHOSPHATE 10 MG/ML IJ SOLN
INTRAMUSCULAR | Status: AC
  Filled 2012-12-26: qty 1

## 2012-12-26 SURGICAL SUPPLY — 58 items
ADH SKN CLS APL DERMABOND .7 (GAUZE/BANDAGES/DRESSINGS) ×1
BAG SPEC THK2 15X12 ZIP CLS (MISCELLANEOUS) ×1
BAG ZIPLOCK 12X15 (MISCELLANEOUS) ×2 IMPLANT
BANDAGE ELASTIC 6 VELCRO ST LF (GAUZE/BANDAGES/DRESSINGS) ×2 IMPLANT
BANDAGE ESMARK 6X9 LF (GAUZE/BANDAGES/DRESSINGS) ×1 IMPLANT
BLADE SAW SGTL 13.0X1.19X90.0M (BLADE) ×2 IMPLANT
BNDG CMPR 9X6 STRL LF SNTH (GAUZE/BANDAGES/DRESSINGS) ×1
BNDG ESMARK 6X9 LF (GAUZE/BANDAGES/DRESSINGS) ×2
BOWL SMART MIX CTS (DISPOSABLE) ×2 IMPLANT
CAPT RP KNEE ×1 IMPLANT
CATH ROBINSON RED A/P 14FR (CATHETERS) ×1 IMPLANT
CEMENT HV SMART SET (Cement) ×1 IMPLANT
CUFF TOURN SGL QUICK 34 (TOURNIQUET CUFF) ×2
CUFF TRNQT CYL 34X4X40X1 (TOURNIQUET CUFF) ×1 IMPLANT
DECANTER SPIKE VIAL GLASS SM (MISCELLANEOUS) ×2 IMPLANT
DERMABOND ADVANCED (GAUZE/BANDAGES/DRESSINGS) ×1
DERMABOND ADVANCED .7 DNX12 (GAUZE/BANDAGES/DRESSINGS) ×1 IMPLANT
DRAPE EXTREMITY T 121X128X90 (DRAPE) ×2 IMPLANT
DRAPE POUCH INSTRU U-SHP 10X18 (DRAPES) ×2 IMPLANT
DRAPE U-SHAPE 47X51 STRL (DRAPES) ×2 IMPLANT
DRSG AQUACEL AG ADV 3.5X10 (GAUZE/BANDAGES/DRESSINGS) ×2 IMPLANT
DRSG TEGADERM 4X4.75 (GAUZE/BANDAGES/DRESSINGS) IMPLANT
DURAPREP 26ML APPLICATOR (WOUND CARE) ×4 IMPLANT
ELECT REM PT RETURN 9FT ADLT (ELECTROSURGICAL) ×2
ELECTRODE REM PT RTRN 9FT ADLT (ELECTROSURGICAL) ×1 IMPLANT
EVACUATOR 1/8 PVC DRAIN (DRAIN) IMPLANT
FACESHIELD LNG OPTICON STERILE (SAFETY) ×10 IMPLANT
GAUZE SPONGE 2X2 8PLY STRL LF (GAUZE/BANDAGES/DRESSINGS) IMPLANT
GLOVE BIOGEL PI IND STRL 7.5 (GLOVE) ×1 IMPLANT
GLOVE BIOGEL PI IND STRL 8 (GLOVE) ×1 IMPLANT
GLOVE BIOGEL PI INDICATOR 7.5 (GLOVE) ×1
GLOVE BIOGEL PI INDICATOR 8 (GLOVE) ×1
GLOVE ECLIPSE 8.0 STRL XLNG CF (GLOVE) ×2 IMPLANT
GLOVE ORTHO TXT STRL SZ7.5 (GLOVE) ×4 IMPLANT
GOWN BRE IMP PREV XXLGXLNG (GOWN DISPOSABLE) ×2 IMPLANT
GOWN PREVENTION PLUS LG XLONG (DISPOSABLE) ×2 IMPLANT
HANDPIECE INTERPULSE COAX TIP (DISPOSABLE) ×2
KIT BASIN OR (CUSTOM PROCEDURE TRAY) ×2 IMPLANT
MANIFOLD NEPTUNE II (INSTRUMENTS) ×2 IMPLANT
NDL SAFETY ECLIPSE 18X1.5 (NEEDLE) ×1 IMPLANT
NEEDLE HYPO 18GX1.5 SHARP (NEEDLE) ×2
NS IRRIG 1000ML POUR BTL (IV SOLUTION) ×2 IMPLANT
PACK TOTAL JOINT (CUSTOM PROCEDURE TRAY) ×2 IMPLANT
POSITIONER SURGICAL ARM (MISCELLANEOUS) ×2 IMPLANT
SET HNDPC FAN SPRY TIP SCT (DISPOSABLE) ×1 IMPLANT
SET PAD KNEE POSITIONER (MISCELLANEOUS) ×2 IMPLANT
SPONGE GAUZE 2X2 STER 10/PKG (GAUZE/BANDAGES/DRESSINGS)
SUCTION FRAZIER 12FR DISP (SUCTIONS) ×2 IMPLANT
SUT MNCRL AB 4-0 PS2 18 (SUTURE) ×2 IMPLANT
SUT VIC AB 1 CT1 36 (SUTURE) ×2 IMPLANT
SUT VIC AB 2-0 CT1 27 (SUTURE) ×6
SUT VIC AB 2-0 CT1 TAPERPNT 27 (SUTURE) ×3 IMPLANT
SUT VLOC 180 0 24IN GS25 (SUTURE) ×2 IMPLANT
SYR 50ML LL SCALE MARK (SYRINGE) ×2 IMPLANT
TOWEL OR 17X26 10 PK STRL BLUE (TOWEL DISPOSABLE) ×4 IMPLANT
TRAY FOLEY CATH 14FRSI W/METER (CATHETERS) IMPLANT
WATER STERILE IRR 1500ML POUR (IV SOLUTION) ×2 IMPLANT
WRAP KNEE MAXI GEL POST OP (GAUZE/BANDAGES/DRESSINGS) ×2 IMPLANT

## 2012-12-26 NOTE — Op Note (Signed)
NAME:  [name]                      MEDICAL RECORD NO.:  [id number]                             FACILITY:  [facility]      PHYSICIAN:  Madlyn Frankel. Charlann Boxer, M.D.  DATE OF BIRTH:  Oct 27, 1950      DATE OF PROCEDURE:  12/26/2012                                     OPERATIVE REPORT         PREOPERATIVE DIAGNOSIS:  Left knee osteoarthritis.      POSTOPERATIVE DIAGNOSIS:  Left knee osteoarthritis.      FINDINGS:  The patient was noted to have complete loss of cartilage and   bone-on-bone arthritis with associated osteophytes in the medial and patellofemoral compartments of   the knee with a significant synovitis and associated effusion.      PROCEDURE:  Left total knee replacement.      COMPONENTS USED:  DePuy rotating platform posterior stabilized knee   system, a size 1.5 femur, 1.5 tibia, 12.5 mm insert, and 35 patellar   button.      SURGEON:  Madlyn Frankel. Charlann Boxer, M.D.      ASSISTANT:  Lanney Gins, PA-C.      ANESTHESIA:  Spinal.      SPECIMENS:  None.      COMPLICATION:  None.      DRAINS:  None.  EBL: <100cc      TOURNIQUET TIME:   Total Tourniquet Time Documented: Thigh (Left) - 32 minutes Total: Thigh (Left) - 32 minutes  .      The patient was stable to the recovery room.      INDICATION FOR PROCEDURE:  [name] is a 62 y.o. female patient of   mine.  The patient had been seen, evaluated, and treated conservatively in the   office with medication, activity modification, and injections.  The patient had   radiographic changes of bone-on-bone arthritis with endplate sclerosis and osteophytes noted.      The patient failed conservative measures including medication, injections, and activity modification, and at this point was ready for more definitive measures.   Based on the radiographic changes and failed conservative measures, the patient   decided to proceed with total knee replacement.  Risks of infection,   DVT, component failure, need for revision  surgery, postop course, and   expectations were all   discussed and reviewed.  Consent was obtained for benefit of pain   relief.      PROCEDURE IN DETAIL:  The patient was brought to the operative theater.   Once adequate anesthesia, preoperative antibiotics, 900mg  of Cleocin administered, the patient was positioned supine with the left thigh tourniquet placed.  The  left lower extremity was prepped and draped in sterile fashion.  A time-   out was performed identifying the patient, planned procedure, and   extremity.      The left lower extremity was placed in the [facility] leg holder.  The leg was   exsanguinated, tourniquet elevated to 250 mmHg.  A midline incision was   made followed by median parapatellar arthrotomy.  Following initial   exposure, attention was first directed to  the patella.  Precut   measurement was noted to be 21 mm.  I resected down to 14 mm and used a   35 patellar button to restore patellar height as well as cover the cut   surface.      The lug holes were drilled and a metal shim was placed to protect the   patella from retractors and saw blades.      At this point, attention was now directed to the femur.  The femoral   canal was opened with a drill, irrigated to try to prevent fat emboli.  An   intramedullary rod was passed at 3 degrees valgus, 9 mm of bone was   resected off the distal femur.  Following this resection, the tibia was   subluxated anteriorly.  Using the extramedullary guide, 10 mm of bone was resected off   the proximal lateral tibia.  We confirmed the gap would be   stable medially and laterally with a 10 mm insert as well as confirmed   the cut was perpendicular in the coronal plane, checking with an alignment rod.      Once this was done, I sized the femur to be a size 1.5 in the anterior-   posterior dimension, chose a standard component based on medial and   lateral dimension.  The size 1.5 rotation block was then pinned in   position  anterior referenced using the C-clamp to set rotation.  The   anterior, posterior, and  chamfer cuts were made without difficulty nor   notching making certain that I was along the anterior cortex to help   with flexion gap stability.      The final box cut was made off the lateral aspect of distal femur.      At this point, the tibia was sized to be a size 1.5, the size 1.5 tray was   then pinned in position through the medial third of the tubercle,   drilled, and keel punched.  Trial reduction was now carried with a 1.5 femur,  1.5 tibia, a 12.5 mm insert, and the 35 patella botton.  The knee was brought to   extension, full extension with good flexion stability with the patella   tracking through the trochlea without application of pressure.  Given   all these findings, the trial components removed.  Final components were   opened and cement was mixed.  The knee was irrigated with normal saline   solution and pulse lavage.  The synovial lining was   then injected with 20cc of Exparel,  0.25% Marcaine with epinephrine and 1 cc of Toradol,   total of 61 cc.      The knee was irrigated.  Final implants were then cemented onto clean and   dried cut surfaces of bone with the knee brought to extension with a 12.5 mm trial insert.      Once the cement had fully cured, the excess cement was removed   throughout the knee.  I confirmed I was satisfied with the range of   motion and stability, and the final 12.5 mm PS insert was chosen.  It was   placed into the knee.      The tourniquet had been let down at 32 minutes.  No significant   hemostasis required.  The   extensor mechanism was then reapproximated using #1 Vicryl and #0 V-lock sutures with the knee   in flexion.  The remaining wound was closed  with 2-0 Vicryl and running 4-0 Monocryl.   The knee was cleaned, dried, dressed sterilely using Dermabond and   Aquacel dressing.  Drain site dressed separately.  The patient was then    brought to recovery room in stable condition, tolerating the procedure   well.   Please note that Physician Assistant, Lanney Gins, was present for the entirety of the case, and was utilized for pre-operative positioning, peri-operative retractor management, general facilitation of the procedure.  He was also utilized for primary wound closure at the end of the case.              Madlyn Frankel Charlann Boxer, M.D.    12/26/2012 8:56 AM

## 2012-12-26 NOTE — Anesthesia Postprocedure Evaluation (Signed)
  Anesthesia Post-op Note  Patient: [name]  Procedure(s) Performed: Procedure(s) (LRB): LEFT TOTAL KNEE ARTHROPLASTY (Left)  Patient Location: PACU  Anesthesia Type: Spinal  Level of Consciousness: awake and alert   Airway and Oxygen Therapy: Patient Spontanous Breathing  Post-op Pain: mild  Post-op Assessment: Post-op Vital signs reviewed, Patient's Cardiovascular Status Stable, Respiratory Function Stable, Patent Airway and No signs of Nausea or vomiting  Last Vitals:  Filed Vitals:   12/26/12 0945  BP: 87/44  Pulse: 70  Temp:   Resp: 12    Post-op Vital Signs: stable   Complications: No apparent anesthesia complications

## 2012-12-26 NOTE — Evaluation (Signed)
Physical Therapy Evaluation Patient Details Name: [name] MRN: [id number] DOB: [birth year] Today's Date: 12/26/2012 Time: 0272-5366 PT Time Calculation (min): 22 min  PT Assessment / Plan / Recommendation History of Present Illness     Clinical Impression  Pt is s/p L TKA resulting in the deficits listed below (see PT Problem List).  Pt will benefit from skilled PT to increase their independence and safety with mobility to allow discharge home with spouse.    PT Assessment  Patient needs continued PT services    Follow Up Recommendations  Home health PT    Does the patient have the potential to tolerate intense rehabilitation      Barriers to Discharge        Equipment Recommendations  None recommended by PT    Recommendations for Other Services     Frequency 7X/week    Precautions / Restrictions Precautions Precautions: Knee Restrictions Other Position/Activity Restrictions: WBAT   Pertinent Vitals/Pain Pt states no pain at rest, premedicated, ice packs applied to knee end of session      Mobility  Bed Mobility Bed Mobility: Right Sidelying to Sit;Sit to Sidelying Right Right Sidelying to Sit: 5: Supervision Sit to Sidelying Right: 4: Min assist Details for Bed Mobility Assistance: pt laying on side upon entering room, discussed keeping L knee straight esp during sidelying, assist for L LE onto bed Transfers Transfers: Sit to Stand;Stand to Sit;Stand Pivot Transfers Sit to Stand: 4: Min guard;With upper extremity assist;From bed;From chair/3-in-1 Stand to Sit: 4: Min guard;With upper extremity assist;To chair/3-in-1 Stand Pivot Transfers: 4: Min guard Details for Transfer Assistance: verbal cues for safe technique, assisted to [facility] prior to ambulation Ambulation/Gait Ambulation/Gait Assistance: 4: Min guard Ambulation Distance (Feet): 60 Feet Assistive device: Rolling walker Ambulation/Gait Assistance Details: verbal cues for sequence and safe RW  distance Gait Pattern: Step-to pattern;Step-through pattern;Decreased stride length;Trunk flexed    Exercises     PT Diagnosis: Difficulty walking;Acute pain  PT Problem List: Decreased strength;Decreased range of motion;Decreased mobility;Decreased knowledge of precautions;Decreased knowledge of use of DME;Pain PT Treatment Interventions: Functional mobility training;Stair training;Gait training;DME instruction;Patient/family education;Therapeutic activities;Therapeutic exercise     PT Goals(Current goals can be found in the care plan section) Acute Rehab PT Goals PT Goal Formulation: With patient Time For Goal Achievement: 01/02/13 Potential to Achieve Goals: Good  Visit Information  Last PT Received On: 12/26/12 Assistance Needed: +1       Prior Functioning  Home Living Family/patient expects to be discharged to:: Private residence Living Arrangements: Spouse/significant other Available Help at Discharge: Family Type of Home: House Home Access: Stairs to enter Secretary/administrator of Steps: 2 Entrance Stairs-Rails: Left;Right Home Layout: One level Home Equipment: Environmental consultant - 2 wheels;Other (comment) (?BSC) Prior Function Level of Independence: Independent Communication Communication: No difficulties    Cognition  Cognition Arousal/Alertness: Awake/alert Behavior During Therapy: WFL for tasks assessed/performed Overall Cognitive Status: Within Functional Limits for tasks assessed    Extremity/Trunk Assessment Lower Extremity Assessment Lower Extremity Assessment: LLE deficits/detail LLE Deficits / Details: good quad contraction, observed functionally -10-70* AROM   Balance    End of Session PT - End of Session Activity Tolerance: Patient tolerated treatment well Patient left: in bed;with call bell/phone within reach;with family/visitor present  GP     Charlii Yost,KATHrine E 12/26/2012, 4:30 PM Zenovia Jarred, PT, DPT 12/26/2012 Pager: [phone]

## 2012-12-26 NOTE — Interval H&P Note (Signed)
History and Physical Interval Note:  12/26/2012 7:19 AM  [name]  has presented today for surgery, with the diagnosis of OSTEOARTHRITIS LEFT KNEE  The various methods of treatment have been discussed with the patient and family. After consideration of risks, benefits and other options for treatment, the patient has consented to  Procedure(s): LEFT TOTAL KNEE ARTHROPLASTY (Left) as a surgical intervention .  The patient's history has been reviewed, patient examined, no change in status, stable for surgery.  I have reviewed the patient's chart and labs.  Questions were answered to the patient's satisfaction.     Shelda Pal

## 2012-12-26 NOTE — Anesthesia Procedure Notes (Signed)
Spinal  Patient location during procedure: OR Start time: 12/26/2012 7:28 AM End time: 12/26/2012 7:30 AM Staffing CRNA/Resident: Carmelia Roller R Performed by: resident/CRNA  Preanesthetic Checklist Completed: patient identified, site marked, surgical consent, pre-op evaluation, timeout performed, IV checked, risks and benefits discussed and monitors and equipment checked Spinal Block Patient position: sitting Prep: Betadine Patient monitoring: heart rate, continuous pulse ox and blood pressure Approach: midline Location: L3-4 Injection technique: single-shot Needle Needle type: Whitacre  Needle length: 9 cm Needle insertion depth: 7 cm Assessment Sensory level: T6

## 2012-12-26 NOTE — Transfer of Care (Signed)
Immediate Anesthesia Transfer of Care Note  Patient: [name]  Procedure(s) Performed: Procedure(s): LEFT TOTAL KNEE ARTHROPLASTY (Left)  Patient Location: PACU  Anesthesia Type:Spinal  Level of Consciousness: sedated  Airway & Oxygen Therapy: Patient Spontanous Breathing and Patient connected to face mask oxygen  Post-op Assessment: Report given to PACU RN and Post -op Vital signs reviewed and stable  Post vital signs: Reviewed and stable  Complications: No apparent anesthesia complications

## 2012-12-26 NOTE — Plan of Care (Signed)
Problem: Consults Goal: Diagnosis- Total Joint Replacement Left total knee     

## 2012-12-26 NOTE — Preoperative (Signed)
Beta Blockers   Reason not to administer Beta Blockers:Not Applicable 

## 2012-12-26 NOTE — Telephone Encounter (Signed)
Last seen 11/25/12 and filled Lyrica filled 10/10/12 #180 and Tramadol filled 11/03/12 #120. Please advise       KP

## 2012-12-27 DIAGNOSIS — D500 Iron deficiency anemia secondary to blood loss (chronic): Secondary | ICD-10-CM | POA: Diagnosis not present

## 2012-12-27 DIAGNOSIS — E669 Obesity, unspecified: Secondary | ICD-10-CM | POA: Diagnosis present

## 2012-12-27 LAB — CBC
Platelets: 312 10*3/uL (ref 150–400)
RDW: 13.6 % (ref 11.5–15.5)
WBC: 14.3 10*3/uL — ABNORMAL HIGH (ref 4.0–10.5)

## 2012-12-27 LAB — BASIC METABOLIC PANEL
CO2: 23 mEq/L (ref 19–32)
Chloride: 106 mEq/L (ref 96–112)
Creatinine, Ser: 0.78 mg/dL (ref 0.50–1.10)
GFR calc Af Amer: >90 mL/min (ref >90)
Glucose, Bld: 122 mg/dL — ABNORMAL HIGH (ref 70–99)
Potassium: 4.2 mEq/L (ref 3.5–5.1)
Sodium: 137 mEq/L (ref 135–145)

## 2012-12-27 MED ORDER — FERROUS SULFATE 325 (65 FE) MG PO TABS: 325.0000 mg | ORAL_TABLET | Freq: Three times a day (TID) | ORAL | Status: DC

## 2012-12-27 MED ORDER — ASPIRIN 325 MG PO TBEC: 325.0000 mg | DELAYED_RELEASE_TABLET | Freq: Two times a day (BID) | ORAL | Status: AC

## 2012-12-27 MED ORDER — POLYETHYLENE GLYCOL 3350 17 G PO PACK: 17.0000 g | PACK | Freq: Two times a day (BID) | ORAL | Status: DC

## 2012-12-27 MED ORDER — DSS 100 MG PO CAPS: 100.0000 mg | ORAL_CAPSULE | Freq: Two times a day (BID) | ORAL | Status: DC

## 2012-12-27 MED ORDER — HYDROCODONE-ACETAMINOPHEN 7.5-325 MG PO TABS: 1.0000 | ORAL_TABLET | ORAL | Status: DC | PRN

## 2012-12-27 MED ORDER — EZETIMIBE 10 MG PO TABS: 10.0000 mg | ORAL_TABLET | Freq: Every day | ORAL | Status: DC

## 2012-12-27 MED ORDER — CYCLOBENZAPRINE HCL 10 MG PO TABS: 10.0000 mg | ORAL_TABLET | Freq: Two times a day (BID) | ORAL | Status: DC | PRN

## 2012-12-27 NOTE — Addendum Note (Signed)
Addended by: Arnette Norris on: 12/27/2012 08:43 AM   Modules accepted: Orders

## 2012-12-27 NOTE — Progress Notes (Addendum)
Physical Therapy Treatment Patient Details Name: [name] MRN: [id number] DOB: Nov 12, 1950 Today's Date: 12/27/2012 Time: 5284-1324 PT Time Calculation (min): 25 min  PT Assessment / Plan / Recommendation  History of Present Illness LEFT TOTAL KNEE ARTHROPLASTY (Left)    PT Comments   Pt mobilizing very well POD#1.  Pt ambulated in hallway, practiced stairs, and performed exercises.  Pt plans to d/c home later today when spouse available after second PT session.  Follow Up Recommendations  Home health PT     Does the patient have the potential to tolerate intense rehabilitation     Barriers to Discharge        Equipment Recommendations  None recommended by PT    Recommendations for Other Services    Frequency 7X/week   Progress towards PT Goals Progress towards PT goals: Progressing toward goals  Plan Current plan remains appropriate    Precautions / Restrictions Precautions Precautions: Knee Restrictions Weight Bearing Restrictions: No Other Position/Activity Restrictions: WBAT   Pertinent Vitals/Pain 4/10 L knee pain with mobility, repositioned, premedicated    Mobility  Bed Mobility Bed Mobility: Right Sidelying to Sit;Sit to Sidelying Right Right Sidelying to Sit: 5: Supervision;HOB flat Supine to Sit: 5: Supervision;HOB elevated Sit to Sidelying Right: 5: Set up;HOB flat Details for Bed Mobility Assistance: pt laying on side upon entering room, discussed again keeping L knee straight esp during sidelying and no pillows under knee at rest as pt reports various sleeping positions last night, self assist using sheet for L LE onto bed Transfers Transfers: Sit to Stand;Stand to Sit;Stand Pivot Transfers Sit to Stand: With upper extremity assist;From bed;5: Supervision Stand to Sit: With upper extremity assist;5: Supervision;To bed Stand Pivot Transfers: 4: Min guard Details for Transfer Assistance: verbal cues for safe  technique Ambulation/Gait Ambulation/Gait Assistance: 5: Supervision;4: Min guard Ambulation Distance (Feet): 160 Feet Assistive device: Rolling walker Ambulation/Gait Assistance Details: verbal cues for heel strike and safe RW distance Gait Pattern: Step-through pattern;Decreased stride length;Trunk flexed Stairs: Yes Stairs Assistance Details (indicate cue type and reason): pt reports she will perform steps with rail and assist from spouse so provided 1 HHA, pt did not wish to practice using RW, verbal cues for safety and sequence Stair Management Technique: One rail Right;Step to pattern;Forwards Number of Stairs: 2    Exercises Total Joint Exercises Ankle Circles/Pumps: AROM;Both;20 reps;Supine Quad Sets: AROM;Both;20 reps;Supine Short Arc Quad: AROM;Left;15 reps;Supine Heel Slides: AAROM;Left;20 reps;Seated Hip ABduction/ADduction: AROM;Left;15 reps;Supine Straight Leg Raises: AROM;Left;10 reps;Supine Goniometric ROM: L knee AAROM -10-70*   PT Diagnosis:    PT Problem List:   PT Treatment Interventions:     PT Goals (current goals can now be found in the care plan section) Acute Rehab PT Goals Patient Stated Goal: home  Visit Information  Last PT Received On: 12/27/12 Assistance Needed: +1 History of Present Illness: LEFT TOTAL KNEE ARTHROPLASTY (Left)     Subjective Data  Patient Stated Goal: home   Cognition  Cognition Arousal/Alertness: Awake/alert Behavior During Therapy: WFL for tasks assessed/performed Overall Cognitive Status: Within Functional Limits for tasks assessed    Balance  Balance Balance Assessed: Yes Dynamic Standing Balance Dynamic Standing - Level of Assistance: 5: Stand by assistance  End of Session PT - End of Session Activity Tolerance: Patient tolerated treatment well Patient left: in bed;with call bell/phone within reach   GP     Swayze Pries,KATHrine E 12/27/2012, 12:49 PM Zenovia Jarred, PT, DPT 12/27/2012 Pager: [phone]

## 2012-12-27 NOTE — Care Management Note (Signed)
    Page 1 of 2   12/27/2012     1:06:26 PM   CARE MANAGEMENT NOTE 12/27/2012  Patient:  [name], [name]   Account Number:  [id number]  Date Initiated:  12/27/2012  Documentation initiated by:  Colleen Can  Subjective/Objective Assessment:   DX osteoarthritis left knee; total kne replacemnt     Action/Plan:   CM spoke with patienjt. Plans are for patient to return to her home in [facility] where spouse, son and neighbor will be caregivers. She already has RW but needs 3n1 commode.   Anticipated DC Date:  12/27/2012   Anticipated DC Plan:  HOME W HOME HEALTH SERVICES      DC Planning Services  CM consult      PAC Choice  DURABLE MEDICAL EQUIPMENT  HOME HEALTH   Choice offered to / List presented to:  C-1 Patient   DME arranged  3-N-1      DME agency  [facility]     HH arranged  HH-2 PT      [facility] agency  [facility]   Status of service:  Completed, signed off Medicare Important Message given?   (If response is "NO", the following Medicare IM given date fields will be blank) Date Medicare IM given:   Date Additional Medicare IM given:    Discharge Disposition:  HOME W HOME HEALTH SERVICES  Per UR Regulation:  Reviewed for med. necessity/level of care/duration of stay  If discussed at Long Length of Stay Meetings, dates discussed:    Comments:  12/27/2012 Colleen Can BSN RN CCM [phone] [facility] rep called to request services. Advised that they would be able to service patient with home PT. DME rep notified regarding DME need-commode seat.

## 2012-12-27 NOTE — Evaluation (Signed)
Occupational Therapy Evaluation Patient Details Name: [name] MRN: [id number] DOB: 20-Sep-1950 Today's Date: 12/27/2012 Time: 4098-1191 OT Time Calculation (min): 21 min  OT Assessment / Plan / Recommendation History of present illness LEFT TOTAL KNEE ARTHROPLASTY (Left)    Clinical Impression   All education completed and pt has assist available at home. No further OT needs.    OT Assessment  Patient does not need any further OT services    Follow Up Recommendations  No OT follow up;Supervision/Assistance - 24 hour    Barriers to Discharge      Equipment Recommendations  3 in 1 bedside comode    Recommendations for Other Services    Frequency       Precautions / Restrictions Precautions Precautions: Knee Restrictions Weight Bearing Restrictions: No Other Position/Activity Restrictions: WBAT   Pertinent Vitals/Pain Pt states pain is ok.    ADL  Eating/Feeding: Simulated;Independent Where Assessed - Eating/Feeding: Chair Grooming: Performed;Wash/dry hands;Min guard Where Assessed - Grooming: Unsupported standing Upper Body Bathing: Simulated;Chest;Right arm;Left arm;Abdomen;Set up Where Assessed - Upper Body Bathing: Unsupported sitting Lower Body Bathing: Simulated;Minimal assistance Where Assessed - Lower Body Bathing: Supported sit to stand Upper Body Dressing: Simulated;Set up Where Assessed - Upper Body Dressing: Unsupported sitting Lower Body Dressing: Simulated;Minimal assistance Where Assessed - Lower Body Dressing: Supported sit to stand Toilet Transfer: Performed;Min guard Acupuncturist: Raised toilet seat with arms (or 3-in-1 over toilet) Toileting - Clothing Manipulation and Hygiene: Performed;Supervision/safety Where Assessed - Engineer, mining and Hygiene: Sit on 3-in-1 or toilet Tub/Shower Transfer: Performed;Min guard Equipment Used: Rolling walker ADL Comments: Min verbal cues for safety with hand placement. She  states family can help with LB self care at d/c. Discussed use of 3in1 as a shower chair and over toilet. She is 4 foot 8 but states she doesnt have any UE supports for transitioning on and off the toilet.     OT Diagnosis:    OT Problem List:   OT Treatment Interventions:     OT Goals(Current goals can be found in the care plan section) Acute Rehab OT Goals Patient Stated Goal: home  Visit Information  Last OT Received On: 12/27/12 Assistance Needed: +1 History of Present Illness: LEFT TOTAL KNEE ARTHROPLASTY (Left)        Prior Functioning     Home Living Family/patient expects to be discharged to:: Private residence Living Arrangements: Spouse/significant other Available Help at Discharge: Family Type of Home: House Home Access: Stairs to enter Secretary/administrator of Steps: 2 Entrance Stairs-Rails: Left;Right Home Layout: One level Home Equipment: Environmental consultant - 2 wheels;Other (comment) (?BSC) Prior Function Level of Independence: Independent Communication Communication: No difficulties         Vision/Perception     Cognition  Cognition Arousal/Alertness: Awake/alert Behavior During Therapy: WFL for tasks assessed/performed Overall Cognitive Status: Within Functional Limits for tasks assessed    Extremity/Trunk Assessment Upper Extremity Assessment Upper Extremity Assessment: Overall WFL for tasks assessed     Mobility Bed Mobility Supine to Sit: 5: Supervision;HOB elevated Details for Bed Mobility Assistance: pt slightly turned on L side when OT entered. Turned to be supine and transfer to EOB.  Transfers Transfers: Sit to Stand;Stand to Sit Sit to Stand: 5: Supervision;With upper extremity assist;From bed;From chair/3-in-1 Stand to Sit: With upper extremity assist;5: Supervision;To chair/3-in-1 Details for Transfer Assistance: verbal cues for safety, hand placement.      Exercise     Balance Balance Balance Assessed: Yes Dynamic Standing  Balance Dynamic  Standing - Level of Assistance: 5: Stand by assistance   End of Session OT - End of Session Equipment Utilized During Treatment: Rolling walker Activity Tolerance: Patient tolerated treatment well Patient left: in chair;with call bell/phone within reach  GO     Lennox Laity [phone] 12/27/2012, 10:39 AM

## 2012-12-27 NOTE — Progress Notes (Signed)
   Subjective: 1 Day Post-Op Procedure(s) (LRB): LEFT TOTAL KNEE ARTHROPLASTY (Left)   Patient reports pain as mild, pain well controlled. No events throughout the night. Ready to be discharged home after PT.  Objective:   VITALS:   Filed Vitals:   12/27/12 0513  BP: 128/82  Pulse: 86  Temp: 97.6 F (36.4 C)  Resp: 16    Neurovascular intact Dorsiflexion/Plantar flexion intact Incision: dressing C/D/I No cellulitis present Compartment soft  LABS  Recent Labs  12/27/12 0420  HGB 8.6*  HCT 26.1*  WBC 14.3*  PLT 312     Recent Labs  12/27/12 0420  NA 137  K 4.2  BUN 13  CREATININE 0.78  GLUCOSE 122*     Assessment/Plan: 1 Day Post-Op Procedure(s) (LRB): LEFT TOTAL KNEE ARTHROPLASTY (Left) Advance diet Up with therapy D/C IV fluids Discharge home with home health Follow up in 2 weeks at [facility]. Follow up with OLIN,Bryer Cozzolino D in 2 weeks.  Contact information:  [facility] [address], [address] [place] [address] [phone]    Expected ABLA  Treated with iron and will observe  Obese (BMI 30-39.9) Estimated body mass index is 32.3 kg/(m^2) as calculated from the following:   Height as of this encounter: 4\' 8"  (1.422 m).   Weight as of this encounter: 65.318 kg (144 lb). Patient also counseled that weight may inhibit the healing process Patient counseled that losing weight will help with future health issues       Anastasio Auerbach. Charisa Twitty   PAC  12/27/2012, 7:51 AM

## 2012-12-27 NOTE — Progress Notes (Signed)
Physical Therapy Treatment Note   12/27/12 1600  PT Visit Information  Last PT Received On 12/27/12  Assistance Needed +1  PT Time Calculation  PT Start Time 1522  PT Stop Time 1534  PT Time Calculation (min) 12 min  Subjective Data  Subjective Pt ambulated again in hallway.  Verbally reviewed safe stair technique.  Pt had no further questions/concerns.  Pt to d/c home later today.  Precautions  Precautions Knee  Restrictions  Other Position/Activity Restrictions WBAT  Cognition  Arousal/Alertness Awake/alert  Behavior During Therapy WFL for tasks assessed/performed  Overall Cognitive Status Within Functional Limits for tasks assessed  Bed Mobility  Bed Mobility Right Sidelying to Sit;Sit to Sidelying Right  Right Sidelying to Sit 5: Supervision;HOB flat  Sit to Sidelying Right 5: Set up;HOB flat  Details for Bed Mobility Assistance pt laying on side upon entering room, again discussed again keeping L knee straight esp during sidelying and no pillows under knee at rest, self assist using sheet for L LE onto bed  Transfers  Transfers Sit to Stand;Stand to Sit  Sit to Stand With upper extremity assist;From bed;5: Supervision  Stand to Sit With upper extremity assist;5: Supervision;To bed  Ambulation/Gait  Ambulation/Gait Assistance 5: Supervision  Ambulation Distance (Feet) 220 Feet  Assistive device Rolling walker  Ambulation/Gait Assistance Details verbal cues for heel strike and RW distance  Gait Pattern Step-through pattern;Decreased stride length;Trunk flexed  PT - End of Session  Activity Tolerance Patient tolerated treatment well  Patient left in bed;with call bell/phone within reach  PT - Assessment/Plan  PT Plan Current plan remains appropriate  PT Frequency 7X/week  Follow Up Recommendations Home health PT  PT equipment None recommended by PT  PT Goal Progression  Progress towards PT goals Progressing toward goals  PT General Charges  $$ ACUTE PT VISIT 1  Procedure  PT Treatments  $Gait Training 8-22 mins   Zenovia Jarred, PT, DPT 12/27/2012 Pager: [phone]

## 2012-12-30 LAB — TYPE AND SCREEN
ABO/RH(D): O POS
Antibody Screen: NEGATIVE
Unit division: 0

## 2012-12-30 NOTE — Discharge Summary (Signed)
Physician Discharge Summary  Patient ID: [name] MRN: [id number] DOB/AGE: 01-25-1950 62 y.o.  Admit date: 12/26/2012 Discharge date: 12/27/2012   Procedures:  Procedure(s) (LRB): LEFT TOTAL KNEE ARTHROPLASTY (Left)  Attending Physician:  Dr. Durene Romans   Admission Diagnoses:   Left knee OA / pain  Discharge Diagnoses:  Principal Problem:   S/P left TKA Active Problems:   Expected blood loss anemia   Obese  Past Medical History  Diagnosis Date  . ADD (attention deficit disorder)   . Depression   . GERD (gastroesophageal reflux disease)   . Hyperlipemia     "borderline"  . IBS (irritable bowel syndrome)     chronic constipation  . Fibromyalgia   . Osteoporosis   . Bronchitis     hx of  . Hepatitis     B  . Cataract of left eye     since birth  . Osteoarthritis     HPI: [name], 62 y.o. female, has a history of pain and functional disability in the left knee due to arthritis and has failed non-surgical conservative treatments for greater than 12 weeks to includeNSAID's and/or analgesics, corticosteriod injections and activity modification. Onset of symptoms was gradual, starting 8+ years ago with gradually worsening course since that time. The patient noted prior procedures on the knee to include arthroplasty on the right knee(s). Patient currently rates pain in the left knee(s) at 7 out of 10 with activity. Patient has worsening of pain with activity and weight bearing, pain that interferes with activities of daily living, pain with passive range of motion, crepitus and joint swelling. Patient has evidence of periarticular osteophytes and joint space narrowing by imaging studies. There is no active infection. Risks, benefits and expectations were discussed with the patient. Risks including but not limited to the risk of anesthesia, blood clots, nerve damage, blood vessel damage, failure of the prosthesis, infection and up to and including death.  Patient understand the risks, benefits and expectations and wishes to proceed with surgery.  PCP: Loreen Freud, DO   Discharged Condition: good  Hospital Course:  Patient underwent the above stated procedure on 12/26/2012. Patient tolerated the procedure well and brought to the recovery room in good condition and subsequently to the floor.  POD #1 BP: 128/82 ; Pulse: 86 ; Temp: 97.6 F (36.4 C) ; Resp: 16  Patient reports pain as mild, pain well controlled. No events throughout the night. Ready to be discharged home after PT. Neurovascular intact, dorsiflexion/plantar flexion intact, incision: dressing C/D/I, no cellulitis present and compartment soft.   LABS  Basename    HGB  8.6  HCT  26.1    Discharge Exam: General appearance: alert, cooperative and no distress Extremities: Homans sign is negative, no sign of DVT, no edema, redness or tenderness in the calves or thighs and no ulcers, gangrene or trophic changes  Disposition:   Home-Health Care Svc with follow up in 2 weeks   Follow-up Information   Follow up with Shelda Pal, MD. Schedule an appointment as soon as possible for a visit in 2 weeks.   Specialty:  Orthopedic Surgery   Contact information:   [address] [place] [address] [phone]       Discharge Orders   Future Orders Complete By Expires   Call MD / Call 911  As directed    Comments:     If you experience chest pain or shortness of breath, CALL 911 and be transported  to the hospital emergency room.  If you develope a fever above 101 F, pus (white drainage) or increased drainage or redness at the wound, or calf pain, call your surgeon's office.   Change dressing  As directed    Comments:     Maintain surgical dressing for 14 days, or until follow up at the clinic. Keep the area dry and clean.   Constipation Prevention  As directed    Comments:     Drink plenty of fluids.  Prune juice may be helpful.  You may use a stool  softener, such as Colace (over the counter) 100 mg twice a day.  Use MiraLax (over the counter) for constipation as needed.   Diet - low sodium heart healthy  As directed    Discharge instructions  As directed    Comments:     Maintain surgical dressing for 14 days, or until return to the clinic. Keep the area dry and clean until follow up. Follow up in 2 weeks at [facility]. Call with any questions or concerns.   Increase activity slowly as tolerated  As directed    TED hose  As directed    Comments:     Use stockings (TED hose) for 2 weeks on both leg(s).  You may remove them at night for sleeping.   Weight bearing as tolerated  As directed    Questions:     Laterality:     Extremity:          Medication List    STOP taking these medications       traMADol 50 MG tablet  Commonly known as:  ULTRAM      TAKE these medications       alendronate 10 MG tablet  Commonly known as:  FOSAMAX  Take 10 mg by mouth once a week. Take with a full glass of water on an empty stomach.     ARIPiprazole 2 MG tablet  Commonly known as:  ABILIFY  Take 1 mg by mouth daily.     aspirin 325 MG EC tablet  Take 1 tablet (325 mg total) by mouth 2 (two) times daily.     CALCIUM-MAGNESIUM-ZINC PO  Take 2 capsules by mouth at bedtime.     celecoxib 200 MG capsule  Commonly known as:  CELEBREX  Take 200 mg by mouth 2 (two) times daily.     clonazePAM 1 MG tablet  Commonly known as:  KLONOPIN  Take 1 mg by mouth at bedtime.     cyclobenzaprine 10 MG tablet  Commonly known as:  FLEXERIL  Take 1 tablet (10 mg total) by mouth 2 (two) times daily as needed for muscle spasms.     DSS 100 MG Caps  Take 100 mg by mouth 2 (two) times daily.     EPINEPHrine 0.3 mg/0.3 mL Devi  Commonly known as:  EPIPEN  Inject 0.3 mLs (0.3 mg total) into the muscle once.     ezetimibe 10 MG tablet  Commonly known as:  ZETIA  Take 1 tablet (10 mg total) by mouth daily.     ferrous sulfate 325  (65 FE) MG tablet  Take 1 tablet (325 mg total) by mouth 3 (three) times daily after meals.     Fish Oil 1200 MG Caps  Take 2,400 capsules by mouth daily.     GNP NIACIN 250 MG tablet  Generic drug:  niacin  Take 500 mg by mouth daily with breakfast.  hydrochlorothiazide 25 MG tablet  Commonly known as:  HYDRODIURIL  Take 25 mg by mouth daily.     HYDROcodone-acetaminophen 7.5-325 MG per tablet  Commonly known as:  NORCO  Take 1-2 tablets by mouth every 4 (four) hours as needed for moderate pain.     methylphenidate 10 MG tablet  Commonly known as:  RITALIN  Take 10 mg by mouth 2 (two) times daily with breakfast and lunch.     multivitamin with minerals Tabs tablet  Take 1 tablet by mouth daily.     omeprazole 20 MG capsule  Commonly known as:  PRILOSEC  Take 20 mg by mouth daily.     OVER THE COUNTER MEDICATION  Take 2 tablets by mouth daily. TUMERIC     polyethylene glycol packet  Commonly known as:  MIRALAX / GLYCOLAX  Take 17 g by mouth 2 (two) times daily.     pregabalin 150 MG capsule  Commonly known as:  LYRICA  Take 2 capsules (300 mg total) by mouth daily.     pregabalin 150 MG capsule  Commonly known as:  LYRICA  Take 150 mg by mouth 2 (two) times daily.     zolpidem 5 MG tablet  Commonly known as:  AMBIEN  Take 5 mg by mouth at bedtime as needed for sleep.         Signed: Anastasio Auerbach. [name]   PAC  12/30/2012, 9:01 AM

## 2013-01-17 ENCOUNTER — Encounter: Payer: Self-pay | Admitting: Family Medicine

## 2013-01-18 ENCOUNTER — Encounter: Payer: Self-pay | Admitting: Family Medicine

## 2013-01-18 MED ORDER — CYCLOBENZAPRINE HCL 10 MG PO TABS: 10.0000 mg | ORAL_TABLET | Freq: Two times a day (BID) | ORAL | Status: DC | PRN

## 2013-01-18 NOTE — Telephone Encounter (Signed)
Please advise      KP 

## 2013-01-19 ENCOUNTER — Encounter: Payer: Self-pay | Admitting: Family Medicine

## 2013-01-20 MED ORDER — HYOSCYAMINE SULFATE 0.125 MG SL SUBL: 0.1250 mg | SUBLINGUAL_TABLET | SUBLINGUAL | Status: DC | PRN

## 2013-01-27 ENCOUNTER — Encounter: Payer: Self-pay | Admitting: Family Medicine

## 2013-01-30 ENCOUNTER — Telehealth: Payer: Self-pay | Admitting: *Deleted

## 2013-01-30 ENCOUNTER — Other Ambulatory Visit: Payer: Self-pay | Admitting: *Deleted

## 2013-01-30 NOTE — Telephone Encounter (Signed)
[name], is she still taking it? I see hydrocodone in her list, please clarify

## 2013-01-30 NOTE — Telephone Encounter (Signed)
She is taking the Tramadol regularly, Hydrocodone was prescribed as a one time dose by the hospital.   KP

## 2013-01-30 NOTE — Telephone Encounter (Signed)
Patient is requesting refill for Tramadol  Last seen-11/25/2012  Last filled-12/25/2012  UDS- 01/05/2012  contract signed  Please advise. SW

## 2013-01-30 NOTE — Telephone Encounter (Signed)
Okay  #120, no refill, if she is not up-to-date on UDS  please obtain one.

## 2013-01-31 MED ORDER — TRAMADOL HCL 50 MG PO TABS: 50.0000 mg | ORAL_TABLET | Freq: Four times a day (QID) | ORAL | Status: DC | PRN

## 2013-01-31 NOTE — Telephone Encounter (Signed)
Rx printed. Patient notified  

## 2013-02-03 ENCOUNTER — Telehealth: Payer: Self-pay

## 2013-02-03 ENCOUNTER — Encounter: Payer: Self-pay | Admitting: Family Medicine

## 2013-02-03 MED ORDER — ZOLPIDEM TARTRATE 5 MG PO TABS: 5.0000 mg | ORAL_TABLET | Freq: Every evening | ORAL | Status: DC | PRN

## 2013-02-03 NOTE — Telephone Encounter (Signed)
Ok for #45, no refills 

## 2013-02-03 NOTE — Telephone Encounter (Signed)
Last seen 11/25/12 and filled 11/24/12 #45. Please advise     KP

## 2013-02-03 NOTE — Telephone Encounter (Signed)
Faxed.   KP 

## 2013-02-16 ENCOUNTER — Other Ambulatory Visit: Payer: Self-pay

## 2013-02-16 DIAGNOSIS — Z1231 Encounter for screening mammogram for malignant neoplasm of breast: Secondary | ICD-10-CM

## 2013-02-20 ENCOUNTER — Telehealth: Payer: Self-pay | Admitting: Family Medicine

## 2013-02-20 NOTE — Telephone Encounter (Signed)
[facility] called stating they need silverback referral for this patient. [employer] ID# [id number] Narda Amber icd 9 [id number] Date of service 02/20/13

## 2013-02-21 ENCOUNTER — Encounter: Payer: Self-pay | Admitting: Family Medicine

## 2013-02-23 NOTE — Telephone Encounter (Signed)
Molli Barrows is Dr. Aurea Graff PA. No referral needed for [facility] providers.

## 2013-02-24 ENCOUNTER — Ambulatory Visit
Admission: RE | Admit: 2013-02-24 | Discharge: 2013-02-24 | Disposition: A | Discharge status: Home / Self Care | Payer: Commercial Managed Care - HMO | Source: Ambulatory Visit

## 2013-02-24 ENCOUNTER — Encounter: Payer: Self-pay | Admitting: Family Medicine

## 2013-02-24 DIAGNOSIS — Z1231 Encounter for screening mammogram for malignant neoplasm of breast: Secondary | ICD-10-CM

## 2013-02-24 MED ORDER — CYCLOBENZAPRINE HCL 10 MG PO TABS: 10.0000 mg | ORAL_TABLET | Freq: Two times a day (BID) | ORAL | Status: DC | PRN

## 2013-02-24 MED ORDER — CLONAZEPAM 1 MG PO TABS: 1.0000 mg | ORAL_TABLET | Freq: Every day | ORAL | Status: DC

## 2013-03-09 ENCOUNTER — Encounter: Payer: Self-pay | Admitting: Family Medicine

## 2013-03-09 DIAGNOSIS — M797 Fibromyalgia: Secondary | ICD-10-CM

## 2013-03-09 DIAGNOSIS — N898 Other specified noninflammatory disorders of vagina: Secondary | ICD-10-CM

## 2013-03-10 ENCOUNTER — Other Ambulatory Visit: Payer: Self-pay

## 2013-03-10 DIAGNOSIS — M1990 Unspecified osteoarthritis, unspecified site: Secondary | ICD-10-CM

## 2013-03-10 DIAGNOSIS — N898 Other specified noninflammatory disorders of vagina: Secondary | ICD-10-CM

## 2013-03-10 MED ORDER — ESTROGENS, CONJUGATED 0.625 MG/GM VA CREA: TOPICAL_CREAM | VAGINAL | Status: DC

## 2013-03-10 MED ORDER — TRAMADOL HCL 50 MG PO TABS
50.0000 mg | ORAL_TABLET | Freq: Four times a day (QID) | ORAL | Status: DC | PRN
Start: 2013-03-10 — End: 2013-03-20

## 2013-03-10 MED ORDER — ALENDRONATE SODIUM 10 MG PO TABS: 10.0000 mg | ORAL_TABLET | ORAL | Status: DC

## 2013-03-10 MED ORDER — MELOXICAM 15 MG PO TABS: 15.0000 mg | ORAL_TABLET | Freq: Every day | ORAL | Status: DC

## 2013-03-10 MED ORDER — PREGABALIN 150 MG PO CAPS: 300.0000 mg | ORAL_CAPSULE | Freq: Every day | ORAL | Status: DC

## 2013-03-10 NOTE — Telephone Encounter (Signed)
Per Dr.Lowne ok to send the Mobic. All med's sent to [facility] per [facility].     KP

## 2013-03-10 NOTE — Addendum Note (Signed)
Addended by: Ewing Schlein on: 03/10/2013 12:15 PM   Modules accepted: Orders

## 2013-03-10 NOTE — Telephone Encounter (Signed)
Patient is want to go back to the Mobic 90 days instead of the Celebrex. Please advise      KP

## 2013-03-10 NOTE — Telephone Encounter (Signed)
Please advise if medication refills are appropriate.      KP

## 2013-03-14 ENCOUNTER — Telehealth: Payer: Self-pay

## 2013-03-14 NOTE — Telephone Encounter (Signed)
Left message for call back Non-identifiable   Pap- 10/09/11- negative CCS- 01/21/09-negative MMg- 10/09/11-negative BD- 09/08/11-osteopenia Flu- 11/04/12 Td- 09/22/10 Z-

## 2013-03-15 ENCOUNTER — Telehealth: Payer: Self-pay | Admitting: Family Medicine

## 2013-03-15 ENCOUNTER — Ambulatory Visit (INDEPENDENT_AMBULATORY_CARE_PROVIDER_SITE_OTHER): Payer: Medicare HMO | Admitting: Family Medicine

## 2013-03-15 ENCOUNTER — Encounter: Payer: Self-pay | Admitting: Family Medicine

## 2013-03-15 ENCOUNTER — Telehealth: Payer: Self-pay | Admitting: *Deleted

## 2013-03-15 VITALS — BP 118/62 | HR 92 | Temp 98.4°F | Ht <= 58 in | Wt 149.0 lb

## 2013-03-15 DIAGNOSIS — M62838 Other muscle spasm: Secondary | ICD-10-CM

## 2013-03-15 DIAGNOSIS — K219 Gastro-esophageal reflux disease without esophagitis: Secondary | ICD-10-CM

## 2013-03-15 DIAGNOSIS — Z23 Encounter for immunization: Secondary | ICD-10-CM

## 2013-03-15 DIAGNOSIS — E785 Hyperlipidemia, unspecified: Secondary | ICD-10-CM

## 2013-03-15 DIAGNOSIS — Z0000 Encounter for general adult medical examination without abnormal findings: Secondary | ICD-10-CM

## 2013-03-15 DIAGNOSIS — G4700 Insomnia, unspecified: Secondary | ICD-10-CM

## 2013-03-15 DIAGNOSIS — I10 Essential (primary) hypertension: Secondary | ICD-10-CM

## 2013-03-15 DIAGNOSIS — E669 Obesity, unspecified: Secondary | ICD-10-CM | POA: Insufficient documentation

## 2013-03-15 DIAGNOSIS — Z7251 High risk heterosexual behavior: Secondary | ICD-10-CM

## 2013-03-15 DIAGNOSIS — R7309 Other abnormal glucose: Secondary | ICD-10-CM

## 2013-03-15 DIAGNOSIS — R739 Hyperglycemia, unspecified: Secondary | ICD-10-CM

## 2013-03-15 LAB — POCT URINALYSIS DIPSTICK
Bilirubin, UA: NEGATIVE
Blood, UA: NEGATIVE
GLUCOSE UA: NEGATIVE
Ketones, UA: NEGATIVE
Leukocytes, UA: NEGATIVE
Nitrite, UA: NEGATIVE
PROTEIN UA: NEGATIVE
Spec Grav, UA: <=1.005
UROBILINOGEN UA: 0.2
pH, UA: 7.5

## 2013-03-15 MED ORDER — ZOSTER VACCINE LIVE 19400 UNT/0.65ML ~~LOC~~ SOLR: 0.6500 mL | Freq: Once | SUBCUTANEOUS | Status: DC

## 2013-03-15 MED ORDER — ZOLPIDEM TARTRATE 5 MG PO TABS: 5.0000 mg | ORAL_TABLET | Freq: Every evening | ORAL | Status: DC | PRN

## 2013-03-15 MED ORDER — CLONAZEPAM 1 MG PO TABS: 1.0000 mg | ORAL_TABLET | Freq: Every day | ORAL | Status: DC

## 2013-03-15 MED ORDER — ESOMEPRAZOLE MAGNESIUM 40 MG PO CPDR: 40.0000 mg | DELAYED_RELEASE_CAPSULE | Freq: Every day | ORAL | Status: DC

## 2013-03-15 MED ORDER — CYCLOBENZAPRINE HCL 10 MG PO TABS: 10.0000 mg | ORAL_TABLET | Freq: Two times a day (BID) | ORAL | Status: DC | PRN

## 2013-03-15 NOTE — Telephone Encounter (Signed)
Relevant patient education assigned to patient using Emmi. ° °

## 2013-03-15 NOTE — Progress Notes (Signed)
Subjective:    [name] is a 63 y.o. female who presents for Medicare Annual/Subsequent preventive examination.  Preventive Screening-Counseling & Management  Tobacco History  Smoking status  . Former Smoker -- 0.25 packs/day for 20 years  . Types: Cigarettes  . Quit date: 01/13/1988  Smokeless tobacco  . Never Used     Problems Prior to Visit 1.   Current Problems (verified) Patient Active Problem List   Diagnosis Date Noted  . Obesity (BMI 30-39.9) 03/15/2013  . Expected blood loss anemia 12/27/2012  . Obese 12/27/2012  . S/P left TKA 12/26/2012  . Atypical mole 05/25/2012  . Rash 02/03/2012  . Bronchitis with asthma, subacute 02/03/2012  . Weight gain 04/15/2011  . Sleep apnea 04/15/2011  . Edema 04/15/2011  . CONSTIPATION 11/05/2009  . ABDOMINAL PAIN, LEFT UPPER QUADRANT 11/05/2009  . OTHER OSTEOPOROSIS 10/24/2009  . UNSPECIFIED VITAMIN D DEFICIENCY 04/17/2009  . POSTMENOPAUSAL STATUS 04/17/2009  . UTI 02/15/2009  . GOITER, UNSPECIFIED 09/05/2008  . HYPERLIPIDEMIA 07/24/2008  . ADD 06/14/2008  . ASTHMA 03/07/2008  . ESOPHAGEAL STRICTURE 03/07/2008  . IBS 03/07/2008  . BENIGN NEOPLASM OF ADRENAL GLAND 01/20/2008  . DEPRESSION 01/20/2008  . GERD 01/20/2008  . VENTRAL HERNIA 01/20/2008  . FIBROCYSTIC BREAST DISEASE 01/20/2008  . RHEUMATOID ARTHRITIS 01/20/2008  . HEPATITIS B, HX OF 01/20/2008  . NASAL POLYPECTOMY, HX OF 01/20/2008  . LOWER LIMB AMPUTATION, OTHER TOE 01/20/2008    Medications Prior to Visit Current Outpatient Prescriptions on File Prior to Visit  Medication Sig Dispense Refill  . alendronate (FOSAMAX) 10 MG tablet Take 1 tablet (10 mg total) by mouth once a week. Take with a full glass of water on an empty stomach.  12 tablet  3  . ARIPiprazole (ABILIFY) 2 MG tablet Take 1 mg by mouth daily.      Marland Kitchen CALCIUM-MAGNESIUM-ZINC PO Take 2 capsules by mouth at bedtime.      . clonazePAM (KLONOPIN) 1 MG tablet Take 1 tablet (1 mg total)  by mouth at bedtime.  30 tablet  0  . conjugated estrogens (PREMARIN) vaginal cream 1 g pv qd for 2 weeks then decrease to every other day.  Wean down to 0.5 g  2-3 days a week if possible  42.5 g  3  . cyclobenzaprine (FLEXERIL) 10 MG tablet Take 1 tablet (10 mg total) by mouth 2 (two) times daily as needed for muscle spasms.  180 tablet  0  . docusate sodium 100 MG CAPS Take 100 mg by mouth 2 (two) times daily.  10 capsule  0  . EPINEPHrine (EPIPEN) 0.3 mg/0.3 mL DEVI Inject 0.3 mLs (0.3 mg total) into the muscle once.  1 Device  2  . ferrous sulfate 325 (65 FE) MG tablet Take 1 tablet (325 mg total) by mouth 3 (three) times daily after meals.    3  . hydrochlorothiazide (HYDRODIURIL) 25 MG tablet Take 25 mg by mouth daily.      . hyoscyamine (LEVSIN/SL) 0.125 MG SL tablet Place 1 tablet (0.125 mg total) under the tongue every 4 (four) hours as needed.  30 tablet  0  . meloxicam (MOBIC) 15 MG tablet Take 1 tablet (15 mg total) by mouth daily.  90 tablet  1  . methylphenidate (RITALIN) 10 MG tablet Take 10 mg by mouth 2 (two) times daily with breakfast and lunch.      . Multiple Vitamin (MULTIVITAMIN WITH MINERALS) TABS tablet Take 1 tablet by mouth daily.      Marland Kitchen  niacin (GNP NIACIN) 250 MG tablet Take 500 mg by mouth daily with breakfast.       . Omega-3 Fatty Acids (FISH OIL) 1200 MG CAPS Take 2,400 capsules by mouth daily.      Marland Kitchen OVER THE COUNTER MEDICATION Take 2 tablets by mouth daily. TUMERIC      . polyethylene glycol (MIRALAX / GLYCOLAX) packet Take 17 g by mouth 2 (two) times daily.  14 each  0  . pregabalin (LYRICA) 150 MG capsule Take 150 mg by mouth 2 (two) times daily.      . pregabalin (LYRICA) 150 MG capsule Take 2 capsules (300 mg total) by mouth daily.  180 capsule  2  . traMADol (ULTRAM) 50 MG tablet Take 1 tablet (50 mg total) by mouth every 6 (six) hours as needed.  120 tablet  0  . celecoxib (CELEBREX) 200 MG capsule Take 200 mg by mouth 2 (two) times daily.      Marland Kitchen ezetimibe  (ZETIA) 10 MG tablet Take 1 tablet (10 mg total) by mouth daily.  30 tablet  2  . HYDROcodone-acetaminophen (NORCO) 7.5-325 MG per tablet Take 1-2 tablets by mouth every 4 (four) hours as needed for moderate pain.  100 tablet  0   No current facility-administered medications on file prior to visit.    Current Medications (verified) Current Outpatient Prescriptions  Medication Sig Dispense Refill  . alendronate (FOSAMAX) 10 MG tablet Take 1 tablet (10 mg total) by mouth once a week. Take with a full glass of water on an empty stomach.  12 tablet  3  . ARIPiprazole (ABILIFY) 2 MG tablet Take 1 mg by mouth daily.      Marland Kitchen CALCIUM-MAGNESIUM-ZINC PO Take 2 capsules by mouth at bedtime.      . clonazePAM (KLONOPIN) 1 MG tablet Take 1 tablet (1 mg total) by mouth at bedtime.  30 tablet  0  . conjugated estrogens (PREMARIN) vaginal cream 1 g pv qd for 2 weeks then decrease to every other day.  Wean down to 0.5 g  2-3 days a week if possible  42.5 g  3  . cyclobenzaprine (FLEXERIL) 10 MG tablet Take 1 tablet (10 mg total) by mouth 2 (two) times daily as needed for muscle spasms.  180 tablet  0  . docusate sodium 100 MG CAPS Take 100 mg by mouth 2 (two) times daily.  10 capsule  0  . EPINEPHrine (EPIPEN) 0.3 mg/0.3 mL DEVI Inject 0.3 mLs (0.3 mg total) into the muscle once.  1 Device  2  . ferrous sulfate 325 (65 FE) MG tablet Take 1 tablet (325 mg total) by mouth 3 (three) times daily after meals.    3  . hydrochlorothiazide (HYDRODIURIL) 25 MG tablet Take 25 mg by mouth daily.      . hyoscyamine (LEVSIN/SL) 0.125 MG SL tablet Place 1 tablet (0.125 mg total) under the tongue every 4 (four) hours as needed.  30 tablet  0  . meloxicam (MOBIC) 15 MG tablet Take 1 tablet (15 mg total) by mouth daily.  90 tablet  1  . methylphenidate (RITALIN) 10 MG tablet Take 10 mg by mouth 2 (two) times daily with breakfast and lunch.      . Multiple Vitamin (MULTIVITAMIN WITH MINERALS) TABS tablet Take 1 tablet by mouth  daily.      . niacin (GNP NIACIN) 250 MG tablet Take 500 mg by mouth daily with breakfast.       . Omega-3 Fatty  Acids (FISH OIL) 1200 MG CAPS Take 2,400 capsules by mouth daily.      Marland Kitchen OVER THE COUNTER MEDICATION Take 2 tablets by mouth daily. TUMERIC      . polyethylene glycol (MIRALAX / GLYCOLAX) packet Take 17 g by mouth 2 (two) times daily.  14 each  0  . pregabalin (LYRICA) 150 MG capsule Take 150 mg by mouth 2 (two) times daily.      . pregabalin (LYRICA) 150 MG capsule Take 2 capsules (300 mg total) by mouth daily.  180 capsule  2  . traMADol (ULTRAM) 50 MG tablet Take 1 tablet (50 mg total) by mouth every 6 (six) hours as needed.  120 tablet  0  . zolpidem (AMBIEN) 5 MG tablet Take 1 tablet (5 mg total) by mouth at bedtime as needed for sleep.  45 tablet  0  . celecoxib (CELEBREX) 200 MG capsule Take 200 mg by mouth 2 (two) times daily.      Marland Kitchen esomeprazole (NEXIUM) 40 MG capsule Take 1 capsule (40 mg total) by mouth daily.  30 capsule  3  . ezetimibe (ZETIA) 10 MG tablet Take 1 tablet (10 mg total) by mouth daily.  30 tablet  2  . HYDROcodone-acetaminophen (NORCO) 7.5-325 MG per tablet Take 1-2 tablets by mouth every 4 (four) hours as needed for moderate pain.  100 tablet  0  . zoster vaccine live, PF, (ZOSTAVAX) 16109 UNT/0.65ML injection Inject 19,400 Units into the skin once.  1 vial  0   No current facility-administered medications for this visit.     Allergies (verified) Cefuroxime axetil; Metaxalone; Simvastatin; Tetracycline; Erythromycin; and Sulfonamide derivatives   PAST HISTORY  Family History Family History  Problem Relation Age of Onset  . Prostate cancer Paternal Grandfather   . Lung cancer Paternal Grandfather   . Arthritis Mother   . Heart disease Mother     atrial fib  . Cancer Maternal Uncle     prostate  . Hypertension Maternal Grandmother   . Cancer Maternal Grandfather     prostate. lung  . Hypertension Paternal Grandmother     Social  History History  Substance Use Topics  . Smoking status: Former Smoker -- 0.25 packs/day for 20 years    Types: Cigarettes    Quit date: 01/13/1988  . Smokeless tobacco: Never Used  . Alcohol Use: Yes     Comment: occasional     Are there smokers in your home (other than you)? No  Risk Factors Current exercise habits: Gym/ health club routine includes cardio and yoga.  Dietary issues discussed: na   Cardiac risk factors: dyslipidemia, hypertension and obesity (BMI >= 30 kg/m2).  Depression Screen (Note: if answer to either of the following is "Yes", a more complete depression screening is indicated)   Over the past two weeks, have you felt down, depressed or hopeless? No  Over the past two weeks, have you felt little interest or pleasure in doing things? No  Have you lost interest or pleasure in daily life? No  Do you often feel hopeless? No  Do you cry easily over simple problems? No  Activities of Daily Living In your present state of health, do you have any difficulty performing the following activities?:  Driving? No Managing money?  No Feeding yourself? No Getting from bed to chair? No Climbing a flight of stairs? No Preparing food and eating?: No Bathing or showering? No Getting dressed: No Getting to the toilet? No Using the toilet:No Moving  around from place to place: No In the past year have you fallen or had a near fall?:No   Are you sexually active?  Yes  Do you have more than one partner?  yes  Hearing Difficulties: No Do you often ask people to speak up or repeat themselves? No Do you experience ringing or noises in your ears? No Do you have difficulty understanding soft or whispered voices? No   Do you feel that you have a problem with memory? No  Do you often misplace items? No  Do you feel safe at home?  Yes  Cognitive Testing  Alert? Yes  Normal Appearance?Yes  Oriented to person? Yes  Place? Yes   Time? Yes  Recall of three objects?        Can perform simple calculations? Yes  Displays appropriate judgment?Yes  Can read the correct time from a watch face?Yes   Advanced Directives have been discussed with the patient? Yes  List the Names of Other Physician/Practitioners you currently use: 1.  Gi--mann   Indicate any recent Medical Services you may have received from other than [facility] providers in the past year (date may be approximate).  Immunization History  Administered Date(s) Administered  . Influenza Split 09/29/2011  . Influenza Whole 11/12/2008, 09/30/2009, 09/22/2010  . Influenza,inj,Quad PF,36+ Mos 11/04/2012  . Td 01/26/2001  . Tdap 09/22/2010    Screening Tests Health Maintenance  Topic Date Due  . Zostavax  01/13/2010  . Influenza Vaccine  08/12/2013  . Mammogram  10/08/2013  . Pap Smear  10/09/2014  . Colonoscopy  01/22/2019  . Tetanus/tdap  09/21/2020    All answers were reviewed with the patient and necessary referrals were made:  Garnet Koyanagi, DO   03/15/2013   History reviewed:  She  has a past medical history of ADD (attention deficit disorder); Depression; GERD (gastroesophageal reflux disease); Hyperlipemia; IBS (irritable bowel syndrome); Fibromyalgia; Osteoporosis; Bronchitis; Hepatitis; Cataract of left eye; and Osteoarthritis. She  does not have any pertinent problems on file. She  has past surgical history that includes Cholecystectomy; Cesarean section; Tonsillectomy; Total knee arthroplasty (Right, 2009); Toe amputation (2007); Ventral hernia repair (2007); Carpal tunnel release (Bilateral); Bladder suspension (2001); Abdominal hysterectomy (2001); Upper gi endoscopy; Nose surgery (Left, 2007); Breast surgery; and Total knee arthroplasty (Left, 12/26/2012). Her family history includes Arthritis in her mother; Cancer in her maternal grandfather and maternal uncle; Heart disease in her mother; Hypertension in her maternal grandmother and paternal grandmother; Lung cancer in her  paternal grandfather; Prostate cancer in her paternal grandfather. She  reports that she quit smoking about 25 years ago. Her smoking use included Cigarettes. She has a 5 pack-year smoking history. She has never used smokeless tobacco. She reports that she drinks alcohol. She reports that she does not use illicit drugs. She has a current medication list which includes the following prescription(s): alendronate, aripiprazole, calcium-magnesium-zinc, clonazepam, conjugated estrogens, cyclobenzaprine, dss, epinephrine, ferrous sulfate, hydrochlorothiazide, hyoscyamine, meloxicam, methylphenidate, multivitamin with minerals, niacin, fish oil, OVER THE COUNTER MEDICATION, polyethylene glycol, pregabalin, pregabalin, tramadol, zolpidem, celecoxib, esomeprazole, ezetimibe, hydrocodone-acetaminophen, and zoster vaccine live (pf). Current Outpatient Prescriptions on File Prior to Visit  Medication Sig Dispense Refill  . alendronate (FOSAMAX) 10 MG tablet Take 1 tablet (10 mg total) by mouth once a week. Take with a full glass of water on an empty stomach.  12 tablet  3  . ARIPiprazole (ABILIFY) 2 MG tablet Take 1 mg by mouth daily.      Marland Kitchen CALCIUM-MAGNESIUM-ZINC PO  Take 2 capsules by mouth at bedtime.      . clonazePAM (KLONOPIN) 1 MG tablet Take 1 tablet (1 mg total) by mouth at bedtime.  30 tablet  0  . conjugated estrogens (PREMARIN) vaginal cream 1 g pv qd for 2 weeks then decrease to every other day.  Wean down to 0.5 g  2-3 days a week if possible  42.5 g  3  . cyclobenzaprine (FLEXERIL) 10 MG tablet Take 1 tablet (10 mg total) by mouth 2 (two) times daily as needed for muscle spasms.  180 tablet  0  . docusate sodium 100 MG CAPS Take 100 mg by mouth 2 (two) times daily.  10 capsule  0  . EPINEPHrine (EPIPEN) 0.3 mg/0.3 mL DEVI Inject 0.3 mLs (0.3 mg total) into the muscle once.  1 Device  2  . ferrous sulfate 325 (65 FE) MG tablet Take 1 tablet (325 mg total) by mouth 3 (three) times daily after meals.     3  . hydrochlorothiazide (HYDRODIURIL) 25 MG tablet Take 25 mg by mouth daily.      . hyoscyamine (LEVSIN/SL) 0.125 MG SL tablet Place 1 tablet (0.125 mg total) under the tongue every 4 (four) hours as needed.  30 tablet  0  . meloxicam (MOBIC) 15 MG tablet Take 1 tablet (15 mg total) by mouth daily.  90 tablet  1  . methylphenidate (RITALIN) 10 MG tablet Take 10 mg by mouth 2 (two) times daily with breakfast and lunch.      . Multiple Vitamin (MULTIVITAMIN WITH MINERALS) TABS tablet Take 1 tablet by mouth daily.      . niacin (GNP NIACIN) 250 MG tablet Take 500 mg by mouth daily with breakfast.       . Omega-3 Fatty Acids (FISH OIL) 1200 MG CAPS Take 2,400 capsules by mouth daily.      Marland Kitchen OVER THE COUNTER MEDICATION Take 2 tablets by mouth daily. TUMERIC      . polyethylene glycol (MIRALAX / GLYCOLAX) packet Take 17 g by mouth 2 (two) times daily.  14 each  0  . pregabalin (LYRICA) 150 MG capsule Take 150 mg by mouth 2 (two) times daily.      . pregabalin (LYRICA) 150 MG capsule Take 2 capsules (300 mg total) by mouth daily.  180 capsule  2  . traMADol (ULTRAM) 50 MG tablet Take 1 tablet (50 mg total) by mouth every 6 (six) hours as needed.  120 tablet  0  . celecoxib (CELEBREX) 200 MG capsule Take 200 mg by mouth 2 (two) times daily.      Marland Kitchen ezetimibe (ZETIA) 10 MG tablet Take 1 tablet (10 mg total) by mouth daily.  30 tablet  2  . HYDROcodone-acetaminophen (NORCO) 7.5-325 MG per tablet Take 1-2 tablets by mouth every 4 (four) hours as needed for moderate pain.  100 tablet  0   No current facility-administered medications on file prior to visit.   She is allergic to cefuroxime axetil; metaxalone; simvastatin; tetracycline; erythromycin; and sulfonamide derivatives.  Review of Systems  Review of Systems  Constitutional: Negative for activity change, appetite change and fatigue.  HENT: Negative for hearing loss, congestion, tinnitus and ear discharge.   Eyes: Negative for visual disturbance  (see optho q1y -- vision corrected to 20/20 with glasses).  Respiratory: Negative for cough, chest tightness and shortness of breath.   Cardiovascular: Negative for chest pain, palpitations and leg swelling.  Gastrointestinal: Negative for abdominal pain, diarrhea, constipation and  abdominal distention.  Genitourinary: Negative for urgency, frequency, decreased urine volume and difficulty urinating.  Musculoskeletal: Negative for back pain, arthralgias and gait problem.  Skin: Negative for color change, pallor and rash.  Neurological: Negative for dizziness, light-headedness, numbness and headaches.  Hematological: Negative for adenopathy. Does not bruise/bleed easily.  Psychiatric/Behavioral: Negative for suicidal ideas, confusion, sleep disturbance, self-injury, dysphoric mood, decreased concentration and agitation.  Pt is able to read and write and can do all ADLs No risk for falling No abuse/ violence in home      Objective:     Vision by Snellen chart: opth   Body mass index is 33.42 kg/(m^2). BP 118/62  Pulse 92  Temp(Src) 98.4 F (36.9 C) (Oral)  Ht 4\' 8"  (1.422 m)  Wt 149 lb (67.586 kg)  BMI 33.42 kg/m2  SpO2 96%  BP 118/62  Pulse 92  Temp(Src) 98.4 F (36.9 C) (Oral)  Ht 4\' 8"  (1.422 m)  Wt 149 lb (67.586 kg)  BMI 33.42 kg/m2  SpO2 96% General appearance: alert, cooperative, appears stated age and no distress Head: Normocephalic, without obvious abnormality, atraumatic Eyes: conjunctivae/corneas clear. PERRL, EOM's intact. Fundi benign. Ears: normal TM's and external ear canals both ears Nose: Nares normal. Septum midline. Mucosa normal. No drainage or sinus tenderness. Throat: lips, mucosa, and tongue normal; teeth and gums normal Neck: no adenopathy, no carotid bruit, no JVD, supple, symmetrical, trachea midline and thyroid not enlarged, symmetric, no tenderness/mass/nodules Back: symmetric, no curvature. ROM normal. No CVA tenderness. Lungs: clear to  auscultation bilaterally Breasts: normal appearance, no masses or tenderness Heart: regular rate and rhythm, S1, S2 normal, no murmur, click, rub or gallop Abdomen: soft, non-tender; bowel sounds normal; no masses,  no organomegaly Pelvic: not indicated; status post hysterectomy, negative ROS Extremities: extremities normal, atraumatic, no cyanosis or edema Pulses: 2+ and symmetric Skin: Skin color, texture, turgor normal. No rashes or lesions Lymph nodes: Cervical, supraclavicular, and axillary nodes normal. Neurologic: Alert and oriented X 3, normal strength and tone. Normal symmetric reflexes. Normal coordination and gait Psych-no depression, no anxiety      Assessment:     cpe      Plan:     During the course of the visit the patient was educated and counseled about appropriate screening and preventive services including:    Pneumococcal vaccine   Influenza vaccine  Td vaccine  Screening mammography  Bone densitometry screening  Colorectal cancer screening  Diabetes screening  Glaucoma screening  Advanced directives: has an advanced directive - a copy HAS NOT been provided.  Diet review for nutrition referral? Yes ____  Not Indicated _x___   Patient Instructions (the written plan) was given to the patient.  Medicare Attestation I have personally reviewed: The patient's medical and social history Their use of alcohol, tobacco or illicit drugs Their current medications and supplements The patient's functional ability including ADLs,fall risks, home safety risks, cognitive, and hearing and visual impairment Diet and physical activities Evidence for depression or mood disorders  The patient's weight, height, BMI, and visual acuity have been recorded in the chart.  I have made referrals, counseling, and provided education to the patient based on review of the above and I have provided the patient with a written personalized care plan for preventive services.      Garnet Koyanagi, DO   03/15/2013

## 2013-03-15 NOTE — Telephone Encounter (Signed)
Unable to reach pre visit.  

## 2013-03-15 NOTE — Progress Notes (Signed)
Pre-visit discussion using our clinic review tool. No additional management support is needed unless otherwise documented below in the visit note.   Samples of nexium given to pt. Lot # [id number] Exp 02/2015

## 2013-03-15 NOTE — Patient Instructions (Signed)

## 2013-03-15 NOTE — Progress Notes (Signed)
   Subjective:    Patient ID: [name], female    DOB: 1950-06-19, 63 y.o.   MRN: [id number]  HPI    Review of Systems     Objective:   Physical Exam        Assessment & Plan:  1. GERD (gastroesophageal reflux disease) Gi referral - esomeprazole (NEXIUM) 40 MG capsule; Take 1 capsule (40 mg total) by mouth daily.  Dispense: 30 capsule; Refill: 3 - Ambulatory referral to Gastroenterology  2. Insomnia  - zolpidem (AMBIEN) 5 MG tablet; Take 1 tablet (5 mg total) by mouth at bedtime as needed for sleep.  Dispense: 45 tablet; Refill: 0  3. Need for shingles vaccine  - zoster vaccine live, PF, (ZOSTAVAX) 63149 UNT/0.65ML injection; Inject 19,400 Units into the skin once.  Dispense: 1 vial; Refill: 0  4. High risk sexual behavior Check labs - HIV antibody - HSV 2 antibody, IgG - RPR - GC/chlamydia probe amp, urine  5. Preventative health care   6. Other and unspecified hyperlipidemia  - Hepatic function panel - Lipid panel - POCT urinalysis dipstick - TSH - HSV 2 antibody, IgG  7. HTN (hypertension) stable - Basic metabolic panel - CBC with Differential - POCT urinalysis dipstick - TSH - HSV 2 antibody, IgG  8. Hyperglycemia  - Hemoglobin A1c - POCT urinalysis dipstick - TSH - HSV 2 antibody, IgG  9. Need for prophylactic vaccination against Streptococcus pneumoniae (pneumococcus)  - Pneumococcal polysaccharide vaccine 23-valent greater than or equal to 2yo subcutaneous/IM

## 2013-03-15 NOTE — Telephone Encounter (Signed)
Rx faxed to [facility].

## 2013-03-15 NOTE — Telephone Encounter (Signed)
error 

## 2013-03-15 NOTE — Telephone Encounter (Signed)
[employer] called stating we need to send zostavax to [facility]. They only fill prescriptions for [facility] and family members.

## 2013-03-16 NOTE — Addendum Note (Signed)
Addended by: Modena Morrow D on: 03/16/2013 09:41 AM   Modules accepted: Orders

## 2013-03-16 NOTE — Addendum Note (Signed)
Addended by: Modena Morrow D on: 03/16/2013 10:03 AM   Modules accepted: Orders

## 2013-03-17 LAB — GC/CHLAMYDIA PROBE AMP, URINE
CHLAMYDIA, SWAB/URINE, PCR: NEGATIVE
GC PROBE AMP, URINE: NEGATIVE

## 2013-03-20 ENCOUNTER — Other Ambulatory Visit: Payer: Self-pay | Admitting: Family Medicine

## 2013-03-20 ENCOUNTER — Encounter: Payer: Self-pay | Admitting: Family Medicine

## 2013-03-20 DIAGNOSIS — G894 Chronic pain syndrome: Secondary | ICD-10-CM

## 2013-03-20 MED ORDER — TRAMADOL HCL 50 MG PO TABS: 50.0000 mg | ORAL_TABLET | Freq: Four times a day (QID) | ORAL | Status: DC | PRN

## 2013-03-22 ENCOUNTER — Encounter: Payer: Self-pay | Admitting: Family Medicine

## 2013-03-23 ENCOUNTER — Other Ambulatory Visit: Payer: Self-pay | Admitting: Family Medicine

## 2013-03-23 DIAGNOSIS — G4700 Insomnia, unspecified: Secondary | ICD-10-CM

## 2013-03-23 DIAGNOSIS — F988 Other specified behavioral and emotional disorders with onset usually occurring in childhood and adolescence: Secondary | ICD-10-CM

## 2013-03-23 MED ORDER — METHYLPHENIDATE HCL 10 MG PO TABS: 10.0000 mg | ORAL_TABLET | Freq: Two times a day (BID) | ORAL | Status: DC

## 2013-03-23 MED ORDER — HYDROCHLOROTHIAZIDE 25 MG PO TABS: 25.0000 mg | ORAL_TABLET | Freq: Every day | ORAL | Status: DC

## 2013-03-23 MED ORDER — CLONAZEPAM 1 MG PO TABS: 1.0000 mg | ORAL_TABLET | Freq: Every day | ORAL | Status: DC

## 2013-03-23 MED ORDER — COLESEVELAM HCL 625 MG PO TABS: 1875.0000 mg | ORAL_TABLET | Freq: Two times a day (BID) | ORAL | Status: DC

## 2013-03-23 NOTE — Telephone Encounter (Signed)
Klonopin was sent to the local pharmacy on 03/15/13 #30. Please advise      KP

## 2013-03-27 ENCOUNTER — Other Ambulatory Visit: Payer: Self-pay | Admitting: Family Medicine

## 2013-03-27 ENCOUNTER — Encounter: Payer: Self-pay | Admitting: Family Medicine

## 2013-04-20 ENCOUNTER — Encounter: Payer: Self-pay | Admitting: Family Medicine

## 2013-04-21 ENCOUNTER — Encounter: Payer: Self-pay | Admitting: Family Medicine

## 2013-04-23 ENCOUNTER — Encounter: Payer: Self-pay | Admitting: Family Medicine

## 2013-04-24 ENCOUNTER — Other Ambulatory Visit: Payer: Self-pay | Admitting: Family Medicine

## 2013-04-24 DIAGNOSIS — R079 Chest pain, unspecified: Secondary | ICD-10-CM

## 2013-04-24 MED ORDER — GABAPENTIN 300 MG PO CAPS: 300.0000 mg | ORAL_CAPSULE | Freq: Three times a day (TID) | ORAL | Status: DC

## 2013-04-24 NOTE — Telephone Encounter (Signed)
neurontin 300 mg #270 1 po tid  3 refills

## 2013-04-25 ENCOUNTER — Encounter: Payer: Self-pay | Admitting: Family Medicine

## 2013-05-01 ENCOUNTER — Other Ambulatory Visit: Payer: Self-pay | Admitting: Family Medicine

## 2013-05-01 ENCOUNTER — Encounter: Payer: Self-pay | Admitting: Family Medicine

## 2013-05-01 DIAGNOSIS — G894 Chronic pain syndrome: Secondary | ICD-10-CM

## 2013-05-01 DIAGNOSIS — G4700 Insomnia, unspecified: Secondary | ICD-10-CM

## 2013-05-01 MED ORDER — TRAMADOL HCL 50 MG PO TABS: 50.0000 mg | ORAL_TABLET | Freq: Four times a day (QID) | ORAL | Status: DC | PRN

## 2013-05-01 MED ORDER — ZOLPIDEM TARTRATE 5 MG PO TABS: 5.0000 mg | ORAL_TABLET | Freq: Every evening | ORAL | Status: DC | PRN

## 2013-05-01 NOTE — Telephone Encounter (Signed)
Last seen  03/15/13 and Tramadol filled 03/20/13 #60, Ambien filled 03/15/13 #45 UDS 02/02/13 low risk   Please advise     KP

## 2013-05-04 ENCOUNTER — Other Ambulatory Visit: Payer: Self-pay | Admitting: Gastroenterology

## 2013-05-04 DIAGNOSIS — R1310 Dysphagia, unspecified: Secondary | ICD-10-CM

## 2013-05-10 ENCOUNTER — Ambulatory Visit
Admission: RE | Admit: 2013-05-10 | Discharge: 2013-05-10 | Disposition: A | Discharge status: Home / Self Care | Payer: Commercial Managed Care - HMO | Source: Ambulatory Visit | Attending: Gastroenterology | Admitting: Gastroenterology

## 2013-05-10 DIAGNOSIS — R1310 Dysphagia, unspecified: Secondary | ICD-10-CM

## 2013-05-15 ENCOUNTER — Encounter: Payer: Self-pay | Admitting: Family Medicine

## 2013-05-15 ENCOUNTER — Telehealth: Payer: Self-pay | Admitting: Family Medicine

## 2013-05-15 DIAGNOSIS — F988 Other specified behavioral and emotional disorders with onset usually occurring in childhood and adolescence: Secondary | ICD-10-CM

## 2013-05-15 NOTE — Telephone Encounter (Signed)
Please advise      KP 

## 2013-05-15 NOTE — Telephone Encounter (Signed)
Caller name:Bellany Relation to PQ:[id number] Call back number:[phone] Pharmacy:  Reason for call: to request a new Rx for (RITALIN). Patient states that she never got it filled in march because it was too much money at [facility]. Patient states that she now has the money to pay for it but [facility] states that it will take about ten days to send it. Patient would like to just get it refilled at [facility].

## 2013-05-15 NOTE — Telephone Encounter (Signed)
Ok to refill as long as other rx not filled

## 2013-05-16 MED ORDER — METHYLPHENIDATE HCL 10 MG PO TABS: 10.0000 mg | ORAL_TABLET | Freq: Two times a day (BID) | ORAL | Status: DC

## 2013-05-16 NOTE — Telephone Encounter (Signed)
Rx printed and VM left advising the patient the Rx is ready for pick up.     KP

## 2013-06-01 ENCOUNTER — Encounter: Payer: Self-pay | Admitting: Family Medicine

## 2013-06-01 DIAGNOSIS — G894 Chronic pain syndrome: Secondary | ICD-10-CM

## 2013-06-02 ENCOUNTER — Encounter: Payer: Self-pay | Admitting: Family Medicine

## 2013-06-02 MED ORDER — TRAMADOL HCL 50 MG PO TABS: 50.0000 mg | ORAL_TABLET | Freq: Four times a day (QID) | ORAL | Status: DC | PRN

## 2013-06-11 ENCOUNTER — Encounter: Payer: Self-pay | Admitting: Family Medicine

## 2013-06-11 DIAGNOSIS — G4700 Insomnia, unspecified: Secondary | ICD-10-CM

## 2013-06-11 DIAGNOSIS — E785 Hyperlipidemia, unspecified: Secondary | ICD-10-CM

## 2013-06-12 MED ORDER — ZOLPIDEM TARTRATE 5 MG PO TABS: 5.0000 mg | ORAL_TABLET | Freq: Every evening | ORAL | Status: DC | PRN

## 2013-06-26 ENCOUNTER — Encounter: Payer: Self-pay | Admitting: Family Medicine

## 2013-06-27 ENCOUNTER — Other Ambulatory Visit (INDEPENDENT_AMBULATORY_CARE_PROVIDER_SITE_OTHER): Payer: Commercial Managed Care - HMO

## 2013-06-27 DIAGNOSIS — E785 Hyperlipidemia, unspecified: Secondary | ICD-10-CM

## 2013-06-27 MED ORDER — HYOSCYAMINE SULFATE 0.125 MG SL SUBL: 0.1250 mg | SUBLINGUAL_TABLET | SUBLINGUAL | Status: DC | PRN

## 2013-07-02 ENCOUNTER — Encounter: Payer: Self-pay | Admitting: Family Medicine

## 2013-07-02 DIAGNOSIS — G894 Chronic pain syndrome: Secondary | ICD-10-CM

## 2013-07-03 ENCOUNTER — Encounter: Payer: Self-pay | Admitting: Family Medicine

## 2013-07-03 MED ORDER — TRAMADOL HCL 50 MG PO TABS: 50.0000 mg | ORAL_TABLET | Freq: Four times a day (QID) | ORAL | Status: DC | PRN

## 2013-07-25 ENCOUNTER — Encounter: Payer: Self-pay | Admitting: Family Medicine

## 2013-07-25 DIAGNOSIS — G894 Chronic pain syndrome: Secondary | ICD-10-CM

## 2013-07-25 DIAGNOSIS — F988 Other specified behavioral and emotional disorders with onset usually occurring in childhood and adolescence: Secondary | ICD-10-CM

## 2013-07-25 DIAGNOSIS — G4700 Insomnia, unspecified: Secondary | ICD-10-CM

## 2013-07-26 ENCOUNTER — Ambulatory Visit (INDEPENDENT_AMBULATORY_CARE_PROVIDER_SITE_OTHER): Payer: Commercial Managed Care - HMO | Admitting: Family Medicine

## 2013-07-26 ENCOUNTER — Encounter: Payer: Self-pay | Admitting: Family Medicine

## 2013-07-26 VITALS — BP 140/84 | HR 90 | Temp 98.5°F | Resp 17 | Wt 146.4 lb

## 2013-07-26 DIAGNOSIS — A6004 Herpesviral vulvovaginitis: Secondary | ICD-10-CM

## 2013-07-26 MED ORDER — TRAMADOL HCL 50 MG PO TABS: 50.0000 mg | ORAL_TABLET | Freq: Four times a day (QID) | ORAL | Status: DC | PRN

## 2013-07-26 MED ORDER — VALACYCLOVIR HCL 1 G PO TABS: 1000.0000 mg | ORAL_TABLET | Freq: Two times a day (BID) | ORAL | Status: DC

## 2013-07-26 MED ORDER — ZOLPIDEM TARTRATE 5 MG PO TABS: 5.0000 mg | ORAL_TABLET | Freq: Every evening | ORAL | Status: DC | PRN

## 2013-07-26 MED ORDER — METHYLPHENIDATE HCL 10 MG PO TABS: 10.0000 mg | ORAL_TABLET | Freq: Two times a day (BID) | ORAL | Status: DC

## 2013-07-26 NOTE — Patient Instructions (Signed)
Follow up as needed Start the Valtrex twice daily x10 days for herpes outbreak Call with any questions or concerns This will be ok!

## 2013-07-26 NOTE — Assessment & Plan Note (Signed)
New.  Reviewed pt's HSV labs confirming she does have virus present.  PE consistent w/ primary outbreak.  Start valtrex.  Pt very upset- reassurance and information on dx and ways to decrease transmission to partner.  Pt appreciative.

## 2013-07-26 NOTE — Progress Notes (Signed)
   Subjective:    Patient ID: [name], female    DOB: [birth year], 63 y.o.   MRN: [id number]  HPI 'raw spot on bottom'- 1st appeared 7 days ago as a blister.  Painful to sit.  No drainage.  Treating w/ Neosporin x5 days w/o improvement.  Area is located on perineum and 'now have 2 bumps on my rectum that are tender'.  Pt has + blood test for herpes but has never had outbreak.   Review of Systems For ROS see HPI     Objective:   Physical Exam  Vitals reviewed. Constitutional: She appears well-developed and well-nourished.  Genitourinary:  Unroofed ulceration R perineum w/ 2 smaller vesicles inferior to ulceration- very TTP  Psychiatric:  Anxious, tearful          Assessment & Plan:

## 2013-07-26 NOTE — Progress Notes (Signed)
Pre visit review using our clinic review tool, if applicable. No additional management support is needed unless otherwise documented below in the visit note. 

## 2013-08-08 ENCOUNTER — Telehealth: Payer: Self-pay | Admitting: Family Medicine

## 2013-08-08 DIAGNOSIS — M545 Low back pain: Secondary | ICD-10-CM

## 2013-08-08 NOTE — Telephone Encounter (Signed)
Caller name: Genie Relation to pt: [place]  Call back number: [phone] and (f) [phone] Pharmacy:  Reason for call: referral for low back pain. Pt has [employer] and is in the office today for appointment.

## 2013-08-08 NOTE — Telephone Encounter (Signed)
Ref placed.      KP 

## 2013-08-09 ENCOUNTER — Encounter: Payer: Self-pay | Admitting: Family Medicine

## 2013-08-09 MED ORDER — VALACYCLOVIR HCL 1 G PO TABS: 1000.0000 mg | ORAL_TABLET | Freq: Every day | ORAL | Status: DC

## 2013-08-15 ENCOUNTER — Encounter: Payer: Self-pay | Admitting: Family Medicine

## 2013-08-15 DIAGNOSIS — M545 Low back pain: Secondary | ICD-10-CM

## 2013-08-16 NOTE — Addendum Note (Signed)
Addended by: Ewing Schlein on: 08/16/2013 08:04 AM   Modules accepted: Orders

## 2013-08-21 ENCOUNTER — Encounter: Payer: Self-pay | Admitting: Family Medicine

## 2013-08-22 ENCOUNTER — Encounter: Payer: Self-pay | Admitting: Family Medicine

## 2013-08-23 NOTE — Telephone Encounter (Signed)
Last seen 03/15/13. And med's filled in March. Please advise     KP

## 2013-08-24 ENCOUNTER — Encounter: Payer: Self-pay | Admitting: Family Medicine

## 2013-08-24 DIAGNOSIS — G4700 Insomnia, unspecified: Secondary | ICD-10-CM

## 2013-08-24 DIAGNOSIS — F988 Other specified behavioral and emotional disorders with onset usually occurring in childhood and adolescence: Secondary | ICD-10-CM

## 2013-08-24 DIAGNOSIS — G894 Chronic pain syndrome: Secondary | ICD-10-CM

## 2013-08-24 MED ORDER — TRAMADOL HCL 50 MG PO TABS: 50.0000 mg | ORAL_TABLET | Freq: Four times a day (QID) | ORAL | Status: DC | PRN

## 2013-08-24 MED ORDER — CLONAZEPAM 1 MG PO TABS: 1.0000 mg | ORAL_TABLET | Freq: Every day | ORAL | Status: DC

## 2013-08-24 MED ORDER — METHYLPHENIDATE HCL 10 MG PO TABS: 10.0000 mg | ORAL_TABLET | Freq: Two times a day (BID) | ORAL | Status: DC

## 2013-08-25 ENCOUNTER — Telehealth: Payer: Self-pay | Admitting: Family Medicine

## 2013-08-25 DIAGNOSIS — M545 Low back pain: Secondary | ICD-10-CM

## 2013-08-25 NOTE — Telephone Encounter (Signed)
Ref placed.      KP 

## 2013-08-25 NOTE — Telephone Encounter (Signed)
Caller name: Gene Relation to pt: Other/ [facility] Call back number: [phone]   Reason for call: Pt insurance does not cover this particular doctor office and the patient would like a referral to another chiropractor. As per [facility] the type of x ray needed Lumbar A to P and lateral.

## 2013-08-25 NOTE — Telephone Encounter (Signed)
Ok to put referral in 

## 2013-08-28 ENCOUNTER — Encounter: Payer: Self-pay | Admitting: Family Medicine

## 2013-08-28 NOTE — Telephone Encounter (Signed)
im pretty sure you did this already, correct?

## 2013-08-28 NOTE — Telephone Encounter (Signed)
Yes, the last time I checked they were working on her appointment.     KP

## 2013-08-29 ENCOUNTER — Other Ambulatory Visit: Payer: Self-pay | Admitting: Family Medicine

## 2013-08-29 ENCOUNTER — Ambulatory Visit
Admission: RE | Admit: 2013-08-29 | Discharge: 2013-08-29 | Disposition: A | Discharge status: Home / Self Care | Payer: Commercial Managed Care - HMO | Source: Ambulatory Visit | Attending: Family Medicine | Admitting: Family Medicine

## 2013-08-29 ENCOUNTER — Encounter: Payer: Self-pay | Admitting: Family Medicine

## 2013-08-29 DIAGNOSIS — M545 Low back pain: Secondary | ICD-10-CM

## 2013-08-29 DIAGNOSIS — G4700 Insomnia, unspecified: Secondary | ICD-10-CM

## 2013-08-29 MED ORDER — ZOLPIDEM TARTRATE 5 MG PO TABS: 5.0000 mg | ORAL_TABLET | Freq: Every evening | ORAL | Status: DC | PRN

## 2013-08-29 NOTE — Telephone Encounter (Signed)
im not sure whats goint on with the referral but who ever is doing the referral s needs to call her and tell her whats going on--- the xrays have been ordered she needs to go get them done

## 2013-09-20 ENCOUNTER — Encounter: Payer: Self-pay | Admitting: Family Medicine

## 2013-09-21 ENCOUNTER — Encounter: Payer: Self-pay | Admitting: Family Medicine

## 2013-09-21 ENCOUNTER — Other Ambulatory Visit: Payer: Self-pay | Admitting: Family Medicine

## 2013-09-21 DIAGNOSIS — G4700 Insomnia, unspecified: Secondary | ICD-10-CM

## 2013-09-21 DIAGNOSIS — G894 Chronic pain syndrome: Secondary | ICD-10-CM

## 2013-09-21 DIAGNOSIS — M62838 Other muscle spasm: Secondary | ICD-10-CM

## 2013-09-22 MED ORDER — CYCLOBENZAPRINE HCL 10 MG PO TABS: 10.0000 mg | ORAL_TABLET | Freq: Two times a day (BID) | ORAL | Status: DC | PRN

## 2013-09-22 MED ORDER — ZOLPIDEM TARTRATE 5 MG PO TABS: 5.0000 mg | ORAL_TABLET | Freq: Every evening | ORAL | Status: DC | PRN

## 2013-09-22 MED ORDER — TRAMADOL HCL 50 MG PO TABS: 50.0000 mg | ORAL_TABLET | Freq: Four times a day (QID) | ORAL | Status: DC | PRN

## 2013-09-29 ENCOUNTER — Encounter: Payer: Self-pay | Admitting: Family Medicine

## 2013-09-29 DIAGNOSIS — G894 Chronic pain syndrome: Secondary | ICD-10-CM

## 2013-09-29 DIAGNOSIS — M62838 Other muscle spasm: Secondary | ICD-10-CM

## 2013-09-29 DIAGNOSIS — G4700 Insomnia, unspecified: Secondary | ICD-10-CM

## 2013-09-29 DIAGNOSIS — F988 Other specified behavioral and emotional disorders with onset usually occurring in childhood and adolescence: Secondary | ICD-10-CM

## 2013-09-29 NOTE — Telephone Encounter (Signed)
Patient stated that [facility] never got the order for her medication. Please call.

## 2013-09-30 ENCOUNTER — Encounter: Payer: Self-pay | Admitting: Family Medicine

## 2013-10-02 ENCOUNTER — Encounter: Payer: Self-pay | Admitting: Family Medicine

## 2013-10-02 MED ORDER — HYOSCYAMINE SULFATE 0.125 MG SL SUBL: 0.1250 mg | SUBLINGUAL_TABLET | SUBLINGUAL | Status: DC | PRN

## 2013-10-02 MED ORDER — HYDROCHLOROTHIAZIDE 25 MG PO TABS: 25.0000 mg | ORAL_TABLET | Freq: Every day | ORAL | Status: DC

## 2013-10-02 MED ORDER — CLONAZEPAM 1 MG PO TABS: 1.0000 mg | ORAL_TABLET | Freq: Every day | ORAL | Status: DC

## 2013-10-02 MED ORDER — METHYLPHENIDATE HCL 10 MG PO TABS: 10.0000 mg | ORAL_TABLET | Freq: Three times a day (TID) | ORAL | Status: DC

## 2013-10-02 MED ORDER — MELOXICAM 15 MG PO TABS
15.0000 mg | ORAL_TABLET | Freq: Every day | ORAL | Status: DC
Start: 2013-10-02 — End: 2013-10-03

## 2013-10-02 NOTE — Telephone Encounter (Signed)
We took care of that last week

## 2013-10-02 NOTE — Telephone Encounter (Signed)
This message has already been handled.     KP

## 2013-10-02 NOTE — Telephone Encounter (Addendum)
Discussed with patient and I made her aware no pharmacy was selected and the med's were sent on 09/22/13 to the local pharmacy.She said it cost too much at the local pharmacy and someone will need to pay for it, I discussed with Dr.Lowne and she said all of the med's are generic. I called the pharmacy and the cost of the three med's are 28.10, I made the patient aware and she agreed to pick the up, she has the address and will come in for her Ritalin and then she started telling me her med's did not work, so I scheduled her an apt for tomorrow. All of the rest of the med's went to [facility] and her pharmacies have been updated in her chart    KP

## 2013-10-02 NOTE — Telephone Encounter (Signed)
Called patient and [facility] on VM.     KP

## 2013-10-03 ENCOUNTER — Ambulatory Visit (INDEPENDENT_AMBULATORY_CARE_PROVIDER_SITE_OTHER): Payer: Commercial Managed Care - HMO | Admitting: Family Medicine

## 2013-10-03 ENCOUNTER — Encounter: Payer: Self-pay | Admitting: Family Medicine

## 2013-10-03 VITALS — BP 112/43 | HR 74 | Temp 98.3°F | Wt 147.0 lb

## 2013-10-03 DIAGNOSIS — F329 Major depressive disorder, single episode, unspecified: Secondary | ICD-10-CM

## 2013-10-03 DIAGNOSIS — E2839 Other primary ovarian failure: Secondary | ICD-10-CM

## 2013-10-03 DIAGNOSIS — F988 Other specified behavioral and emotional disorders with onset usually occurring in childhood and adolescence: Secondary | ICD-10-CM

## 2013-10-03 DIAGNOSIS — F32A Depression, unspecified: Secondary | ICD-10-CM

## 2013-10-03 DIAGNOSIS — R5383 Other fatigue: Secondary | ICD-10-CM

## 2013-10-03 DIAGNOSIS — R5381 Other malaise: Secondary | ICD-10-CM

## 2013-10-03 DIAGNOSIS — A6000 Herpesviral infection of urogenital system, unspecified: Secondary | ICD-10-CM

## 2013-10-03 DIAGNOSIS — F3289 Other specified depressive episodes: Secondary | ICD-10-CM

## 2013-10-03 LAB — POCT URINALYSIS DIPSTICK
Bilirubin, UA: NEGATIVE
Blood, UA: NEGATIVE
Glucose, UA: NEGATIVE
Ketones, UA: NEGATIVE
LEUKOCYTES UA: NEGATIVE
NITRITE UA: NEGATIVE
Protein, UA: NEGATIVE
Spec Grav, UA: 1.01
UROBILINOGEN UA: 0.2
pH, UA: 7

## 2013-10-03 MED ORDER — VALACYCLOVIR HCL 500 MG PO TABS: 500.0000 mg | ORAL_TABLET | Freq: Every day | ORAL | Status: DC

## 2013-10-03 MED ORDER — FLUOXETINE HCL 20 MG PO TABS
20.0000 mg | ORAL_TABLET | Freq: Every day | ORAL | Status: DC
Start: 2013-10-03 — End: 2013-12-26

## 2013-10-03 NOTE — Progress Notes (Signed)
Pre visit review using our clinic review tool, if applicable. No additional management support is needed unless otherwise documented below in the visit note. 

## 2013-10-03 NOTE — Patient Instructions (Signed)

## 2013-10-03 NOTE — Progress Notes (Signed)
   Subjective:    Patient ID: [name], female    DOB: 12/31/1950, 63 y.o.   MRN: [id number]  HPI Pt here c/o extreme fatigue and depression.  She had to put her dog down a few months ago and she has been very sad.  Teary eyed in office.     Review of Systems As above    Objective:   Physical Exam  BP 112/43  Pulse 74  Temp(Src) 98.3 F (36.8 C) (Oral)  Wt 147 lb 0.8 oz (66.7 kg)  SpO2 100% General appearance: alert, cooperative, appears stated age and no distress Throat: lips, mucosa, and tongue normal; teeth and gums normal Neck: no adenopathy, supple, symmetrical, trachea midline and thyroid not enlarged, symmetric, no tenderness/mass/nodules Lungs: clear to auscultation bilaterally Heart: regular rate and rhythm, S1, S2 normal, no murmur, click, rub or gallop Neurologic: Alert and oriented X 3, normal strength and tone. Normal symmetric reflexes. Normal coordination and gait Pt is not suicidal   Assessment & Plan:  1. Depression   - FLUoxetine (PROZAC) 20 MG tablet; Take 1 tablet (20 mg total) by mouth daily.  Dispense: 30 tablet; Refill: 3 - Basic metabolic panel - CBC with Differential - Hepatic function panel - POCT urinalysis dipstick - TSH - Vitamin B12  2. Other malaise and fatigue Check labs - Basic metabolic panel - CBC with Differential - Hepatic function panel - POCT urinalysis dipstick - TSH - Vitamin B12  3. Herpes genitalia Refill meds - valACYclovir (VALTREX) 500 MG tablet; Take 1 tablet (500 mg total) by mouth daily.  Dispense: 90 tablet; Refill: 3

## 2013-10-04 LAB — CBC WITH DIFFERENTIAL/PLATELET
BASOS PCT: 0.4 % (ref 0.0–3.0)
Basophils Absolute: 0 10*3/uL (ref 0.0–0.1)
Eosinophils Absolute: 0.3 10*3/uL (ref 0.0–0.7)
Eosinophils Relative: 2.9 % (ref 0.0–5.0)
HCT: 41.3 % (ref 36.0–46.0)
Hemoglobin: 13.7 g/dL (ref 12.0–15.0)
Lymphocytes Relative: 26.4 % (ref 12.0–46.0)
Lymphs Abs: 2.8 10*3/uL (ref 0.7–4.0)
MCHC: 33.2 g/dL (ref 30.0–36.0)
MCV: 86.5 fl (ref 78.0–100.0)
MONO ABS: 0.6 10*3/uL (ref 0.1–1.0)
Monocytes Relative: 5.8 % (ref 3.0–12.0)
NEUTROS PCT: 64.5 % (ref 43.0–77.0)
Neutro Abs: 6.8 10*3/uL (ref 1.4–7.7)
Platelets: 356 10*3/uL (ref 150.0–400.0)
RBC: 4.77 Mil/uL (ref 3.87–5.11)
RDW: 13.8 % (ref 11.5–15.5)
WBC: 10.5 10*3/uL (ref 4.0–10.5)

## 2013-10-04 LAB — HEPATIC FUNCTION PANEL
ALT: 35 U/L (ref 0–35)
AST: 30 U/L (ref 0–37)
Albumin: 4.6 g/dL (ref 3.5–5.2)
Alkaline Phosphatase: 75 U/L (ref 39–117)
BILIRUBIN DIRECT: 0 mg/dL (ref 0.0–0.3)
TOTAL PROTEIN: 7.8 g/dL (ref 6.0–8.3)
Total Bilirubin: 0.6 mg/dL (ref 0.2–1.2)

## 2013-10-04 LAB — VITAMIN B12: VITAMIN B 12: 1150 pg/mL — AB (ref 211–911)

## 2013-10-04 LAB — BASIC METABOLIC PANEL
BUN: 13 mg/dL (ref 6–23)
CHLORIDE: 103 meq/L (ref 96–112)
CO2: 28 meq/L (ref 19–32)
CREATININE: 1.1 mg/dL (ref 0.4–1.2)
Calcium: 9.2 mg/dL (ref 8.4–10.5)
GFR: 53.76 mL/min — ABNORMAL LOW (ref >60.00)
GLUCOSE: 97 mg/dL (ref 70–99)
Potassium: 4.3 mEq/L (ref 3.5–5.1)
Sodium: 136 mEq/L (ref 135–145)

## 2013-10-04 LAB — TSH: TSH: 1.24 u[IU]/mL (ref 0.35–4.50)

## 2013-10-05 ENCOUNTER — Encounter: Payer: Self-pay | Admitting: Family Medicine

## 2013-10-05 NOTE — Telephone Encounter (Signed)
If we had given her only 30 I would have done that but we gave her 7.  Have her call when she is in the beginning of the third month and we can give her one to mail to [place].

## 2013-10-05 NOTE — Telephone Encounter (Signed)
Ok to refill ritalin for 3 months She needs bmd Premarin cream normally does not help dryness--we can discuss this at next ov

## 2013-10-05 NOTE — Telephone Encounter (Signed)
She Picked up the Ritalin Rx and is requesting we mail it to [facility].   KP

## 2013-10-06 NOTE — Telephone Encounter (Signed)
Premarin will help dryness but not hot flashes.  We will treat hot flashes if they are really bothering her but should discuss at [facility]

## 2013-10-09 MED ORDER — METHYLPHENIDATE HCL 10 MG PO TABS: 10.0000 mg | ORAL_TABLET | Freq: Three times a day (TID) | ORAL | Status: DC

## 2013-10-09 NOTE — Telephone Encounter (Signed)
Not really sure what she is wanting me to do-- if she wants HRT she really needs ov

## 2013-10-10 ENCOUNTER — Ambulatory Visit
Admission: RE | Admit: 2013-10-10 | Discharge: 2013-10-10 | Disposition: A | Discharge status: Home / Self Care | Payer: Commercial Managed Care - HMO | Source: Ambulatory Visit | Attending: Family Medicine | Admitting: Family Medicine

## 2013-10-10 DIAGNOSIS — E2839 Other primary ovarian failure: Secondary | ICD-10-CM

## 2013-10-11 ENCOUNTER — Encounter: Payer: Self-pay | Admitting: Family Medicine

## 2013-10-12 NOTE — Telephone Encounter (Signed)
I'm not sure what she needs  

## 2013-10-18 ENCOUNTER — Telehealth: Payer: Self-pay | Admitting: Family Medicine

## 2013-10-18 NOTE — Telephone Encounter (Signed)
Called to get DX code, DOS and MD. So I will be able to do [employer] referral, awaiting return call

## 2013-10-19 ENCOUNTER — Encounter: Payer: Self-pay | Admitting: Family Medicine

## 2013-10-20 NOTE — Telephone Encounter (Signed)
Patient is requesting Lorazepam for spasms, however she is on klonopin and is taking Flexeril for spasms. Please advise if this is appropriate. The medication was last prescribed last year.     KP

## 2013-10-25 ENCOUNTER — Encounter: Payer: Self-pay | Admitting: Physician Assistant

## 2013-10-25 ENCOUNTER — Ambulatory Visit (INDEPENDENT_AMBULATORY_CARE_PROVIDER_SITE_OTHER): Payer: Commercial Managed Care - HMO | Admitting: Physician Assistant

## 2013-10-25 VITALS — BP 128/78 | HR 86 | Temp 99.0°F | Wt 147.0 lb

## 2013-10-25 DIAGNOSIS — H6121 Impacted cerumen, right ear: Secondary | ICD-10-CM

## 2013-10-25 DIAGNOSIS — H6691 Otitis media, unspecified, right ear: Secondary | ICD-10-CM

## 2013-10-25 MED ORDER — AZITHROMYCIN 250 MG PO TABS: ORAL_TABLET | ORAL | Status: DC

## 2013-10-25 NOTE — Progress Notes (Signed)
Patient presents to clinic today c/o "clogged" right ear x 2 weeks.  Patient endorses decreased hearing of affected ear.  Denies fever, malaise or URI symptoms.  Was recently treated at an UC for otitis.  Has attempted to clean ear with Q-tips.  Denis trauma or injury to ear.  Denies excess exposure to loud noise.   Past Medical History  Diagnosis Date  . ADD (attention deficit disorder)   . Depression   . GERD (gastroesophageal reflux disease)   . Hyperlipemia     "borderline"  . IBS (irritable bowel syndrome)     chronic constipation  . Fibromyalgia   . Osteoporosis   . Bronchitis     hx of  . Hepatitis     B  . Cataract of left eye     since birth  . Osteoarthritis     Current Outpatient Prescriptions on File Prior to Visit  Medication Sig Dispense Refill  . alendronate (FOSAMAX) 10 MG tablet Take 1 tablet (10 mg total) by mouth once a week. Take with a full glass of water on an empty stomach.  12 tablet  3  . ARIPiprazole (ABILIFY) 2 MG tablet Take 1 mg by mouth daily.      Marland Kitchen CALCIUM-MAGNESIUM-ZINC PO Take 2 capsules by mouth at bedtime.      . clonazePAM (KLONOPIN) 1 MG tablet Take 1 tablet (1 mg total) by mouth at bedtime.  90 tablet  0  . conjugated estrogens (PREMARIN) vaginal cream 1 g pv qd for 2 weeks then decrease to every other day.  Wean down to 0.5 g  2-3 days a week if possible  42.5 g  3  . cyclobenzaprine (FLEXERIL) 10 MG tablet Take 1 tablet (10 mg total) by mouth 2 (two) times daily as needed for muscle spasms.  180 tablet  0  . docusate sodium 100 MG CAPS Take 100 mg by mouth 2 (two) times daily.  10 capsule  0  . EPINEPHrine (EPIPEN) 0.3 mg/0.3 mL DEVI Inject 0.3 mLs (0.3 mg total) into the muscle once.  1 Device  2  . FLUoxetine (PROZAC) 20 MG tablet Take 1 tablet (20 mg total) by mouth daily.  30 tablet  3  . gabapentin (NEURONTIN) 300 MG capsule Take 1 capsule (300 mg total) by mouth 3 (three) times daily.  270 capsule  0  . hydrochlorothiazide  (HYDRODIURIL) 25 MG tablet Take 1 tablet (25 mg total) by mouth daily.  90 tablet  1  . HYDROcodone-acetaminophen (NORCO) 7.5-325 MG per tablet Take 1-2 tablets by mouth every 4 (four) hours as needed for moderate pain.  100 tablet  0  . hyoscyamine (LEVSIN/SL) 0.125 MG SL tablet Place 1 tablet (0.125 mg total) under the tongue every 4 (four) hours as needed.  90 tablet  1  . methylphenidate (RITALIN) 10 MG tablet Take 1 tablet (10 mg total) by mouth 3 (three) times daily with meals.  270 tablet  0  . Multiple Vitamin (MULTIVITAMIN WITH MINERALS) TABS tablet Take 1 tablet by mouth daily.      . niacin (GNP NIACIN) 250 MG tablet Take 500 mg by mouth daily with breakfast.       . Omega-3 Fatty Acids (FISH OIL) 1200 MG CAPS Take 2,400 capsules by mouth daily.      Marland Kitchen OVER THE COUNTER MEDICATION Take 2 tablets by mouth daily. TUMERIC      . polyethylene glycol (MIRALAX / GLYCOLAX) packet Take 17 g by mouth 2 (  two) times daily.  14 each  0  . traMADol (ULTRAM) 50 MG tablet Take 1 tablet (50 mg total) by mouth every 6 (six) hours as needed.  120 tablet  0  . valACYclovir (VALTREX) 500 MG tablet Take 1 tablet (500 mg total) by mouth daily.  90 tablet  3  . zolpidem (AMBIEN) 5 MG tablet Take 1 tablet (5 mg total) by mouth at bedtime as needed for sleep.  45 tablet  0   No current facility-administered medications on file prior to visit.    Allergies  Allergen Reactions  . Cefuroxime Axetil Anaphylaxis  . Baclofen     Other reaction(s): Other (See Comments) Other Reaction: OTHER REACTION  . Metaxalone     unknown  . Simvastatin Other (See Comments)    Severe pain everywhere, reaction to all statins  . Tetracycline Nausea And Vomiting  . Erythromycin Nausea And Vomiting  . Sulfonamide Derivatives Hives    unknown    Family History  Problem Relation Age of Onset  . Prostate cancer Paternal Grandfather   . Lung cancer Paternal Grandfather   . Arthritis Mother   . Heart disease Mother      atrial fib  . Cancer Maternal Uncle     prostate  . Hypertension Maternal Grandmother   . Cancer Maternal Grandfather     prostate. lung  . Hypertension Paternal Grandmother     History   Social History  . Marital Status: Married    Spouse Name: N/A    Number of Children: N/A  . Years of Education: N/A   Occupational History  . school nurse    Social History Main Topics  . Smoking status: Former Smoker -- 0.25 packs/day for 20 years    Types: Cigarettes    Quit date: 01/13/1988  . Smokeless tobacco: Never Used  . Alcohol Use: Yes     Comment: occasional  . Drug Use: No  . Sexual Activity: Yes    Partners: Male   Other Topics Concern  . None   Social History Narrative  . None   Review of Systems - See HPI.  All other ROS are negative.  BP 128/78  Pulse 86  Temp(Src) 99 F (37.2 C)  Wt 147 lb (66.679 kg)  SpO2 94%  Physical Exam  Vitals reviewed. Constitutional: She is oriented to person, place, and time and well-developed, well-nourished, and in no distress.  HENT:  Head: Normocephalic and atraumatic.  Right Ear: External ear normal.  Left Ear: External ear normal.  Nose: Nose normal.  Mouth/Throat: Oropharynx is clear and moist. No oropharyngeal exudate.  R TM obscured by cerumen impaction.  Impaction removed via irrigation with improvement in symptoms.  Repeat exam reveals R TM that is erythematous and bulging with scant fluid noted behind ear.  Eyes: Conjunctivae are normal.  Neck: Neck supple.  Cardiovascular: Normal rate, regular rhythm, normal heart sounds and intact distal pulses.   Pulmonary/Chest: Effort normal and breath sounds normal. No respiratory distress. She has no wheezes. She has no rales. She exhibits no tenderness.  Lymphadenopathy:       Head (right side): Posterior auricular adenopathy present. No submental, no submandibular, no tonsillar, no preauricular and no occipital adenopathy present.       Head (left side): No submental, no  submandibular, no tonsillar, no preauricular, no posterior auricular and no occipital adenopathy present.    She has no cervical adenopathy.       Right cervical: No superficial cervical,  no deep cervical and no posterior cervical adenopathy present.      Left cervical: No superficial cervical, no deep cervical and no posterior cervical adenopathy present.  Neurological: She is alert and oriented to person, place, and time.  Skin: Skin is warm and dry. No rash noted.  Psychiatric: Affect normal.    Recent Results (from the past 2160 hour(s))  BASIC METABOLIC PANEL     Status: Abnormal   Collection Time    10/03/13  5:55 PM      Result Value Ref Range   Sodium 136  135 - 145 mEq/L   Potassium 4.3  3.5 - 5.1 mEq/L   Chloride 103  96 - 112 mEq/L   CO2 28  19 - 32 mEq/L   Glucose, Bld 97  70 - 99 mg/dL   BUN 13  6 - 23 mg/dL   Creatinine, Ser 1.1  0.4 - 1.2 mg/dL   Calcium 9.2  8.4 - 10.5 mg/dL   GFR 53.76 (*) >60.00 mL/min  CBC WITH DIFFERENTIAL     Status: None   Collection Time    10/03/13  5:55 PM      Result Value Ref Range   WBC 10.5  4.0 - 10.5 K/uL   RBC 4.77  3.87 - 5.11 Mil/uL   Hemoglobin 13.7  12.0 - 15.0 g/dL   HCT 41.3  36.0 - 46.0 %   MCV 86.5  78.0 - 100.0 fl   MCHC 33.2  30.0 - 36.0 g/dL   RDW 13.8  11.5 - 15.5 %   Platelets 356.0  150.0 - 400.0 K/uL   Neutrophils Relative % 64.5  43.0 - 77.0 %   Lymphocytes Relative 26.4  12.0 - 46.0 %   Monocytes Relative 5.8  3.0 - 12.0 %   Eosinophils Relative 2.9  0.0 - 5.0 %   Basophils Relative 0.4  0.0 - 3.0 %   Neutro Abs 6.8  1.4 - 7.7 K/uL   Lymphs Abs 2.8  0.7 - 4.0 K/uL   Monocytes Absolute 0.6  0.1 - 1.0 K/uL   Eosinophils Absolute 0.3  0.0 - 0.7 K/uL   Basophils Absolute 0.0  0.0 - 0.1 K/uL  HEPATIC FUNCTION PANEL     Status: None   Collection Time    10/03/13  5:55 PM      Result Value Ref Range   Total Bilirubin 0.6  0.2 - 1.2 mg/dL   Bilirubin, Direct 0.0  0.0 - 0.3 mg/dL   Alkaline Phosphatase 75  39  - 117 U/L   AST 30  0 - 37 U/L   ALT 35  0 - 35 U/L   Total Protein 7.8  6.0 - 8.3 g/dL   Albumin 4.6  3.5 - 5.2 g/dL  TSH     Status: None   Collection Time    10/03/13  5:55 PM      Result Value Ref Range   TSH 1.24  0.35 - 4.50 uIU/mL  VITAMIN B12     Status: Abnormal   Collection Time    10/03/13  5:55 PM      Result Value Ref Range   Vitamin B-12 1150 (*) 211 - 911 pg/mL  POCT URINALYSIS DIPSTICK     Status: None   Collection Time    10/03/13  6:05 PM      Result Value Ref Range   Color, UA light-yellow     Clarity, UA clear     Glucose, UA  neg     Bilirubin, UA neg     Ketones, UA neg     Spec Grav, UA 1.010     Blood, UA neg     pH, UA 7.0     Protein, UA neg     Urobilinogen, UA 0.2     Nitrite, UA neg     Leukocytes, UA Negative      Assessment/Plan: Otitis media Patient with anaphylactic reaction to Cephalosporins.  Will avoid use of penicillins due to risk of cross-reactivity. Rx Azithromycin. Flonase recommended.  Cerumen impaction Hearing improved s/p irrigation and removal of impaction.  Home measures discussed.

## 2013-10-25 NOTE — Patient Instructions (Addendum)
Take antibiotic as directed with food.  Take a daily probiotic.  Call or return to clinic if symptoms recur.   Cerumen Impaction A cerumen impaction is when the wax in your ear forms a plug. This plug usually causes reduced hearing. Sometimes it also causes an earache or dizziness. Removing a cerumen impaction can be difficult and painful. The wax sticks to the ear canal. The canal is sensitive and bleeds easily. If you try to remove a heavy wax buildup with a cotton tipped swab, you may push it in further. Irrigation with water, suction, and small ear curettes may be used to clear out the wax. If the impaction is fixed to the skin in the ear canal, ear drops may be needed for a few days to loosen the wax. People who build up a lot of wax frequently can use ear wax removal products available in your local drugstore. SEEK MEDICAL CARE IF:  You develop an earache, increased hearing loss, or marked dizziness. Document Released: 02/06/2004 Document Revised: 03/23/2011 Document Reviewed: 03/28/2009 [facility] Patient Information 2015 [place], [place]. This information is not intended to replace advice given to you by your health care provider. Make sure you discuss any questions you have with your health care provider.

## 2013-10-25 NOTE — Progress Notes (Signed)
Pre visit review using our clinic review tool, if applicable. No additional management support is needed unless otherwise documented below in the visit note. 

## 2013-10-26 DIAGNOSIS — H6120 Impacted cerumen, unspecified ear: Secondary | ICD-10-CM | POA: Insufficient documentation

## 2013-10-26 DIAGNOSIS — H6690 Otitis media, unspecified, unspecified ear: Secondary | ICD-10-CM | POA: Insufficient documentation

## 2013-10-26 NOTE — Assessment & Plan Note (Signed)
Hearing improved s/p irrigation and removal of impaction.  Home measures discussed.

## 2013-10-26 NOTE — Assessment & Plan Note (Signed)
Patient with anaphylactic reaction to Cephalosporins.  Will avoid use of penicillins due to risk of cross-reactivity. Rx Azithromycin. Flonase recommended.

## 2013-10-29 ENCOUNTER — Telehealth: Payer: Self-pay | Admitting: Family Medicine

## 2013-10-29 DIAGNOSIS — G4700 Insomnia, unspecified: Secondary | ICD-10-CM

## 2013-10-29 DIAGNOSIS — G894 Chronic pain syndrome: Secondary | ICD-10-CM

## 2013-10-30 ENCOUNTER — Telehealth: Payer: Self-pay

## 2013-10-30 DIAGNOSIS — G894 Chronic pain syndrome: Secondary | ICD-10-CM

## 2013-10-30 DIAGNOSIS — G4700 Insomnia, unspecified: Secondary | ICD-10-CM

## 2013-10-30 MED ORDER — ZOLPIDEM TARTRATE 5 MG PO TABS: 5.0000 mg | ORAL_TABLET | Freq: Every evening | ORAL | Status: DC | PRN

## 2013-10-30 MED ORDER — TRAMADOL HCL 50 MG PO TABS: 50.0000 mg | ORAL_TABLET | Freq: Four times a day (QID) | ORAL | Status: DC | PRN

## 2013-10-30 NOTE — Telephone Encounter (Signed)
Rx faxed.    KP 

## 2013-10-30 NOTE — Telephone Encounter (Signed)
Last seen 10/25/13  Tramadol filled 09/22/13 #120 Ambien filled 09/22/13 #45  UDS 02/02/13 low risk.   Please advise      KP

## 2013-10-30 NOTE — Telephone Encounter (Signed)
tramadol is prescribed routinely by PCP, will go ahead and prescribed #120. She gets Ambien 5 mg one tablet each bedtime, I don't see the need to prescribe 45 tablets, ok #30 if so desire. Please clarify

## 2013-10-31 NOTE — Telephone Encounter (Signed)
Ambien rx faxed to [facility].

## 2013-10-31 NOTE — Telephone Encounter (Signed)
Ambien rx faxed to

## 2013-11-02 ENCOUNTER — Encounter: Payer: Self-pay | Admitting: Family Medicine

## 2013-11-06 NOTE — Telephone Encounter (Signed)
Did Dr Larose Kells approve meds?   Which ones did she need?

## 2013-11-20 ENCOUNTER — Other Ambulatory Visit: Payer: Self-pay | Admitting: Family Medicine

## 2013-11-20 ENCOUNTER — Other Ambulatory Visit: Payer: Self-pay | Admitting: Internal Medicine

## 2013-11-20 ENCOUNTER — Encounter: Payer: Self-pay | Admitting: Family Medicine

## 2013-11-20 DIAGNOSIS — R079 Chest pain, unspecified: Secondary | ICD-10-CM

## 2013-11-20 DIAGNOSIS — G894 Chronic pain syndrome: Secondary | ICD-10-CM

## 2013-11-20 DIAGNOSIS — G4700 Insomnia, unspecified: Secondary | ICD-10-CM

## 2013-11-21 ENCOUNTER — Encounter: Payer: Self-pay | Admitting: Family Medicine

## 2013-11-21 MED ORDER — ZOLPIDEM TARTRATE 5 MG PO TABS: 5.0000 mg | ORAL_TABLET | Freq: Every evening | ORAL | Status: DC | PRN

## 2013-11-21 MED ORDER — GABAPENTIN 300 MG PO CAPS: 300.0000 mg | ORAL_CAPSULE | Freq: Three times a day (TID) | ORAL | Status: DC

## 2013-11-21 MED ORDER — TRAMADOL HCL 50 MG PO TABS: 50.0000 mg | ORAL_TABLET | Freq: Four times a day (QID) | ORAL | Status: DC | PRN

## 2013-11-21 NOTE — Telephone Encounter (Signed)
Refill x1 each 

## 2013-11-21 NOTE — Addendum Note (Signed)
Addended by: Ewing Schlein on: 11/21/2013 01:57 PM   Modules accepted: Orders

## 2013-11-21 NOTE — Telephone Encounter (Signed)
Last seen 10/03/13  Tramadol filled 10/30/13 #120 Ambien filled 10/30/13 #30  Please advise     KP

## 2013-12-25 ENCOUNTER — Other Ambulatory Visit: Payer: Self-pay | Admitting: Family Medicine

## 2013-12-25 ENCOUNTER — Encounter: Payer: Self-pay | Admitting: Family Medicine

## 2013-12-25 DIAGNOSIS — G894 Chronic pain syndrome: Secondary | ICD-10-CM

## 2013-12-25 DIAGNOSIS — G4700 Insomnia, unspecified: Secondary | ICD-10-CM

## 2013-12-25 MED ORDER — ZOLPIDEM TARTRATE 5 MG PO TABS: 5.0000 mg | ORAL_TABLET | Freq: Every evening | ORAL | Status: DC | PRN

## 2013-12-25 MED ORDER — TRAMADOL HCL 50 MG PO TABS: 50.0000 mg | ORAL_TABLET | Freq: Four times a day (QID) | ORAL | Status: DC | PRN

## 2013-12-25 NOTE — Telephone Encounter (Signed)
Last seen 10/03/13 and filled 11/21/13. Please advise     KP

## 2013-12-26 ENCOUNTER — Encounter: Payer: Self-pay | Admitting: Family Medicine

## 2013-12-26 DIAGNOSIS — F32A Depression, unspecified: Secondary | ICD-10-CM

## 2013-12-26 DIAGNOSIS — F329 Major depressive disorder, single episode, unspecified: Secondary | ICD-10-CM

## 2013-12-26 MED ORDER — FLUOXETINE HCL 20 MG PO TABS: 20.0000 mg | ORAL_TABLET | Freq: Every day | ORAL | Status: DC

## 2013-12-29 ENCOUNTER — Telehealth: Payer: Self-pay | Admitting: Family Medicine

## 2013-12-29 DIAGNOSIS — Z0100 Encounter for examination of eyes and vision without abnormal findings: Secondary | ICD-10-CM

## 2013-12-29 NOTE — Telephone Encounter (Signed)
Caller name: codee Relation to pt: self Call back number: [phone] Pharmacy:  Reason for call:   Patient has appointment with Dr. Patsi Sears at [facility] in [place] for her glasses and contacts.(yearly) and needs a [employer] referral. Appointment is on 01/30/14

## 2014-01-22 ENCOUNTER — Other Ambulatory Visit: Payer: Self-pay | Admitting: Family Medicine

## 2014-01-22 ENCOUNTER — Encounter: Payer: Self-pay | Admitting: Family Medicine

## 2014-01-22 DIAGNOSIS — R079 Chest pain, unspecified: Secondary | ICD-10-CM

## 2014-01-22 DIAGNOSIS — G4700 Insomnia, unspecified: Secondary | ICD-10-CM

## 2014-01-23 MED ORDER — CLONAZEPAM 1 MG PO TABS: 1.0000 mg | ORAL_TABLET | Freq: Every day | ORAL | Status: DC

## 2014-01-23 MED ORDER — ZOLPIDEM TARTRATE 5 MG PO TABS: 5.0000 mg | ORAL_TABLET | Freq: Every evening | ORAL | Status: DC | PRN

## 2014-01-23 MED ORDER — CLONAZEPAM 1 MG PO TABS
1.0000 mg | ORAL_TABLET | Freq: Every day | ORAL | Status: DC
Start: 2014-01-23 — End: 2014-01-23

## 2014-01-23 NOTE — Telephone Encounter (Signed)
Last seen 10/03/13 and filled  Ambien 12/25/13 #30 Klonopin 10/02/13 #90  UDS 02/02/13 low risk.  Please advise      KP

## 2014-01-23 NOTE — Telephone Encounter (Signed)
Patient requesting refills of Klonopin, Ambien.   Last office visit 10/03/13.  Last UDS 02/02/13- low risk.   Klonopin last refilled 9/21 for 90 and 0.    Ambien last refilled 12/14 for 30 and 0.   Please advise.        EAL

## 2014-01-23 NOTE — Telephone Encounter (Signed)
Refill klonopin x1,  2 refills ambien 5 mg only dose approved for older women neurontin 300 mg 1 po bid and 2 po qhs #120  5 refills

## 2014-01-23 NOTE — Telephone Encounter (Signed)
Refill clonopin x 1 ambien 5 mg is the dose for women --- 10 mg no longer advised for women neurontin 300 mg 1 bid and 2 po qhs --120,   5 refills

## 2014-01-24 ENCOUNTER — Encounter: Payer: Self-pay | Admitting: Family Medicine

## 2014-01-24 NOTE — Telephone Encounter (Signed)
I placed a referral for the opthalmologist in December, was this ever completed   ?

## 2014-01-29 ENCOUNTER — Encounter: Payer: Self-pay | Admitting: Family Medicine

## 2014-01-29 DIAGNOSIS — L729 Follicular cyst of the skin and subcutaneous tissue, unspecified: Secondary | ICD-10-CM

## 2014-01-29 NOTE — Telephone Encounter (Signed)
Refer to derm-- for scalp cyst

## 2014-02-12 ENCOUNTER — Other Ambulatory Visit: Payer: Self-pay | Admitting: Family Medicine

## 2014-02-12 DIAGNOSIS — G894 Chronic pain syndrome: Secondary | ICD-10-CM

## 2014-02-12 MED ORDER — TRAMADOL HCL 50 MG PO TABS: 50.0000 mg | ORAL_TABLET | Freq: Four times a day (QID) | ORAL | Status: DC | PRN

## 2014-02-12 NOTE — Telephone Encounter (Signed)
Last seen 10/03/13 and filled 12/25/13 #120   UDS 02/02/13 low risk  Please advise      KP

## 2014-02-14 DIAGNOSIS — L814 Other melanin hyperpigmentation: Secondary | ICD-10-CM | POA: Diagnosis not present

## 2014-02-14 DIAGNOSIS — R208 Other disturbances of skin sensation: Secondary | ICD-10-CM | POA: Diagnosis not present

## 2014-02-20 ENCOUNTER — Encounter: Payer: Self-pay | Admitting: Family Medicine

## 2014-02-28 ENCOUNTER — Other Ambulatory Visit: Payer: Self-pay | Admitting: Family Medicine

## 2014-02-28 DIAGNOSIS — F988 Other specified behavioral and emotional disorders with onset usually occurring in childhood and adolescence: Secondary | ICD-10-CM

## 2014-02-28 DIAGNOSIS — G894 Chronic pain syndrome: Secondary | ICD-10-CM

## 2014-02-28 DIAGNOSIS — G4700 Insomnia, unspecified: Secondary | ICD-10-CM

## 2014-03-01 MED ORDER — METHYLPHENIDATE HCL 10 MG PO TABS: 10.0000 mg | ORAL_TABLET | Freq: Three times a day (TID) | ORAL | Status: DC

## 2014-03-01 MED ORDER — TRAMADOL HCL 50 MG PO TABS: 50.0000 mg | ORAL_TABLET | Freq: Four times a day (QID) | ORAL | Status: DC | PRN

## 2014-03-01 MED ORDER — ZOLPIDEM TARTRATE 5 MG PO TABS: 5.0000 mg | ORAL_TABLET | Freq: Every evening | ORAL | Status: DC | PRN

## 2014-03-01 NOTE — Addendum Note (Signed)
Addended by: Ewing Schlein on: 03/01/2014 09:53 AM   Modules accepted: Orders

## 2014-03-01 NOTE — Telephone Encounter (Signed)
Last seen 10/03/13  Ritalin filled  10/09/13 #270 Ambien 01/23/14 #30 Tramadol 02/12/14 #120   Please advise     KP

## 2014-03-05 DIAGNOSIS — H5231 Anisometropia: Secondary | ICD-10-CM | POA: Diagnosis not present

## 2014-03-13 NOTE — Addendum Note (Signed)
Addended by: Ewing Schlein on: 03/13/2014 06:54 PM   Modules accepted: Orders, Medications

## 2014-03-13 NOTE — Telephone Encounter (Signed)
Dr.Lowne is it ok to mail the Ritalin?

## 2014-03-14 NOTE — Telephone Encounter (Signed)
We can not mail it because of the the cat meds they are.  They have to be picked up

## 2014-03-14 NOTE — Telephone Encounter (Signed)
It has to be picked up--- we can not mail it because of the type of med it is

## 2014-03-19 ENCOUNTER — Other Ambulatory Visit: Payer: Self-pay | Admitting: Family Medicine

## 2014-03-20 NOTE — Telephone Encounter (Signed)
Dr.Lowne when you sent this last it was changed from 70 mg weekly to 10 mg weekly, is this accurate or should the patient be taking the 70 mg weekly?     KP

## 2014-04-02 DIAGNOSIS — M9905 Segmental and somatic dysfunction of pelvic region: Secondary | ICD-10-CM | POA: Diagnosis not present

## 2014-04-02 DIAGNOSIS — M4300 Spondylolysis, site unspecified: Secondary | ICD-10-CM | POA: Diagnosis not present

## 2014-04-02 DIAGNOSIS — M9904 Segmental and somatic dysfunction of sacral region: Secondary | ICD-10-CM | POA: Diagnosis not present

## 2014-04-02 DIAGNOSIS — M9903 Segmental and somatic dysfunction of lumbar region: Secondary | ICD-10-CM | POA: Diagnosis not present

## 2014-04-02 DIAGNOSIS — M5430 Sciatica, unspecified side: Secondary | ICD-10-CM | POA: Diagnosis not present

## 2014-04-02 DIAGNOSIS — M5136 Other intervertebral disc degeneration, lumbar region: Secondary | ICD-10-CM | POA: Diagnosis not present

## 2014-04-04 ENCOUNTER — Ambulatory Visit (INDEPENDENT_AMBULATORY_CARE_PROVIDER_SITE_OTHER): Payer: Commercial Managed Care - HMO | Admitting: Family

## 2014-04-04 ENCOUNTER — Encounter: Payer: Self-pay | Admitting: Family

## 2014-04-04 VITALS — BP 120/80 | HR 72 | Temp 98.6°F | Resp 16 | Ht <= 58 in | Wt 146.6 lb

## 2014-04-04 DIAGNOSIS — N3000 Acute cystitis without hematuria: Secondary | ICD-10-CM | POA: Diagnosis not present

## 2014-04-04 DIAGNOSIS — N390 Urinary tract infection, site not specified: Secondary | ICD-10-CM | POA: Insufficient documentation

## 2014-04-04 LAB — POCT URINALYSIS DIPSTICK
Bilirubin, UA: NEGATIVE
Blood, UA: NEGATIVE
Glucose, UA: NEGATIVE
Ketones, UA: NEGATIVE
Leukocytes, UA: NEGATIVE
Nitrite, UA: NEGATIVE
PH UA: 6.5
Protein, UA: NEGATIVE
Spec Grav, UA: 1.01
UROBILINOGEN UA: NEGATIVE

## 2014-04-04 MED ORDER — CIPROFLOXACIN HCL 500 MG PO TABS: 500.0000 mg | ORAL_TABLET | Freq: Two times a day (BID) | ORAL | Status: DC

## 2014-04-04 NOTE — Progress Notes (Signed)
Pre visit review using our clinic review tool, if applicable. No additional management support is needed unless otherwise documented below in the visit note. 

## 2014-04-04 NOTE — Progress Notes (Signed)
Subjective:    Patient ID: [name], female    DOB: 05/19/1950, 64 y.o.   MRN: [id number]  HPI  [name] is a 64 yr old female with hx of low back pain who presents today with chief complaint of urinary frequency and worsening low back pain.  Reports symptoms started about 1.5 weeks ago. Reports + dysuria, frequency, incontinence (hx bladder sling 2001).  Reports hx of chronic UTIs, previously on macrodantin, though not x 7 years.  She reports N/V/D 2 weeks ago which was resolved.  No known fever. Reports + associated fatigue.  Has not had a BM in 5 days despite colon cleanse and miralax.     Review of Systems See HPI  Past Medical History  Diagnosis Date  . ADD (attention deficit disorder)   . Depression   . GERD (gastroesophageal reflux disease)   . Hyperlipemia     "borderline"  . IBS (irritable bowel syndrome)     chronic constipation  . Fibromyalgia   . Osteoporosis   . Bronchitis     hx of  . Hepatitis     B  . Cataract of left eye     since birth  . Osteoarthritis     History   Social History  . Marital Status: Married    Spouse Name: N/A  . Number of Children: N/A  . Years of Education: N/A   Occupational History  . school nurse    Social History Main Topics  . Smoking status: Former Smoker -- 0.25 packs/day for 20 years    Types: Cigarettes    Quit date: 01/13/1988  . Smokeless tobacco: Never Used  . Alcohol Use: Yes     Comment: occasional  . Drug Use: No  . Sexual Activity:    Partners: Male   Other Topics Concern  . Not on file   Social History Narrative    Past Surgical History  Procedure Laterality Date  . Cholecystectomy    . Cesarean section      x4  . Tonsillectomy    . Total knee arthroplasty Right 2009    [facility]IN  . Toe amputation  2007  . Ventral hernia repair  2007    "with human screen"  . Carpal tunnel release Bilateral   . Bladder suspension  2001  . Abdominal hysterectomy  2001  . Upper gi endoscopy    .  Nose surgery Left 2007    "benign tumor coming from left nostril"  . Breast surgery      several tumors removed  . Total knee arthroplasty Left 12/26/2012    Procedure: LEFT TOTAL KNEE ARTHROPLASTY;  Surgeon: Mauri Pole, MD;  Location: [facility];  Service: Orthopedics;  Laterality: Left;    Family History  Problem Relation Age of Onset  . Prostate cancer Paternal Grandfather   . Lung cancer Paternal Grandfather   . Arthritis Mother   . Heart disease Mother     atrial fib  . Cancer Maternal Uncle     prostate  . Hypertension Maternal Grandmother   . Cancer Maternal Grandfather     prostate. lung  . Hypertension Paternal Grandmother     Allergies  Allergen Reactions  . Cefuroxime Axetil Anaphylaxis  . Baclofen     Other reaction(s): Other (See Comments) Other Reaction: OTHER REACTION  . Metaxalone     unknown  . Simvastatin Other (See Comments)    Severe pain everywhere, reaction to all statins  . Tetracycline  Nausea And Vomiting  . Erythromycin Nausea And Vomiting  . Sulfonamide Derivatives Hives    unknown    Current Outpatient Prescriptions on File Prior to Visit  Medication Sig Dispense Refill  . alendronate (FOSAMAX) 70 MG tablet TAKE 1 TABLET EVERY WEEK. TAKE WITH FULL GLASS OF WATER ON AN EMPTY STOMACH 12 tablet 3  . ARIPiprazole (ABILIFY) 2 MG tablet Take 1 mg by mouth daily.    Marland Kitchen CALCIUM-MAGNESIUM-ZINC PO Take 2 capsules by mouth at bedtime.    . clonazePAM (KLONOPIN) 1 MG tablet Take 1 tablet (1 mg total) by mouth at bedtime. 90 tablet 2  . conjugated estrogens (PREMARIN) vaginal cream 1 g pv qd for 2 weeks then decrease to every other day.  Wean down to 0.5 g  2-3 days a week if possible 42.5 g 3  . cyclobenzaprine (FLEXERIL) 10 MG tablet Take 1 tablet (10 mg total) by mouth 2 (two) times daily as needed for muscle spasms. 180 tablet 0  . docusate sodium 100 MG CAPS Take 100 mg by mouth 2 (two) times daily. 10 capsule 0  . EPINEPHrine (EPIPEN) 0.3 mg/0.3 mL  DEVI Inject 0.3 mLs (0.3 mg total) into the muscle once. 1 Device 2  . FLUoxetine (PROZAC) 20 MG tablet Take 1 tablet (20 mg total) by mouth daily. 90 tablet 1  . fluticasone (FLONASE) 50 MCG/ACT nasal spray Place 2 sprays into both nostrils daily.    Marland Kitchen gabapentin (NEURONTIN) 300 MG capsule Take 1 capsule (300 mg total) by mouth 3 (three) times daily. (Patient taking differently: Take 300 mg by mouth 3 (three) times daily. 04/04/14 PT IS TAKING 2 TWICE A DAY.) 270 capsule 1  . hydrochlorothiazide (HYDRODIURIL) 25 MG tablet Take 1 tablet (25 mg total) by mouth daily. 90 tablet 1  . HYDROcodone-acetaminophen (NORCO) 7.5-325 MG per tablet Take 1-2 tablets by mouth every 4 (four) hours as needed for moderate pain. 100 tablet 0  . hyoscyamine (LEVSIN/SL) 0.125 MG SL tablet Place 1 tablet (0.125 mg total) under the tongue every 4 (four) hours as needed. 90 tablet 1  . meloxicam (MOBIC) 15 MG tablet TAKE 1 TABLET EVERY DAY 90 tablet 0  . methylphenidate (RITALIN) 10 MG tablet Take 1 tablet (10 mg total) by mouth 3 (three) times daily with meals. 270 tablet 0  . Multiple Vitamin (MULTIVITAMIN WITH MINERALS) TABS tablet Take 1 tablet by mouth daily.    . niacin (GNP NIACIN) 250 MG tablet Take 500 mg by mouth daily with breakfast.     . Omega-3 Fatty Acids (FISH OIL) 1200 MG CAPS Take 2,400 capsules by mouth daily.    Marland Kitchen OVER THE COUNTER MEDICATION Take 2 tablets by mouth daily. TUMERIC    . polyethylene glycol (MIRALAX / GLYCOLAX) packet Take 17 g by mouth 2 (two) times daily. 14 each 0  . traMADol (ULTRAM) 50 MG tablet Take 1 tablet (50 mg total) by mouth every 6 (six) hours as needed. 120 tablet 0  . valACYclovir (VALTREX) 500 MG tablet Take 1 tablet (500 mg total) by mouth daily. 90 tablet 3   No current facility-administered medications on file prior to visit.    BP 120/80 mmHg  Pulse 72  Temp(Src) 98.6 F (37 C) (Oral)  Resp 16  Ht 4\' 8"  (1.422 m)  Wt 146 lb 9.6 oz (66.497 kg)  BMI 32.89 kg/m2   SpO2 96%       Objective:   Physical Exam  Constitutional: She is oriented to person,  place, and time. She appears well-developed and well-nourished.  HENT:  Head: Normocephalic and atraumatic.  Cardiovascular: Normal rate, regular rhythm and normal heart sounds.   No murmur heard. Pulmonary/Chest: Effort normal and breath sounds normal. No respiratory distress. She has no wheezes.  Abdominal: Soft. Bowel sounds are normal. She exhibits no distension. There is no tenderness.  Very mild bilateral CVAT  Musculoskeletal: She exhibits no edema.  Neurological: She is alert and oriented to person, place, and time.  Psychiatric: She has a normal mood and affect. Her behavior is normal. Judgment and thought content normal.          Assessment & Plan:  We discussed possibility of fleets enema prn constipation

## 2014-04-04 NOTE — Patient Instructions (Signed)
Please start cipro for possible urinary tract infection. Call if symptoms worsen, if fever >101, or if symptoms not improved in 3 days.

## 2014-04-04 NOTE — Assessment & Plan Note (Signed)
UA clean, but due to symptoms will send Urine for culture and begin cipro.  Pt is instructed to call if symptoms worsen, if fever >101, or if symptoms not improved in 3 days.

## 2014-04-06 DIAGNOSIS — M4300 Spondylolysis, site unspecified: Secondary | ICD-10-CM | POA: Diagnosis not present

## 2014-04-06 DIAGNOSIS — M5136 Other intervertebral disc degeneration, lumbar region: Secondary | ICD-10-CM | POA: Diagnosis not present

## 2014-04-06 DIAGNOSIS — M9904 Segmental and somatic dysfunction of sacral region: Secondary | ICD-10-CM | POA: Diagnosis not present

## 2014-04-06 DIAGNOSIS — M9905 Segmental and somatic dysfunction of pelvic region: Secondary | ICD-10-CM | POA: Diagnosis not present

## 2014-04-06 DIAGNOSIS — M9903 Segmental and somatic dysfunction of lumbar region: Secondary | ICD-10-CM | POA: Diagnosis not present

## 2014-04-06 DIAGNOSIS — M5430 Sciatica, unspecified side: Secondary | ICD-10-CM | POA: Diagnosis not present

## 2014-04-07 ENCOUNTER — Encounter: Payer: Self-pay | Admitting: Family

## 2014-04-07 ENCOUNTER — Encounter: Payer: Self-pay | Admitting: Family Medicine

## 2014-04-07 LAB — URINE CULTURE: Colony Count: >=100000

## 2014-04-08 ENCOUNTER — Encounter: Payer: Self-pay | Admitting: Family Medicine

## 2014-04-09 ENCOUNTER — Telehealth: Payer: Self-pay | Admitting: *Deleted

## 2014-04-09 ENCOUNTER — Encounter: Payer: Self-pay | Admitting: Family Medicine

## 2014-04-09 ENCOUNTER — Encounter: Payer: Self-pay | Admitting: *Deleted

## 2014-04-09 MED ORDER — NITROFURANTOIN MONOHYD MACRO 100 MG PO CAPS: 100.0000 mg | ORAL_CAPSULE | Freq: Two times a day (BID) | ORAL | Status: DC

## 2014-04-09 NOTE — Telephone Encounter (Signed)
Pre-Visit Call completed with patient and chart updated.   Pre-Visit Info documented in Specialty Comments under [employer].    

## 2014-04-09 NOTE — Telephone Encounter (Signed)
additoinal days. After talking to pt she thinks allergic to cipro. So rx macrobid.

## 2014-04-10 ENCOUNTER — Ambulatory Visit (INDEPENDENT_AMBULATORY_CARE_PROVIDER_SITE_OTHER): Payer: Commercial Managed Care - HMO | Admitting: Family Medicine

## 2014-04-10 ENCOUNTER — Encounter: Payer: Self-pay | Admitting: Family Medicine

## 2014-04-10 VITALS — BP 140/88 | HR 78 | Temp 97.4°F | Ht <= 58 in | Wt 144.0 lb

## 2014-04-10 DIAGNOSIS — A6000 Herpesviral infection of urogenital system, unspecified: Secondary | ICD-10-CM

## 2014-04-10 DIAGNOSIS — Z0000 Encounter for general adult medical examination without abnormal findings: Secondary | ICD-10-CM | POA: Diagnosis not present

## 2014-04-10 DIAGNOSIS — F908 Attention-deficit hyperactivity disorder, other type: Secondary | ICD-10-CM

## 2014-04-10 DIAGNOSIS — R739 Hyperglycemia, unspecified: Secondary | ICD-10-CM | POA: Diagnosis not present

## 2014-04-10 DIAGNOSIS — M6248 Contracture of muscle, other site: Secondary | ICD-10-CM | POA: Diagnosis not present

## 2014-04-10 DIAGNOSIS — E785 Hyperlipidemia, unspecified: Secondary | ICD-10-CM

## 2014-04-10 DIAGNOSIS — F329 Major depressive disorder, single episode, unspecified: Secondary | ICD-10-CM

## 2014-04-10 DIAGNOSIS — F909 Attention-deficit hyperactivity disorder, unspecified type: Secondary | ICD-10-CM

## 2014-04-10 DIAGNOSIS — M8580 Other specified disorders of bone density and structure, unspecified site: Secondary | ICD-10-CM

## 2014-04-10 DIAGNOSIS — F988 Other specified behavioral and emotional disorders with onset usually occurring in childhood and adolescence: Secondary | ICD-10-CM

## 2014-04-10 DIAGNOSIS — M5136 Other intervertebral disc degeneration, lumbar region: Secondary | ICD-10-CM | POA: Diagnosis not present

## 2014-04-10 DIAGNOSIS — G4700 Insomnia, unspecified: Secondary | ICD-10-CM | POA: Diagnosis not present

## 2014-04-10 DIAGNOSIS — N898 Other specified noninflammatory disorders of vagina: Secondary | ICD-10-CM | POA: Diagnosis not present

## 2014-04-10 DIAGNOSIS — K589 Irritable bowel syndrome without diarrhea: Secondary | ICD-10-CM

## 2014-04-10 DIAGNOSIS — R079 Chest pain, unspecified: Secondary | ICD-10-CM

## 2014-04-10 DIAGNOSIS — N390 Urinary tract infection, site not specified: Secondary | ICD-10-CM

## 2014-04-10 DIAGNOSIS — J302 Other seasonal allergic rhinitis: Secondary | ICD-10-CM

## 2014-04-10 DIAGNOSIS — K219 Gastro-esophageal reflux disease without esophagitis: Secondary | ICD-10-CM

## 2014-04-10 DIAGNOSIS — I10 Essential (primary) hypertension: Secondary | ICD-10-CM

## 2014-04-10 DIAGNOSIS — T782XXA Anaphylactic shock, unspecified, initial encounter: Secondary | ICD-10-CM

## 2014-04-10 DIAGNOSIS — F32A Depression, unspecified: Secondary | ICD-10-CM

## 2014-04-10 DIAGNOSIS — G894 Chronic pain syndrome: Secondary | ICD-10-CM

## 2014-04-10 DIAGNOSIS — M62838 Other muscle spasm: Secondary | ICD-10-CM

## 2014-04-10 LAB — POCT URINALYSIS DIPSTICK
Bilirubin, UA: NEGATIVE
Blood, UA: NEGATIVE
Glucose, UA: NEGATIVE
KETONES UA: NEGATIVE
LEUKOCYTES UA: NEGATIVE
Nitrite, UA: NEGATIVE
Protein, UA: NEGATIVE
SPEC GRAV UA: 1.025
Urobilinogen, UA: NEGATIVE
pH, UA: 6

## 2014-04-10 MED ORDER — VALACYCLOVIR HCL 500 MG PO TABS: 500.0000 mg | ORAL_TABLET | Freq: Every day | ORAL | Status: DC

## 2014-04-10 MED ORDER — EPINEPHRINE 0.3 MG/0.3ML IJ SOAJ: INTRAMUSCULAR | Status: DC

## 2014-04-10 MED ORDER — MELOXICAM 15 MG PO TABS: 15.0000 mg | ORAL_TABLET | Freq: Every day | ORAL | Status: DC

## 2014-04-10 MED ORDER — ARIPIPRAZOLE 2 MG PO TABS: 1.0000 mg | ORAL_TABLET | Freq: Every day | ORAL | Status: DC

## 2014-04-10 MED ORDER — ESTROGENS, CONJUGATED 0.625 MG/GM VA CREA: TOPICAL_CREAM | VAGINAL | Status: DC

## 2014-04-10 MED ORDER — ALENDRONATE SODIUM 70 MG PO TABS: ORAL_TABLET | ORAL | Status: DC

## 2014-04-10 MED ORDER — ZOLPIDEM TARTRATE 5 MG PO TABS: 5.0000 mg | ORAL_TABLET | Freq: Every evening | ORAL | Status: DC | PRN

## 2014-04-10 MED ORDER — LEVOFLOXACIN 250 MG PO TABS: 250.0000 mg | ORAL_TABLET | Freq: Every day | ORAL | Status: DC

## 2014-04-10 MED ORDER — METHYLPHENIDATE HCL 10 MG PO TABS: 10.0000 mg | ORAL_TABLET | Freq: Three times a day (TID) | ORAL | Status: DC

## 2014-04-10 MED ORDER — FLUOXETINE HCL 20 MG PO TABS: 20.0000 mg | ORAL_TABLET | Freq: Every day | ORAL | Status: DC

## 2014-04-10 MED ORDER — FLUTICASONE PROPIONATE 50 MCG/ACT NA SUSP: 2.0000 | Freq: Every day | NASAL | Status: DC

## 2014-04-10 MED ORDER — OMEPRAZOLE 40 MG PO CPDR: 40.0000 mg | DELAYED_RELEASE_CAPSULE | Freq: Every day | ORAL | Status: DC

## 2014-04-10 MED ORDER — CLONAZEPAM 1 MG PO TABS: 1.0000 mg | ORAL_TABLET | Freq: Every day | ORAL | Status: DC

## 2014-04-10 MED ORDER — HYOSCYAMINE SULFATE 0.125 MG SL SUBL: 0.1250 mg | SUBLINGUAL_TABLET | SUBLINGUAL | Status: DC | PRN

## 2014-04-10 MED ORDER — GABAPENTIN 300 MG PO CAPS: 300.0000 mg | ORAL_CAPSULE | Freq: Three times a day (TID) | ORAL | Status: DC

## 2014-04-10 MED ORDER — CYCLOBENZAPRINE HCL 10 MG PO TABS
10.0000 mg | ORAL_TABLET | Freq: Two times a day (BID) | ORAL | Status: DC | PRN
Start: 2014-04-10 — End: 2014-07-18

## 2014-04-10 MED ORDER — HYDROCHLOROTHIAZIDE 25 MG PO TABS: 25.0000 mg | ORAL_TABLET | Freq: Every day | ORAL | Status: DC

## 2014-04-10 MED ORDER — TRAMADOL HCL 50 MG PO TABS: 50.0000 mg | ORAL_TABLET | Freq: Four times a day (QID) | ORAL | Status: DC | PRN

## 2014-04-10 NOTE — Patient Instructions (Addendum)
Preventive Care for Adults A healthy lifestyle and preventive care can promote health and wellness. Preventive health guidelines for women include the following key practices.  A routine yearly physical is a good way to check with your health care provider about your health and preventive screening. It is a chance to share any concerns and updates on your health and to receive a thorough exam.  Visit your dentist for a routine exam and preventive care every 6 months. Brush your teeth twice a day and floss once a day. Good oral hygiene prevents tooth decay and gum disease.  The frequency of eye exams is based on your age, health, family medical history, use of contact lenses, and other factors. Follow your health care provider's recommendations for frequency of eye exams.  Eat a healthy diet. Foods like vegetables, fruits, whole grains, low-fat dairy products, and lean protein foods contain the nutrients you need without too many calories. Decrease your intake of foods high in solid fats, added sugars, and salt. Eat the right amount of calories for you.Get information about a proper diet from your health care provider, if necessary.  Regular physical exercise is one of the most important things you can do for your health. Most adults should get at least 150 minutes of moderate-intensity exercise (any activity that increases your heart rate and causes you to sweat) each week. In addition, most adults need muscle-strengthening exercises on 2 or more days a week.  Maintain a healthy weight. The body mass index (BMI) is a screening tool to identify possible weight problems. It provides an estimate of body fat based on height and weight. Your health care provider can find your BMI and can help you achieve or maintain a healthy weight.For adults 20 years and older:  A BMI below 18.5 is considered underweight.  A BMI of 18.5 to 24.9 is normal.  A BMI of 25 to 29.9 is considered overweight.  A BMI of  30 and above is considered obese.  Maintain normal blood lipids and cholesterol levels by exercising and minimizing your intake of saturated fat. Eat a balanced diet with plenty of fruit and vegetables. Blood tests for lipids and cholesterol should begin at age 76 and be repeated every 5 years. If your lipid or cholesterol levels are high, you are over 50, or you are at high risk for heart disease, you may need your cholesterol levels checked more frequently.Ongoing high lipid and cholesterol levels should be treated with medicines if diet and exercise are not working.  If you smoke, find out from your health care provider how to quit. If you do not use tobacco, do not start.  Lung cancer screening is recommended for adults aged 22-80 years who are at high risk for developing lung cancer because of a history of smoking. A yearly low-dose CT scan of the lungs is recommended for people who have at least a 30-pack-year history of smoking and are a current smoker or have quit within the past 15 years. A pack year of smoking is smoking an average of 1 pack of cigarettes a day for 1 year (for example: 1 pack a day for 30 years or 2 packs a day for 15 years). Yearly screening should continue until the smoker has stopped smoking for at least 15 years. Yearly screening should be stopped for people who develop a health problem that would prevent them from having lung cancer treatment.  If you are pregnant, do not drink alcohol. If you are breastfeeding,  be very cautious about drinking alcohol. If you are not pregnant and choose to drink alcohol, do not have more than 1 drink per day. One drink is considered to be 12 ounces (355 mL) of beer, 5 ounces (148 mL) of wine, or 1.5 ounces (44 mL) of liquor.  Avoid use of street drugs. Do not share needles with anyone. Ask for help if you need support or instructions about stopping the use of drugs.  High blood pressure causes heart disease and increases the risk of  stroke. Your blood pressure should be checked at least every 1 to 2 years. Ongoing high blood pressure should be treated with medicines if weight loss and exercise do not work.  If you are 3-86 years old, ask your health care provider if you should take aspirin to prevent strokes.  Diabetes screening involves taking a blood sample to check your fasting blood sugar level. This should be done once every 3 years, after age 67, if you are within normal weight and without risk factors for diabetes. Testing should be considered at a younger age or be carried out more frequently if you are overweight and have at least 1 risk factor for diabetes.  Breast cancer screening is essential preventive care for women. You should practice "breast self-awareness." This means understanding the normal appearance and feel of your breasts and may include breast self-examination. Any changes detected, no matter how small, should be reported to a health care provider. Women in their 8s and 30s should have a clinical breast exam (CBE) by a health care provider as part of a regular health exam every 1 to 3 years. After age 70, women should have a CBE every year. Starting at age 25, women should consider having a mammogram (breast X-ray test) every year. Women who have a family history of breast cancer should talk to their health care provider about genetic screening. Women at a high risk of breast cancer should talk to their health care providers about having an MRI and a mammogram every year.  Breast cancer gene (BRCA)-related cancer risk assessment is recommended for women who have family members with BRCA-related cancers. BRCA-related cancers include breast, ovarian, tubal, and peritoneal cancers. Having family members with these cancers may be associated with an increased risk for harmful changes (mutations) in the breast cancer genes BRCA1 and BRCA2. Results of the assessment will determine the need for genetic counseling and  BRCA1 and BRCA2 testing.  Routine pelvic exams to screen for cancer are no longer recommended for nonpregnant women who are considered low risk for cancer of the pelvic organs (ovaries, uterus, and vagina) and who do not have symptoms. Ask your health care provider if a screening pelvic exam is right for you.  If you have had past treatment for cervical cancer or a condition that could lead to cancer, you need Pap tests and screening for cancer for at least 20 years after your treatment. If Pap tests have been discontinued, your risk factors (such as having a new sexual partner) need to be reassessed to determine if screening should be resumed. Some women have medical problems that increase the chance of getting cervical cancer. In these cases, your health care provider may recommend more frequent screening and Pap tests.  The HPV test is an additional test that may be used for cervical cancer screening. The HPV test looks for the virus that can cause the cell changes on the cervix. The cells collected during the Pap test can be  tested for HPV. The HPV test could be used to screen women aged 30 years and older, and should be used in women of any age who have unclear Pap test results. After the age of 30, women should have HPV testing at the same frequency as a Pap test.  Colorectal cancer can be detected and often prevented. Most routine colorectal cancer screening begins at the age of 50 years and continues through age 75 years. However, your health care provider may recommend screening at an earlier age if you have risk factors for colon cancer. On a yearly basis, your health care provider may provide home test kits to check for hidden blood in the stool. Use of a small camera at the end of a tube, to directly examine the colon (sigmoidoscopy or colonoscopy), can detect the earliest forms of colorectal cancer. Talk to your health care provider about this at age 50, when routine screening begins. Direct  exam of the colon should be repeated every 5-10 years through age 75 years, unless early forms of pre-cancerous polyps or small growths are found.  People who are at an increased risk for hepatitis B should be screened for this virus. You are considered at high risk for hepatitis B if:  You were born in a country where hepatitis B occurs often. Talk with your health care provider about which countries are considered high risk.  Your parents were born in a high-risk country and you have not received a shot to protect against hepatitis B (hepatitis B vaccine).  You have HIV or AIDS.  You use needles to inject street drugs.  You live with, or have sex with, someone who has hepatitis B.  You get hemodialysis treatment.  You take certain medicines for conditions like cancer, organ transplantation, and autoimmune conditions.  Hepatitis C blood testing is recommended for all people born from 1945 through 1965 and any individual with known risks for hepatitis C.  Practice safe sex. Use condoms and avoid high-risk sexual practices to reduce the spread of sexually transmitted infections (STIs). STIs include gonorrhea, chlamydia, syphilis, trichomonas, herpes, HPV, and human immunodeficiency virus (HIV). Herpes, HIV, and HPV are viral illnesses that have no cure. They can result in disability, cancer, and death.  You should be screened for sexually transmitted illnesses (STIs) including gonorrhea and chlamydia if:  You are sexually active and are younger than 24 years.  You are older than 24 years and your health care provider tells you that you are at risk for this type of infection.  Your sexual activity has changed since you were last screened and you are at an increased risk for chlamydia or gonorrhea. Ask your health care provider if you are at risk.  If you are at risk of being infected with HIV, it is recommended that you take a prescription medicine daily to prevent HIV infection. This is  called preexposure prophylaxis (PrEP). You are considered at risk if:  You are a heterosexual woman, are sexually active, and are at increased risk for HIV infection.  You take drugs by injection.  You are sexually active with a partner who has HIV.  Talk with your health care provider about whether you are at high risk of being infected with HIV. If you choose to begin PrEP, you should first be tested for HIV. You should then be tested every 3 months for as long as you are taking PrEP.  Osteoporosis is a disease in which the bones lose minerals and strength   with aging. This can result in serious bone fractures or breaks. The risk of osteoporosis can be identified using a bone density scan. Women ages 65 years and over and women at risk for fractures or osteoporosis should discuss screening with their health care providers. Ask your health care provider whether you should take a calcium supplement or vitamin D to reduce the rate of osteoporosis.  Menopause can be associated with physical symptoms and risks. Hormone replacement therapy is available to decrease symptoms and risks. You should talk to your health care provider about whether hormone replacement therapy is right for you.  Use sunscreen. Apply sunscreen liberally and repeatedly throughout the day. You should seek shade when your shadow is shorter than you. Protect yourself by wearing long sleeves, pants, a wide-brimmed hat, and sunglasses year round, whenever you are outdoors.  Once a month, do a whole body skin exam, using a mirror to look at the skin on your back. Tell your health care provider of new moles, moles that have irregular borders, moles that are larger than a pencil eraser, or moles that have changed in shape or color.  Stay current with required vaccines (immunizations).  Influenza vaccine. All adults should be immunized every year.  Tetanus, diphtheria, and acellular pertussis (Td, Tdap) vaccine. Pregnant women should  receive 1 dose of Tdap vaccine during each pregnancy. The dose should be obtained regardless of the length of time since the last dose. Immunization is preferred during the 27th-36th week of gestation. An adult who has not previously received Tdap or who does not know her vaccine status should receive 1 dose of Tdap. This initial dose should be followed by tetanus and diphtheria toxoids (Td) booster doses every 10 years. Adults with an unknown or incomplete history of completing a 3-dose immunization series with Td-containing vaccines should begin or complete a primary immunization series including a Tdap dose. Adults should receive a Td booster every 10 years.  Varicella vaccine. An adult without evidence of immunity to varicella should receive 2 doses or a second dose if she has previously received 1 dose. Pregnant females who do not have evidence of immunity should receive the first dose after pregnancy. This first dose should be obtained before leaving the health care facility. The second dose should be obtained 4-8 weeks after the first dose.  Human papillomavirus (HPV) vaccine. Females aged 13-26 years who have not received the vaccine previously should obtain the 3-dose series. The vaccine is not recommended for use in pregnant females. However, pregnancy testing is not needed before receiving a dose. If a female is found to be pregnant after receiving a dose, no treatment is needed. In that case, the remaining doses should be delayed until after the pregnancy. Immunization is recommended for any person with an immunocompromised condition through the age of 26 years if she did not get any or all doses earlier. During the 3-dose series, the second dose should be obtained 4-8 weeks after the first dose. The third dose should be obtained 24 weeks after the first dose and 16 weeks after the second dose.  Zoster vaccine. One dose is recommended for adults aged 60 years or older unless certain conditions are  present.  Measles, mumps, and rubella (MMR) vaccine. Adults born before 1957 generally are considered immune to measles and mumps. Adults born in 1957 or later should have 1 or more doses of MMR vaccine unless there is a contraindication to the vaccine or there is laboratory evidence of immunity to   each of the three diseases. A routine second dose of MMR vaccine should be obtained at least 28 days after the first dose for students attending postsecondary schools, health care workers, or international travelers. People who received inactivated measles vaccine or an unknown type of measles vaccine during 1963-1967 should receive 2 doses of MMR vaccine. People who received inactivated mumps vaccine or an unknown type of mumps vaccine before 1979 and are at high risk for mumps infection should consider immunization with 2 doses of MMR vaccine. For females of childbearing age, rubella immunity should be determined. If there is no evidence of immunity, females who are not pregnant should be vaccinated. If there is no evidence of immunity, females who are pregnant should delay immunization until after pregnancy. Unvaccinated health care workers born before 1957 who lack laboratory evidence of measles, mumps, or rubella immunity or laboratory confirmation of disease should consider measles and mumps immunization with 2 doses of MMR vaccine or rubella immunization with 1 dose of MMR vaccine.  Pneumococcal 13-valent conjugate (PCV13) vaccine. When indicated, a person who is uncertain of her immunization history and has no record of immunization should receive the PCV13 vaccine. An adult aged 19 years or older who has certain medical conditions and has not been previously immunized should receive 1 dose of PCV13 vaccine. This PCV13 should be followed with a dose of pneumococcal polysaccharide (PPSV23) vaccine. The PPSV23 vaccine dose should be obtained at least 8 weeks after the dose of PCV13 vaccine. An adult aged 19  years or older who has certain medical conditions and previously received 1 or more doses of PPSV23 vaccine should receive 1 dose of PCV13. The PCV13 vaccine dose should be obtained 1 or more years after the last PPSV23 vaccine dose.  Pneumococcal polysaccharide (PPSV23) vaccine. When PCV13 is also indicated, PCV13 should be obtained first. All adults aged 65 years and older should be immunized. An adult younger than age 65 years who has certain medical conditions should be immunized. Any person who resides in a nursing home or long-term care facility should be immunized. An adult smoker should be immunized. People with an immunocompromised condition and certain other conditions should receive both PCV13 and PPSV23 vaccines. People with human immunodeficiency virus (HIV) infection should be immunized as soon as possible after diagnosis. Immunization during chemotherapy or radiation therapy should be avoided. Routine use of PPSV23 vaccine is not recommended for American Indians, [place] Natives, or people younger than 65 years unless there are medical conditions that require PPSV23 vaccine. When indicated, people who have unknown immunization and have no record of immunization should receive PPSV23 vaccine. One-time revaccination 5 years after the first dose of PPSV23 is recommended for people aged 19-64 years who have chronic kidney failure, nephrotic syndrome, asplenia, or immunocompromised conditions. People who received 1-2 doses of PPSV23 before age 65 years should receive another dose of PPSV23 vaccine at age 65 years or later if at least 5 years have passed since the previous dose. Doses of PPSV23 are not needed for people immunized with PPSV23 at or after age 65 years.  Meningococcal vaccine. Adults with asplenia or persistent complement component deficiencies should receive 2 doses of quadrivalent meningococcal conjugate (MenACWY-D) vaccine. The doses should be obtained at least 2 months apart.  Microbiologists working with certain meningococcal bacteria, military recruits, people at risk during an outbreak, and people who travel to or live in countries with a high rate of meningitis should be immunized. A first-year college student up through age   21 years who is living in a residence hall should receive a dose if she did not receive a dose on or after her 16th birthday. Adults who have certain high-risk conditions should receive one or more doses of vaccine.  Hepatitis A vaccine. Adults who wish to be protected from this disease, have certain high-risk conditions, work with hepatitis A-infected animals, work in hepatitis A research labs, or travel to or work in countries with a high rate of hepatitis A should be immunized. Adults who were previously unvaccinated and who anticipate close contact with an international adoptee during the first 60 days after arrival in the [place] from a country with a high rate of hepatitis A should be immunized.  Hepatitis B vaccine. Adults who wish to be protected from this disease, have certain high-risk conditions, may be exposed to blood or other infectious body fluids, are household contacts or sex partners of hepatitis B positive people, are clients or workers in certain care facilities, or travel to or work in countries with a high rate of hepatitis B should be immunized.  Haemophilus influenzae type b (Hib) vaccine. A previously unvaccinated person with asplenia or sickle cell disease or having a scheduled splenectomy should receive 1 dose of Hib vaccine. Regardless of previous immunization, a recipient of a hematopoietic stem cell transplant should receive a 3-dose series 6-12 months after her successful transplant. Hib vaccine is not recommended for adults with HIV infection. Preventive Services / Frequency Ages 64 to 68 years  Blood pressure check.** / Every 1 to 2 years.  Lipid and cholesterol check.** / Every 5 years beginning at age  22.  Clinical breast exam.** / Every 3 years for women in their 88s and 53s.  BRCA-related cancer risk assessment.** / For women who have family members with a BRCA-related cancer (breast, ovarian, tubal, or peritoneal cancers).  Pap test.** / Every 2 years from ages 90 through 51. Every 3 years starting at age 21 through age 56 or 3 with a history of 3 consecutive normal Pap tests.  HPV screening.** / Every 3 years from ages 24 through ages 1 to 46 with a history of 3 consecutive normal Pap tests.  Hepatitis C blood test.** / For any individual with known risks for hepatitis C.  Skin self-exam. / Monthly.  Influenza vaccine. / Every year.  Tetanus, diphtheria, and acellular pertussis (Tdap, Td) vaccine.** / Consult your health care provider. Pregnant women should receive 1 dose of Tdap vaccine during each pregnancy. 1 dose of Td every 10 years.  Varicella vaccine.** / Consult your health care provider. Pregnant females who do not have evidence of immunity should receive the first dose after pregnancy.  HPV vaccine. / 3 doses over 6 months, if 72 and younger. The vaccine is not recommended for use in pregnant females. However, pregnancy testing is not needed before receiving a dose.  Measles, mumps, rubella (MMR) vaccine.** / You need at least 1 dose of MMR if you were born in 1957 or later. You may also need a 2nd dose. For females of childbearing age, rubella immunity should be determined. If there is no evidence of immunity, females who are not pregnant should be vaccinated. If there is no evidence of immunity, females who are pregnant should delay immunization until after pregnancy.  Pneumococcal 13-valent conjugate (PCV13) vaccine.** / Consult your health care provider.  Pneumococcal polysaccharide (PPSV23) vaccine.** / 1 to 2 doses if you smoke cigarettes or if you have certain conditions.  Meningococcal vaccine.** /  1 dose if you are age 19 to 21 years and a first-year college  student living in a residence hall, or have one of several medical conditions, you need to get vaccinated against meningococcal disease. You may also need additional booster doses.  Hepatitis A vaccine.** / Consult your health care provider.  Hepatitis B vaccine.** / Consult your health care provider.  Haemophilus influenzae type b (Hib) vaccine.** / Consult your health care provider. Ages 40 to 64 years  Blood pressure check.** / Every 1 to 2 years.  Lipid and cholesterol check.** / Every 5 years beginning at age 20 years.  Lung cancer screening. / Every year if you are aged 55-80 years and have a 30-pack-year history of smoking and currently smoke or have quit within the past 15 years. Yearly screening is stopped once you have quit smoking for at least 15 years or develop a health problem that would prevent you from having lung cancer treatment.  Clinical breast exam.** / Every year after age 40 years.  BRCA-related cancer risk assessment.** / For women who have family members with a BRCA-related cancer (breast, ovarian, tubal, or peritoneal cancers).  Mammogram.** / Every year beginning at age 40 years and continuing for as long as you are in good health. Consult with your health care provider.  Pap test.** / Every 3 years starting at age 30 years through age 65 or 70 years with a history of 3 consecutive normal Pap tests.  HPV screening.** / Every 3 years from ages 30 years through ages 65 to 70 years with a history of 3 consecutive normal Pap tests.  Fecal occult blood test (FOBT) of stool. / Every year beginning at age 50 years and continuing until age 75 years. You may not need to do this test if you get a colonoscopy every 10 years.  Flexible sigmoidoscopy or colonoscopy.** / Every 5 years for a flexible sigmoidoscopy or every 10 years for a colonoscopy beginning at age 50 years and continuing until age 75 years.  Hepatitis C blood test.** / For all people born from 1945 through  1965 and any individual with known risks for hepatitis C.  Skin self-exam. / Monthly.  Influenza vaccine. / Every year.  Tetanus, diphtheria, and acellular pertussis (Tdap/Td) vaccine.** / Consult your health care provider. Pregnant women should receive 1 dose of Tdap vaccine during each pregnancy. 1 dose of Td every 10 years.  Varicella vaccine.** / Consult your health care provider. Pregnant females who do not have evidence of immunity should receive the first dose after pregnancy.  Zoster vaccine.** / 1 dose for adults aged 60 years or older.  Measles, mumps, rubella (MMR) vaccine.** / You need at least 1 dose of MMR if you were born in 1957 or later. You may also need a 2nd dose. For females of childbearing age, rubella immunity should be determined. If there is no evidence of immunity, females who are not pregnant should be vaccinated. If there is no evidence of immunity, females who are pregnant should delay immunization until after pregnancy.  Pneumococcal 13-valent conjugate (PCV13) vaccine.** / Consult your health care provider.  Pneumococcal polysaccharide (PPSV23) vaccine.** / 1 to 2 doses if you smoke cigarettes or if you have certain conditions.  Meningococcal vaccine.** / Consult your health care provider.  Hepatitis A vaccine.** / Consult your health care provider.  Hepatitis B vaccine.** / Consult your health care provider.  Haemophilus influenzae type b (Hib) vaccine.** / Consult your health care provider. Ages 65   years and over  Blood pressure check.** / Every 1 to 2 years.  Lipid and cholesterol check.** / Every 5 years beginning at age 20 years.  Lung cancer screening. / Every year if you are aged 55-80 years and have a 30-pack-year history of smoking and currently smoke or have quit within the past 15 years. Yearly screening is stopped once you have quit smoking for at least 15 years or develop a health problem that would prevent you from having lung cancer  treatment.  Clinical breast exam.** / Every year after age 40 years.  BRCA-related cancer risk assessment.** / For women who have family members with a BRCA-related cancer (breast, ovarian, tubal, or peritoneal cancers).  Mammogram.** / Every year beginning at age 40 years and continuing for as long as you are in good health. Consult with your health care provider.  Pap test.** / Every 3 years starting at age 30 years through age 65 or 70 years with 3 consecutive normal Pap tests. Testing can be stopped between 65 and 70 years with 3 consecutive normal Pap tests and no abnormal Pap or HPV tests in the past 10 years.  HPV screening.** / Every 3 years from ages 30 years through ages 65 or 70 years with a history of 3 consecutive normal Pap tests. Testing can be stopped between 65 and 70 years with 3 consecutive normal Pap tests and no abnormal Pap or HPV tests in the past 10 years.  Fecal occult blood test (FOBT) of stool. / Every year beginning at age 50 years and continuing until age 75 years. You may not need to do this test if you get a colonoscopy every 10 years.  Flexible sigmoidoscopy or colonoscopy.** / Every 5 years for a flexible sigmoidoscopy or every 10 years for a colonoscopy beginning at age 50 years and continuing until age 75 years.  Hepatitis C blood test.** / For all people born from 1945 through 1965 and any individual with known risks for hepatitis C.  Osteoporosis screening.** / A one-time screening for women ages 65 years and over and women at risk for fractures or osteoporosis.  Skin self-exam. / Monthly.  Influenza vaccine. / Every year.  Tetanus, diphtheria, and acellular pertussis (Tdap/Td) vaccine.** / 1 dose of Td every 10 years.  Varicella vaccine.** / Consult your health care provider.  Zoster vaccine.** / 1 dose for adults aged 60 years or older.  Pneumococcal 13-valent conjugate (PCV13) vaccine.** / Consult your health care provider.  Pneumococcal  polysaccharide (PPSV23) vaccine.** / 1 dose for all adults aged 65 years and older.  Meningococcal vaccine.** / Consult your health care provider.  Hepatitis A vaccine.** / Consult your health care provider.  Hepatitis B vaccine.** / Consult your health care provider.  Haemophilus influenzae type b (Hib) vaccine.** / Consult your health care provider. ** Family history and personal history of risk and conditions may change your health care provider's recommendations. Document Released: 02/24/2001 Document Revised: 05/15/2013 Document Reviewed: 05/26/2010 Exit[employer] Patient Information 2015 [employer]. This information is not intended to replace advice given to you by your health care provider. Make sure you discuss any questions you have with your health care provider.   

## 2014-04-10 NOTE — Progress Notes (Signed)
Pre visit review using our clinic review tool, if applicable. No additional management support is needed unless otherwise documented below in the visit note. 

## 2014-04-10 NOTE — Progress Notes (Signed)
Subjective:   [name] is a 64 y.o. female who presents for Medicare Annual (Subsequent) preventive examination.  Review of Systems:   Review of Systems  Constitutional: Negative for activity change, appetite change and fatigue.  HENT: Negative for hearing loss, congestion, tinnitus and ear discharge.   Eyes: Negative for visual disturbance (see optho q1y -- vision corrected to 20/20 with glasses).  Respiratory: Negative for cough, chest tightness and shortness of breath.   Cardiovascular: Negative for chest pain, palpitations and leg swelling.  Gastrointestinal: Negative for abdominal pain, diarrhea, constipation and abdominal distention.  Genitourinary: Negative for urgency, frequency, decreased urine volume and difficulty urinating.  Musculoskeletal: Negative for back pain, arthralgias and gait problem.  Skin: Negative for color change, pallor and rash.  Neurological: Negative for dizziness, light-headedness, numbness and headaches.  Hematological: Negative for adenopathy. Does not bruise/bleed easily.  Psychiatric/Behavioral: Negative for suicidal ideas, confusion, sleep disturbance, self-injury, dysphoric mood, decreased concentration and agitation.  Pt is able to read and write and can do all ADLs No risk for falling No abuse/ violence in home           Objective:     Vitals: BP 140/88 mmHg  Pulse 78  Temp(Src) 97.4 F (36.3 C) (Oral)  Ht 4\' 8"  (1.422 m)  Wt 144 lb (65.318 kg)  BMI 32.30 kg/m2  SpO2 97% BP 140/88 mmHg  Pulse 78  Temp(Src) 97.4 F (36.3 C) (Oral)  Ht 4\' 8"  (1.422 m)  Wt 144 lb (65.318 kg)  BMI 32.30 kg/m2  SpO2 97% General appearance: alert, cooperative, appears stated age and no distress Head: Normocephalic, without obvious abnormality, atraumatic Eyes: conjunctivae/corneas clear. PERRL, EOM's intact. Fundi benign. Ears: normal TM's and external ear canals both ears Nose: Nares normal. Septum midline. Mucosa normal. No drainage or  sinus tenderness. Throat: lips, mucosa, and tongue normal; teeth and gums normal Neck: no adenopathy, no carotid bruit, no JVD, supple, symmetrical, trachea midline and thyroid not enlarged, symmetric, no tenderness/mass/nodules Back: symmetric, no curvature. ROM normal. No CVA tenderness. Lungs: clear to auscultation bilaterally Breasts: normal appearance, no masses or tenderness Heart: regular rate and rhythm, S1, S2 normal, no murmur, click, rub or gallop Abdomen: soft, non-tender; bowel sounds normal; no masses,  no organomegaly Pelvic: deferred Extremities: extremities normal, atraumatic, no cyanosis or edema Pulses: 2+ and symmetric Skin: Skin color, texture, turgor normal. No rashes or lesions Lymph nodes: Cervical, supraclavicular, and axillary nodes normal. Neurologic: Alert and oriented X 3, normal strength and tone. Normal symmetric reflexes. Normal coordination and gait Psych- no depression , no anxiety Tobacco History  Smoking status  . Former Smoker -- 0.25 packs/day for 20 years  . Types: Cigarettes  . Quit date: 01/13/1988  Smokeless tobacco  . Never Used     Counseling given: Not Answered   Past Medical History  Diagnosis Date  . ADD (attention deficit disorder)   . Depression   . GERD (gastroesophageal reflux disease)   . Hyperlipemia     "borderline"  . IBS (irritable bowel syndrome)     chronic constipation  . Fibromyalgia   . Osteoporosis   . Bronchitis     hx of  . Hepatitis     B  . Cataract of left eye     since birth  . Osteoarthritis    Past Surgical History  Procedure Laterality Date  . Cholecystectomy    . Cesarean section      x4  . Tonsillectomy    .  Total knee arthroplasty Right 2009    [facility]  . Toe amputation  2007  . Ventral hernia repair  2007    "with human screen"  . Carpal tunnel release Bilateral   . Bladder suspension  2001  . Abdominal hysterectomy  2001  . Upper gi endoscopy    . Nose surgery Left 2007    "benign  tumor coming from left nostril"  . Breast surgery      several tumors removed  . Total knee arthroplasty Left 12/26/2012    Procedure: LEFT TOTAL KNEE ARTHROPLASTY;  Surgeon: Mauri Pole, MD;  Location: [facility];  Service: Orthopedics;  Laterality: Left;   Family History  Problem Relation Age of Onset  . Prostate cancer Paternal Grandfather   . Lung cancer Paternal Grandfather   . Arthritis Mother   . Heart disease Mother     atrial fib  . Cancer Maternal Uncle     prostate  . Hypertension Maternal Grandmother   . Cancer Maternal Grandfather     prostate. lung  . Hypertension Paternal Grandmother    History  Sexual Activity  . Sexual Activity:  . Partners: Male    Outpatient Encounter Prescriptions as of 04/10/2014  Medication Sig  . alendronate (FOSAMAX) 70 MG tablet TAKE 1 TABLET EVERY WEEK. TAKE WITH FULL GLASS OF WATER ON AN EMPTY STOMACH  . ARIPiprazole (ABILIFY) 2 MG tablet Take 0.5 tablets (1 mg total) by mouth daily.  Marland Kitchen CALCIUM-MAGNESIUM-ZINC PO Take 2 capsules by mouth at bedtime.  . clonazePAM (KLONOPIN) 1 MG tablet Take 1 tablet (1 mg total) by mouth at bedtime.  . conjugated estrogens (PREMARIN) vaginal cream 1 g pv qd for 2 weeks then decrease to every other day.  Wean down to 0.5 g  2-3 days a week if possible  . cyclobenzaprine (FLEXERIL) 10 MG tablet Take 1 tablet (10 mg total) by mouth 2 (two) times daily as needed for muscle spasms.  Marland Kitchen docusate sodium 100 MG CAPS Take 100 mg by mouth 2 (two) times daily.  Marland Kitchen FLUoxetine (PROZAC) 20 MG tablet Take 1 tablet (20 mg total) by mouth daily.  . fluticasone (FLONASE) 50 MCG/ACT nasal spray Place 2 sprays into both nostrils daily.  Marland Kitchen gabapentin (NEURONTIN) 300 MG capsule Take 1 capsule (300 mg total) by mouth 3 (three) times daily.  . hydrochlorothiazide (HYDRODIURIL) 25 MG tablet Take 1 tablet (25 mg total) by mouth daily.  Marland Kitchen HYDROcodone-acetaminophen (NORCO) 7.5-325 MG per tablet Take 1-2 tablets by mouth every 4  (four) hours as needed for moderate pain.  . hyoscyamine (LEVSIN/SL) 0.125 MG SL tablet Place 1 tablet (0.125 mg total) under the tongue every 4 (four) hours as needed.  . meloxicam (MOBIC) 15 MG tablet Take 1 tablet (15 mg total) by mouth daily.  . methylphenidate (RITALIN) 10 MG tablet Take 1 tablet (10 mg total) by mouth 3 (three) times daily with meals.  . Multiple Vitamin (MULTIVITAMIN WITH MINERALS) TABS tablet Take 1 tablet by mouth daily.  . niacin (GNP NIACIN) 250 MG tablet Take 500 mg by mouth daily with breakfast.   . Omega-3 Fatty Acids (FISH OIL) 1200 MG CAPS Take 2,400 capsules by mouth daily.  Marland Kitchen omeprazole (PRILOSEC) 40 MG capsule Take 1 capsule (40 mg total) by mouth daily.  Marland Kitchen OVER THE COUNTER MEDICATION Take 2 tablets by mouth daily. TUMERIC  . oxyCODONE (OXY IR/ROXICODONE) 5 MG immediate release tablet Take 1 tablet by mouth as needed.  . polyethylene glycol (MIRALAX /  GLYCOLAX) packet Take 17 g by mouth 2 (two) times daily.  . traMADol (ULTRAM) 50 MG tablet Take 1 tablet (50 mg total) by mouth every 6 (six) hours as needed.  . valACYclovir (VALTREX) 500 MG tablet Take 1 tablet (500 mg total) by mouth daily.  Marland Kitchen zolpidem (AMBIEN) 5 MG tablet Take 1 tablet (5 mg total) by mouth at bedtime as needed.  . [DISCONTINUED] alendronate (FOSAMAX) 70 MG tablet TAKE 1 TABLET EVERY WEEK. TAKE WITH FULL GLASS OF WATER ON AN EMPTY STOMACH  . [DISCONTINUED] ARIPiprazole (ABILIFY) 2 MG tablet Take 1 mg by mouth daily.  . [DISCONTINUED] clonazePAM (KLONOPIN) 1 MG tablet Take 1 tablet (1 mg total) by mouth at bedtime.  . [DISCONTINUED] clonazePAM (KLONOPIN) 1 MG tablet Take 1 tablet (1 mg total) by mouth at bedtime.  . [DISCONTINUED] conjugated estrogens (PREMARIN) vaginal cream 1 g pv qd for 2 weeks then decrease to every other day.  Wean down to 0.5 g  2-3 days a week if possible  . [DISCONTINUED] cyclobenzaprine (FLEXERIL) 10 MG tablet Take 1 tablet (10 mg total) by mouth 2 (two) times daily as  needed for muscle spasms.  . [DISCONTINUED] EPINEPHrine (EPIPEN) 0.3 mg/0.3 mL DEVI Inject 0.3 mLs (0.3 mg total) into the muscle once.  . [DISCONTINUED] FLUoxetine (PROZAC) 20 MG tablet Take 1 tablet (20 mg total) by mouth daily.  . [DISCONTINUED] fluticasone (FLONASE) 50 MCG/ACT nasal spray Place 2 sprays into both nostrils daily.  . [DISCONTINUED] gabapentin (NEURONTIN) 300 MG capsule Take 1 capsule (300 mg total) by mouth 3 (three) times daily. (Patient taking differently: Take 300 mg by mouth 3 (three) times daily. 04/04/14 PT IS TAKING 2 TWICE A DAY.)  . [DISCONTINUED] hydrochlorothiazide (HYDRODIURIL) 25 MG tablet Take 1 tablet (25 mg total) by mouth daily.  . [DISCONTINUED] hyoscyamine (LEVSIN/SL) 0.125 MG SL tablet Place 1 tablet (0.125 mg total) under the tongue every 4 (four) hours as needed.  . [DISCONTINUED] meloxicam (MOBIC) 15 MG tablet TAKE 1 TABLET EVERY DAY  . [DISCONTINUED] methylphenidate (RITALIN) 10 MG tablet Take 1 tablet (10 mg total) by mouth 3 (three) times daily with meals.  . [DISCONTINUED] nitrofurantoin, macrocrystal-monohydrate, (MACROBID) 100 MG capsule Take 1 capsule (100 mg total) by mouth 2 (two) times daily.  . [DISCONTINUED] omeprazole (PRILOSEC) 40 MG capsule Take 1 capsule by mouth daily.  . [DISCONTINUED] traMADol (ULTRAM) 50 MG tablet Take 1 tablet (50 mg total) by mouth every 6 (six) hours as needed.  . [DISCONTINUED] valACYclovir (VALTREX) 500 MG tablet Take 1 tablet (500 mg total) by mouth daily.  . [DISCONTINUED] zolpidem (AMBIEN) 5 MG tablet Take 1 tablet by mouth at bedtime as needed.  Marland Kitchen EPINEPHrine (EPIPEN 2-PAK) 0.3 mg/0.3 mL IJ SOAJ injection As directed  . levofloxacin (LEVAQUIN) 250 MG tablet Take 1 tablet (250 mg total) by mouth daily.    Activities of Daily Living In your present state of health, do you have any difficulty performing the following activities: 04/10/2014  Is the patient deaf or have difficulty hearing? N  Hearing N  Vision Y    Difficulty concentrating or making decisions N  Walking or climbing stairs? N  Doing errands, shopping? N    Patient Care Team: Rosalita Chessman, DO as PCP - General    Assessment:    1. cpe Exercise Activities and Dietary recommendations--- con't current exercise    Goals    None     Fall Risk Fall Risk  03/15/2013  Falls in the  past year? Yes   Depression Screen PHQ 2/9 Scores 03/15/2013  PHQ - 2 Score 0     Cognitive Testing No flowsheet data found.  Immunization History  Administered Date(s) Administered  . Influenza Split 09/29/2011  . Influenza Whole 11/12/2008, 09/30/2009, 09/22/2010  . Influenza,inj,Quad PF,36+ Mos 11/04/2012  . Influenza-Unspecified 09/12/2013  . Pneumococcal Polysaccharide-23 03/15/2013  . Td 01/26/2001  . Tdap 09/22/2010   Screening Tests Health Maintenance  Topic Date Due  . ZOSTAVAX  04/10/2015 (Originally 01/13/2010)  . INFLUENZA VACCINE  08/13/2014  . MAMMOGRAM  02/25/2015  . COLONOSCOPY  01/22/2019  . TETANUS/TDAP  09/21/2020  . HIV Screening  Completed      Plan:    see AVs  ghm utd Check labs During the course of the visit the patient was educated and counseled about the following appropriate screening and preventive services:   Vaccines to include Pneumoccal, Influenza, Hepatitis B, Td, Zostavax, HCV  Electrocardiogram  Cardiovascular Disease  Colorectal cancer screening  Bone density screening  Diabetes screening  Glaucoma screening  Mammography/PAP  Nutrition counseling   Patient Instructions (the written plan) was given to the patient.  1. Urinary tract infection without hematuria, site unspecified  - levofloxacin (LEVAQUIN) 250 MG tablet; Take 1 tablet (250 mg total) by mouth daily.  Dispense: 5 tablet; Refill: 0 - POCT urinalysis dipstick  2. Hyperlipidemia con't meds , check labs - Hepatic function panel - Lipid panel  3. Preventative health care   4. Attention-deficit hyperactivity disorder,  other type Refill meds-- ritalin  5. Insomnia  - zolpidem (AMBIEN) 5 MG tablet; Take 1 tablet (5 mg total) by mouth at bedtime as needed.  Dispense: 90 tablet; Refill: 0 - clonazePAM (KLONOPIN) 1 MG tablet; Take 1 tablet (1 mg total) by mouth at bedtime.  Dispense: 30 tablet; Refill: 0  6. Vaginal dryness  - conjugated estrogens (PREMARIN) vaginal cream; 1 g pv qd for 2 weeks then decrease to every other day.  Wean down to 0.5 g  2-3 days a week if possible  Dispense: 42.5 g; Refill: 3  7. Muscle spasms of neck  - cyclobenzaprine (FLEXERIL) 10 MG tablet; Take 1 tablet (10 mg total) by mouth 2 (two) times daily as needed for muscle spasms.  Dispense: 180 tablet; Refill: 0 - meloxicam (MOBIC) 15 MG tablet; Take 1 tablet (15 mg total) by mouth daily.  Dispense: 90 tablet; Refill: 3  8. Depression  - ARIPiprazole (ABILIFY) 2 MG tablet; Take 0.5 tablets (1 mg total) by mouth daily.  Dispense: 45 tablet; Refill: 3 - FLUoxetine (PROZAC) 20 MG tablet; Take 1 tablet (20 mg total) by mouth daily.  Dispense: 90 tablet; Refill: 3  9. Chest pain, unspecified chest pain type  - gabapentin (NEURONTIN) 300 MG capsule; Take 1 capsule (300 mg total) by mouth 3 (three) times daily.  Dispense: 270 capsule; Refill: 3  10. ADD (attention deficit disorder)  - methylphenidate (RITALIN) 10 MG tablet; Take 1 tablet (10 mg total) by mouth 3 (three) times daily with meals.  Dispense: 270 tablet; Refill: 0  11. Chronic pain syndrome  - traMADol (ULTRAM) 50 MG tablet; Take 1 tablet (50 mg total) by mouth every 6 (six) hours as needed.  Dispense: 120 tablet; Refill: 1  12. Herpes genitalia  - valACYclovir (VALTREX) 500 MG tablet; Take 1 tablet (500 mg total) by mouth daily.  Dispense: 90 tablet; Refill: 3  13. Anaphylactic reaction, initial encounter  - EPINEPHrine (EPIPEN 2-PAK) 0.3 mg/0.3 mL IJ SOAJ  injection; As directed  Dispense: 1 Device; Refill: 1  14. Insomnia  - zolpidem (AMBIEN) 5 MG tablet;  Take 1 tablet (5 mg total) by mouth at bedtime as needed.  Dispense: 90 tablet; Refill: 0 - clonazePAM (KLONOPIN) 1 MG tablet; Take 1 tablet (1 mg total) by mouth at bedtime.  Dispense: 30 tablet; Refill: 0  15. Osteopenia  - alendronate (FOSAMAX) 70 MG tablet; TAKE 1 TABLET EVERY WEEK. TAKE WITH FULL GLASS OF WATER ON AN EMPTY STOMACH  Dispense: 12 tablet; Refill: 3  16. Seasonal allergic rhinitis  - fluticasone (FLONASE) 50 MCG/ACT nasal spray; Place 2 sprays into both nostrils daily.  Dispense: 48 g; Refill: 3  17. Essential hypertension stable - hydrochlorothiazide (HYDRODIURIL) 25 MG tablet; Take 1 tablet (25 mg total) by mouth daily.  Dispense: 90 tablet; Refill: 3 - Basic metabolic panel - CBC with Differential/Platelet  18. IBS (irritable bowel syndrome)  - hyoscyamine (LEVSIN/SL) 0.125 MG SL tablet; Place 1 tablet (0.125 mg total) under the tongue every 4 (four) hours as needed.  Dispense: 90 tablet; Refill: 1  19. Gastroesophageal reflux disease without esophagitis  - omeprazole (PRILOSEC) 40 MG capsule; Take 1 capsule (40 mg total) by mouth daily.  Dispense: 90 capsule; Refill: 3  20. Hyperglycemia  - Hemoglobin A1c  Garnet Koyanagi, DO  04/10/2014

## 2014-04-11 ENCOUNTER — Telehealth: Payer: Self-pay | Admitting: Family Medicine

## 2014-04-11 DIAGNOSIS — N390 Urinary tract infection, site not specified: Secondary | ICD-10-CM

## 2014-04-11 LAB — LIPID PANEL
Cholesterol: 238 mg/dL — ABNORMAL HIGH (ref 0–200)
HDL: 45.6 mg/dL (ref >39.00)
NonHDL: 192.4
Total CHOL/HDL Ratio: 5
Triglycerides: 234 mg/dL — ABNORMAL HIGH (ref 0.0–149.0)
VLDL: 46.8 mg/dL — ABNORMAL HIGH (ref 0.0–40.0)

## 2014-04-11 LAB — CBC WITH DIFFERENTIAL/PLATELET
BASOS ABS: 0.1 10*3/uL (ref 0.0–0.1)
Basophils Relative: 0.8 % (ref 0.0–3.0)
Eosinophils Absolute: 0.2 10*3/uL (ref 0.0–0.7)
Eosinophils Relative: 2.6 % (ref 0.0–5.0)
HEMATOCRIT: 42.9 % (ref 36.0–46.0)
HEMOGLOBIN: 14.4 g/dL (ref 12.0–15.0)
LYMPHS PCT: 36.1 % (ref 12.0–46.0)
Lymphs Abs: 3.4 10*3/uL (ref 0.7–4.0)
MCHC: 33.6 g/dL (ref 30.0–36.0)
MCV: 85 fl (ref 78.0–100.0)
MONO ABS: 0.7 10*3/uL (ref 0.1–1.0)
MONOS PCT: 7 % (ref 3.0–12.0)
NEUTROS ABS: 5 10*3/uL (ref 1.4–7.7)
NEUTROS PCT: 53.5 % (ref 43.0–77.0)
Platelets: 396 10*3/uL (ref 150.0–400.0)
RBC: 5.05 Mil/uL (ref 3.87–5.11)
RDW: 13.4 % (ref 11.5–15.5)
WBC: 9.3 10*3/uL (ref 4.0–10.5)

## 2014-04-11 LAB — BASIC METABOLIC PANEL
BUN: 10 mg/dL (ref 6–23)
CALCIUM: 9.9 mg/dL (ref 8.4–10.5)
CO2: 29 mEq/L (ref 19–32)
Chloride: 102 mEq/L (ref 96–112)
Creatinine, Ser: 0.88 mg/dL (ref 0.40–1.20)
GFR: 68.71 mL/min (ref >60.00)
GLUCOSE: 90 mg/dL (ref 70–99)
POTASSIUM: 3.7 meq/L (ref 3.5–5.1)
SODIUM: 137 meq/L (ref 135–145)

## 2014-04-11 LAB — HEMOGLOBIN A1C: Hgb A1c MFr Bld: 5.8 % (ref 4.6–6.5)

## 2014-04-11 LAB — HEPATIC FUNCTION PANEL
ALK PHOS: 75 U/L (ref 39–117)
ALT: 27 U/L (ref 0–35)
AST: 28 U/L (ref 0–37)
Albumin: 4.4 g/dL (ref 3.5–5.2)
BILIRUBIN DIRECT: 0.1 mg/dL (ref 0.0–0.3)
BILIRUBIN TOTAL: 0.6 mg/dL (ref 0.2–1.2)
TOTAL PROTEIN: 7.7 g/dL (ref 6.0–8.3)

## 2014-04-11 LAB — LDL CHOLESTEROL, DIRECT: Direct LDL: 141 mg/dL

## 2014-04-11 MED ORDER — LEVOFLOXACIN 250 MG PO TABS: 250.0000 mg | ORAL_TABLET | Freq: Every day | ORAL | Status: DC

## 2014-04-11 NOTE — Telephone Encounter (Signed)
Rx resent.

## 2014-04-11 NOTE — Telephone Encounter (Signed)
Caller name: ltanya Relation to pt: self Call back number: [phone] Pharmacy:  Reason for call:   Patient states that levequin should have been sent to [facility] on [address]

## 2014-04-13 ENCOUNTER — Telehealth: Payer: Self-pay | Admitting: Family Medicine

## 2014-04-13 ENCOUNTER — Other Ambulatory Visit: Payer: Self-pay

## 2014-04-13 MED ORDER — FENOFIBRATE 160 MG PO TABS: 160.0000 mg | ORAL_TABLET | Freq: Every day | ORAL | Status: DC

## 2014-04-13 NOTE — Telephone Encounter (Signed)
error 

## 2014-05-31 ENCOUNTER — Telehealth: Payer: Self-pay | Admitting: Family Medicine

## 2014-05-31 NOTE — Telephone Encounter (Signed)
rx for ritalin 10 mg 02-27-2014 not pick up  Shredded

## 2014-06-13 ENCOUNTER — Encounter: Payer: Self-pay | Admitting: Family Medicine

## 2014-06-13 ENCOUNTER — Telehealth: Payer: Self-pay | Admitting: *Deleted

## 2014-06-13 NOTE — Telephone Encounter (Signed)
Mary Please schedule this patient an apt with Dr.Lowne .      KP

## 2014-06-13 NOTE — Telephone Encounter (Signed)
She needs an appointment ----  Happy to get her to urology for urinary issues----- if abd pain is severe she should not wait and go to ER

## 2014-06-13 NOTE — Telephone Encounter (Signed)
error 

## 2014-06-13 NOTE — Telephone Encounter (Signed)
Spoke with patient and she scheduled an appointment Monday 06/18/14.    Agrees to go to ER if pain becomes severe.

## 2014-06-18 ENCOUNTER — Ambulatory Visit (INDEPENDENT_AMBULATORY_CARE_PROVIDER_SITE_OTHER): Payer: Commercial Managed Care - HMO | Admitting: Family Medicine

## 2014-06-18 ENCOUNTER — Other Ambulatory Visit: Payer: Self-pay | Admitting: Family Medicine

## 2014-06-18 ENCOUNTER — Encounter: Payer: Self-pay | Admitting: Family Medicine

## 2014-06-18 VITALS — BP 118/78 | HR 99 | Temp 98.2°F | Resp 18 | Ht <= 58 in | Wt 150.0 lb

## 2014-06-18 DIAGNOSIS — R350 Frequency of micturition: Secondary | ICD-10-CM

## 2014-06-18 DIAGNOSIS — N898 Other specified noninflammatory disorders of vagina: Secondary | ICD-10-CM | POA: Diagnosis not present

## 2014-06-18 DIAGNOSIS — F988 Other specified behavioral and emotional disorders with onset usually occurring in childhood and adolescence: Secondary | ICD-10-CM

## 2014-06-18 LAB — POCT URINALYSIS DIPSTICK
Blood, UA: NEGATIVE
GLUCOSE UA: NEGATIVE
KETONES UA: NEGATIVE
LEUKOCYTES UA: NEGATIVE
NITRITE UA: NEGATIVE
PROTEIN UA: NEGATIVE
SPEC GRAV UA: 1.015
Urobilinogen, UA: 4
pH, UA: 7

## 2014-06-18 MED ORDER — METHYLPHENIDATE HCL 10 MG PO TABS: 10.0000 mg | ORAL_TABLET | Freq: Three times a day (TID) | ORAL | Status: DC

## 2014-06-18 NOTE — Progress Notes (Signed)
Patient ID: [name], female    DOB: Jan 10, 1951  Age: 64 y.o. MRN: [id number]    Subjective:  Subjective HPI [name] presents for vaginal odor and abd bloating x 3 months.    Review of Systems  Constitutional: Negative for fever, chills, activity change and appetite change.  Gastrointestinal: Negative for abdominal pain and abdominal distention.  Genitourinary: Positive for dysuria, urgency and frequency. Negative for hematuria, flank pain, vaginal discharge, difficulty urinating, genital sores, vaginal pain, menstrual problem, pelvic pain and dyspareunia.  Musculoskeletal: Negative for back pain.    History Past Medical History  Diagnosis Date  . ADD (attention deficit disorder)   . Depression   . GERD (gastroesophageal reflux disease)   . Hyperlipemia     "borderline"  . IBS (irritable bowel syndrome)     chronic constipation  . Fibromyalgia   . Osteoporosis   . Bronchitis     hx of  . Hepatitis     B  . Cataract of left eye     since birth  . Osteoarthritis     She has past surgical history that includes Cholecystectomy; Cesarean section; Tonsillectomy; Total knee arthroplasty (Right, 2009); Toe amputation (2007); Ventral hernia repair (2007); Carpal tunnel release (Bilateral); Bladder suspension (2001); Abdominal hysterectomy (2001); Upper gi endoscopy; Nose surgery (Left, 2007); Breast surgery; and Total knee arthroplasty (Left, 12/26/2012).   Her family history includes Arthritis in her mother; Cancer in her maternal grandfather and maternal uncle; Heart disease in her mother; Hypertension in her maternal grandmother and paternal grandmother; Lung cancer in her paternal grandfather; Prostate cancer in her paternal grandfather.She reports that she quit smoking about 26 years ago. Her smoking use included Cigarettes. She has a 5 pack-year smoking history. She has never used smokeless tobacco. She reports that she drinks alcohol. She reports that she does not  use illicit drugs.  Current Outpatient Prescriptions on File Prior to Visit  Medication Sig Dispense Refill  . alendronate (FOSAMAX) 70 MG tablet TAKE 1 TABLET EVERY WEEK. TAKE WITH FULL GLASS OF WATER ON AN EMPTY STOMACH 12 tablet 3  . ARIPiprazole (ABILIFY) 2 MG tablet Take 0.5 tablets (1 mg total) by mouth daily. (Patient not taking: Reported on 06/18/2014) 45 tablet 3  . CALCIUM-MAGNESIUM-ZINC PO Take 2 capsules by mouth at bedtime.    . clonazePAM (KLONOPIN) 1 MG tablet Take 1 tablet (1 mg total) by mouth at bedtime. 30 tablet 0  . conjugated estrogens (PREMARIN) vaginal cream 1 g pv qd for 2 weeks then decrease to every other day.  Wean down to 0.5 g  2-3 days a week if possible 42.5 g 3  . cyclobenzaprine (FLEXERIL) 10 MG tablet Take 1 tablet (10 mg total) by mouth 2 (two) times daily as needed for muscle spasms. 180 tablet 0  . docusate sodium 100 MG CAPS Take 100 mg by mouth 2 (two) times daily. 10 capsule 0  . EPINEPHrine (EPIPEN 2-PAK) 0.3 mg/0.3 mL IJ SOAJ injection As directed 1 Device 1  . fenofibrate 160 MG tablet Take 1 tablet (160 mg total) by mouth daily. (Patient not taking: Reported on 06/18/2014) 90 tablet 1  . FLUoxetine (PROZAC) 20 MG tablet Take 1 tablet (20 mg total) by mouth daily. 90 tablet 3  . fluticasone (FLONASE) 50 MCG/ACT nasal spray Place 2 sprays into both nostrils daily. 48 g 3  . gabapentin (NEURONTIN) 300 MG capsule Take 1 capsule (300 mg total) by mouth 3 (three) times daily. 270 capsule 3  .  hydrochlorothiazide (HYDRODIURIL) 25 MG tablet Take 1 tablet (25 mg total) by mouth daily. 90 tablet 3  . hyoscyamine (LEVSIN/SL) 0.125 MG SL tablet Place 1 tablet (0.125 mg total) under the tongue every 4 (four) hours as needed. 90 tablet 1  . levofloxacin (LEVAQUIN) 250 MG tablet Take 1 tablet (250 mg total) by mouth daily. 5 tablet 0  . meloxicam (MOBIC) 15 MG tablet Take 1 tablet (15 mg total) by mouth daily. 90 tablet 3  . methylphenidate (RITALIN) 10 MG tablet Take 1  tablet (10 mg total) by mouth 3 (three) times daily with meals. 270 tablet 0  . Multiple Vitamin (MULTIVITAMIN WITH MINERALS) TABS tablet Take 1 tablet by mouth daily.    . niacin (GNP NIACIN) 250 MG tablet Take 500 mg by mouth daily with breakfast.     . Omega-3 Fatty Acids (FISH OIL) 1200 MG CAPS Take 2,400 capsules by mouth daily.    Marland Kitchen omeprazole (PRILOSEC) 40 MG capsule Take 1 capsule (40 mg total) by mouth daily. 90 capsule 3  . OVER THE COUNTER MEDICATION Take 2 tablets by mouth daily. TUMERIC    . oxyCODONE (OXY IR/ROXICODONE) 5 MG immediate release tablet Take 1 tablet by mouth as needed.    . polyethylene glycol (MIRALAX / GLYCOLAX) packet Take 17 g by mouth 2 (two) times daily. 14 each 0  . traMADol (ULTRAM) 50 MG tablet Take 1 tablet (50 mg total) by mouth every 6 (six) hours as needed. 120 tablet 1  . valACYclovir (VALTREX) 500 MG tablet Take 1 tablet (500 mg total) by mouth daily. 90 tablet 3  . zolpidem (AMBIEN) 5 MG tablet Take 1 tablet (5 mg total) by mouth at bedtime as needed. 90 tablet 0   No current facility-administered medications on file prior to visit.     Objective:  Objective Physical Exam  Constitutional: She appears well-developed and well-nourished.  Pulmonary/Chest: No respiratory distress. She has no wheezes. She has no rales. She exhibits no tenderness.  Abdominal: Soft. She exhibits no distension. There is no tenderness. There is no rebound, no guarding and no CVA tenderness.  Genitourinary: Pelvic exam was performed with patient supine. There is no rash, tenderness, lesion or injury on the right labia. There is no rash, tenderness, lesion or injury on the left labia. Cervix exhibits discharge. Cervix exhibits no motion tenderness and no friability. No erythema or tenderness in the vagina. Vaginal discharge found.  + vaginal d/c -- white, thin + odor  Psychiatric: She has a normal mood and affect. Her behavior is normal. Judgment and thought content normal.    BP 118/78 mmHg  Pulse 99  Temp(Src) 98.2 F (36.8 C) (Oral)  Resp 18  Ht 4\' 8"  (1.422 m)  Wt 150 lb (68.04 kg)  BMI 33.65 kg/m2  SpO2 97% Wt Readings from Last 3 Encounters:  06/18/14 150 lb (68.04 kg)  04/10/14 144 lb (65.318 kg)  04/04/14 146 lb 9.6 oz (66.497 kg)     Lab Results  Component Value Date   WBC 9.3 04/10/2014   HGB 14.4 04/10/2014   HCT 42.9 04/10/2014   PLT 396.0 04/10/2014   GLUCOSE 90 04/10/2014   CHOL 238* 04/10/2014   TRIG 234.0* 04/10/2014   HDL 45.60 04/10/2014   LDLDIRECT 141.0 04/10/2014   LDLCALC 127 05/24/2012   ALT 27 04/10/2014   AST 28 04/10/2014   NA 137 04/10/2014   K 3.7 04/10/2014   CL 102 04/10/2014   CREATININE 0.88 04/10/2014  BUN 10 04/10/2014   CO2 29 04/10/2014   TSH 1.24 10/03/2013   INR 0.88 12/19/2012   HGBA1C 5.8 04/10/2014    Dg Bone Density  10/10/2013   CLINICAL DATA:  64 year old postmenopausal white female with history of bilateral knee replacements, hysterectomy and oophorectomy at age 63, and short term corticosteroid use. The patient currently takes calcium and vitamin-D supplement a shin and has been taking Fosamax for the past 6 months. Follow-up exam.  EXAM: DUAL X-RAY ABSORPTIOMETRY (DXA) FOR BONE MINERAL DENSITY  FINDINGS: AP LUMBAR SPINE L4 excluded  Bone Mineral Density (BMD):  0.829 g/cm2  Young Adult T-Score:  -1.7  Z-Score:  -0.1  LEFT FEMUR NECK  Bone Mineral Density (BMD):  0.621 g/cm2  Young Adult T-Score: -2.1  Z-Score:  -0.6  ASSESSMENT: Patient's diagnostic category is LOW BONE MASS by WHO Criteria.  FRACTURE RISK: Unknown  FRAX: World Health Organization FRAX assessment of absolute fracture risk is not calculated for this patient because the patient has a history of bone building therapy.  COMPARISON: Compared to the most recent prior exam dated 09/08/2011 there has been no significant interval change in bone mineral density of the lumbar spine and no significant interval change in bone mineral density  of the left hip.  Since the baseline exam dated 04/30/2009 there has been a significant 5.5% increase in bone mineral density of the lumbar spine and a significant 5.9% increase in bone mineral density of the left hip.  Effective therapies are available in the form of bisphosphonates, selective estrogen receptor modulators, biologic agents, and hormone replacement therapy (for women). All patients should ensure an adequate intake of dietary calcium (1200 mg daily) and vitamin D (800 IU daily) unless contraindicated.  All treatment decisions require clinical judgment and consideration of individual patient factors, including patient preferences, co-morbidities, previous drug use, risk factors not captured in the FRAX model (e.g., frailty, falls, vitamin D deficiency, increased bone turnover, interval significant decline in bone density) and possible under- or over-estimation of fracture risk by FRAX.  The National Osteoporosis Foundation recommends that FDA-approved medical therapies be considered in postmenopausal women and men age 61 or older with a:  1. Hip or vertebral (clinical or morphometric) fracture.  2. T-score of -2.5 or lower at the spine or hip.  3. Ten-year fracture probability by FRAX of 3% or greater for hip fracture or 20% or greater for major osteoporotic fracture.  People with diagnosed cases of osteoporosis or at high risk for fracture should have regular bone mineral density tests. For patients eligible for Medicare, routine testing is allowed once every 2 years. The testing frequency can be increased to one year for patients who have rapidly progressing disease, those who are receiving or discontinuing medical therapy to restore bone mass, or have additional risk factors.  [facility]) Criteria:  Normal: T-scores from +1.0 to -1.0  Low Bone Mass (Osteopenia): T-scores between -1.0 and -2.5  Osteoporosis: T-scores -2.5 and below  Comparison to Reference Population:  T-score is  the key measure used in the diagnosis of osteoporosis and relative risk determination for fracture. It provides a value for bone mass relative to the mean bone mass of a young adult reference population expressed in terms of standard deviation (SD).  Z-score is the age-matched score showing the patient's values compared to a population matched for age, sex, and race. This is also expressed in terms of standard deviation. The patient may have values that compare favorably to the age-matched  values and still be at increased risk for fracture.   Electronically Signed   By: Everlean Alstrom M.D.   On: 10/11/2013 13:41     Assessment & Plan:  Plan I have discontinued Ms. Palmateer's HYDROcodone-acetaminophen. I am also having her maintain her niacin, multivitamin with minerals, Fish Oil, CALCIUM-MAGNESIUM-ZINC PO, OVER THE COUNTER MEDICATION, DSS, polyethylene glycol, oxyCODONE, alendronate, ARIPiprazole, conjugated estrogens, cyclobenzaprine, FLUoxetine, fluticasone, gabapentin, hydrochlorothiazide, hyoscyamine, meloxicam, methylphenidate, omeprazole, traMADol, valACYclovir, zolpidem, EPINEPHrine, clonazePAM, levofloxacin, and fenofibrate.  No orders of the defined types were placed in this encounter.    Problem List Items Addressed This Visit    None    Visit Diagnoses    Urinary frequency    -  Primary    Relevant Orders    POCT Urinalysis Dipstick (Completed)    Vaginal irritation        Relevant Orders    Wet prep, genital    Urine Culture    GC/Chlamydia Probe Amp    HIV antibody    HSV 2 antibody, IgG       Follow-up: Return if symptoms worsen or fail to improve.  Garnet Koyanagi, DO

## 2014-06-18 NOTE — Telephone Encounter (Signed)
Please advise      KP 

## 2014-06-18 NOTE — Patient Instructions (Signed)

## 2014-06-18 NOTE — Telephone Encounter (Signed)
Rx faxed.    KP 

## 2014-06-18 NOTE — Progress Notes (Signed)
Pre visit review using our clinic review tool, if applicable. No additional management support is needed unless otherwise documented below in the visit note. 

## 2014-06-18 NOTE — Telephone Encounter (Signed)
Caller name: wendy Relation to pt: self Call back number: [phone] Pharmacy:  Reason for call:   Requesting ritalin refill

## 2014-06-21 ENCOUNTER — Telehealth: Payer: Self-pay

## 2014-06-21 ENCOUNTER — Encounter: Payer: Self-pay | Admitting: Family Medicine

## 2014-06-21 DIAGNOSIS — A6000 Herpesviral infection of urogenital system, unspecified: Secondary | ICD-10-CM

## 2014-06-21 MED ORDER — METRONIDAZOLE 500 MG PO TABS: 500.0000 mg | ORAL_TABLET | Freq: Two times a day (BID) | ORAL | Status: DC

## 2014-06-21 MED ORDER — VALACYCLOVIR HCL 500 MG PO TABS: 500.0000 mg | ORAL_TABLET | Freq: Every day | ORAL | Status: DC

## 2014-06-21 NOTE — Telephone Encounter (Signed)
To MD for review     KP 

## 2014-06-21 NOTE — Telephone Encounter (Signed)
Result is on your desk

## 2014-06-21 NOTE — Telephone Encounter (Signed)
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 Rx faxed to [facility] on [address] per patient request.    KP

## 2014-06-22 DIAGNOSIS — M5136 Other intervertebral disc degeneration, lumbar region: Secondary | ICD-10-CM | POA: Diagnosis not present

## 2014-06-22 DIAGNOSIS — M9903 Segmental and somatic dysfunction of lumbar region: Secondary | ICD-10-CM | POA: Diagnosis not present

## 2014-06-22 DIAGNOSIS — M9905 Segmental and somatic dysfunction of pelvic region: Secondary | ICD-10-CM | POA: Diagnosis not present

## 2014-06-22 DIAGNOSIS — M5430 Sciatica, unspecified side: Secondary | ICD-10-CM | POA: Diagnosis not present

## 2014-06-22 DIAGNOSIS — M4300 Spondylolysis, site unspecified: Secondary | ICD-10-CM | POA: Diagnosis not present

## 2014-06-22 DIAGNOSIS — M9904 Segmental and somatic dysfunction of sacral region: Secondary | ICD-10-CM | POA: Diagnosis not present

## 2014-06-25 ENCOUNTER — Encounter: Payer: Self-pay | Admitting: Family Medicine

## 2014-06-25 DIAGNOSIS — M549 Dorsalgia, unspecified: Secondary | ICD-10-CM

## 2014-06-25 NOTE — Telephone Encounter (Signed)
Please put new referral in for her for ortho at [facility]  And see if they can see her quickly

## 2014-06-25 NOTE — Telephone Encounter (Signed)
Back pain

## 2014-06-25 NOTE — Telephone Encounter (Signed)
Ref placed.

## 2014-06-25 NOTE — Telephone Encounter (Signed)
I need a diagnosis. Please advise    KP

## 2014-06-25 NOTE — Addendum Note (Signed)
Addended by: Ewing Schlein on: 06/25/2014 04:44 PM   Modules accepted: Orders

## 2014-06-26 NOTE — Telephone Encounter (Signed)
If abd is causing pain --- er Otherwise ov

## 2014-06-27 DIAGNOSIS — M9903 Segmental and somatic dysfunction of lumbar region: Secondary | ICD-10-CM | POA: Diagnosis not present

## 2014-06-27 DIAGNOSIS — M4300 Spondylolysis, site unspecified: Secondary | ICD-10-CM | POA: Diagnosis not present

## 2014-06-27 DIAGNOSIS — M9904 Segmental and somatic dysfunction of sacral region: Secondary | ICD-10-CM | POA: Diagnosis not present

## 2014-06-27 DIAGNOSIS — M5136 Other intervertebral disc degeneration, lumbar region: Secondary | ICD-10-CM | POA: Diagnosis not present

## 2014-06-27 DIAGNOSIS — M5430 Sciatica, unspecified side: Secondary | ICD-10-CM | POA: Diagnosis not present

## 2014-06-27 DIAGNOSIS — M9905 Segmental and somatic dysfunction of pelvic region: Secondary | ICD-10-CM | POA: Diagnosis not present

## 2014-06-29 ENCOUNTER — Encounter: Payer: Self-pay | Admitting: Family Medicine

## 2014-06-29 NOTE — Telephone Encounter (Signed)
Last seen 06/18/14 and filled 04/10/14 UDS 02/02/13 low risk   Please advise     KP

## 2014-06-30 ENCOUNTER — Other Ambulatory Visit: Payer: Self-pay | Admitting: Family Medicine

## 2014-06-30 ENCOUNTER — Encounter: Payer: Self-pay | Admitting: Family Medicine

## 2014-07-02 NOTE — Telephone Encounter (Signed)
Refill klonopin and tramadol x1

## 2014-07-03 ENCOUNTER — Encounter: Payer: Self-pay | Admitting: Family Medicine

## 2014-07-05 ENCOUNTER — Telehealth: Payer: Self-pay | Admitting: Family Medicine

## 2014-07-05 DIAGNOSIS — A6000 Herpesviral infection of urogenital system, unspecified: Secondary | ICD-10-CM

## 2014-07-05 MED ORDER — VALACYCLOVIR HCL 500 MG PO TABS: 500.0000 mg | ORAL_TABLET | Freq: Every day | ORAL | Status: DC

## 2014-07-05 NOTE — Telephone Encounter (Signed)
Rx faxed.    KP 

## 2014-07-05 NOTE — Telephone Encounter (Signed)
Caller name: maven Relation to pt: self Call back number: [phone] Pharmacy: [address] [place], [place] [address]   Reason for call:   Needing an emergency supply of valtrex.

## 2014-07-09 ENCOUNTER — Encounter: Payer: Self-pay | Admitting: Family Medicine

## 2014-07-09 DIAGNOSIS — H029 Unspecified disorder of eyelid: Secondary | ICD-10-CM

## 2014-07-11 ENCOUNTER — Encounter: Payer: Self-pay | Admitting: Family Medicine

## 2014-07-12 ENCOUNTER — Other Ambulatory Visit: Payer: Self-pay

## 2014-07-12 DIAGNOSIS — M5136 Other intervertebral disc degeneration, lumbar region: Secondary | ICD-10-CM | POA: Diagnosis not present

## 2014-07-12 DIAGNOSIS — K148 Other diseases of tongue: Secondary | ICD-10-CM

## 2014-07-12 DIAGNOSIS — G894 Chronic pain syndrome: Secondary | ICD-10-CM | POA: Diagnosis not present

## 2014-07-12 DIAGNOSIS — Z79891 Long term (current) use of opiate analgesic: Secondary | ICD-10-CM | POA: Diagnosis not present

## 2014-07-12 DIAGNOSIS — K1370 Unspecified lesions of oral mucosa: Secondary | ICD-10-CM

## 2014-07-12 NOTE — Telephone Encounter (Signed)
Refer to ent 

## 2014-07-13 ENCOUNTER — Encounter: Payer: Self-pay | Admitting: Family Medicine

## 2014-07-13 DIAGNOSIS — K1370 Unspecified lesions of oral mucosa: Secondary | ICD-10-CM

## 2014-07-13 DIAGNOSIS — G894 Chronic pain syndrome: Secondary | ICD-10-CM

## 2014-07-13 NOTE — Telephone Encounter (Signed)
Ok to send valtrex and tramadol if not done

## 2014-07-17 ENCOUNTER — Other Ambulatory Visit: Payer: Self-pay | Admitting: Family Medicine

## 2014-07-17 ENCOUNTER — Telehealth: Payer: Self-pay | Admitting: Family Medicine

## 2014-07-17 DIAGNOSIS — G4700 Insomnia, unspecified: Secondary | ICD-10-CM

## 2014-07-17 DIAGNOSIS — M62838 Other muscle spasm: Secondary | ICD-10-CM

## 2014-07-17 NOTE — Telephone Encounter (Signed)
Pt called to check on status of ENT referral. Please call to notify her.

## 2014-07-18 ENCOUNTER — Encounter: Payer: Self-pay | Admitting: Family Medicine

## 2014-07-18 MED ORDER — VALACYCLOVIR HCL 1 G PO TABS: 1000.0000 mg | ORAL_TABLET | Freq: Two times a day (BID) | ORAL | Status: DC

## 2014-07-18 MED ORDER — TRAMADOL HCL 50 MG PO TABS: 50.0000 mg | ORAL_TABLET | Freq: Four times a day (QID) | ORAL | Status: DC | PRN

## 2014-07-18 NOTE — Addendum Note (Signed)
Addended by: Ewing Schlein on: 07/18/2014 09:31 AM   Modules accepted: Orders

## 2014-07-18 NOTE — Telephone Encounter (Signed)
Marji schedule with [facility] ENT/pt aware

## 2014-07-19 MED ORDER — CYCLOBENZAPRINE HCL 10 MG PO TABS: 10.0000 mg | ORAL_TABLET | Freq: Two times a day (BID) | ORAL | Status: DC | PRN

## 2014-07-19 MED ORDER — CLONAZEPAM 1 MG PO TABS: 1.0000 mg | ORAL_TABLET | Freq: Every day | ORAL | Status: DC

## 2014-07-19 MED ORDER — ZOLPIDEM TARTRATE 5 MG PO TABS: 5.0000 mg | ORAL_TABLET | Freq: Every evening | ORAL | Status: DC | PRN

## 2014-07-19 NOTE — Telephone Encounter (Signed)
Not sure why no one called her --- is that the only one that could see her? And the soonest appointment.  Please call pt and explain to her if that is the case.

## 2014-07-20 DIAGNOSIS — M5136 Other intervertebral disc degeneration, lumbar region: Secondary | ICD-10-CM | POA: Diagnosis not present

## 2014-07-21 ENCOUNTER — Other Ambulatory Visit: Payer: Self-pay | Admitting: Family Medicine

## 2014-07-21 ENCOUNTER — Encounter: Payer: Self-pay | Admitting: Family Medicine

## 2014-07-21 DIAGNOSIS — M62838 Other muscle spasm: Secondary | ICD-10-CM

## 2014-07-21 DIAGNOSIS — G4700 Insomnia, unspecified: Secondary | ICD-10-CM

## 2014-07-23 ENCOUNTER — Encounter: Payer: Self-pay | Admitting: Family Medicine

## 2014-07-23 ENCOUNTER — Other Ambulatory Visit: Payer: Self-pay

## 2014-07-23 DIAGNOSIS — M8580 Other specified disorders of bone density and structure, unspecified site: Secondary | ICD-10-CM

## 2014-07-23 MED ORDER — FLUOXETINE HCL 20 MG PO CAPS: 20.0000 mg | ORAL_CAPSULE | Freq: Every day | ORAL | Status: DC

## 2014-07-23 NOTE — Telephone Encounter (Signed)
These medications were sent on 07/19/14.    KP

## 2014-07-23 NOTE — Telephone Encounter (Signed)
Spoke with patient and made her aware her Klonopin on 04/07/14 was #90 with 2 refill on it, I called [employer] and was made aware that they do not do auto refills, the patient will need to call for her refills and I made her aware. Rx faxed to [facility] for Ambien  the other medications sent to [facility] per patient request..      KP

## 2014-07-26 DIAGNOSIS — Z79891 Long term (current) use of opiate analgesic: Secondary | ICD-10-CM | POA: Diagnosis not present

## 2014-07-26 DIAGNOSIS — G894 Chronic pain syndrome: Secondary | ICD-10-CM | POA: Diagnosis not present

## 2014-07-26 DIAGNOSIS — M4310 Spondylolisthesis, site unspecified: Secondary | ICD-10-CM | POA: Diagnosis not present

## 2014-07-26 DIAGNOSIS — M5136 Other intervertebral disc degeneration, lumbar region: Secondary | ICD-10-CM | POA: Diagnosis not present

## 2014-07-27 ENCOUNTER — Encounter: Payer: Self-pay | Admitting: Family Medicine

## 2014-08-11 ENCOUNTER — Encounter: Payer: Self-pay | Admitting: Family Medicine

## 2014-08-13 ENCOUNTER — Encounter: Payer: Self-pay | Admitting: Family Medicine

## 2014-08-13 DIAGNOSIS — T782XXA Anaphylactic shock, unspecified, initial encounter: Secondary | ICD-10-CM

## 2014-08-13 MED ORDER — EPINEPHRINE 0.3 MG/0.3ML IJ SOAJ: INTRAMUSCULAR | Status: DC

## 2014-08-13 MED ORDER — VALACYCLOVIR HCL 1 G PO TABS: 1000.0000 mg | ORAL_TABLET | Freq: Two times a day (BID) | ORAL | Status: DC

## 2014-08-13 NOTE — Addendum Note (Signed)
Addended by: Ewing Schlein on: 08/13/2014 01:32 PM   Modules accepted: Orders, Medications

## 2014-08-22 ENCOUNTER — Telehealth: Payer: Self-pay

## 2014-08-22 NOTE — Telephone Encounter (Signed)
I made the patient and advised we have notes from [facility] and he suggested having Dr.Lowne take over her pain med's. I made the patient aware that Dr.Lowne declined and she verbalized understanding, per patient she will scheduled a follow up with Dr.Lowne to discuss multiple concerns.     KP

## 2014-08-28 DIAGNOSIS — M47817 Spondylosis without myelopathy or radiculopathy, lumbosacral region: Secondary | ICD-10-CM | POA: Diagnosis not present

## 2014-08-29 ENCOUNTER — Encounter: Payer: Self-pay | Admitting: Family Medicine

## 2014-08-29 ENCOUNTER — Other Ambulatory Visit: Payer: Self-pay | Admitting: Family Medicine

## 2014-08-29 DIAGNOSIS — G4700 Insomnia, unspecified: Secondary | ICD-10-CM

## 2014-08-29 NOTE — Telephone Encounter (Signed)
I think she is just asking for tramadol---- can't really tell.   Ok to refill x1  1 refill

## 2014-08-30 MED ORDER — CLONAZEPAM 1 MG PO TABS: 1.0000 mg | ORAL_TABLET | Freq: Every day | ORAL | Status: DC

## 2014-08-30 NOTE — Telephone Encounter (Signed)
Last seen 06/18/14 and filled 07/19/14 #30 UDS 02/02/13 Low Risk   Please advise    KP

## 2014-09-04 ENCOUNTER — Ambulatory Visit: Payer: Commercial Managed Care - HMO | Admitting: Family Medicine

## 2014-09-04 ENCOUNTER — Encounter: Payer: Self-pay | Admitting: Family Medicine

## 2014-09-04 ENCOUNTER — Ambulatory Visit (INDEPENDENT_AMBULATORY_CARE_PROVIDER_SITE_OTHER): Payer: Commercial Managed Care - HMO | Admitting: Family Medicine

## 2014-09-04 VITALS — BP 132/82 | HR 84 | Temp 98.6°F | Wt 147.6 lb

## 2014-09-04 DIAGNOSIS — R1011 Right upper quadrant pain: Secondary | ICD-10-CM | POA: Diagnosis not present

## 2014-09-04 DIAGNOSIS — K439 Ventral hernia without obstruction or gangrene: Secondary | ICD-10-CM | POA: Diagnosis not present

## 2014-09-04 DIAGNOSIS — D1739 Benign lipomatous neoplasm of skin and subcutaneous tissue of other sites: Secondary | ICD-10-CM | POA: Diagnosis not present

## 2014-09-04 DIAGNOSIS — D171 Benign lipomatous neoplasm of skin and subcutaneous tissue of trunk: Secondary | ICD-10-CM

## 2014-09-04 MED ORDER — NONFORMULARY OR COMPOUNDED ITEM: Status: DC

## 2014-09-04 NOTE — Progress Notes (Signed)
Patient ID: [name], female    DOB: 07-20-1950  Age: 64 y.o. MRN: [id number]    Subjective:  Subjective HPI [name] presents c/o R hip lipoma.  She is seeing Dr Nelva Bush for back pain. She is also under a lot of stress with work and home.  She has asked her husband to leave but he will not.  There are a lot of financial issues at home.        Review of Systems  Constitutional: Negative for diaphoresis, appetite change, fatigue and unexpected weight change.  Eyes: Negative for pain, redness and visual disturbance.  Respiratory: Negative for cough, chest tightness, shortness of breath and wheezing.   Cardiovascular: Negative for chest pain, palpitations and leg swelling.  Endocrine: Negative for cold intolerance, heat intolerance, polydipsia, polyphagia and polyuria.  Genitourinary: Negative for dysuria, frequency and difficulty urinating.  Musculoskeletal: Positive for myalgias and back pain.  Neurological: Negative for dizziness, light-headedness, numbness and headaches.    History Past Medical History  Diagnosis Date  . ADD (attention deficit disorder)   . Depression   . GERD (gastroesophageal reflux disease)   . Hyperlipemia     "borderline"  . IBS (irritable bowel syndrome)     chronic constipation  . Fibromyalgia   . Osteoporosis   . Bronchitis     hx of  . Hepatitis     B  . Cataract of left eye     since birth  . Osteoarthritis     She has past surgical history that includes Cholecystectomy; Cesarean section; Tonsillectomy; Total knee arthroplasty (Right, 2009); Toe amputation (2007); Ventral hernia repair (2007); Carpal tunnel release (Bilateral); Bladder suspension (2001); Abdominal hysterectomy (2001); Upper gi endoscopy; Nose surgery (Left, 2007); Breast surgery; and Total knee arthroplasty (Left, 12/26/2012).   Her family history includes Arthritis in her mother; Cancer in her maternal grandfather and maternal uncle; Heart disease in her mother;  Hypertension in her maternal grandmother and paternal grandmother; Lung cancer in her paternal grandfather; Prostate cancer in her paternal grandfather.She reports that she quit smoking about 26 years ago. Her smoking use included Cigarettes. She has a 5 pack-year smoking history. She has never used smokeless tobacco. She reports that she drinks alcohol. She reports that she does not use illicit drugs.  Current Outpatient Prescriptions on File Prior to Visit  Medication Sig Dispense Refill  . alendronate (FOSAMAX) 70 MG tablet TAKE 1 TABLET EVERY WEEK. TAKE WITH FULL GLASS OF WATER ON AN EMPTY STOMACH 12 tablet 3  . CALCIUM-MAGNESIUM-ZINC PO Take 2 capsules by mouth at bedtime.    . clonazePAM (KLONOPIN) 1 MG tablet Take 1 tablet (1 mg total) by mouth at bedtime. 30 tablet 0  . conjugated estrogens (PREMARIN) vaginal cream 1 g pv qd for 2 weeks then decrease to every other day.  Wean down to 0.5 g  2-3 days a week if possible 42.5 g 3  . cyclobenzaprine (FLEXERIL) 10 MG tablet Take 1 tablet (10 mg total) by mouth 2 (two) times daily as needed for muscle spasms. 180 tablet 0  . docusate sodium 100 MG CAPS Take 100 mg by mouth 2 (two) times daily. 10 capsule 0  . EPINEPHrine (EPIPEN 2-PAK) 0.3 mg/0.3 mL IJ SOAJ injection As directed 2 Device 0  . fenofibrate 160 MG tablet Take 1 tablet (160 mg total) by mouth daily. 90 tablet 1  . FLUoxetine (PROZAC) 20 MG capsule Take 1 capsule (20 mg total) by mouth daily. 90 capsule 3  .  fluticasone (FLONASE) 50 MCG/ACT nasal spray Place 2 sprays into both nostrils daily. 48 g 3  . gabapentin (NEURONTIN) 300 MG capsule Take 1 capsule (300 mg total) by mouth 3 (three) times daily. 270 capsule 3  . hydrochlorothiazide (HYDRODIURIL) 25 MG tablet Take 1 tablet (25 mg total) by mouth daily. 90 tablet 3  . hyoscyamine (LEVSIN/SL) 0.125 MG SL tablet Place 1 tablet (0.125 mg total) under the tongue every 4 (four) hours as needed. 90 tablet 1  . meloxicam (MOBIC) 15 MG  tablet Take 1 tablet (15 mg total) by mouth daily. 90 tablet 3  . methylphenidate (RITALIN) 10 MG tablet Take 1 tablet (10 mg total) by mouth 3 (three) times daily with meals. 270 tablet 0  . Multiple Vitamin (MULTIVITAMIN WITH MINERALS) TABS tablet Take 1 tablet by mouth daily.    . niacin (GNP NIACIN) 250 MG tablet Take 500 mg by mouth daily with breakfast.     . Omega-3 Fatty Acids (FISH OIL) 1200 MG CAPS Take 2,400 capsules by mouth daily.    Marland Kitchen omeprazole (PRILOSEC) 40 MG capsule Take 1 capsule (40 mg total) by mouth daily. 90 capsule 3  . OVER THE COUNTER MEDICATION Take 2 tablets by mouth daily. TUMERIC    . oxyCODONE (OXY IR/ROXICODONE) 5 MG immediate release tablet Take 1 tablet by mouth as needed.    . polyethylene glycol (MIRALAX / GLYCOLAX) packet Take 17 g by mouth 2 (two) times daily. 14 each 0  . traMADol (ULTRAM) 50 MG tablet Take 1 tablet (50 mg total) by mouth every 6 (six) hours as needed. 120 tablet 1  . valACYclovir (VALTREX) 1000 MG tablet Take 1 tablet (1,000 mg total) by mouth 2 (two) times daily. 60 tablet 0  . zolpidem (AMBIEN) 5 MG tablet Take 1 tablet (5 mg total) by mouth at bedtime as needed. 90 tablet 0   No current facility-administered medications on file prior to visit.     Objective:  Objective Physical Exam  Constitutional: She is oriented to person, place, and time. She appears well-developed and well-nourished.  HENT:  Head: Normocephalic and atraumatic.  Eyes: Conjunctivae and EOM are normal.  Neck: Normal range of motion. Neck supple. No JVD present. Carotid bruit is not present. No thyromegaly present.  Cardiovascular: Normal rate, regular rhythm and normal heart sounds.   No murmur heard. Pulmonary/Chest: Effort normal and breath sounds normal. No respiratory distress. She has no wheezes. She has no rales. She exhibits no tenderness.  Abdominal: Soft. She exhibits no distension and no mass. There is tenderness. There is no rebound and no guarding.      Musculoskeletal: She exhibits no edema.  Neurological: She is alert and oriented to person, place, and time.  Skin:     Psychiatric: She has a normal mood and affect. Her behavior is normal.  Nursing note and vitals reviewed.  BP 132/82 mmHg  Pulse 84  Temp(Src) 98.6 F (37 C) (Oral)  Wt 147 lb 9.6 oz (66.951 kg)  SpO2 96% Wt Readings from Last 3 Encounters:  09/04/14 147 lb 9.6 oz (66.951 kg)  06/18/14 150 lb (68.04 kg)  04/10/14 144 lb (65.318 kg)     Lab Results  Component Value Date   WBC 9.3 04/10/2014   HGB 14.4 04/10/2014   HCT 42.9 04/10/2014   PLT 396.0 04/10/2014   GLUCOSE 90 04/10/2014   CHOL 238* 04/10/2014   TRIG 234.0* 04/10/2014   HDL 45.60 04/10/2014   LDLDIRECT 141.0 04/10/2014  [name] 127 05/24/2012   ALT 27 04/10/2014   AST 28 04/10/2014   NA 137 04/10/2014   K 3.7 04/10/2014   CL 102 04/10/2014   CREATININE 0.88 04/10/2014   BUN 10 04/10/2014   CO2 29 04/10/2014   TSH 1.24 10/03/2013   INR 0.88 12/19/2012   HGBA1C 5.8 04/10/2014    Dg Bone Density  10/10/2013   CLINICAL DATA:  64 year old postmenopausal white female with history of bilateral knee replacements, hysterectomy and oophorectomy at age 66, and short term corticosteroid use. The patient currently takes calcium and vitamin-D supplement a shin and has been taking Fosamax for the past 6 months. Follow-up exam.  EXAM: DUAL X-RAY ABSORPTIOMETRY (DXA) FOR BONE MINERAL DENSITY  FINDINGS: AP LUMBAR SPINE L4 excluded  Bone Mineral Density (BMD):  0.829 g/cm2  Young Adult T-Score:  -1.7  Z-Score:  -0.1  LEFT FEMUR NECK  Bone Mineral Density (BMD):  0.621 g/cm2  Young Adult T-Score: -2.1  Z-Score:  -0.6  ASSESSMENT: Patient's diagnostic category is LOW BONE MASS by WHO Criteria.  FRACTURE RISK: Unknown  FRAX: World Health Organization FRAX assessment of absolute fracture risk is not calculated for this patient because the patient has a history of bone building therapy.  COMPARISON: Compared to  the most recent prior exam dated 09/08/2011 there has been no significant interval change in bone mineral density of the lumbar spine and no significant interval change in bone mineral density of the left hip.  Since the baseline exam dated 04/30/2009 there has been a significant 5.5% increase in bone mineral density of the lumbar spine and a significant 5.9% increase in bone mineral density of the left hip.  Effective therapies are available in the form of bisphosphonates, selective estrogen receptor modulators, biologic agents, and hormone replacement therapy (for women). All patients should ensure an adequate intake of dietary calcium (1200 mg daily) and vitamin D (800 IU daily) unless contraindicated.  All treatment decisions require clinical judgment and consideration of individual patient factors, including patient preferences, co-morbidities, previous drug use, risk factors not captured in the FRAX model (e.g., frailty, falls, vitamin D deficiency, increased bone turnover, interval significant decline in bone density) and possible under- or over-estimation of fracture risk by FRAX.  The National Osteoporosis Foundation recommends that FDA-approved medical therapies be considered in postmenopausal women and men age 24 or older with a:  1. Hip or vertebral (clinical or morphometric) fracture.  2. T-score of -2.5 or lower at the spine or hip.  3. Ten-year fracture probability by FRAX of 3% or greater for hip fracture or 20% or greater for major osteoporotic fracture.  People with diagnosed cases of osteoporosis or at high risk for fracture should have regular bone mineral density tests. For patients eligible for Medicare, routine testing is allowed once every 2 years. The testing frequency can be increased to one year for patients who have rapidly progressing disease, those who are receiving or discontinuing medical therapy to restore bone mass, or have additional risk factors.  World Pharmacologist [facility])  Criteria:  Normal: T-scores from +1.0 to -1.0  Low Bone Mass (Osteopenia): T-scores between -1.0 and -2.5  Osteoporosis: T-scores -2.5 and below  Comparison to Reference Population:  T-score is the key measure used in the diagnosis of osteoporosis and relative risk determination for fracture. It provides a value for bone mass relative to the mean bone mass of a young adult reference population expressed in terms of standard deviation (SD).  Z-score is the age-matched  score showing the patient's values compared to a population matched for age, sex, and race. This is also expressed in terms of standard deviation. The patient may have values that compare favorably to the age-matched values and still be at increased risk for fracture.   Electronically Signed   By: Everlean Alstrom M.D.   On: 10/11/2013 13:41     Assessment & Plan:  Plan I have discontinued Ms. Dower's ARIPiprazole, levofloxacin, and metroNIDAZOLE. I am also having her start on NONFORMULARY OR COMPOUNDED ITEM. Additionally, I am having her maintain her niacin, multivitamin with minerals, Fish Oil, CALCIUM-MAGNESIUM-ZINC PO, OVER THE COUNTER MEDICATION, DSS, polyethylene glycol, oxyCODONE, alendronate, conjugated estrogens, fluticasone, gabapentin, hydrochlorothiazide, hyoscyamine, meloxicam, omeprazole, fenofibrate, methylphenidate, traMADol, cyclobenzaprine, zolpidem, FLUoxetine, EPINEPHrine, valACYclovir, and clonazePAM.  Meds ordered this encounter  Medications  . NONFORMULARY OR COMPOUNDED ITEM    Sig: [facility] labs Lipase, Amylase, CBC w/diff, CMP, Lipid, Urinalysis for abdominal pain    Dispense:  1 each    Refill:  0    Problem List Items Addressed This Visit    None    Visit Diagnoses    Lipoma of buttock    -  Primary    Relevant Orders    Ambulatory referral to General Surgery    Hernia of abdominal wall        Right upper quadrant pain        Relevant Orders    US Abdomen Complete       Follow-up: Return in about  6 months (around 03/07/2015), or if symptoms worsen or fail to improve, for hypertension, hyperlipidemia.  Garnet Koyanagi, DO

## 2014-09-04 NOTE — Patient Instructions (Signed)
Lipoma  A lipoma is a noncancerous (benign) tumor composed of fat cells. They are usually found under the skin (subcutaneous). A lipoma may occur in any tissue of the body that contains fat. Common areas for lipomas to appear include the back, shoulders, buttocks, and thighs. Lipomas are a very common soft tissue growth. They are soft and grow slowly. Most problems caused by a lipoma depend on where it is growing.  DIAGNOSIS   A lipoma can be diagnosed with a physical exam. These tumors rarely become cancerous, but radiographic studies can help determine this for certain. Studies used may include:  · Computerized X-ray scans (CT or CAT scan).  · Computerized magnetic scans (MRI).  TREATMENT   Small lipomas that are not causing problems may be watched. If a lipoma continues to enlarge or causes problems, removal is often the best treatment. Lipomas can also be removed to improve appearance. Surgery is done to remove the fatty cells and the surrounding capsule. Most often, this is done with medicine that numbs the area (local anesthetic). The removed tissue is examined under a microscope to make sure it is not cancerous. Keep all follow-up appointments with your caregiver.  SEEK MEDICAL CARE IF:   · The lipoma becomes larger or hard.  · The lipoma becomes painful, red, or increasingly swollen. These could be signs of infection or a more serious condition.  Document Released: 12/19/2001 Document Revised: 03/23/2011 Document Reviewed: 05/31/2009  ExitCare® Patient Information ©2015 [employer]. This information is not intended to replace advice given to you by your health care provider. Make sure you discuss any questions you have with your health care provider.

## 2014-09-04 NOTE — Progress Notes (Signed)
Pre visit review using our clinic review tool, if applicable. No additional management support is needed unless otherwise documented below in the visit note. 

## 2014-09-06 ENCOUNTER — Ambulatory Visit (HOSPITAL_COMMUNITY)
Admission: RE | Admit: 2014-09-06 | Discharge: 2014-09-06 | Disposition: A | Discharge status: Home / Self Care | Payer: Commercial Managed Care - HMO | Source: Ambulatory Visit | Attending: Family Medicine | Admitting: Family Medicine

## 2014-09-06 DIAGNOSIS — Z9889 Other specified postprocedural states: Secondary | ICD-10-CM | POA: Diagnosis not present

## 2014-09-06 DIAGNOSIS — R1011 Right upper quadrant pain: Secondary | ICD-10-CM | POA: Insufficient documentation

## 2014-09-07 DIAGNOSIS — M5136 Other intervertebral disc degeneration, lumbar region: Secondary | ICD-10-CM | POA: Diagnosis not present

## 2014-09-10 DIAGNOSIS — M4310 Spondylolisthesis, site unspecified: Secondary | ICD-10-CM | POA: Diagnosis not present

## 2014-09-10 DIAGNOSIS — G894 Chronic pain syndrome: Secondary | ICD-10-CM | POA: Diagnosis not present

## 2014-09-10 DIAGNOSIS — M5136 Other intervertebral disc degeneration, lumbar region: Secondary | ICD-10-CM | POA: Diagnosis not present

## 2014-09-10 DIAGNOSIS — Z79891 Long term (current) use of opiate analgesic: Secondary | ICD-10-CM | POA: Diagnosis not present

## 2014-09-11 ENCOUNTER — Encounter: Payer: Self-pay | Admitting: Family Medicine

## 2014-09-11 DIAGNOSIS — Z0100 Encounter for examination of eyes and vision without abnormal findings: Secondary | ICD-10-CM

## 2014-09-12 ENCOUNTER — Other Ambulatory Visit: Payer: Self-pay | Admitting: Family Medicine

## 2014-09-12 ENCOUNTER — Telehealth: Payer: Self-pay | Admitting: Family Medicine

## 2014-09-12 DIAGNOSIS — M62838 Other muscle spasm: Secondary | ICD-10-CM

## 2014-09-12 DIAGNOSIS — H02409 Unspecified ptosis of unspecified eyelid: Secondary | ICD-10-CM

## 2014-09-12 DIAGNOSIS — G4700 Insomnia, unspecified: Secondary | ICD-10-CM

## 2014-09-13 DIAGNOSIS — H02831 Dermatochalasis of right upper eyelid: Secondary | ICD-10-CM | POA: Diagnosis not present

## 2014-09-13 MED ORDER — CYCLOBENZAPRINE HCL 10 MG PO TABS: 10.0000 mg | ORAL_TABLET | Freq: Two times a day (BID) | ORAL | Status: DC | PRN

## 2014-09-13 MED ORDER — ZOLPIDEM TARTRATE 5 MG PO TABS: 5.0000 mg | ORAL_TABLET | Freq: Every evening | ORAL | Status: DC | PRN

## 2014-09-13 NOTE — Addendum Note (Signed)
Addended by: Ewing Schlein on: 09/13/2014 02:09 PM   Modules accepted: Orders

## 2014-09-13 NOTE — Telephone Encounter (Signed)
Please advise      KP 

## 2014-09-18 ENCOUNTER — Other Ambulatory Visit: Payer: Self-pay | Admitting: Surgery

## 2014-09-18 DIAGNOSIS — M6208 Separation of muscle (nontraumatic), other site: Secondary | ICD-10-CM | POA: Diagnosis not present

## 2014-09-18 DIAGNOSIS — D171 Benign lipomatous neoplasm of skin and subcutaneous tissue of trunk: Secondary | ICD-10-CM | POA: Diagnosis not present

## 2014-09-20 DIAGNOSIS — M4310 Spondylolisthesis, site unspecified: Secondary | ICD-10-CM | POA: Diagnosis not present

## 2014-09-20 DIAGNOSIS — M4806 Spinal stenosis, lumbar region: Secondary | ICD-10-CM | POA: Diagnosis not present

## 2014-09-20 DIAGNOSIS — S134XXA Sprain of ligaments of cervical spine, initial encounter: Secondary | ICD-10-CM | POA: Diagnosis not present

## 2014-09-20 DIAGNOSIS — M5033 Other cervical disc degeneration, cervicothoracic region: Secondary | ICD-10-CM | POA: Diagnosis not present

## 2014-10-04 ENCOUNTER — Encounter (HOSPITAL_COMMUNITY): Payer: Self-pay

## 2014-10-04 ENCOUNTER — Emergency Department (HOSPITAL_COMMUNITY): Payer: Medicare HMO

## 2014-10-04 ENCOUNTER — Emergency Department (HOSPITAL_COMMUNITY)
Admission: EM | Admit: 2014-10-04 | Discharge: 2014-10-04 | Disposition: A | Discharge status: Home / Self Care | Payer: Medicare HMO | Attending: Emergency Medicine | Admitting: Emergency Medicine

## 2014-10-04 DIAGNOSIS — F329 Major depressive disorder, single episode, unspecified: Secondary | ICD-10-CM | POA: Insufficient documentation

## 2014-10-04 DIAGNOSIS — Y9389 Activity, other specified: Secondary | ICD-10-CM | POA: Insufficient documentation

## 2014-10-04 DIAGNOSIS — Z7951 Long term (current) use of inhaled steroids: Secondary | ICD-10-CM | POA: Insufficient documentation

## 2014-10-04 DIAGNOSIS — Z87891 Personal history of nicotine dependence: Secondary | ICD-10-CM | POA: Diagnosis not present

## 2014-10-04 DIAGNOSIS — M1990 Unspecified osteoarthritis, unspecified site: Secondary | ICD-10-CM | POA: Diagnosis not present

## 2014-10-04 DIAGNOSIS — Y92410 Unspecified street and highway as the place of occurrence of the external cause: Secondary | ICD-10-CM | POA: Diagnosis not present

## 2014-10-04 DIAGNOSIS — Z791 Long term (current) use of non-steroidal anti-inflammatories (NSAID): Secondary | ICD-10-CM | POA: Diagnosis not present

## 2014-10-04 DIAGNOSIS — S2190XA Unspecified open wound of unspecified part of thorax, initial encounter: Secondary | ICD-10-CM | POA: Diagnosis not present

## 2014-10-04 DIAGNOSIS — Z8669 Personal history of other diseases of the nervous system and sense organs: Secondary | ICD-10-CM | POA: Insufficient documentation

## 2014-10-04 DIAGNOSIS — Y998 Other external cause status: Secondary | ICD-10-CM | POA: Diagnosis not present

## 2014-10-04 DIAGNOSIS — S20311A Abrasion of right front wall of thorax, initial encounter: Secondary | ICD-10-CM | POA: Diagnosis not present

## 2014-10-04 DIAGNOSIS — K219 Gastro-esophageal reflux disease without esophagitis: Secondary | ICD-10-CM | POA: Insufficient documentation

## 2014-10-04 DIAGNOSIS — Z8709 Personal history of other diseases of the respiratory system: Secondary | ICD-10-CM | POA: Insufficient documentation

## 2014-10-04 DIAGNOSIS — F909 Attention-deficit hyperactivity disorder, unspecified type: Secondary | ICD-10-CM | POA: Insufficient documentation

## 2014-10-04 DIAGNOSIS — Z79899 Other long term (current) drug therapy: Secondary | ICD-10-CM | POA: Insufficient documentation

## 2014-10-04 DIAGNOSIS — M810 Age-related osteoporosis without current pathological fracture: Secondary | ICD-10-CM | POA: Diagnosis not present

## 2014-10-04 DIAGNOSIS — E785 Hyperlipidemia, unspecified: Secondary | ICD-10-CM | POA: Insufficient documentation

## 2014-10-04 DIAGNOSIS — R079 Chest pain, unspecified: Secondary | ICD-10-CM | POA: Diagnosis not present

## 2014-10-04 DIAGNOSIS — S29001A Unspecified injury of muscle and tendon of front wall of thorax, initial encounter: Secondary | ICD-10-CM | POA: Diagnosis present

## 2014-10-04 MED ORDER — HYDROCODONE-ACETAMINOPHEN 5-325 MG PO TABS
1.0000 | ORAL_TABLET | Freq: Once | ORAL | Status: AC
  Administered 2014-10-04: 1 via ORAL
  Filled 2014-10-04: qty 1
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
## 2014-10-04 NOTE — Discharge Instructions (Signed)
You may take your home prescriptions for flexeril and vicodin as prescribed for pain relief. You may also take 600mg  Ibuprofen and use ice for pain relief.  Please follow up with your primary care provider next week. Please return to the Emergency Department if symptoms worsen.
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
## 2014-10-05 ENCOUNTER — Encounter: Payer: Self-pay | Admitting: Family Medicine

## 2014-10-05 NOTE — Telephone Encounter (Signed)
What is she asking for?

## 2014-10-09 ENCOUNTER — Telehealth: Payer: Self-pay | Admitting: Family Medicine

## 2014-10-09 ENCOUNTER — Ambulatory Visit (INDEPENDENT_AMBULATORY_CARE_PROVIDER_SITE_OTHER): Payer: Commercial Managed Care - HMO | Admitting: Family Medicine

## 2014-10-09 ENCOUNTER — Ambulatory Visit: Payer: Commercial Managed Care - HMO | Admitting: Family Medicine

## 2014-10-09 ENCOUNTER — Encounter: Payer: Self-pay | Admitting: Family Medicine

## 2014-10-09 DIAGNOSIS — S40022A Contusion of left upper arm, initial encounter: Secondary | ICD-10-CM

## 2014-10-09 DIAGNOSIS — S2001XA Contusion of right breast, initial encounter: Secondary | ICD-10-CM | POA: Diagnosis not present

## 2014-10-09 DIAGNOSIS — S40021A Contusion of right upper arm, initial encounter: Secondary | ICD-10-CM | POA: Diagnosis not present

## 2014-10-09 DIAGNOSIS — S5011XA Contusion of right forearm, initial encounter: Secondary | ICD-10-CM

## 2014-10-09 DIAGNOSIS — K219 Gastro-esophageal reflux disease without esophagitis: Secondary | ICD-10-CM

## 2014-10-09 DIAGNOSIS — E785 Hyperlipidemia, unspecified: Secondary | ICD-10-CM

## 2014-10-09 DIAGNOSIS — Z23 Encounter for immunization: Secondary | ICD-10-CM

## 2014-10-09 DIAGNOSIS — K1379 Other lesions of oral mucosa: Secondary | ICD-10-CM

## 2014-10-09 LAB — POCT URINALYSIS DIPSTICK
Bilirubin, UA: NEGATIVE
Blood, UA: NEGATIVE
Glucose, UA: NEGATIVE
KETONES UA: NEGATIVE
LEUKOCYTES UA: NEGATIVE
Nitrite, UA: NEGATIVE
PH UA: 6
PROTEIN UA: NEGATIVE
SPEC GRAV UA: 1.02
UROBILINOGEN UA: 0.2

## 2014-10-09 MED ORDER — OMEPRAZOLE 40 MG PO CPDR: 40.0000 mg | DELAYED_RELEASE_CAPSULE | Freq: Every day | ORAL | Status: DC

## 2014-10-09 NOTE — Assessment & Plan Note (Signed)
Bruising resolving  Stiffness in neck improving as well

## 2014-10-09 NOTE — Patient Instructions (Signed)
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
## 2014-10-09 NOTE — Progress Notes (Signed)
Pre visit review using our clinic review tool, if applicable. No additional management support is needed unless otherwise documented below in the visit note. 

## 2014-10-11 NOTE — Telephone Encounter (Signed)
yes

## 2014-10-11 NOTE — Telephone Encounter (Signed)
Pt was no show 10/09/14 9:00am, pt called and was running late stating gps took her to the old location, open appt for 9:15am, charge for no show?

## 2014-10-17 ENCOUNTER — Encounter: Payer: Self-pay | Admitting: Family Medicine

## 2014-10-18 ENCOUNTER — Other Ambulatory Visit: Payer: Self-pay

## 2014-10-18 DIAGNOSIS — R7989 Other specified abnormal findings of blood chemistry: Secondary | ICD-10-CM

## 2014-10-18 DIAGNOSIS — R945 Abnormal results of liver function studies: Principal | ICD-10-CM

## 2014-10-18 NOTE — Telephone Encounter (Signed)
Shanon Brow came ou

## 2014-10-18 NOTE — Telephone Encounter (Signed)
Shanon Brow did you do the Allergy test?

## 2014-10-18 NOTE — Telephone Encounter (Signed)
[name] came and got me to ask about allergy test so I put order in but don't see results

## 2014-10-18 NOTE — Telephone Encounter (Signed)
Labs were [facility]--- I commented on labs and put them in your try Mon or Tues.    Not sure what is is asking about the breast --- but it does not rupture.

## 2014-10-18 NOTE — Telephone Encounter (Signed)
[name] got me and specifically asked about allergy test so I put order in---- it does not look like it was done.

## 2014-10-19 ENCOUNTER — Encounter: Payer: Self-pay | Admitting: Family Medicine

## 2014-10-19 DIAGNOSIS — K1379 Other lesions of oral mucosa: Secondary | ICD-10-CM | POA: Diagnosis not present

## 2014-10-22 LAB — ~~LOC~~ ALLERGY PANEL
Allergen, Mulberry, t76: <0.1 kU/L
Alternaria Alternata: <0.1 kU/L
Aspergillus fumigatus, m3: <0.1 kU/L
Bermuda Grass: <0.1 kU/L
Cladosporium Herbarum: <0.1 kU/L
Cockroach: <0.1 kU/L
Common Ragweed: <0.1 kU/L
Mugwort: <0.1 kU/L
Nettle: <0.1 kU/L
Oak: <0.1 kU/L
Pecan/Hickory Tree IgE: <0.1 kU/L
Stemphylium Botryosum: <0.1 kU/L
Timothy Grass: <0.1 kU/L

## 2014-10-29 DIAGNOSIS — S134XXA Sprain of ligaments of cervical spine, initial encounter: Secondary | ICD-10-CM | POA: Diagnosis not present

## 2014-10-29 DIAGNOSIS — M5033 Other cervical disc degeneration, cervicothoracic region: Secondary | ICD-10-CM | POA: Diagnosis not present

## 2014-11-13 DIAGNOSIS — S134XXD Sprain of ligaments of cervical spine, subsequent encounter: Secondary | ICD-10-CM | POA: Diagnosis not present

## 2014-11-19 ENCOUNTER — Other Ambulatory Visit: Payer: Self-pay | Admitting: Family Medicine

## 2014-11-19 DIAGNOSIS — S134XXD Sprain of ligaments of cervical spine, subsequent encounter: Secondary | ICD-10-CM | POA: Diagnosis not present

## 2014-11-22 DIAGNOSIS — S134XXD Sprain of ligaments of cervical spine, subsequent encounter: Secondary | ICD-10-CM | POA: Diagnosis not present

## 2014-11-26 DIAGNOSIS — S134XXD Sprain of ligaments of cervical spine, subsequent encounter: Secondary | ICD-10-CM | POA: Diagnosis not present

## 2014-11-29 DIAGNOSIS — S134XXD Sprain of ligaments of cervical spine, subsequent encounter: Secondary | ICD-10-CM | POA: Diagnosis not present

## 2014-12-05 DIAGNOSIS — S134XXD Sprain of ligaments of cervical spine, subsequent encounter: Secondary | ICD-10-CM | POA: Diagnosis not present

## 2014-12-10 ENCOUNTER — Telehealth: Payer: Self-pay | Admitting: Family Medicine

## 2014-12-10 DIAGNOSIS — S134XXD Sprain of ligaments of cervical spine, subsequent encounter: Secondary | ICD-10-CM | POA: Diagnosis not present

## 2014-12-11 NOTE — Telephone Encounter (Signed)
Dr Paz--pt is requesting this through mail order and wants #90. Is this ok?

## 2014-12-11 NOTE — Telephone Encounter (Signed)
Okay #30, no refills 

## 2014-12-11 NOTE — Telephone Encounter (Signed)
Pt is requesting refill on Clonazepam. Lowne Pt.  Last OV: 10/09/2014 Last Fill: 08/30/2014 #30 and 1RF UDS: 01/05/2012 Low risk  Please advise.

## 2014-12-12 NOTE — Telephone Encounter (Signed)
Refill called to [facility] as below. Mychart message sent to pt.

## 2014-12-12 NOTE — Telephone Encounter (Signed)
30 only -- PCP can send in 90 when she returns.

## 2014-12-14 DIAGNOSIS — S134XXD Sprain of ligaments of cervical spine, subsequent encounter: Secondary | ICD-10-CM | POA: Diagnosis not present

## 2014-12-17 ENCOUNTER — Ambulatory Visit (INDEPENDENT_AMBULATORY_CARE_PROVIDER_SITE_OTHER): Payer: Commercial Managed Care - HMO

## 2014-12-17 VITALS — BP 110/70 | HR 85 | Ht <= 58 in | Wt 153.4 lb

## 2014-12-17 DIAGNOSIS — Z0000 Encounter for general adult medical examination without abnormal findings: Secondary | ICD-10-CM

## 2014-12-17 NOTE — Patient Instructions (Addendum)
Eat heart healthy diet (full of fruits, vegetables, whole grains, lean protein, water--limit salt, fat, and sugar intake) and increase physical activity as tolerated.  Continue doing brain stimulating activities (puzzles, reading, adult coloring books, staying active) to keep memory sharp.   Follow up with Dr. Lowne as scheduled.    Health Maintenance, Female Adopting a healthy lifestyle and getting preventive care can go a long way to promote health and wellness. Talk with your health care provider about what schedule of regular examinations is right for you. This is a good chance for you to check in with your provider about disease prevention and staying healthy. In between checkups, there are plenty of things you can do on your own. Experts have done a lot of research about which lifestyle changes and preventive measures are most likely to keep you healthy. Ask your health care provider for more information. WEIGHT AND DIET  Eat a healthy diet  Be sure to include plenty of vegetables, fruits, low-fat dairy products, and lean protein.  Do not eat a lot of foods high in solid fats, added sugars, or salt.  Get regular exercise. This is one of the most important things you can do for your health.  Most adults should exercise for at least 150 minutes each week. The exercise should increase your heart rate and make you sweat (moderate-intensity exercise).  Most adults should also do strengthening exercises at least twice a week. This is in addition to the moderate-intensity exercise.  Maintain a healthy weight  Body mass index (BMI) is a measurement that can be used to identify possible weight problems. It estimates body fat based on height and weight. Your health care provider can help determine your BMI and help you achieve or maintain a healthy weight.  For females 20 years of age and older:   A BMI below 18.5 is considered underweight.  A BMI of 18.5 to 24.9 is normal.  A BMI of 25  to 29.9 is considered overweight.  A BMI of 30 and above is considered obese.  Watch levels of cholesterol and blood lipids  You should start having your blood tested for lipids and cholesterol at 64 years of age, then have this test every 5 years.  You may need to have your cholesterol levels checked more often if:  Your lipid or cholesterol levels are high.  You are older than 64 years of age.  You are at high risk for heart disease.  CANCER SCREENING   Lung Cancer  Lung cancer screening is recommended for adults 55-80 years old who are at high risk for lung cancer because of a history of smoking.  A yearly low-dose CT scan of the lungs is recommended for people who:  Currently smoke.  Have quit within the past 15 years.  Have at least a 30-pack-year history of smoking. A pack year is smoking an average of one pack of cigarettes a day for 1 year.  Yearly screening should continue until it has been 15 years since you quit.  Yearly screening should stop if you develop a health problem that would prevent you from having lung cancer treatment.  Breast Cancer  Practice breast self-awareness. This means understanding how your breasts normally appear and feel.  It also means doing regular breast self-exams. Let your health care provider know about any changes, no matter how small.  If you are in your 20s or 30s, you should have a clinical breast exam (CBE) by a health care provider   every 1-3 years as part of a regular health exam.  If you are 40 or older, have a CBE every year. Also consider having a breast X-ray (mammogram) every year.  If you have a family history of breast cancer, talk to your health care provider about genetic screening.  If you are at high risk for breast cancer, talk to your health care provider about having an MRI and a mammogram every year.  Breast cancer gene (BRCA) assessment is recommended for women who have family members with BRCA-related  cancers. BRCA-related cancers include:  Breast.  Ovarian.  Tubal.  Peritoneal cancers.  Results of the assessment will determine the need for genetic counseling and BRCA1 and BRCA2 testing. Cervical Cancer Your health care provider may recommend that you be screened regularly for cancer of the pelvic organs (ovaries, uterus, and vagina). This screening involves a pelvic examination, including checking for microscopic changes to the surface of your cervix (Pap test). You may be encouraged to have this screening done every 3 years, beginning at age 21.  For women ages 30-65, health care providers may recommend pelvic exams and Pap testing every 3 years, or they may recommend the Pap and pelvic exam, combined with testing for human papilloma virus (HPV), every 5 years. Some types of HPV increase your risk of cervical cancer. Testing for HPV may also be done on women of any age with unclear Pap test results.  Other health care providers may not recommend any screening for nonpregnant women who are considered low risk for pelvic cancer and who do not have symptoms. Ask your health care provider if a screening pelvic exam is right for you.  If you have had past treatment for cervical cancer or a condition that could lead to cancer, you need Pap tests and screening for cancer for at least 20 years after your treatment. If Pap tests have been discontinued, your risk factors (such as having a new sexual partner) need to be reassessed to determine if screening should resume. Some women have medical problems that increase the chance of getting cervical cancer. In these cases, your health care provider may recommend more frequent screening and Pap tests. Colorectal Cancer  This type of cancer can be detected and often prevented.  Routine colorectal cancer screening usually begins at 64 years of age and continues through 64 years of age.  Your health care provider may recommend screening at an earlier  age if you have risk factors for colon cancer.  Your health care provider may also recommend using home test kits to check for hidden blood in the stool.  A small camera at the end of a tube can be used to examine your colon directly (sigmoidoscopy or colonoscopy). This is done to check for the earliest forms of colorectal cancer.  Routine screening usually begins at age 50.  Direct examination of the colon should be repeated every 5-10 years through 64 years of age. However, you may need to be screened more often if early forms of precancerous polyps or small growths are found. Skin Cancer  Check your skin from head to toe regularly.  Tell your health care provider about any new moles or changes in moles, especially if there is a change in a mole's shape or color.  Also tell your health care provider if you have a mole that is larger than the size of a pencil eraser.  Always use sunscreen. Apply sunscreen liberally and repeatedly throughout the day.  Protect yourself by   wearing long sleeves, pants, a wide-brimmed hat, and sunglasses whenever you are outside. HEART DISEASE, DIABETES, AND HIGH BLOOD PRESSURE   High blood pressure causes heart disease and increases the risk of stroke. High blood pressure is more likely to develop in:  People who have blood pressure in the high end of the normal range (130-139/85-89 mm Hg).  People who are overweight or obese.  People who are African American.  If you are 54-64 years of age, have your blood pressure checked every 3-5 years. If you are 93 years of age or older, have your blood pressure checked every year. You should have your blood pressure measured twice--once when you are at a hospital or clinic, and once when you are not at a hospital or clinic. Record the average of the two measurements. To check your blood pressure when you are not at a hospital or clinic, you can use:  An automated blood pressure machine at a pharmacy.  A home  blood pressure monitor.  If you are between 76 years and 32 years old, ask your health care provider if you should take aspirin to prevent strokes.  Have regular diabetes screenings. This involves taking a blood sample to check your fasting blood sugar level.  If you are at a normal weight and have a low risk for diabetes, have this test once every three years after 64 years of age.  If you are overweight and have a high risk for diabetes, consider being tested at a younger age or more often. PREVENTING INFECTION  Hepatitis B  If you have a higher risk for hepatitis B, you should be screened for this virus. You are considered at high risk for hepatitis B if:  You were born in a country where hepatitis B is common. Ask your health care provider which countries are considered high risk.  Your parents were born in a high-risk country, and you have not been immunized against hepatitis B (hepatitis B vaccine).  You have HIV or AIDS.  You use needles to inject street drugs.  You live with someone who has hepatitis B.  You have had sex with someone who has hepatitis B.  You get hemodialysis treatment.  You take certain medicines for conditions, including cancer, organ transplantation, and autoimmune conditions. Hepatitis C  Blood testing is recommended for:  Everyone born from [birth year] through 1965.  Anyone with known risk factors for hepatitis C. Sexually transmitted infections (STIs)  You should be screened for sexually transmitted infections (STIs) including gonorrhea and chlamydia if:  You are sexually active and are younger than 64 years of age.  You are older than 64 years of age and your health care provider tells you that you are at risk for this type of infection.  Your sexual activity has changed since you were last screened and you are at an increased risk for chlamydia or gonorrhea. Ask your health care provider if you are at risk.  If you do not have HIV, but are at  risk, it may be recommended that you take a prescription medicine daily to prevent HIV infection. This is called pre-exposure prophylaxis (PrEP). You are considered at risk if:  You are sexually active and do not regularly use condoms or know the HIV status of your partner(s).  You take drugs by injection.  You are sexually active with a partner who has HIV. Talk with your health care provider about whether you are at high risk of being infected with HIV. If  you choose to begin PrEP, you should first be tested for HIV. You should then be tested every 3 months for as long as you are taking PrEP.  PREGNANCY   If you are premenopausal and you may become pregnant, ask your health care provider about preconception counseling.  If you may become pregnant, take 400 to 800 micrograms (mcg) of folic acid every day.  If you want to prevent pregnancy, talk to your health care provider about birth control (contraception). OSTEOPOROSIS AND MENOPAUSE   Osteoporosis is a disease in which the bones lose minerals and strength with aging. This can result in serious bone fractures. Your risk for osteoporosis can be identified using a bone density scan.  If you are 7 years of age or older, or if you are at risk for osteoporosis and fractures, ask your health care provider if you should be screened.  Ask your health care provider whether you should take a calcium or vitamin D supplement to lower your risk for osteoporosis.  Menopause may have certain physical symptoms and risks.  Hormone replacement therapy may reduce some of these symptoms and risks. Talk to your health care provider about whether hormone replacement therapy is right for you.  HOME CARE INSTRUCTIONS   Schedule regular health, dental, and eye exams.  Stay current with your immunizations.   Do not use any tobacco products including cigarettes, chewing tobacco, or electronic cigarettes.  If you are pregnant, do not drink alcohol.  If  you are breastfeeding, limit how much and how often you drink alcohol.  Limit alcohol intake to no more than 1 drink per day for nonpregnant women. One drink equals 12 ounces of beer, 5 ounces of wine, or 1 ounces of hard liquor.  Do not use street drugs.  Do not share needles.  Ask your health care provider for help if you need support or information about quitting drugs.  Tell your health care provider if you often feel depressed.  Tell your health care provider if you have ever been abused or do not feel safe at home.   This information is not intended to replace advice given to you by your health care provider. Make sure you discuss any questions you have with your health care provider.   Document Released: 07/14/2010 Document Revised: 01/19/2014 Document Reviewed: 11/30/2012 Elsevier Interactive Patient Education [employer].

## 2014-12-17 NOTE — Progress Notes (Signed)
Pre visit review using our clinic review tool, if applicable. No additional management support is needed unless otherwise documented below in the visit note. 
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
## 2014-12-18 DIAGNOSIS — S134XXD Sprain of ligaments of cervical spine, subsequent encounter: Secondary | ICD-10-CM | POA: Diagnosis not present

## 2014-12-18 DIAGNOSIS — M5033 Other cervical disc degeneration, cervicothoracic region: Secondary | ICD-10-CM | POA: Diagnosis not present

## 2014-12-20 DIAGNOSIS — H02831 Dermatochalasis of right upper eyelid: Secondary | ICD-10-CM | POA: Diagnosis not present

## 2014-12-20 DIAGNOSIS — H02834 Dermatochalasis of left upper eyelid: Secondary | ICD-10-CM | POA: Diagnosis not present

## 2014-12-24 NOTE — Progress Notes (Signed)
reviewed

## 2014-12-31 DIAGNOSIS — M542 Cervicalgia: Secondary | ICD-10-CM | POA: Diagnosis not present

## 2015-01-08 DIAGNOSIS — S134XXD Sprain of ligaments of cervical spine, subsequent encounter: Secondary | ICD-10-CM | POA: Diagnosis not present

## 2015-01-08 DIAGNOSIS — M5033 Other cervical disc degeneration, cervicothoracic region: Secondary | ICD-10-CM | POA: Diagnosis not present

## 2015-01-15 ENCOUNTER — Encounter: Payer: Self-pay | Admitting: Family Medicine

## 2015-01-15 DIAGNOSIS — J302 Other seasonal allergic rhinitis: Secondary | ICD-10-CM

## 2015-01-15 MED ORDER — ALBUTEROL SULFATE HFA 108 (90 BASE) MCG/ACT IN AERS: 2.0000 | INHALATION_SPRAY | Freq: Four times a day (QID) | RESPIRATORY_TRACT | Status: DC | PRN

## 2015-01-15 MED ORDER — CLONAZEPAM 1 MG PO TABS: 1.0000 mg | ORAL_TABLET | Freq: Every day | ORAL | Status: DC

## 2015-01-15 NOTE — Telephone Encounter (Signed)
Ok to refill both?? 

## 2015-01-15 NOTE — Telephone Encounter (Signed)
Rx requested for a 90 day supply of the Klonopin. Rx sent on 12/12/14 #30 by Lenna Sciara to [facility], please advise if this refill is appropriate.    KP

## 2015-01-17 ENCOUNTER — Other Ambulatory Visit: Payer: Self-pay | Admitting: Physician Assistant

## 2015-01-18 MED ORDER — CLONAZEPAM 1 MG PO TABS: 1.0000 mg | ORAL_TABLET | Freq: Every day | ORAL | Status: DC

## 2015-01-18 MED ORDER — ALBUTEROL SULFATE HFA 108 (90 BASE) MCG/ACT IN AERS
2.0000 | INHALATION_SPRAY | Freq: Four times a day (QID) | RESPIRATORY_TRACT | Status: DC | PRN
Start: 2015-01-18 — End: 2015-04-03

## 2015-01-18 NOTE — Telephone Encounter (Signed)
It's has been done.    KP

## 2015-01-18 NOTE — Addendum Note (Signed)
Addended by: Ewing Schlein on: 01/18/2015 08:30 AM   Modules accepted: Orders, Medications

## 2015-01-18 NOTE — Telephone Encounter (Signed)
Will defer further refills of patient's medications to PCP  

## 2015-02-19 DIAGNOSIS — M47816 Spondylosis without myelopathy or radiculopathy, lumbar region: Secondary | ICD-10-CM | POA: Diagnosis not present

## 2015-02-27 ENCOUNTER — Encounter: Payer: Self-pay | Admitting: Family Medicine

## 2015-02-27 DIAGNOSIS — G4700 Insomnia, unspecified: Secondary | ICD-10-CM

## 2015-02-28 MED ORDER — ZOLPIDEM TARTRATE 5 MG PO TABS: 5.0000 mg | ORAL_TABLET | Freq: Every evening | ORAL | Status: DC | PRN

## 2015-02-28 NOTE — Telephone Encounter (Signed)
Patient scheduled for 03/04/2015

## 2015-02-28 NOTE — Telephone Encounter (Signed)
Rx faxed to the [facility] on file.      KP

## 2015-02-28 NOTE — Telephone Encounter (Signed)
Refill ambien x1 

## 2015-02-28 NOTE — Telephone Encounter (Signed)
The Ambien was last filled on 09/13/14. Please advise     KP   Can you schedule this patient for a visit with Dr.Lowne for anxiety issues.    KP

## 2015-03-04 ENCOUNTER — Encounter: Payer: Self-pay | Admitting: Family Medicine

## 2015-03-04 ENCOUNTER — Ambulatory Visit (INDEPENDENT_AMBULATORY_CARE_PROVIDER_SITE_OTHER): Payer: Commercial Managed Care - HMO | Admitting: Family Medicine

## 2015-03-04 VITALS — BP 125/80 | HR 79 | Temp 98.1°F | Wt 151.6 lb

## 2015-03-04 DIAGNOSIS — R32 Unspecified urinary incontinence: Secondary | ICD-10-CM | POA: Diagnosis not present

## 2015-03-04 DIAGNOSIS — F411 Generalized anxiety disorder: Secondary | ICD-10-CM | POA: Diagnosis not present

## 2015-03-04 DIAGNOSIS — N393 Stress incontinence (female) (male): Secondary | ICD-10-CM

## 2015-03-04 LAB — POC URINALSYSI DIPSTICK (AUTOMATED)
Bilirubin, UA: NEGATIVE
Blood, UA: NEGATIVE
Glucose, UA: NEGATIVE
KETONES UA: NEGATIVE
Leukocytes, UA: NEGATIVE
Nitrite, UA: NEGATIVE
PH UA: 7
PROTEIN UA: NEGATIVE
SPEC GRAV UA: 1.015
Urobilinogen, UA: 2

## 2015-03-04 MED ORDER — SOLIFENACIN SUCCINATE 10 MG PO TABS: 10.0000 mg | ORAL_TABLET | Freq: Every day | ORAL | Status: DC

## 2015-03-04 MED ORDER — ESCITALOPRAM OXALATE 10 MG PO TABS
10.0000 mg | ORAL_TABLET | Freq: Every day | ORAL | Status: DC
Start: 2015-03-04 — End: 2015-05-21

## 2015-03-04 MED ORDER — CLONAZEPAM 1 MG PO TABS: 1.0000 mg | ORAL_TABLET | Freq: Every day | ORAL | Status: DC

## 2015-03-04 NOTE — Progress Notes (Signed)
Pre visit review using our clinic review tool, if applicable. No additional management support is needed unless otherwise documented below in the visit note. 

## 2015-03-04 NOTE — Assessment & Plan Note (Signed)
ua normal vesicare qd If no relief , refer to urology

## 2015-03-04 NOTE — Patient Instructions (Signed)
Generalized Anxiety Disorder Generalized anxiety disorder (GAD) is a mental disorder. It interferes with life functions, including relationships, work, and school. GAD is different from normal anxiety, which everyone experiences at some point in their lives in response to specific life events and activities. Normal anxiety actually helps us prepare for and get through these life events and activities. Normal anxiety goes away after the event or activity is over.  GAD causes anxiety that is not necessarily related to specific events or activities. It also causes excess anxiety in proportion to specific events or activities. The anxiety associated with GAD is also difficult to control. GAD can vary from mild to severe. People with severe GAD can have intense waves of anxiety with physical symptoms (panic attacks).  SYMPTOMS The anxiety and worry associated with GAD are difficult to control. This anxiety and worry are related to many life events and activities and also occur more days than not for 6 months or longer. People with GAD also have three or more of the following symptoms (one or more in children):  Restlessness.   Fatigue.  Difficulty concentrating.   Irritability.  Muscle tension.  Difficulty sleeping or unsatisfying sleep. DIAGNOSIS GAD is diagnosed through an assessment by your health care provider. Your health care provider will ask you questions aboutyour mood,physical symptoms, and events in your life. Your health care provider may ask you about your medical history and use of alcohol or drugs, including prescription medicines. Your health care provider may also do a physical exam and blood tests. Certain medical conditions and the use of certain substances can cause symptoms similar to those associated with GAD. Your health care provider may refer you to a mental health specialist for further evaluation. TREATMENT The following therapies are usually used to treat GAD:    Medication. Antidepressant medication usually is prescribed for long-term daily control. Antianxiety medicines may be added in severe cases, especially when panic attacks occur.   Talk therapy (psychotherapy). Certain types of talk therapy can be helpful in treating GAD by providing support, education, and guidance. A form of talk therapy called cognitive behavioral therapy can teach you healthy ways to think about and react to daily life events and activities.  Stress managementtechniques. These include yoga, meditation, and exercise and can be very helpful when they are practiced regularly. A mental health specialist can help determine which treatment is best for you. Some people see improvement with one therapy. However, other people require a combination of therapies.   This information is not intended to replace advice given to you by your health care provider. Make sure you discuss any questions you have with your health care provider.   Document Released: 04/25/2012 Document Revised: 01/19/2014 Document Reviewed: 04/25/2012 Elsevier Interactive Patient Education 2016 [employer]  
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
## 2015-03-06 DIAGNOSIS — M47816 Spondylosis without myelopathy or radiculopathy, lumbar region: Secondary | ICD-10-CM | POA: Diagnosis not present

## 2015-03-11 ENCOUNTER — Telehealth: Payer: Self-pay | Admitting: Family Medicine

## 2015-03-11 NOTE — Telephone Encounter (Signed)
Error

## 2015-04-02 ENCOUNTER — Ambulatory Visit (INDEPENDENT_AMBULATORY_CARE_PROVIDER_SITE_OTHER): Payer: Commercial Managed Care - HMO | Admitting: Family Medicine

## 2015-04-02 ENCOUNTER — Encounter: Payer: Self-pay | Admitting: Family Medicine

## 2015-04-02 VITALS — BP 140/80 | HR 77 | Temp 99.3°F | Ht <= 58 in | Wt 154.4 lb

## 2015-04-02 DIAGNOSIS — F411 Generalized anxiety disorder: Secondary | ICD-10-CM

## 2015-04-02 DIAGNOSIS — J209 Acute bronchitis, unspecified: Secondary | ICD-10-CM | POA: Diagnosis not present

## 2015-04-02 DIAGNOSIS — R079 Chest pain, unspecified: Secondary | ICD-10-CM | POA: Diagnosis not present

## 2015-04-02 DIAGNOSIS — J45909 Unspecified asthma, uncomplicated: Secondary | ICD-10-CM

## 2015-04-02 MED ORDER — CLONAZEPAM 1 MG PO TABS: 1.0000 mg | ORAL_TABLET | Freq: Every day | ORAL | Status: DC

## 2015-04-02 MED ORDER — AZITHROMYCIN 250 MG PO TABS: ORAL_TABLET | ORAL | Status: DC

## 2015-04-02 NOTE — Patient Instructions (Signed)

## 2015-04-02 NOTE — Progress Notes (Signed)
Pre visit review using our clinic review tool, if applicable. No additional management support is needed unless otherwise documented below in the visit note. 
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
## 2015-04-03 ENCOUNTER — Encounter: Payer: Self-pay | Admitting: Family Medicine

## 2015-04-03 DIAGNOSIS — J302 Other seasonal allergic rhinitis: Secondary | ICD-10-CM

## 2015-04-03 MED ORDER — ALBUTEROL SULFATE HFA 108 (90 BASE) MCG/ACT IN AERS: 2.0000 | INHALATION_SPRAY | Freq: Four times a day (QID) | RESPIRATORY_TRACT | Status: DC | PRN

## 2015-04-04 ENCOUNTER — Encounter: Payer: Commercial Managed Care - HMO | Admitting: Family Medicine

## 2015-04-05 ENCOUNTER — Encounter: Payer: Self-pay | Admitting: Family Medicine

## 2015-04-05 NOTE — Telephone Encounter (Signed)
[name], could you please contact this patient arrange follow up appointment?

## 2015-04-05 NOTE — Telephone Encounter (Signed)
Called to follow up with patient.  Left a message for call back.   

## 2015-04-06 ENCOUNTER — Ambulatory Visit: Payer: Commercial Managed Care - HMO | Admitting: Family Medicine

## 2015-04-06 ENCOUNTER — Encounter (HOSPITAL_COMMUNITY): Payer: Self-pay

## 2015-04-06 ENCOUNTER — Emergency Department (HOSPITAL_COMMUNITY)
Admission: EM | Admit: 2015-04-06 | Discharge: 2015-04-06 | Disposition: A | Discharge status: Home / Self Care | Payer: Commercial Managed Care - HMO | Attending: Emergency Medicine | Admitting: Emergency Medicine

## 2015-04-06 ENCOUNTER — Emergency Department (HOSPITAL_COMMUNITY): Payer: Commercial Managed Care - HMO

## 2015-04-06 ENCOUNTER — Encounter: Payer: Self-pay | Admitting: Family Medicine

## 2015-04-06 DIAGNOSIS — F329 Major depressive disorder, single episode, unspecified: Secondary | ICD-10-CM | POA: Diagnosis not present

## 2015-04-06 DIAGNOSIS — E785 Hyperlipidemia, unspecified: Secondary | ICD-10-CM | POA: Diagnosis not present

## 2015-04-06 DIAGNOSIS — Z792 Long term (current) use of antibiotics: Secondary | ICD-10-CM | POA: Diagnosis not present

## 2015-04-06 DIAGNOSIS — Z79891 Long term (current) use of opiate analgesic: Secondary | ICD-10-CM | POA: Diagnosis not present

## 2015-04-06 DIAGNOSIS — R918 Other nonspecific abnormal finding of lung field: Secondary | ICD-10-CM | POA: Diagnosis not present

## 2015-04-06 DIAGNOSIS — M1990 Unspecified osteoarthritis, unspecified site: Secondary | ICD-10-CM | POA: Insufficient documentation

## 2015-04-06 DIAGNOSIS — J209 Acute bronchitis, unspecified: Secondary | ICD-10-CM

## 2015-04-06 DIAGNOSIS — Z87891 Personal history of nicotine dependence: Secondary | ICD-10-CM | POA: Insufficient documentation

## 2015-04-06 DIAGNOSIS — R05 Cough: Secondary | ICD-10-CM | POA: Diagnosis present

## 2015-04-06 DIAGNOSIS — Z79899 Other long term (current) drug therapy: Secondary | ICD-10-CM | POA: Diagnosis not present

## 2015-04-06 MED ORDER — PREDNISONE 50 MG PO TABS
60.0000 mg | ORAL_TABLET | Freq: Once | ORAL | Status: AC
  Administered 2015-04-06: 60 mg via ORAL
  Filled 2015-04-06: qty 1

## 2015-04-06 MED ORDER — ALBUTEROL SULFATE (2.5 MG/3ML) 0.083% IN NEBU
5.0000 mg | INHALATION_SOLUTION | Freq: Once | RESPIRATORY_TRACT | Status: AC
Start: 2015-04-06 — End: 2015-04-06
  Administered 2015-04-06: 5 mg via RESPIRATORY_TRACT
  Filled 2015-04-06: qty 6

## 2015-04-06 MED ORDER — ALBUTEROL SULFATE (2.5 MG/3ML) 0.083% IN NEBU: 2.5000 mg | INHALATION_SOLUTION | Freq: Once | RESPIRATORY_TRACT | Status: DC

## 2015-04-06 MED ORDER — IPRATROPIUM-ALBUTEROL 0.5-2.5 (3) MG/3ML IN SOLN
3.0000 mL | Freq: Once | RESPIRATORY_TRACT | Status: AC
  Administered 2015-04-06: 3 mL via RESPIRATORY_TRACT

## 2015-04-06 MED ORDER — ALBUTEROL SULFATE (2.5 MG/3ML) 0.083% IN NEBU
5.0000 mg | INHALATION_SOLUTION | Freq: Once | RESPIRATORY_TRACT | Status: AC
  Administered 2015-04-06: 5 mg via RESPIRATORY_TRACT
  Filled 2015-04-06: qty 6

## 2015-04-06 MED ORDER — PREDNISONE 20 MG PO TABS: 40.0000 mg | ORAL_TABLET | Freq: Every day | ORAL | Status: AC

## 2015-04-06 MED ORDER — IPRATROPIUM-ALBUTEROL 0.5-2.5 (3) MG/3ML IN SOLN
RESPIRATORY_TRACT | Status: AC
  Filled 2015-04-06: qty 3

## 2015-04-06 NOTE — ED Notes (Signed)
Respiratory notified of new orders
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
## 2015-04-06 NOTE — ED Notes (Signed)
Pt stating chest feels tight. Scatterd Expiratory wheezes noted. Respiratory notified

## 2015-04-06 NOTE — ED Notes (Signed)
Pt states she has been coughing and has had a low grade fever.  Pt states the only pain she has is in her chest when she coughs.  Cough is non-productive, and she feels like she has some wheezes.  Pt just finished a z-pak yesterday

## 2015-04-06 NOTE — Discharge Instructions (Signed)
Use your albuterol inhaler 2 puffs every 4 hours for the next 48 hours. If your shortness of breath worsens or you need it more frequently, return to the ER or see your primary care doctor

## 2015-04-07 NOTE — Assessment & Plan Note (Signed)
abx per orders otc cough meds prn rto prn

## 2015-04-10 ENCOUNTER — Telehealth: Payer: Self-pay | Admitting: Family

## 2015-04-10 ENCOUNTER — Ambulatory Visit: Payer: Commercial Managed Care - HMO | Admitting: Family

## 2015-04-10 ENCOUNTER — Encounter: Payer: Self-pay | Admitting: Family Medicine

## 2015-04-10 NOTE — Telephone Encounter (Signed)
No charge. 

## 2015-04-10 NOTE — Telephone Encounter (Signed)
Pt called in stating that she took a shower and started coughing and got dizzy again. She said that it takes her 45 minutes to get here. She is afraid she won't make it plus said she doesn't want to come in here around sick people. She said that the prednisone is helping and she breathing better. Charge or no charge?

## 2015-04-12 NOTE — Telephone Encounter (Signed)
She needs ov 

## 2015-04-15 ENCOUNTER — Encounter: Payer: Self-pay | Admitting: Physician Assistant

## 2015-04-15 ENCOUNTER — Other Ambulatory Visit: Payer: Self-pay | Admitting: Family Medicine

## 2015-04-15 ENCOUNTER — Ambulatory Visit (INDEPENDENT_AMBULATORY_CARE_PROVIDER_SITE_OTHER): Payer: Commercial Managed Care - HMO | Admitting: Physician Assistant

## 2015-04-15 ENCOUNTER — Encounter: Payer: Self-pay | Admitting: Family Medicine

## 2015-04-15 VITALS — BP 120/68 | HR 75 | Temp 98.1°F | Ht <= 58 in | Wt 155.2 lb

## 2015-04-15 DIAGNOSIS — M79601 Pain in right arm: Secondary | ICD-10-CM

## 2015-04-15 DIAGNOSIS — J209 Acute bronchitis, unspecified: Secondary | ICD-10-CM | POA: Insufficient documentation

## 2015-04-15 MED ORDER — FLUCONAZOLE 150 MG PO TABS: 150.0000 mg | ORAL_TABLET | Freq: Once | ORAL | Status: DC

## 2015-04-15 MED ORDER — HYDROCODONE-HOMATROPINE 5-1.5 MG/5ML PO SYRP: 5.0000 mL | ORAL_SOLUTION | Freq: Four times a day (QID) | ORAL | Status: DC | PRN

## 2015-04-15 MED ORDER — AZITHROMYCIN 250 MG PO TABS: ORAL_TABLET | ORAL | Status: DC

## 2015-04-15 NOTE — Patient Instructions (Signed)
Take antibiotic (Azithromycin) and Diflucan as directed. No alcohol while on the Diflucan.  Increase fluids.  Get plenty of rest. Use Mucinex for congestion. Use the cough syrup given as directed for cough. Take a daily probiotic (I recommend Align or Culturelle, but even Activia Yogurt may be beneficial).  A humidifier placed in the bedroom may offer some relief for a dry, scratchy throat of nasal irritation.  Read information below on acute bronchitis. Please call or return to clinic if symptoms are not improving.  Acute Bronchitis Bronchitis is when the airways that extend from the windpipe into the lungs get red, puffy, and painful (inflamed). Bronchitis often causes thick spit (mucus) to develop. This leads to a cough. A cough is the most common symptom of bronchitis. In acute bronchitis, the condition usually begins suddenly and goes away over time (usually in 2 weeks). Smoking, allergies, and asthma can make bronchitis worse. Repeated episodes of bronchitis may cause more lung problems.  HOME CARE  Rest.  Drink enough fluids to keep your pee (urine) clear or pale yellow (unless you need to limit fluids as told by your doctor).  Only take over-the-counter or prescription medicines as told by your doctor.  Avoid smoking and secondhand smoke. These can make bronchitis worse. If you are a smoker, think about using nicotine gum or skin patches. Quitting smoking will help your lungs heal faster.  Reduce the chance of getting bronchitis again by:  Washing your hands often.  Avoiding people with cold symptoms.  Trying not to touch your hands to your mouth, nose, or eyes.  Follow up with your doctor as told.  GET HELP IF: Your symptoms do not improve after 1 week of treatment. Symptoms include:  Cough.  Fever.  Coughing up thick spit.  Body aches.  Chest congestion.  Chills.  Shortness of breath.  Sore throat.  GET HELP RIGHT AWAY IF:   You have an increased fever.  You  have chills.  You have severe shortness of breath.  You have bloody thick spit (sputum).  You throw up (vomit) often.  You lose too much body fluid (dehydration).  You have a severe headache.  You faint.  MAKE SURE YOU:   Understand these instructions.  Will watch your condition.  Will get help right away if you are not doing well or get worse. Document Released: 06/17/2007 Document Revised: 08/31/2012 Document Reviewed: 06/21/2012 [facility] Patient Information 2015 [place], [place]. This information is not intended to replace advice given to you by your health care provider. Make sure you discuss any questions you have with your health care provider.

## 2015-04-15 NOTE — Progress Notes (Signed)
Pre visit review using our clinic review tool, if applicable. No additional management support is needed unless otherwise documented below in the visit note. 

## 2015-04-15 NOTE — Telephone Encounter (Signed)
I put referral in for arm----  She would need to be seen for illness---- remind her if no fevers / etc a virus can last 18-19 days with or without treatment
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
## 2015-04-16 NOTE — Assessment & Plan Note (Signed)
Patient with multiple ABX allergies/intolerances. Did respond to Azithromycin and can tolerate well. Will repeat course. Flonase as directed. Saline nasal spray. Supportive measures and OTC medications reviewed.

## 2015-04-20 ENCOUNTER — Encounter (HOSPITAL_COMMUNITY): Payer: Self-pay | Admitting: Nurse Practitioner

## 2015-04-20 ENCOUNTER — Emergency Department (HOSPITAL_COMMUNITY): Payer: Commercial Managed Care - HMO

## 2015-04-20 ENCOUNTER — Emergency Department (HOSPITAL_COMMUNITY)
Admission: EM | Admit: 2015-04-20 | Discharge: 2015-04-20 | Disposition: A | Discharge status: Home / Self Care | Payer: Commercial Managed Care - HMO | Attending: Emergency Medicine | Admitting: Emergency Medicine

## 2015-04-20 DIAGNOSIS — K219 Gastro-esophageal reflux disease without esophagitis: Secondary | ICD-10-CM | POA: Insufficient documentation

## 2015-04-20 DIAGNOSIS — M810 Age-related osteoporosis without current pathological fracture: Secondary | ICD-10-CM | POA: Insufficient documentation

## 2015-04-20 DIAGNOSIS — S4991XA Unspecified injury of right shoulder and upper arm, initial encounter: Secondary | ICD-10-CM | POA: Diagnosis not present

## 2015-04-20 DIAGNOSIS — Z8669 Personal history of other diseases of the nervous system and sense organs: Secondary | ICD-10-CM | POA: Diagnosis not present

## 2015-04-20 DIAGNOSIS — Z7951 Long term (current) use of inhaled steroids: Secondary | ICD-10-CM | POA: Diagnosis not present

## 2015-04-20 DIAGNOSIS — Z8619 Personal history of other infectious and parasitic diseases: Secondary | ICD-10-CM | POA: Diagnosis not present

## 2015-04-20 DIAGNOSIS — M6281 Muscle weakness (generalized): Secondary | ICD-10-CM | POA: Diagnosis not present

## 2015-04-20 DIAGNOSIS — Z87891 Personal history of nicotine dependence: Secondary | ICD-10-CM | POA: Insufficient documentation

## 2015-04-20 DIAGNOSIS — Z79899 Other long term (current) drug therapy: Secondary | ICD-10-CM | POA: Diagnosis not present

## 2015-04-20 DIAGNOSIS — F329 Major depressive disorder, single episode, unspecified: Secondary | ICD-10-CM | POA: Diagnosis not present

## 2015-04-20 DIAGNOSIS — Z791 Long term (current) use of non-steroidal anti-inflammatories (NSAID): Secondary | ICD-10-CM | POA: Insufficient documentation

## 2015-04-20 DIAGNOSIS — K589 Irritable bowel syndrome without diarrhea: Secondary | ICD-10-CM | POA: Insufficient documentation

## 2015-04-20 DIAGNOSIS — H269 Unspecified cataract: Secondary | ICD-10-CM | POA: Insufficient documentation

## 2015-04-20 DIAGNOSIS — R531 Weakness: Secondary | ICD-10-CM | POA: Insufficient documentation

## 2015-04-20 DIAGNOSIS — E785 Hyperlipidemia, unspecified: Secondary | ICD-10-CM | POA: Diagnosis not present

## 2015-04-20 DIAGNOSIS — Z8709 Personal history of other diseases of the respiratory system: Secondary | ICD-10-CM | POA: Diagnosis not present

## 2015-04-20 DIAGNOSIS — M79601 Pain in right arm: Secondary | ICD-10-CM
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
## 2015-04-20 NOTE — Discharge Instructions (Signed)
Musculoskeletal Pain Musculoskeletal pain is muscle and boney aches and pains. These pains can occur in any part of the body. Your caregiver may treat you without knowing the cause of the pain. They may treat you if blood or urine tests, X-rays, and other tests were normal.  CAUSES There is often not a definite cause or reason for these pains. These pains may be caused by a type of germ (virus). The discomfort may also come from overuse. Overuse includes working out too hard when your body is not fit. Boney aches also come from weather changes. Bone is sensitive to atmospheric pressure changes. HOME CARE INSTRUCTIONS   Ask when your test results will be ready. Make sure you get your test results.  Only take over-the-counter or prescription medicines for pain, discomfort, or fever as directed by your caregiver. If you were given medications for your condition, do not drive, operate machinery or power tools, or sign legal documents for 24 hours. Do not drink alcohol. Do not take sleeping pills or other medications that may interfere with treatment.  Continue all activities unless the activities cause more pain. When the pain lessens, slowly resume normal activities. Gradually increase the intensity and duration of the activities or exercise.  During periods of severe pain, bed rest may be helpful. Lay or sit in any position that is comfortable.  Putting ice on the injured area.  Put ice in a bag.  Place a towel between your skin and the bag.  Leave the ice on for 15 to 20 minutes, 3 to 4 times a day.  Follow up with your caregiver for continued problems and no reason can be found for the pain. If the pain becomes worse or does not go away, it may be necessary to repeat tests or do additional testing. Your caregiver may need to look further for a possible cause. SEEK IMMEDIATE MEDICAL CARE IF:  You have pain that is getting worse and is not relieved by medications.  You develop chest pain  that is associated with shortness or breath, sweating, feeling sick to your stomach (nauseous), or throw up (vomit).  Your pain becomes localized to the abdomen.  You develop any new symptoms that seem different or that concern you. MAKE SURE YOU:   Understand these instructions.  Will watch your condition.  Will get help right away if you are not doing well or get worse.   This information is not intended to replace advice given to you by your health care provider. Make sure you discuss any questions you have with your health care provider.   Document Released: 12/29/2004 Document Revised: 03/23/2011 Document Reviewed: 09/02/2012 Elsevier Interactive Patient Education 2016 [place].  Cryotherapy Cryotherapy means treatment with cold. Ice or gel packs can be used to reduce both pain and swelling. Ice is the most helpful within the first 24 to 48 hours after an injury or flare-up from overusing a muscle or joint. Sprains, strains, spasms, burning pain, shooting pain, and aches can all be eased with ice. Ice can also be used when recovering from surgery. Ice is effective, has very few side effects, and is safe for most people to use. PRECAUTIONS  Ice is not a safe treatment option for people with:  Raynaud phenomenon. This is a condition affecting small blood vessels in the extremities. Exposure to cold may cause your problems to return.  Cold hypersensitivity. There are many forms of cold hypersensitivity, including:  Cold urticaria. Red, itchy hives appear on the  skin when the tissues begin to warm after being iced.  Cold erythema. This is a red, itchy rash caused by exposure to cold.  Cold hemoglobinuria. Red blood cells break down when the tissues begin to warm after being iced. The hemoglobin that carry oxygen are passed into the urine because they cannot combine with blood proteins fast enough.  Numbness or altered sensitivity in the area being iced. If you have any of the  following conditions, do not use ice until you have discussed cryotherapy with your caregiver:  Heart conditions, such as arrhythmia, angina, or chronic heart disease.  High blood pressure.  Healing wounds or open skin in the area being iced.  Current infections.  Rheumatoid arthritis.  Poor circulation.  Diabetes. Ice slows the blood flow in the region it is applied. This is beneficial when trying to stop inflamed tissues from spreading irritating chemicals to surrounding tissues. However, if you expose your skin to cold temperatures for too long or without the proper protection, you can damage your skin or nerves. Watch for signs of skin damage due to cold. HOME CARE INSTRUCTIONS Follow these tips to use ice and cold packs safely.  Place a dry or damp towel between the ice and skin. A damp towel will cool the skin more quickly, so you may need to shorten the time that the ice is used.  For a more rapid response, add gentle compression to the ice.  Ice for no more than 10 to 20 minutes at a time. The bonier the area you are icing, the less time it will take to get the benefits of ice.  Check your skin after 5 minutes to make sure there are no signs of a poor response to cold or skin damage.  Rest 20 minutes or more between uses.  Once your skin is numb, you can end your treatment. You can test numbness by very lightly touching your skin. The touch should be so light that you do not see the skin dimple from the pressure of your fingertip. When using ice, most people will feel these normal sensations in this order: cold, burning, aching, and numbness.  Do not use ice on someone who cannot communicate their responses to pain, such as small children or people with dementia. HOW TO MAKE AN ICE PACK Ice packs are the most common way to use ice therapy. Other methods include ice massage, ice baths, and cryosprays. Muscle creams that cause a cold, tingly feeling do not offer the same  benefits that ice offers and should not be used as a substitute unless recommended by your caregiver. To make an ice pack, do one of the following:  Place crushed ice or a bag of frozen vegetables in a sealable plastic bag. Squeeze out the excess air. Place this bag inside another plastic bag. Slide the bag into a pillowcase or place a damp towel between your skin and the bag.  Mix 3 parts water with 1 part rubbing alcohol. Freeze the mixture in a sealable plastic bag. When you remove the mixture from the freezer, it will be slushy. Squeeze out the excess air. Place this bag inside another plastic bag. Slide the bag into a pillowcase or place a damp towel between your skin and the bag. SEEK MEDICAL CARE IF:  You develop white spots on your skin. This may give the skin a blotchy (mottled) appearance.  Your skin turns blue or pale.  Your skin becomes waxy or hard.  Your swelling gets  worse. MAKE SURE YOU:   Understand these instructions.  Will watch your condition.  Will get help right away if you are not doing well or get worse.   This information is not intended to replace advice given to you by your health care provider. Make sure you discuss any questions you have with your health care provider.  Keep your scheduled appointment with Dr. Rolena Infante on Monday for re-evaluation. Take home pain medication as needed. Keep arm in sling to avoid over-use injury. Apply ice to affected area. Return to the ED if you experience severe worsening of your symptoms, increased swelling or redness of arm, numbness or weakness in your arm, chest pain or shortness of breath.

## 2015-04-22 ENCOUNTER — Encounter: Payer: Self-pay | Admitting: Family Medicine

## 2015-04-22 ENCOUNTER — Ambulatory Visit (INDEPENDENT_AMBULATORY_CARE_PROVIDER_SITE_OTHER): Payer: Commercial Managed Care - HMO | Admitting: Family Medicine

## 2015-04-22 ENCOUNTER — Ambulatory Visit (HOSPITAL_BASED_OUTPATIENT_CLINIC_OR_DEPARTMENT_OTHER)
Admission: RE | Admit: 2015-04-22 | Discharge: 2015-04-22 | Disposition: A | Discharge status: Home / Self Care | Payer: Commercial Managed Care - HMO | Source: Ambulatory Visit | Attending: Family Medicine | Admitting: Family Medicine

## 2015-04-22 VITALS — BP 122/70 | HR 95 | Temp 98.3°F | Wt 157.4 lb

## 2015-04-22 DIAGNOSIS — J302 Other seasonal allergic rhinitis: Secondary | ICD-10-CM | POA: Diagnosis not present

## 2015-04-22 DIAGNOSIS — R059 Cough, unspecified: Secondary | ICD-10-CM

## 2015-04-22 DIAGNOSIS — J209 Acute bronchitis, unspecified: Secondary | ICD-10-CM

## 2015-04-22 DIAGNOSIS — R05 Cough: Secondary | ICD-10-CM

## 2015-04-22 DIAGNOSIS — R062 Wheezing: Secondary | ICD-10-CM | POA: Insufficient documentation

## 2015-04-22 DIAGNOSIS — M7541 Impingement syndrome of right shoulder: Secondary | ICD-10-CM | POA: Diagnosis not present

## 2015-04-22 DIAGNOSIS — R079 Chest pain, unspecified: Secondary | ICD-10-CM | POA: Diagnosis not present

## 2015-04-22 MED ORDER — ALBUTEROL SULFATE HFA 108 (90 BASE) MCG/ACT IN AERS: 2.0000 | INHALATION_SPRAY | Freq: Four times a day (QID) | RESPIRATORY_TRACT | Status: DC | PRN

## 2015-04-22 MED ORDER — PREDNISONE 10 MG PO TABS: ORAL_TABLET | ORAL | Status: DC

## 2015-04-22 MED ORDER — FLUTICASONE PROPIONATE 50 MCG/ACT NA SUSP: 2.0000 | Freq: Every day | NASAL | Status: DC

## 2015-04-22 MED ORDER — GABAPENTIN 300 MG PO CAPS: 300.0000 mg | ORAL_CAPSULE | Freq: Three times a day (TID) | ORAL | Status: DC

## 2015-04-22 MED ORDER — CLARITHROMYCIN ER 500 MG PO TB24: 1000.0000 mg | ORAL_TABLET | Freq: Every day | ORAL | Status: AC

## 2015-04-22 NOTE — Patient Instructions (Signed)
Bronchospasm, Adult  A bronchospasm is a spasm or tightening of the airways going into the lungs. During a bronchospasm breathing becomes more difficult because the airways get smaller. When this happens there can be coughing, a whistling sound when breathing (wheezing), and difficulty breathing. Bronchospasm is often associated with asthma, but not all patients who experience a bronchospasm have asthma.  CAUSES   A bronchospasm is caused by inflammation or irritation of the airways. The inflammation or irritation may be triggered by:   · Allergies (such as to animals, pollen, food, or mold). Allergens that cause bronchospasm may cause wheezing immediately after exposure or many hours later.    · Infection. Viral infections are believed to be the most common cause of bronchospasm.    · Exercise.    · Irritants (such as pollution, cigarette smoke, strong odors, aerosol sprays, and paint fumes).    · Weather changes. Winds increase molds and pollens in the air. Rain refreshes the air by washing irritants out. Cold air may cause inflammation.    · Stress and emotional upset.    SIGNS AND SYMPTOMS   · Wheezing.    · Excessive nighttime coughing.    · Frequent or severe coughing with a simple cold.    · Chest tightness.    · Shortness of breath.    DIAGNOSIS   Bronchospasm is usually diagnosed through a history and physical exam. Tests, such as chest X-rays, are sometimes done to look for other conditions.  TREATMENT   · Inhaled medicines can be given to open up your airways and help you breathe. The medicines can be given using either an inhaler or a nebulizer machine.  · Corticosteroid medicines may be given for severe bronchospasm, usually when it is associated with asthma.  HOME CARE INSTRUCTIONS   · Always have a plan prepared for seeking medical care. Know when to call your health care provider and local emergency services (911 in the [place] Know where you can access local emergency care.  · Only take medicines as  directed by your health care provider.  · If you were prescribed an inhaler or nebulizer machine, ask your health care provider to explain how to use it correctly. Always use a spacer with your inhaler if you were given one.  · It is necessary to remain calm during an attack. Try to relax and breathe more slowly.   · Control your home environment in the following ways:      Change your heating and air conditioning filter at least once a month.      Limit your use of fireplaces and wood stoves.    Do not smoke and do not allow smoking in your home.      Avoid exposure to perfumes and fragrances.      Get rid of pests (such as roaches and mice) and their droppings.      Throw away plants if you see mold on them.      Keep your house clean and dust free.      Replace carpet with wood, tile, or vinyl flooring. Carpet can trap dander and dust.      Use allergy-proof pillows, mattress covers, and box spring covers.      Wash bed sheets and blankets every week in hot water and dry them in a dryer.      Use blankets that are made of polyester or cotton.      Wash hands frequently.  SEEK MEDICAL CARE IF:   · You have muscle aches.    · You have chest pain.    · The sputum changes from clear or   white to yellow, green, gray, or bloody.    · The sputum you cough up gets thicker.    · There are problems that may be related to the medicine you are given, such as a rash, itching, swelling, or trouble breathing.    SEEK IMMEDIATE MEDICAL CARE IF:   · You have worsening wheezing and coughing even after taking your prescribed medicines.    · You have increased difficulty breathing.    · You develop severe chest pain.  MAKE SURE YOU:   · Understand these instructions.  · Will watch your condition.  · Will get help right away if you are not doing well or get worse.     This information is not intended to replace advice given to you by your health care provider. Make sure you discuss any questions you have with your health care  provider.     Document Released: 01/01/2003 Document Revised: 01/19/2014 Document Reviewed: 06/20/2012  Elsevier Interactive Patient Education ©2016 [employer]

## 2015-04-22 NOTE — Assessment & Plan Note (Signed)
con't inhaler prn abx per orders cxr pred taper start tomorrow She still has rx for cough med with codeine prn
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
## 2015-04-22 NOTE — Progress Notes (Signed)
Pre visit review using our clinic review tool, if applicable. No additional management support is needed unless otherwise documented below in the visit note. 

## 2015-04-23 ENCOUNTER — Encounter: Payer: Self-pay | Admitting: Family Medicine

## 2015-04-23 NOTE — Telephone Encounter (Signed)
Pt seen on 04/22/2015.

## 2015-04-29 DIAGNOSIS — M7541 Impingement syndrome of right shoulder: Secondary | ICD-10-CM | POA: Diagnosis not present

## 2015-04-30 DIAGNOSIS — M12811 Other specific arthropathies, not elsewhere classified, right shoulder: Secondary | ICD-10-CM | POA: Diagnosis not present

## 2015-05-06 ENCOUNTER — Ambulatory Visit: Payer: Commercial Managed Care - HMO | Admitting: Family

## 2015-05-06 ENCOUNTER — Encounter: Payer: Self-pay | Admitting: Family

## 2015-05-06 NOTE — Telephone Encounter (Signed)
[name]-- pt is scheduled to see [place] on 05/08/15.  Please advise?

## 2015-05-07 ENCOUNTER — Ambulatory Visit (INDEPENDENT_AMBULATORY_CARE_PROVIDER_SITE_OTHER): Payer: Commercial Managed Care - HMO | Admitting: Physician Assistant

## 2015-05-07 ENCOUNTER — Encounter: Payer: Self-pay | Admitting: Physician Assistant

## 2015-05-07 VITALS — BP 96/50 | HR 78 | Temp 98.0°F | Ht <= 58 in | Wt 155.4 lb

## 2015-05-07 DIAGNOSIS — K5900 Constipation, unspecified: Secondary | ICD-10-CM

## 2015-05-07 DIAGNOSIS — R1011 Right upper quadrant pain: Secondary | ICD-10-CM

## 2015-05-07 LAB — CBC WITH DIFFERENTIAL/PLATELET
BASOS ABS: 0.1 10*3/uL (ref 0.0–0.1)
Basophils Relative: 0.6 % (ref 0.0–3.0)
EOS ABS: 0.3 10*3/uL (ref 0.0–0.7)
Eosinophils Relative: 2.9 % (ref 0.0–5.0)
HCT: 39.8 % (ref 36.0–46.0)
HEMOGLOBIN: 13.3 g/dL (ref 12.0–15.0)
LYMPHS ABS: 4.2 10*3/uL — AB (ref 0.7–4.0)
Lymphocytes Relative: 38 % (ref 12.0–46.0)
MCHC: 33.3 g/dL (ref 30.0–36.0)
MCV: 86.8 fl (ref 78.0–100.0)
MONO ABS: 1.2 10*3/uL — AB (ref 0.1–1.0)
Monocytes Relative: 11.2 % (ref 3.0–12.0)
NEUTROS PCT: 47.3 % (ref 43.0–77.0)
Neutro Abs: 5.2 10*3/uL (ref 1.4–7.7)
Platelets: 457 10*3/uL — ABNORMAL HIGH (ref 150.0–400.0)
RBC: 4.59 Mil/uL (ref 3.87–5.11)
RDW: 13.6 % (ref 11.5–15.5)
WBC: 11.1 10*3/uL — AB (ref 4.0–10.5)

## 2015-05-07 LAB — COMPREHENSIVE METABOLIC PANEL
ALBUMIN: 4.3 g/dL (ref 3.5–5.2)
ALK PHOS: 52 U/L (ref 39–117)
ALT: 40 U/L — ABNORMAL HIGH (ref 0–35)
AST: 36 U/L (ref 0–37)
BUN: 14 mg/dL (ref 6–23)
CO2: 30 mEq/L (ref 19–32)
CREATININE: 1.04 mg/dL (ref 0.40–1.20)
Calcium: 9.8 mg/dL (ref 8.4–10.5)
Chloride: 100 mEq/L (ref 96–112)
GFR: 56.47 mL/min — ABNORMAL LOW (ref >60.00)
GLUCOSE: 102 mg/dL — AB (ref 70–99)
Potassium: 3.7 mEq/L (ref 3.5–5.1)
SODIUM: 137 meq/L (ref 135–145)
TOTAL PROTEIN: 7.4 g/dL (ref 6.0–8.3)
Total Bilirubin: 0.5 mg/dL (ref 0.2–1.2)

## 2015-05-07 MED ORDER — VALACYCLOVIR HCL 1 G PO TABS: 1000.0000 mg | ORAL_TABLET | Freq: Two times a day (BID) | ORAL | Status: DC

## 2015-05-07 NOTE — Patient Instructions (Signed)
Please go to the lab for blood work. I will call with your results. Reconsider letting me get some imaging.  I encourage you to increase hydration and the amount of fiber in your diet.  Start a daily probiotic (Align, Culturelle, Digestive Advantage, etc.). If no bowel movement within 24 hours, take 2 Tbs of Milk of Magnesia in a 4 oz glass of warmed prune juice every 2-3 days to help promote bowel movement. If no results within 24 hours, then repeat above regimen, adding a Dulcolax stool softener to regimen.  You may also choose to restart your Miralax as before if you would prefer.
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
## 2015-05-07 NOTE — Progress Notes (Signed)
Pre visit review using our clinic review tool, if applicable. No additional management support is needed unless otherwise documented below in the visit note. 

## 2015-05-08 ENCOUNTER — Ambulatory Visit: Payer: Self-pay | Admitting: Family

## 2015-05-13 DIAGNOSIS — M12811 Other specific arthropathies, not elsewhere classified, right shoulder: Secondary | ICD-10-CM | POA: Diagnosis not present

## 2015-05-15 DIAGNOSIS — M12811 Other specific arthropathies, not elsewhere classified, right shoulder: Secondary | ICD-10-CM | POA: Diagnosis not present

## 2015-05-21 ENCOUNTER — Other Ambulatory Visit: Payer: Self-pay | Admitting: Hematology & Oncology

## 2015-05-21 ENCOUNTER — Ambulatory Visit (HOSPITAL_BASED_OUTPATIENT_CLINIC_OR_DEPARTMENT_OTHER)
Admission: RE | Admit: 2015-05-21 | Discharge: 2015-05-21 | Disposition: A | Discharge status: Home / Self Care | Payer: Commercial Managed Care - HMO | Source: Ambulatory Visit | Attending: Family Medicine | Admitting: Family Medicine

## 2015-05-21 ENCOUNTER — Encounter: Payer: Self-pay | Admitting: Family Medicine

## 2015-05-21 ENCOUNTER — Ambulatory Visit (INDEPENDENT_AMBULATORY_CARE_PROVIDER_SITE_OTHER): Payer: Commercial Managed Care - HMO | Admitting: Family Medicine

## 2015-05-21 VITALS — BP 120/70 | HR 91 | Temp 98.4°F | Wt 157.6 lb

## 2015-05-21 DIAGNOSIS — F329 Major depressive disorder, single episode, unspecified: Secondary | ICD-10-CM | POA: Diagnosis not present

## 2015-05-21 DIAGNOSIS — E785 Hyperlipidemia, unspecified: Secondary | ICD-10-CM

## 2015-05-21 DIAGNOSIS — M79661 Pain in right lower leg: Secondary | ICD-10-CM | POA: Diagnosis not present

## 2015-05-21 DIAGNOSIS — M25561 Pain in right knee: Secondary | ICD-10-CM | POA: Diagnosis not present

## 2015-05-21 DIAGNOSIS — M7989 Other specified soft tissue disorders: Secondary | ICD-10-CM | POA: Diagnosis not present

## 2015-05-21 DIAGNOSIS — K219 Gastro-esophageal reflux disease without esophagitis: Secondary | ICD-10-CM

## 2015-05-21 DIAGNOSIS — R413 Other amnesia: Secondary | ICD-10-CM

## 2015-05-21 DIAGNOSIS — F32A Depression, unspecified: Secondary | ICD-10-CM

## 2015-05-21 MED ORDER — PANTOPRAZOLE SODIUM 40 MG PO TBEC: 40.0000 mg | DELAYED_RELEASE_TABLET | Freq: Every day | ORAL | Status: DC

## 2015-05-21 MED ORDER — ESCITALOPRAM OXALATE 20 MG PO TABS: 20.0000 mg | ORAL_TABLET | Freq: Every day | ORAL | Status: DC

## 2015-05-21 NOTE — Patient Instructions (Signed)
Food Choices for Gastroesophageal Reflux Disease, Adult When you have gastroesophageal reflux disease (GERD), the foods you eat and your eating habits are very important. Choosing the right foods can help ease the discomfort of GERD. WHAT GENERAL GUIDELINES DO I NEED TO FOLLOW?  Choose fruits, vegetables, whole grains, low-fat dairy products, and low-fat meat, fish, and poultry.  Limit fats such as oils, salad dressings, butter, nuts, and avocado.  Keep a food diary to identify foods that cause symptoms.  Avoid foods that cause reflux. These may be different for different people.  Eat frequent small meals instead of three large meals each day.  Eat your meals slowly, in a relaxed setting.  Limit fried foods.  Cook foods using methods other than frying.  Avoid drinking alcohol.  Avoid drinking large amounts of liquids with your meals.  Avoid bending over or lying down until 2-3 hours after eating. WHAT FOODS ARE NOT RECOMMENDED? The following are some foods and drinks that may worsen your symptoms: Vegetables Tomatoes. Tomato juice. Tomato and spaghetti sauce. Chili peppers. Onion and garlic. Horseradish. Fruits Oranges, grapefruit, and lemon (fruit and juice). Meats High-fat meats, fish, and poultry. This includes hot dogs, ribs, ham, sausage, salami, and bacon. Dairy Whole milk and chocolate milk. Sour cream. Cream. Butter. Ice cream. Cream cheese.  Beverages Coffee and tea, with or without caffeine. Carbonated beverages or energy drinks. Condiments Hot sauce. Barbecue sauce.  Sweets/Desserts Chocolate and cocoa. Donuts. Peppermint and spearmint. Fats and Oils High-fat foods, including French fries and potato chips. Other Vinegar. Strong spices, such as black pepper, white pepper, red pepper, cayenne, curry powder, cloves, ginger, and chili powder. The items listed above may not be a complete list of foods and beverages to avoid. Contact your dietitian for more  information.   This information is not intended to replace advice given to you by your health care provider. Make sure you discuss any questions you have with your health care provider.   Document Released: 12/29/2004 Document Revised: 01/19/2014 Document Reviewed: 11/02/2012 [employer] Interactive Patient Education 2016 [employer]  

## 2015-05-21 NOTE — Progress Notes (Signed)
Pre visit review using our clinic review tool, if applicable. No additional management support is needed unless otherwise documented below in the visit note. 
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
## 2015-05-22 DIAGNOSIS — M12811 Other specific arthropathies, not elsewhere classified, right shoulder: Secondary | ICD-10-CM | POA: Diagnosis not present

## 2015-05-28 DIAGNOSIS — Z96653 Presence of artificial knee joint, bilateral: Secondary | ICD-10-CM | POA: Diagnosis not present

## 2015-05-28 DIAGNOSIS — Z471 Aftercare following joint replacement surgery: Secondary | ICD-10-CM | POA: Diagnosis not present

## 2015-05-29 DIAGNOSIS — M12811 Other specific arthropathies, not elsewhere classified, right shoulder: Secondary | ICD-10-CM | POA: Diagnosis not present

## 2015-06-12 DIAGNOSIS — M47816 Spondylosis without myelopathy or radiculopathy, lumbar region: Secondary | ICD-10-CM | POA: Diagnosis not present

## 2015-06-18 ENCOUNTER — Telehealth: Payer: Self-pay | Admitting: Family Medicine

## 2015-06-18 NOTE — Telephone Encounter (Signed)
Issues cant be done during physical anyway--- medicare won't pay for it.    She would need a 30 min acute (because she always needs 30 min) then cpe can be done later.

## 2015-06-18 NOTE — Telephone Encounter (Signed)
Contacted patient to reschedule upcoming appointment for a CPE with Dr. [name] due to providers schedule change. Patient request to be seen before next available CPE (9/5) because she had to wait 3 months for this appointment. States she haas some issues she need to discuss with her provider. Plse adv.

## 2015-06-18 NOTE — Telephone Encounter (Signed)
Please advise      KP 

## 2015-06-19 NOTE — Telephone Encounter (Signed)
Pt called back stating that she was not called back yesterday and was upset. She said that she cannot wait til September for CPE and this is the 2nd time we've rescheduled her. She said the "issues" to be addressed are pap and lab work. She said she lives in pain but has other doctors for that. She can come up with a reason to come in sooner so she can get this done. Advised pt I was looking for cancellations or somewhere to fit her in. Pt call was disconnected.

## 2015-06-20 ENCOUNTER — Encounter: Payer: Self-pay | Admitting: Neurology

## 2015-06-20 ENCOUNTER — Ambulatory Visit (INDEPENDENT_AMBULATORY_CARE_PROVIDER_SITE_OTHER): Payer: Commercial Managed Care - HMO | Admitting: Neurology

## 2015-06-20 VITALS — BP 100/60 | HR 98 | Ht <= 58 in | Wt 154.3 lb

## 2015-06-20 DIAGNOSIS — R4189 Other symptoms and signs involving cognitive functions and awareness: Secondary | ICD-10-CM

## 2015-06-20 DIAGNOSIS — F39 Unspecified mood [affective] disorder: Secondary | ICD-10-CM
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
## 2015-06-24 ENCOUNTER — Encounter: Payer: Commercial Managed Care - HMO | Admitting: Family Medicine

## 2015-06-27 DIAGNOSIS — M47816 Spondylosis without myelopathy or radiculopathy, lumbar region: Secondary | ICD-10-CM | POA: Diagnosis not present

## 2015-06-27 DIAGNOSIS — G894 Chronic pain syndrome: Secondary | ICD-10-CM | POA: Diagnosis not present

## 2015-06-27 DIAGNOSIS — M5033 Other cervical disc degeneration, cervicothoracic region: Secondary | ICD-10-CM | POA: Diagnosis not present

## 2015-06-27 DIAGNOSIS — Z79891 Long term (current) use of opiate analgesic: Secondary | ICD-10-CM | POA: Diagnosis not present

## 2015-07-01 ENCOUNTER — Other Ambulatory Visit: Payer: Self-pay | Admitting: Family Medicine

## 2015-07-01 ENCOUNTER — Ambulatory Visit: Payer: Commercial Managed Care - HMO | Admitting: Physician Assistant

## 2015-07-01 DIAGNOSIS — G4700 Insomnia, unspecified: Secondary | ICD-10-CM

## 2015-07-01 MED ORDER — ZOLPIDEM TARTRATE 5 MG PO TABS: 5.0000 mg | ORAL_TABLET | Freq: Every evening | ORAL | Status: DC | PRN

## 2015-07-01 NOTE — Telephone Encounter (Signed)
Last seen 05/21/15 and filled 02/28/15 #90   Please advise     KP

## 2015-07-11 ENCOUNTER — Other Ambulatory Visit: Payer: Self-pay | Admitting: Family Medicine

## 2015-07-11 DIAGNOSIS — I10 Essential (primary) hypertension: Secondary | ICD-10-CM

## 2015-07-11 MED ORDER — HYDROCHLOROTHIAZIDE 25 MG PO TABS: 25.0000 mg | ORAL_TABLET | Freq: Every day | ORAL | Status: DC

## 2015-07-11 MED ORDER — CYCLOBENZAPRINE HCL 10 MG PO TABS: 10.0000 mg | ORAL_TABLET | Freq: Two times a day (BID) | ORAL | Status: DC | PRN

## 2015-07-11 NOTE — Telephone Encounter (Signed)
Flexeril filled 11/20/14 #180. Please advise    KP

## 2015-07-18 ENCOUNTER — Encounter: Payer: Commercial Managed Care - HMO | Admitting: Psychology

## 2015-07-19 ENCOUNTER — Other Ambulatory Visit: Payer: Self-pay | Admitting: Family Medicine

## 2015-07-19 ENCOUNTER — Other Ambulatory Visit: Payer: Self-pay | Admitting: Medical

## 2015-07-19 DIAGNOSIS — F411 Generalized anxiety disorder: Secondary | ICD-10-CM

## 2015-07-19 NOTE — Telephone Encounter (Signed)
Last seen 05/21/15 and filled 04/02/15 #90 UDS 02/02/13 low risk   Please advise    KP

## 2015-07-19 NOTE — Telephone Encounter (Signed)
Sorry did not see this until after 5 pm. Was very busy this am and had my own Dr. Appointment early afternoon. So if you would get [name] to sign. If she is not in on Monday then I will sign.

## 2015-07-19 NOTE — Telephone Encounter (Signed)
Sorry did not see this until afternoon after 5 pm. Was very busy this am and had my own Dr. Appointment early afternoon. Not there to sign rx. So if you would have [name] to sign on Monday. If she is not available on Monday then I will sign.

## 2015-07-22 ENCOUNTER — Other Ambulatory Visit: Payer: Self-pay | Admitting: Family Medicine

## 2015-07-22 ENCOUNTER — Other Ambulatory Visit: Payer: Self-pay

## 2015-07-22 DIAGNOSIS — F411 Generalized anxiety disorder: Secondary | ICD-10-CM

## 2015-07-22 MED ORDER — CLONAZEPAM 1 MG PO TABS: 1.0000 mg | ORAL_TABLET | Freq: Every day | ORAL | Status: DC

## 2015-07-22 NOTE — Addendum Note (Signed)
Addended by: Ewing Schlein on: 07/22/2015 09:31 AM   Modules accepted: Orders

## 2015-07-22 NOTE — Telephone Encounter (Signed)
Klonopin Last seen 05/21/15 and filled 04/02/15 #90 UDS 02/02/13 low risk   Please advise KP

## 2015-07-30 ENCOUNTER — Encounter: Payer: Commercial Managed Care - HMO | Admitting: Psychology

## 2015-07-30 DIAGNOSIS — M4310 Spondylolisthesis, site unspecified: Secondary | ICD-10-CM | POA: Diagnosis not present

## 2015-07-30 DIAGNOSIS — M5136 Other intervertebral disc degeneration, lumbar region: Secondary | ICD-10-CM | POA: Diagnosis not present

## 2015-09-02 ENCOUNTER — Ambulatory Visit (HOSPITAL_BASED_OUTPATIENT_CLINIC_OR_DEPARTMENT_OTHER)
Admission: RE | Admit: 2015-09-02 | Discharge: 2015-09-02 | Disposition: A | Discharge status: Home / Self Care | Payer: Commercial Managed Care - HMO | Source: Ambulatory Visit | Attending: Family Medicine | Admitting: Family Medicine

## 2015-09-02 ENCOUNTER — Encounter: Payer: Self-pay | Admitting: Family Medicine

## 2015-09-02 ENCOUNTER — Ambulatory Visit (INDEPENDENT_AMBULATORY_CARE_PROVIDER_SITE_OTHER): Payer: Commercial Managed Care - HMO | Admitting: Family Medicine

## 2015-09-02 ENCOUNTER — Ambulatory Visit: Payer: Commercial Managed Care - HMO | Admitting: Family Medicine

## 2015-09-02 VITALS — BP 110/78 | HR 73 | Temp 98.0°F | Wt 154.4 lb

## 2015-09-02 DIAGNOSIS — M810 Age-related osteoporosis without current pathological fracture: Secondary | ICD-10-CM | POA: Diagnosis not present

## 2015-09-02 DIAGNOSIS — M19032 Primary osteoarthritis, left wrist: Secondary | ICD-10-CM | POA: Insufficient documentation

## 2015-09-02 DIAGNOSIS — E785 Hyperlipidemia, unspecified: Secondary | ICD-10-CM

## 2015-09-02 DIAGNOSIS — R635 Abnormal weight gain: Secondary | ICD-10-CM | POA: Diagnosis not present

## 2015-09-02 DIAGNOSIS — M25532 Pain in left wrist: Secondary | ICD-10-CM | POA: Diagnosis not present

## 2015-09-02 DIAGNOSIS — E559 Vitamin D deficiency, unspecified: Secondary | ICD-10-CM | POA: Diagnosis not present

## 2015-09-02 DIAGNOSIS — G894 Chronic pain syndrome: Secondary | ICD-10-CM

## 2015-09-02 DIAGNOSIS — T782XXA Anaphylactic shock, unspecified, initial encounter: Secondary | ICD-10-CM

## 2015-09-02 DIAGNOSIS — D352 Benign neoplasm of pituitary gland: Secondary | ICD-10-CM | POA: Diagnosis not present

## 2015-09-02 DIAGNOSIS — M25539 Pain in unspecified wrist: Secondary | ICD-10-CM

## 2015-09-02 MED ORDER — EPINEPHRINE 0.3 MG/0.3ML IJ SOAJ: INTRAMUSCULAR | 0 refills | Status: DC

## 2015-09-02 NOTE — Patient Instructions (Signed)
Obesity Obesity is defined as having too much total body fat and a body mass index (BMI) of 30 or more. BMI is an estimate of body fat and is calculated from your height and weight. BMI is typically calculated by your health care provider during regular wellness visits. Obesity happens when you consume more calories than you can burn by exercising or performing daily physical tasks. Prolonged obesity can cause major illnesses or emergencies, such as:  Stroke.  Heart disease.  Diabetes.  Cancer.  Arthritis.  High blood pressure (hypertension).  High cholesterol.  Sleep apnea.  Erectile dysfunction.  Infertility problems. CAUSES   Regularly eating unhealthy foods.  Physical inactivity.  Certain disorders, such as an underactive thyroid (hypothyroidism), Cushing's syndrome, and polycystic ovarian syndrome.  Certain medicines, such as steroids, some depression medicines, and antipsychotics.  Genetics.  Lack of sleep. DIAGNOSIS A health care provider can diagnose obesity after calculating your BMI. Obesity will be diagnosed if your BMI is 30 or higher. There are other methods of measuring obesity levels. Some other methods include measuring your skinfold thickness, your waist circumference, and comparing your hip circumference to your waist circumference. TREATMENT  A healthy treatment program includes some or all of the following:  Long-term dietary changes.  Exercise and physical activity.  Behavioral and lifestyle changes.  Medicine only under the supervision of your health care provider. Medicines may help, but only if they are used with diet and exercise programs. If your BMI is 40 or higher, your health care provider may recommend specialized surgery or programs to help with weight loss. An unhealthy treatment program includes:  Fasting.  Fad diets.  Supplements and drugs. These choices do not succeed in long-term weight control. HOME CARE  INSTRUCTIONS  Exercise and perform physical activity as directed by your health care provider. To increase physical activity, try the following:  Use stairs instead of elevators.  Park farther away from store entrances.  Garden, bike, or walk instead of watching television or using the computer.  Eat healthy, low-calorie foods and drinks on a regular basis. Eat more fruits and vegetables. Use low-calorie cookbooks or take healthy cooking classes.  Limit fast food, sweets, and processed snack foods.  Eat smaller portions.  Keep a daily journal of everything you eat. There are many free websites to help you with this. It may be helpful to measure your foods so you can determine if you are eating the correct portion sizes.  Avoid drinking alcohol. Drink more water and drinks without calories.  Take vitamins and supplements only as recommended by your health care provider.  Weight-loss support groups, registered dietitians, counselors, and stress reduction education can also be very helpful. SEEK IMMEDIATE MEDICAL CARE IF:  You have chest pain or tightness.  You have trouble breathing or feel short of breath.  You have weakness or leg numbness.  You feel confused or have trouble talking.  You have sudden changes in your vision.   This information is not intended to replace advice given to you by your health care provider. Make sure you discuss any questions you have with your health care provider.   Document Released: 02/06/2004 Document Revised: 01/19/2014 Document Reviewed: 02/04/2011 Elsevier Interactive Patient Education 2016 [employer]  

## 2015-09-02 NOTE — Assessment & Plan Note (Signed)
Check labs 

## 2015-09-02 NOTE — Assessment & Plan Note (Signed)
Xray Consider ortho if no improvement
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
## 2015-09-02 NOTE — Assessment & Plan Note (Signed)
Discussed diet and exercise 

## 2015-09-02 NOTE — Progress Notes (Signed)
Pre visit review using our clinic review tool, if applicable. No additional management support is needed unless otherwise documented below in the visit note. 

## 2015-09-11 DIAGNOSIS — M6259 Muscle wasting and atrophy, not elsewhere classified, multiple sites: Secondary | ICD-10-CM | POA: Diagnosis not present

## 2015-09-11 DIAGNOSIS — M5136 Other intervertebral disc degeneration, lumbar region: Secondary | ICD-10-CM | POA: Diagnosis not present

## 2015-09-11 DIAGNOSIS — G894 Chronic pain syndrome: Secondary | ICD-10-CM | POA: Diagnosis not present

## 2015-09-11 DIAGNOSIS — M545 Low back pain: Secondary | ICD-10-CM | POA: Diagnosis not present

## 2015-09-13 ENCOUNTER — Telehealth: Payer: Self-pay

## 2015-09-13 NOTE — Telephone Encounter (Signed)
Reviewed labs with patient and she verbalized understanding that they are ok, except for her elevated glucose and triglycerides. She has agreed to recheck in 6 mos.     KP

## 2015-09-18 ENCOUNTER — Other Ambulatory Visit: Payer: Self-pay | Admitting: Family Medicine

## 2015-09-18 DIAGNOSIS — G894 Chronic pain syndrome: Secondary | ICD-10-CM | POA: Diagnosis not present

## 2015-09-18 DIAGNOSIS — G4700 Insomnia, unspecified: Secondary | ICD-10-CM

## 2015-09-18 DIAGNOSIS — M545 Low back pain: Secondary | ICD-10-CM | POA: Diagnosis not present

## 2015-09-18 DIAGNOSIS — M6259 Muscle wasting and atrophy, not elsewhere classified, multiple sites: Secondary | ICD-10-CM | POA: Diagnosis not present

## 2015-09-18 DIAGNOSIS — M5136 Other intervertebral disc degeneration, lumbar region: Secondary | ICD-10-CM | POA: Diagnosis not present

## 2015-09-18 MED ORDER — ZOLPIDEM TARTRATE 5 MG PO TABS: 5.0000 mg | ORAL_TABLET | Freq: Every evening | ORAL | 0 refills | Status: DC | PRN

## 2015-09-18 MED ORDER — FENOFIBRATE 160 MG PO TABS: 160.0000 mg | ORAL_TABLET | Freq: Every day | ORAL | 1 refills | Status: DC

## 2015-09-18 NOTE — Telephone Encounter (Signed)
Last seen 09/02/15 and filled 07/01/15 #90    Please advise     KP

## 2015-09-19 DIAGNOSIS — M545 Low back pain: Secondary | ICD-10-CM | POA: Diagnosis not present

## 2015-09-19 DIAGNOSIS — G894 Chronic pain syndrome: Secondary | ICD-10-CM | POA: Diagnosis not present

## 2015-09-19 DIAGNOSIS — M5136 Other intervertebral disc degeneration, lumbar region: Secondary | ICD-10-CM | POA: Diagnosis not present

## 2015-09-19 DIAGNOSIS — M6259 Muscle wasting and atrophy, not elsewhere classified, multiple sites: Secondary | ICD-10-CM | POA: Diagnosis not present

## 2015-09-19 MED ORDER — ZOLPIDEM TARTRATE 5 MG PO TABS: 5.0000 mg | ORAL_TABLET | Freq: Every evening | ORAL | 0 refills | Status: DC | PRN

## 2015-09-19 NOTE — Addendum Note (Signed)
Addended by: Ewing Schlein on: 09/19/2015 08:58 AM   Modules accepted: Orders

## 2015-09-26 DIAGNOSIS — J029 Acute pharyngitis, unspecified: Secondary | ICD-10-CM | POA: Diagnosis not present

## 2015-09-26 DIAGNOSIS — R05 Cough: Secondary | ICD-10-CM | POA: Diagnosis not present

## 2015-09-26 DIAGNOSIS — J0100 Acute maxillary sinusitis, unspecified: Secondary | ICD-10-CM | POA: Diagnosis not present

## 2015-10-14 DIAGNOSIS — M5136 Other intervertebral disc degeneration, lumbar region: Secondary | ICD-10-CM | POA: Diagnosis not present

## 2015-10-14 DIAGNOSIS — M545 Low back pain: Secondary | ICD-10-CM | POA: Diagnosis not present

## 2015-10-14 DIAGNOSIS — M6259 Muscle wasting and atrophy, not elsewhere classified, multiple sites: Secondary | ICD-10-CM | POA: Diagnosis not present

## 2015-10-14 DIAGNOSIS — G894 Chronic pain syndrome: Secondary | ICD-10-CM | POA: Diagnosis not present

## 2015-10-15 ENCOUNTER — Other Ambulatory Visit: Payer: Self-pay | Admitting: Family Medicine

## 2015-10-15 DIAGNOSIS — F411 Generalized anxiety disorder: Secondary | ICD-10-CM

## 2015-10-15 MED ORDER — CLONAZEPAM 1 MG PO TABS: 1.0000 mg | ORAL_TABLET | Freq: Every day | ORAL | 0 refills | Status: DC

## 2015-10-15 NOTE — Telephone Encounter (Signed)
Last seen 09/02/2015 and filled 07/22/15 #90   Please advise    KP

## 2015-10-16 DIAGNOSIS — G894 Chronic pain syndrome: Secondary | ICD-10-CM | POA: Diagnosis not present

## 2015-10-16 DIAGNOSIS — M6259 Muscle wasting and atrophy, not elsewhere classified, multiple sites: Secondary | ICD-10-CM | POA: Diagnosis not present

## 2015-10-16 DIAGNOSIS — M545 Low back pain: Secondary | ICD-10-CM | POA: Diagnosis not present

## 2015-10-16 DIAGNOSIS — M5136 Other intervertebral disc degeneration, lumbar region: Secondary | ICD-10-CM | POA: Diagnosis not present

## 2015-10-22 DIAGNOSIS — M6259 Muscle wasting and atrophy, not elsewhere classified, multiple sites: Secondary | ICD-10-CM | POA: Diagnosis not present

## 2015-10-22 DIAGNOSIS — M545 Low back pain: Secondary | ICD-10-CM | POA: Diagnosis not present

## 2015-10-22 DIAGNOSIS — G894 Chronic pain syndrome: Secondary | ICD-10-CM | POA: Diagnosis not present

## 2015-10-22 DIAGNOSIS — M5136 Other intervertebral disc degeneration, lumbar region: Secondary | ICD-10-CM | POA: Diagnosis not present

## 2015-10-29 DIAGNOSIS — M6259 Muscle wasting and atrophy, not elsewhere classified, multiple sites: Secondary | ICD-10-CM | POA: Diagnosis not present

## 2015-10-29 DIAGNOSIS — M5136 Other intervertebral disc degeneration, lumbar region: Secondary | ICD-10-CM | POA: Diagnosis not present

## 2015-10-29 DIAGNOSIS — M545 Low back pain: Secondary | ICD-10-CM | POA: Diagnosis not present

## 2015-10-29 DIAGNOSIS — G894 Chronic pain syndrome: Secondary | ICD-10-CM | POA: Diagnosis not present

## 2015-10-31 DIAGNOSIS — M545 Low back pain: Secondary | ICD-10-CM | POA: Diagnosis not present

## 2015-10-31 DIAGNOSIS — M6259 Muscle wasting and atrophy, not elsewhere classified, multiple sites: Secondary | ICD-10-CM | POA: Diagnosis not present

## 2015-10-31 DIAGNOSIS — M5136 Other intervertebral disc degeneration, lumbar region: Secondary | ICD-10-CM | POA: Diagnosis not present

## 2015-10-31 DIAGNOSIS — G894 Chronic pain syndrome: Secondary | ICD-10-CM | POA: Diagnosis not present

## 2015-11-05 ENCOUNTER — Encounter: Payer: Self-pay | Admitting: Family Medicine

## 2015-11-05 ENCOUNTER — Telehealth: Payer: Self-pay | Admitting: Family Medicine

## 2015-11-05 ENCOUNTER — Ambulatory Visit (INDEPENDENT_AMBULATORY_CARE_PROVIDER_SITE_OTHER): Payer: Commercial Managed Care - HMO | Admitting: Family Medicine

## 2015-11-05 ENCOUNTER — Other Ambulatory Visit (HOSPITAL_COMMUNITY)
Admission: RE | Admit: 2015-11-05 | Discharge: 2015-11-05 | Disposition: A | Discharge status: Home / Self Care | Payer: Commercial Managed Care - HMO | Source: Ambulatory Visit | Attending: Family Medicine | Admitting: Family Medicine

## 2015-11-05 VITALS — BP 114/66 | HR 81 | Temp 98.9°F | Ht <= 58 in | Wt 149.6 lb

## 2015-11-05 DIAGNOSIS — E785 Hyperlipidemia, unspecified: Secondary | ICD-10-CM

## 2015-11-05 DIAGNOSIS — Z1231 Encounter for screening mammogram for malignant neoplasm of breast: Secondary | ICD-10-CM | POA: Diagnosis not present

## 2015-11-05 DIAGNOSIS — Z1239 Encounter for other screening for malignant neoplasm of breast: Secondary | ICD-10-CM

## 2015-11-05 DIAGNOSIS — Z01419 Encounter for gynecological examination (general) (routine) without abnormal findings: Secondary | ICD-10-CM | POA: Insufficient documentation

## 2015-11-05 DIAGNOSIS — E2839 Other primary ovarian failure: Secondary | ICD-10-CM | POA: Diagnosis not present

## 2015-11-05 DIAGNOSIS — Z0000 Encounter for general adult medical examination without abnormal findings: Secondary | ICD-10-CM | POA: Diagnosis not present

## 2015-11-05 DIAGNOSIS — Z113 Encounter for screening for infections with a predominantly sexual mode of transmission: Secondary | ICD-10-CM | POA: Insufficient documentation

## 2015-11-05 DIAGNOSIS — Z23 Encounter for immunization: Secondary | ICD-10-CM

## 2015-11-05 DIAGNOSIS — Z1272 Encounter for screening for malignant neoplasm of vagina: Secondary | ICD-10-CM | POA: Diagnosis not present

## 2015-11-05 DIAGNOSIS — Z7251 High risk heterosexual behavior: Secondary | ICD-10-CM | POA: Diagnosis not present

## 2015-11-05 LAB — POCT URINALYSIS DIPSTICK
BILIRUBIN UA: NEGATIVE
Blood, UA: NEGATIVE
GLUCOSE UA: NEGATIVE
KETONES UA: NEGATIVE
LEUKOCYTES UA: NEGATIVE
NITRITE UA: NEGATIVE
Protein, UA: NEGATIVE
Spec Grav, UA: 1.015
Urobilinogen, UA: 0.2
pH, UA: 6

## 2015-11-05 NOTE — Telephone Encounter (Signed)
Pt says that she and the provider discussed her restless legs. She says that PCP mentioned sending her something to the pharmacy, system isn't showing.   Pharmacy: [place], [address]

## 2015-11-05 NOTE — Progress Notes (Signed)
Pre visit review using our clinic review tool, if applicable. No additional management support is needed unless otherwise documented below in the visit note. 

## 2015-11-05 NOTE — Telephone Encounter (Signed)
requip 0.5 mg 1 po q pm  #30  , can increase to 2 at night after 3 days ----- 3 days after that can increase to 1 mg-- let us know which dose helps and we can call that in

## 2015-11-05 NOTE — Telephone Encounter (Signed)
Please advise      KP 

## 2015-11-05 NOTE — Patient Instructions (Signed)
Preventive Care for Adults, Female A healthy lifestyle and preventive care can promote health and wellness. Preventive health guidelines for women include the following key practices.  A routine yearly physical is a good way to check with your health care provider about your health and preventive screening. It is a chance to share any concerns and updates on your health and to receive a thorough exam.  Visit your dentist for a routine exam and preventive care every 6 months. Brush your teeth twice a day and floss once a day. Good oral hygiene prevents tooth decay and gum disease.  The frequency of eye exams is based on your age, health, family medical history, use of contact lenses, and other factors. Follow your health care provider's recommendations for frequency of eye exams.  Eat a healthy diet. Foods like vegetables, fruits, whole grains, low-fat dairy products, and lean protein foods contain the nutrients you need without too many calories. Decrease your intake of foods high in solid fats, added sugars, and salt. Eat the right amount of calories for you.Get information about a proper diet from your health care provider, if necessary.  Regular physical exercise is one of the most important things you can do for your health. Most adults should get at least 150 minutes of moderate-intensity exercise (any activity that increases your heart rate and causes you to sweat) each week. In addition, most adults need muscle-strengthening exercises on 2 or more days a week.  Maintain a healthy weight. The body mass index (BMI) is a screening tool to identify possible weight problems. It provides an estimate of body fat based on height and weight. Your health care provider can find your BMI and can help you achieve or maintain a healthy weight.For adults 20 years and older:  A BMI below 18.5 is considered underweight.  A BMI of 18.5 to 24.9 is normal.  A BMI of 25 to 29.9 is considered overweight.  A  BMI of 30 and above is considered obese.  Maintain normal blood lipids and cholesterol levels by exercising and minimizing your intake of saturated fat. Eat a balanced diet with plenty of fruit and vegetables. Blood tests for lipids and cholesterol should begin at age 45 and be repeated every 5 years. If your lipid or cholesterol levels are high, you are over 50, or you are at high risk for heart disease, you may need your cholesterol levels checked more frequently.Ongoing high lipid and cholesterol levels should be treated with medicines if diet and exercise are not working.  If you smoke, find out from your health care provider how to quit. If you do not use tobacco, do not start.  Lung cancer screening is recommended for adults aged 45-80 years who are at high risk for developing lung cancer because of a history of smoking. A yearly low-dose CT scan of the lungs is recommended for people who have at least a 30-pack-year history of smoking and are a current smoker or have quit within the past 15 years. A pack year of smoking is smoking an average of 1 pack of cigarettes a day for 1 year (for example: 1 pack a day for 30 years or 2 packs a day for 15 years). Yearly screening should continue until the smoker has stopped smoking for at least 15 years. Yearly screening should be stopped for people who develop a health problem that would prevent them from having lung cancer treatment.  If you are pregnant, do not drink alcohol. If you are  breastfeeding, be very cautious about drinking alcohol. If you are not pregnant and choose to drink alcohol, do not have more than 1 drink per day. One drink is considered to be 12 ounces (355 mL) of beer, 5 ounces (148 mL) of wine, or 1.5 ounces (44 mL) of liquor.  Avoid use of street drugs. Do not share needles with anyone. Ask for help if you need support or instructions about stopping the use of drugs.  High blood pressure causes heart disease and increases the risk  of stroke. Your blood pressure should be checked at least every 1 to 2 years. Ongoing high blood pressure should be treated with medicines if weight loss and exercise do not work.  If you are 55-79 years old, ask your health care provider if you should take aspirin to prevent strokes.  Diabetes screening is done by taking a blood sample to check your blood glucose level after you have not eaten for a certain period of time (fasting). If you are not overweight and you do not have risk factors for diabetes, you should be screened once every 3 years starting at age 45. If you are overweight or obese and you are 40-70 years of age, you should be screened for diabetes every year as part of your cardiovascular risk assessment.  Breast cancer screening is essential preventive care for women. You should practice "breast self-awareness." This means understanding the normal appearance and feel of your breasts and may include breast self-examination. Any changes detected, no matter how small, should be reported to a health care provider. Women in their 20s and 30s should have a clinical breast exam (CBE) by a health care provider as part of a regular health exam every 1 to 3 years. After age 40, women should have a CBE every year. Starting at age 40, women should consider having a mammogram (breast X-ray test) every year. Women who have a family history of breast cancer should talk to their health care provider about genetic screening. Women at a high risk of breast cancer should talk to their health care providers about having an MRI and a mammogram every year.  Breast cancer gene (BRCA)-related cancer risk assessment is recommended for women who have family members with BRCA-related cancers. BRCA-related cancers include breast, ovarian, tubal, and peritoneal cancers. Having family members with these cancers may be associated with an increased risk for harmful changes (mutations) in the breast cancer genes BRCA1 and  BRCA2. Results of the assessment will determine the need for genetic counseling and BRCA1 and BRCA2 testing.  Your health care provider may recommend that you be screened regularly for cancer of the pelvic organs (ovaries, uterus, and vagina). This screening involves a pelvic examination, including checking for microscopic changes to the surface of your cervix (Pap test). You may be encouraged to have this screening done every 3 years, beginning at age 21.  For women ages 30-65, health care providers may recommend pelvic exams and Pap testing every 3 years, or they may recommend the Pap and pelvic exam, combined with testing for human papilloma virus (HPV), every 5 years. Some types of HPV increase your risk of cervical cancer. Testing for HPV may also be done on women of any age with unclear Pap test results.  Other health care providers may not recommend any screening for nonpregnant women who are considered low risk for pelvic cancer and who do not have symptoms. Ask your health care provider if a screening pelvic exam is right for   you.  If you have had past treatment for cervical cancer or a condition that could lead to cancer, you need Pap tests and screening for cancer for at least 20 years after your treatment. If Pap tests have been discontinued, your risk factors (such as having a new sexual partner) need to be reassessed to determine if screening should resume. Some women have medical problems that increase the chance of getting cervical cancer. In these cases, your health care provider may recommend more frequent screening and Pap tests.  Colorectal cancer can be detected and often prevented. Most routine colorectal cancer screening begins at the age of 50 years and continues through age 75 years. However, your health care provider may recommend screening at an earlier age if you have risk factors for colon cancer. On a yearly basis, your health care provider may provide home test kits to check  for hidden blood in the stool. Use of a small camera at the end of a tube, to directly examine the colon (sigmoidoscopy or colonoscopy), can detect the earliest forms of colorectal cancer. Talk to your health care provider about this at age 50, when routine screening begins. Direct exam of the colon should be repeated every 5-10 years through age 75 years, unless early forms of precancerous polyps or small growths are found.  People who are at an increased risk for hepatitis B should be screened for this virus. You are considered at high risk for hepatitis B if:  You were born in a country where hepatitis B occurs often. Talk with your health care provider about which countries are considered high risk.  Your parents were born in a high-risk country and you have not received a shot to protect against hepatitis B (hepatitis B vaccine).  You have HIV or AIDS.  You use needles to inject street drugs.  You live with, or have sex with, someone who has hepatitis B.  You get hemodialysis treatment.  You take certain medicines for conditions like cancer, organ transplantation, and autoimmune conditions.  Hepatitis C blood testing is recommended for all people born from 1945 through 1965 and any individual with known risks for hepatitis C.  Practice safe sex. Use condoms and avoid high-risk sexual practices to reduce the spread of sexually transmitted infections (STIs). STIs include gonorrhea, chlamydia, syphilis, trichomonas, herpes, HPV, and human immunodeficiency virus (HIV). Herpes, HIV, and HPV are viral illnesses that have no cure. They can result in disability, cancer, and death.  You should be screened for sexually transmitted illnesses (STIs) including gonorrhea and chlamydia if:  You are sexually active and are younger than 24 years.  You are older than 24 years and your health care provider tells you that you are at risk for this type of infection.  Your sexual activity has changed  since you were last screened and you are at an increased risk for chlamydia or gonorrhea. Ask your health care provider if you are at risk.  If you are at risk of being infected with HIV, it is recommended that you take a prescription medicine daily to prevent HIV infection. This is called preexposure prophylaxis (PrEP). You are considered at risk if:  You are sexually active and do not regularly use condoms or know the HIV status of your partner(s).  You take drugs by injection.  You are sexually active with a partner who has HIV.  Talk with your health care provider about whether you are at high risk of being infected with HIV. If   you choose to begin PrEP, you should first be tested for HIV. You should then be tested every 3 months for as long as you are taking PrEP.  Osteoporosis is a disease in which the bones lose minerals and strength with aging. This can result in serious bone fractures or breaks. The risk of osteoporosis can be identified using a bone density scan. Women ages 67 years and over and women at risk for fractures or osteoporosis should discuss screening with their health care providers. Ask your health care provider whether you should take a calcium supplement or vitamin D to reduce the rate of osteoporosis.  Menopause can be associated with physical symptoms and risks. Hormone replacement therapy is available to decrease symptoms and risks. You should talk to your health care provider about whether hormone replacement therapy is right for you.  Use sunscreen. Apply sunscreen liberally and repeatedly throughout the day. You should seek shade when your shadow is shorter than you. Protect yourself by wearing long sleeves, pants, a wide-brimmed hat, and sunglasses year round, whenever you are outdoors.  Once a month, do a whole body skin exam, using a mirror to look at the skin on your back. Tell your health care provider of new moles, moles that have irregular borders, moles that  are larger than a pencil eraser, or moles that have changed in shape or color.  Stay current with required vaccines (immunizations).  Influenza vaccine. All adults should be immunized every year.  Tetanus, diphtheria, and acellular pertussis (Td, Tdap) vaccine. Pregnant women should receive 1 dose of Tdap vaccine during each pregnancy. The dose should be obtained regardless of the length of time since the last dose. Immunization is preferred during the 27th-36th week of gestation. An adult who has not previously received Tdap or who does not know her vaccine status should receive 1 dose of Tdap. This initial dose should be followed by tetanus and diphtheria toxoids (Td) booster doses every 10 years. Adults with an unknown or incomplete history of completing a 3-dose immunization series with Td-containing vaccines should begin or complete a primary immunization series including a Tdap dose. Adults should receive a Td booster every 10 years.  Varicella vaccine. An adult without evidence of immunity to varicella should receive 2 doses or a second dose if she has previously received 1 dose. Pregnant females who do not have evidence of immunity should receive the first dose after pregnancy. This first dose should be obtained before leaving the health care facility. The second dose should be obtained 4-8 weeks after the first dose.  Human papillomavirus (HPV) vaccine. Females aged 13-26 years who have not received the vaccine previously should obtain the 3-dose series. The vaccine is not recommended for use in pregnant females. However, pregnancy testing is not needed before receiving a dose. If a female is found to be pregnant after receiving a dose, no treatment is needed. In that case, the remaining doses should be delayed until after the pregnancy. Immunization is recommended for any person with an immunocompromised condition through the age of 61 years if she did not get any or all doses earlier. During the  3-dose series, the second dose should be obtained 4-8 weeks after the first dose. The third dose should be obtained 24 weeks after the first dose and 16 weeks after the second dose.  Zoster vaccine. One dose is recommended for adults aged 30 years or older unless certain conditions are present.  Measles, mumps, and rubella (MMR) vaccine. Adults born  before 1957 generally are considered immune to measles and mumps. Adults born in 1957 or later should have 1 or more doses of MMR vaccine unless there is a contraindication to the vaccine or there is laboratory evidence of immunity to each of the three diseases. A routine second dose of MMR vaccine should be obtained at least 28 days after the first dose for students attending postsecondary schools, health care workers, or international travelers. People who received inactivated measles vaccine or an unknown type of measles vaccine during 1963-1967 should receive 2 doses of MMR vaccine. People who received inactivated mumps vaccine or an unknown type of mumps vaccine before 1979 and are at high risk for mumps infection should consider immunization with 2 doses of MMR vaccine. For females of childbearing age, rubella immunity should be determined. If there is no evidence of immunity, females who are not pregnant should be vaccinated. If there is no evidence of immunity, females who are pregnant should delay immunization until after pregnancy. Unvaccinated health care workers born before 1957 who lack laboratory evidence of measles, mumps, or rubella immunity or laboratory confirmation of disease should consider measles and mumps immunization with 2 doses of MMR vaccine or rubella immunization with 1 dose of MMR vaccine.  Pneumococcal 13-valent conjugate (PCV13) vaccine. When indicated, a person who is uncertain of his immunization history and has no record of immunization should receive the PCV13 vaccine. All adults 65 years of age and older should receive this  vaccine. An adult aged 19 years or older who has certain medical conditions and has not been previously immunized should receive 1 dose of PCV13 vaccine. This PCV13 should be followed with a dose of pneumococcal polysaccharide (PPSV23) vaccine. Adults who are at high risk for pneumococcal disease should obtain the PPSV23 vaccine at least 8 weeks after the dose of PCV13 vaccine. Adults older than 65 years of age who have normal immune system function should obtain the PPSV23 vaccine dose at least 1 year after the dose of PCV13 vaccine.  Pneumococcal polysaccharide (PPSV23) vaccine. When PCV13 is also indicated, PCV13 should be obtained first. All adults aged 65 years and older should be immunized. An adult younger than age 65 years who has certain medical conditions should be immunized. Any person who resides in a nursing home or long-term care facility should be immunized. An adult smoker should be immunized. People with an immunocompromised condition and certain other conditions should receive both PCV13 and PPSV23 vaccines. People with human immunodeficiency virus (HIV) infection should be immunized as soon as possible after diagnosis. Immunization during chemotherapy or radiation therapy should be avoided. Routine use of PPSV23 vaccine is not recommended for American Indians, [place] Natives, or people younger than 65 years unless there are medical conditions that require PPSV23 vaccine. When indicated, people who have unknown immunization and have no record of immunization should receive PPSV23 vaccine. One-time revaccination 5 years after the first dose of PPSV23 is recommended for people aged 19-64 years who have chronic kidney failure, nephrotic syndrome, asplenia, or immunocompromised conditions. People who received 1-2 doses of PPSV23 before age 65 years should receive another dose of PPSV23 vaccine at age 65 years or later if at least 5 years have passed since the previous dose. Doses of PPSV23 are not  needed for people immunized with PPSV23 at or after age 65 years.  Meningococcal vaccine. Adults with asplenia or persistent complement component deficiencies should receive 2 doses of quadrivalent meningococcal conjugate (MenACWY-D) vaccine. The doses should be obtained   at least 2 months apart. Microbiologists working with certain meningococcal bacteria, [place] recruits, people at risk during an outbreak, and people who travel to or live in countries with a high rate of meningitis should be immunized. A first-year college student up through age 34 years who is living in a residence hall should receive a dose if she did not receive a dose on or after her 16th birthday. Adults who have certain high-risk conditions should receive one or more doses of vaccine.  Hepatitis A vaccine. Adults who wish to be protected from this disease, have certain high-risk conditions, work with hepatitis A-infected animals, work in hepatitis A research labs, or travel to or work in countries with a high rate of hepatitis A should be immunized. Adults who were previously unvaccinated and who anticipate close contact with an international adoptee during the first 60 days after arrival in the [place] from a country with a high rate of hepatitis A should be immunized.  Hepatitis B vaccine. Adults who wish to be protected from this disease, have certain high-risk conditions, may be exposed to blood or other infectious body fluids, are household contacts or sex partners of hepatitis B positive people, are clients or workers in certain care facilities, or travel to or work in countries with a high rate of hepatitis B should be immunized.  Haemophilus influenzae type b (Hib) vaccine. A previously unvaccinated person with asplenia or sickle cell disease or having a scheduled splenectomy should receive 1 dose of Hib vaccine. Regardless of previous immunization, a recipient of a hematopoietic stem cell transplant should receive a  3-dose series 6-12 months after her successful transplant. Hib vaccine is not recommended for adults with HIV infection. Preventive Services / Frequency Ages 35 to 4 years  Blood pressure check.** / Every 3-5 years.  Lipid and cholesterol check.** / Every 5 years beginning at age 60.  Clinical breast exam.** / Every 3 years for women in their 71s and 10s.  BRCA-related cancer risk assessment.** / For women who have family members with a BRCA-related cancer (breast, ovarian, tubal, or peritoneal cancers).  Pap test.** / Every 2 years from ages 76 through 26. Every 3 years starting at age 61 through age 76 or 93 with a history of 3 consecutive normal Pap tests.  HPV screening.** / Every 3 years from ages 37 through ages 60 to 51 with a history of 3 consecutive normal Pap tests.  Hepatitis C blood test.** / For any individual with known risks for hepatitis C.  Skin self-exam. / Monthly.  Influenza vaccine. / Every year.  Tetanus, diphtheria, and acellular pertussis (Tdap, Td) vaccine.** / Consult your health care provider. Pregnant women should receive 1 dose of Tdap vaccine during each pregnancy. 1 dose of Td every 10 years.  Varicella vaccine.** / Consult your health care provider. Pregnant females who do not have evidence of immunity should receive the first dose after pregnancy.  HPV vaccine. / 3 doses over 6 months, if 93 and younger. The vaccine is not recommended for use in pregnant females. However, pregnancy testing is not needed before receiving a dose.  Measles, mumps, rubella (MMR) vaccine.** / You need at least 1 dose of MMR if you were born in 1957 or later. You may also need a 2nd dose. For females of childbearing age, rubella immunity should be determined. If there is no evidence of immunity, females who are not pregnant should be vaccinated. If there is no evidence of immunity, females who are  pregnant should delay immunization until after pregnancy.  Pneumococcal  13-valent conjugate (PCV13) vaccine.** / Consult your health care provider.  Pneumococcal polysaccharide (PPSV23) vaccine.** / 1 to 2 doses if you smoke cigarettes or if you have certain conditions.  Meningococcal vaccine.** / 1 dose if you are age 68 to 8 years and a Market researcher living in a residence hall, or have one of several medical conditions, you need to get vaccinated against meningococcal disease. You may also need additional booster doses.  Hepatitis A vaccine.** / Consult your health care provider.  Hepatitis B vaccine.** / Consult your health care provider.  Haemophilus influenzae type b (Hib) vaccine.** / Consult your health care provider. Ages 7 to 53 years  Blood pressure check.** / Every year.  Lipid and cholesterol check.** / Every 5 years beginning at age 25 years.  Lung cancer screening. / Every year if you are aged 11-80 years and have a 30-pack-year history of smoking and currently smoke or have quit within the past 15 years. Yearly screening is stopped once you have quit smoking for at least 15 years or develop a health problem that would prevent you from having lung cancer treatment.  Clinical breast exam.** / Every year after age 48 years.  BRCA-related cancer risk assessment.** / For women who have family members with a BRCA-related cancer (breast, ovarian, tubal, or peritoneal cancers).  Mammogram.** / Every year beginning at age 41 years and continuing for as long as you are in good health. Consult with your health care provider.  Pap test.** / Every 3 years starting at age 65 years through age 37 or 70 years with a history of 3 consecutive normal Pap tests.  HPV screening.** / Every 3 years from ages 72 years through ages 60 to 40 years with a history of 3 consecutive normal Pap tests.  Fecal occult blood test (FOBT) of stool. / Every year beginning at age 21 years and continuing until age 5 years. You may not need to do this test if you get  a colonoscopy every 10 years.  Flexible sigmoidoscopy or colonoscopy.** / Every 5 years for a flexible sigmoidoscopy or every 10 years for a colonoscopy beginning at age 35 years and continuing until age 48 years.  Hepatitis C blood test.** / For all people born from [birth year] through 1965 and any individual with known risks for hepatitis C.  Skin self-exam. / Monthly.  Influenza vaccine. / Every year.  Tetanus, diphtheria, and acellular pertussis (Tdap/Td) vaccine.** / Consult your health care provider. Pregnant women should receive 1 dose of Tdap vaccine during each pregnancy. 1 dose of Td every 10 years.  Varicella vaccine.** / Consult your health care provider. Pregnant females who do not have evidence of immunity should receive the first dose after pregnancy.  Zoster vaccine.** / 1 dose for adults aged 30 years or older.  Measles, mumps, rubella (MMR) vaccine.** / You need at least 1 dose of MMR if you were born in 1957 or later. You may also need a second dose. For females of childbearing age, rubella immunity should be determined. If there is no evidence of immunity, females who are not pregnant should be vaccinated. If there is no evidence of immunity, females who are pregnant should delay immunization until after pregnancy.  Pneumococcal 13-valent conjugate (PCV13) vaccine.** / Consult your health care provider.  Pneumococcal polysaccharide (PPSV23) vaccine.** / 1 to 2 doses if you smoke cigarettes or if you have certain conditions.  Meningococcal vaccine.** /  Consult your health care provider.  Hepatitis A vaccine.** / Consult your health care provider.  Hepatitis B vaccine.** / Consult your health care provider.  Haemophilus influenzae type b (Hib) vaccine.** / Consult your health care provider. Ages 64 years and over  Blood pressure check.** / Every year.  Lipid and cholesterol check.** / Every 5 years beginning at age 23 years.  Lung cancer screening. / Every year if you  are aged 16-80 years and have a 30-pack-year history of smoking and currently smoke or have quit within the past 15 years. Yearly screening is stopped once you have quit smoking for at least 15 years or develop a health problem that would prevent you from having lung cancer treatment.  Clinical breast exam.** / Every year after age 74 years.  BRCA-related cancer risk assessment.** / For women who have family members with a BRCA-related cancer (breast, ovarian, tubal, or peritoneal cancers).  Mammogram.** / Every year beginning at age 44 years and continuing for as long as you are in good health. Consult with your health care provider.  Pap test.** / Every 3 years starting at age 58 years through age 22 or 39 years with 3 consecutive normal Pap tests. Testing can be stopped between 65 and 70 years with 3 consecutive normal Pap tests and no abnormal Pap or HPV tests in the past 10 years.  HPV screening.** / Every 3 years from ages 64 years through ages 70 or 61 years with a history of 3 consecutive normal Pap tests. Testing can be stopped between 65 and 70 years with 3 consecutive normal Pap tests and no abnormal Pap or HPV tests in the past 10 years.  Fecal occult blood test (FOBT) of stool. / Every year beginning at age 40 years and continuing until age 27 years. You may not need to do this test if you get a colonoscopy every 10 years.  Flexible sigmoidoscopy or colonoscopy.** / Every 5 years for a flexible sigmoidoscopy or every 10 years for a colonoscopy beginning at age 7 years and continuing until age 32 years.  Hepatitis C blood test.** / For all people born from [birth year] through 1965 and any individual with known risks for hepatitis C.  Osteoporosis screening.** / A one-time screening for women ages 30 years and over and women at risk for fractures or osteoporosis.  Skin self-exam. / Monthly.  Influenza vaccine. / Every year.  Tetanus, diphtheria, and acellular pertussis (Tdap/Td)  vaccine.** / 1 dose of Td every 10 years.  Varicella vaccine.** / Consult your health care provider.  Zoster vaccine.** / 1 dose for adults aged 35 years or older.  Pneumococcal 13-valent conjugate (PCV13) vaccine.** / Consult your health care provider.  Pneumococcal polysaccharide (PPSV23) vaccine.** / 1 dose for all adults aged 46 years and older.  Meningococcal vaccine.** / Consult your health care provider.  Hepatitis A vaccine.** / Consult your health care provider.  Hepatitis B vaccine.** / Consult your health care provider.  Haemophilus influenzae type b (Hib) vaccine.** / Consult your health care provider. ** Family history and personal history of risk and conditions may change your health care provider's recommendations.   This information is not intended to replace advice given to you by your health care provider. Make sure you discuss any questions you have with your health care provider.   Document Released: 02/24/2001 Document Revised: 01/19/2014 Document Reviewed: 05/26/2010 Elsevier Interactive Patient Education [employer].
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
## 2015-11-06 DIAGNOSIS — E785 Hyperlipidemia, unspecified: Secondary | ICD-10-CM | POA: Diagnosis not present

## 2015-11-06 DIAGNOSIS — Z23 Encounter for immunization: Secondary | ICD-10-CM | POA: Diagnosis not present

## 2015-11-06 DIAGNOSIS — Z0000 Encounter for general adult medical examination without abnormal findings: Secondary | ICD-10-CM | POA: Diagnosis not present

## 2015-11-06 DIAGNOSIS — Z1272 Encounter for screening for malignant neoplasm of vagina: Secondary | ICD-10-CM | POA: Diagnosis not present

## 2015-11-06 DIAGNOSIS — Z1231 Encounter for screening mammogram for malignant neoplasm of breast: Secondary | ICD-10-CM | POA: Diagnosis not present

## 2015-11-06 DIAGNOSIS — Z7251 High risk heterosexual behavior: Secondary | ICD-10-CM | POA: Diagnosis not present

## 2015-11-06 DIAGNOSIS — E2839 Other primary ovarian failure: Secondary | ICD-10-CM | POA: Diagnosis not present

## 2015-11-06 LAB — COMPREHENSIVE METABOLIC PANEL
ALT: 24 U/L (ref 0–35)
AST: 28 U/L (ref 0–37)
Albumin: 4.9 g/dL (ref 3.5–5.2)
Alkaline Phosphatase: 49 U/L (ref 39–117)
BILIRUBIN TOTAL: 0.4 mg/dL (ref 0.2–1.2)
BUN: 19 mg/dL (ref 6–23)
CALCIUM: 10.4 mg/dL (ref 8.4–10.5)
CHLORIDE: 105 meq/L (ref 96–112)
CO2: 25 meq/L (ref 19–32)
Creatinine, Ser: 0.96 mg/dL (ref 0.40–1.20)
GFR: 61.84 mL/min (ref >60.00)
GLUCOSE: 98 mg/dL (ref 70–99)
Potassium: 4 mEq/L (ref 3.5–5.1)
Sodium: 138 mEq/L (ref 135–145)
Total Protein: 7.7 g/dL (ref 6.0–8.3)

## 2015-11-06 LAB — LIPID PANEL
CHOL/HDL RATIO: 3
CHOLESTEROL: 171 mg/dL (ref 0–200)
HDL: 55.8 mg/dL (ref >39.00)
LDL CALC: 92 mg/dL (ref 0–99)
NonHDL: 115.45
TRIGLYCERIDES: 115 mg/dL (ref 0.0–149.0)
VLDL: 23 mg/dL (ref 0.0–40.0)

## 2015-11-06 LAB — CBC WITH DIFFERENTIAL/PLATELET
BASOS ABS: 0.1 10*3/uL (ref 0.0–0.1)
BASOS PCT: 0.6 % (ref 0.0–3.0)
EOS ABS: 0.3 10*3/uL (ref 0.0–0.7)
Eosinophils Relative: 2.5 % (ref 0.0–5.0)
HEMATOCRIT: 40.1 % (ref 36.0–46.0)
Hemoglobin: 13.3 g/dL (ref 12.0–15.0)
LYMPHS ABS: 3.4 10*3/uL (ref 0.7–4.0)
Lymphocytes Relative: 30.5 % (ref 12.0–46.0)
MCHC: 33.2 g/dL (ref 30.0–36.0)
MCV: 85.6 fl (ref 78.0–100.0)
Monocytes Absolute: 0.7 10*3/uL (ref 0.1–1.0)
Monocytes Relative: 6.7 % (ref 3.0–12.0)
NEUTROS ABS: 6.6 10*3/uL (ref 1.4–7.7)
NEUTROS PCT: 59.7 % (ref 43.0–77.0)
PLATELETS: 390 10*3/uL (ref 150.0–400.0)
RBC: 4.68 Mil/uL (ref 3.87–5.11)
RDW: 12.7 % (ref 11.5–15.5)
WBC: 11 10*3/uL — ABNORMAL HIGH (ref 4.0–10.5)

## 2015-11-06 LAB — HIV ANTIBODY (ROUTINE TESTING W REFLEX): HIV: NONREACTIVE

## 2015-11-06 MED ORDER — ROPINIROLE HCL 0.5 MG PO TABS: ORAL_TABLET | ORAL | 0 refills | Status: DC

## 2015-11-07 LAB — CYTOLOGY - PAP
CHLAMYDIA, DNA PROBE: NEGATIVE
Diagnosis: NEGATIVE
Neisseria Gonorrhea: NEGATIVE
Trichomonas: NEGATIVE

## 2015-11-08 DIAGNOSIS — M5136 Other intervertebral disc degeneration, lumbar region: Secondary | ICD-10-CM | POA: Diagnosis not present

## 2015-11-08 DIAGNOSIS — M545 Low back pain: Secondary | ICD-10-CM | POA: Diagnosis not present

## 2015-11-08 DIAGNOSIS — G894 Chronic pain syndrome: Secondary | ICD-10-CM | POA: Diagnosis not present

## 2015-11-08 DIAGNOSIS — M6259 Muscle wasting and atrophy, not elsewhere classified, multiple sites: Secondary | ICD-10-CM | POA: Diagnosis not present

## 2015-11-12 ENCOUNTER — Encounter: Payer: Self-pay | Admitting: Family Medicine

## 2015-11-12 DIAGNOSIS — M5136 Other intervertebral disc degeneration, lumbar region: Secondary | ICD-10-CM | POA: Diagnosis not present

## 2015-11-12 DIAGNOSIS — M545 Low back pain: Secondary | ICD-10-CM | POA: Diagnosis not present

## 2015-11-12 DIAGNOSIS — M6259 Muscle wasting and atrophy, not elsewhere classified, multiple sites: Secondary | ICD-10-CM | POA: Diagnosis not present

## 2015-11-12 DIAGNOSIS — G894 Chronic pain syndrome: Secondary | ICD-10-CM | POA: Diagnosis not present

## 2015-11-13 ENCOUNTER — Encounter: Payer: Self-pay | Admitting: Family Medicine

## 2015-11-13 ENCOUNTER — Other Ambulatory Visit: Payer: Self-pay

## 2015-11-13 LAB — CERVICOVAGINAL ANCILLARY ONLY
Bacterial vaginitis: NEGATIVE
CANDIDA VAGINITIS: NEGATIVE

## 2015-11-13 MED ORDER — GLUCOSE BLOOD VI STRP: ORAL_STRIP | 12 refills | Status: DC

## 2015-11-14 NOTE — Telephone Encounter (Signed)
The requip rx given was meant to see how much she needed --- (what dose works) and then we will call in dose needed Ok to send order for hep c at [facility]

## 2015-11-15 DIAGNOSIS — M6259 Muscle wasting and atrophy, not elsewhere classified, multiple sites: Secondary | ICD-10-CM | POA: Diagnosis not present

## 2015-11-15 DIAGNOSIS — M545 Low back pain: Secondary | ICD-10-CM | POA: Diagnosis not present

## 2015-11-15 DIAGNOSIS — G894 Chronic pain syndrome: Secondary | ICD-10-CM | POA: Diagnosis not present

## 2015-11-15 DIAGNOSIS — M5136 Other intervertebral disc degeneration, lumbar region: Secondary | ICD-10-CM | POA: Diagnosis not present

## 2015-11-20 DIAGNOSIS — M4310 Spondylolisthesis, site unspecified: Secondary | ICD-10-CM | POA: Diagnosis not present

## 2015-11-20 DIAGNOSIS — M47816 Spondylosis without myelopathy or radiculopathy, lumbar region: Secondary | ICD-10-CM | POA: Diagnosis not present

## 2015-11-20 DIAGNOSIS — M533 Sacrococcygeal disorders, not elsewhere classified: Secondary | ICD-10-CM | POA: Diagnosis not present

## 2015-11-20 DIAGNOSIS — M5136 Other intervertebral disc degeneration, lumbar region: Secondary | ICD-10-CM | POA: Diagnosis not present

## 2015-11-21 DIAGNOSIS — M5136 Other intervertebral disc degeneration, lumbar region: Secondary | ICD-10-CM | POA: Diagnosis not present

## 2015-11-21 DIAGNOSIS — G894 Chronic pain syndrome: Secondary | ICD-10-CM | POA: Diagnosis not present

## 2015-11-21 DIAGNOSIS — M6259 Muscle wasting and atrophy, not elsewhere classified, multiple sites: Secondary | ICD-10-CM | POA: Diagnosis not present

## 2015-11-21 DIAGNOSIS — M545 Low back pain: Secondary | ICD-10-CM | POA: Diagnosis not present

## 2015-11-26 ENCOUNTER — Encounter: Payer: Self-pay | Admitting: Family Medicine

## 2015-11-26 ENCOUNTER — Other Ambulatory Visit: Payer: Self-pay | Admitting: Family Medicine

## 2015-11-26 DIAGNOSIS — G4700 Insomnia, unspecified: Secondary | ICD-10-CM

## 2015-11-27 MED ORDER — CYCLOBENZAPRINE HCL 10 MG PO TABS: 10.0000 mg | ORAL_TABLET | Freq: Two times a day (BID) | ORAL | 0 refills | Status: DC | PRN

## 2015-11-27 MED ORDER — VALACYCLOVIR HCL 1 G PO TABS: 1000.0000 mg | ORAL_TABLET | Freq: Two times a day (BID) | ORAL | 0 refills | Status: DC

## 2015-11-27 NOTE — Telephone Encounter (Signed)
Cyclobenzaprine sent to [facility] in error. Cancelled with pharmacist, Jenny Reichmann and re-sent to [facility].

## 2015-11-28 ENCOUNTER — Other Ambulatory Visit: Payer: Self-pay

## 2015-11-28 ENCOUNTER — Encounter: Payer: Self-pay | Admitting: Family Medicine

## 2015-11-28 DIAGNOSIS — M545 Low back pain: Secondary | ICD-10-CM | POA: Diagnosis not present

## 2015-11-28 DIAGNOSIS — G894 Chronic pain syndrome: Secondary | ICD-10-CM | POA: Diagnosis not present

## 2015-11-28 DIAGNOSIS — M6259 Muscle wasting and atrophy, not elsewhere classified, multiple sites: Secondary | ICD-10-CM | POA: Diagnosis not present

## 2015-11-28 DIAGNOSIS — M5136 Other intervertebral disc degeneration, lumbar region: Secondary | ICD-10-CM | POA: Diagnosis not present

## 2015-11-28 MED ORDER — ESCITALOPRAM OXALATE 20 MG PO TABS: 20.0000 mg | ORAL_TABLET | Freq: Every day | ORAL | 1 refills | Status: DC

## 2015-11-28 MED ORDER — FENOFIBRATE 160 MG PO TABS: 160.0000 mg | ORAL_TABLET | Freq: Every day | ORAL | 1 refills | Status: DC

## 2015-12-01 ENCOUNTER — Encounter: Payer: Self-pay | Admitting: Family Medicine

## 2015-12-02 ENCOUNTER — Encounter: Payer: Self-pay | Admitting: Family Medicine

## 2015-12-02 ENCOUNTER — Other Ambulatory Visit: Payer: Self-pay

## 2015-12-02 ENCOUNTER — Other Ambulatory Visit: Payer: Self-pay | Admitting: Family Medicine

## 2015-12-02 MED ORDER — ROPINIROLE HCL 0.5 MG PO TABS: 0.5000 mg | ORAL_TABLET | Freq: Every day | ORAL | 2 refills | Status: DC

## 2015-12-03 ENCOUNTER — Other Ambulatory Visit: Payer: Self-pay | Admitting: Family Medicine

## 2015-12-03 ENCOUNTER — Telehealth: Payer: Self-pay | Admitting: Family Medicine

## 2015-12-03 ENCOUNTER — Encounter: Payer: Self-pay | Admitting: Family Medicine

## 2015-12-03 DIAGNOSIS — M545 Low back pain: Secondary | ICD-10-CM | POA: Diagnosis not present

## 2015-12-03 DIAGNOSIS — M5136 Other intervertebral disc degeneration, lumbar region: Secondary | ICD-10-CM | POA: Diagnosis not present

## 2015-12-03 DIAGNOSIS — G894 Chronic pain syndrome: Secondary | ICD-10-CM | POA: Diagnosis not present

## 2015-12-03 DIAGNOSIS — G4700 Insomnia, unspecified: Secondary | ICD-10-CM

## 2015-12-03 DIAGNOSIS — M6259 Muscle wasting and atrophy, not elsewhere classified, multiple sites: Secondary | ICD-10-CM | POA: Diagnosis not present

## 2015-12-03 MED ORDER — ROPINIROLE HCL 0.5 MG PO TABS: 0.5000 mg | ORAL_TABLET | Freq: Two times a day (BID) | ORAL | 2 refills | Status: DC

## 2015-12-03 NOTE — Telephone Encounter (Signed)
RF request for Ambien 5mg  Tab was received on 12/03/2015. Requested that a 90-day supply be sent to [facility].  Last RF 09/19/2015 Last Ov: 11/05/2015 Next OV 05/05/2016 UDS: Low risk. No sample has been received since 01/05/2012.   Please advised request.

## 2015-12-03 NOTE — Telephone Encounter (Signed)
Spoke with [place], I verified patients medication to take 1 tab daily po bid  PC

## 2015-12-03 NOTE — Telephone Encounter (Signed)
[place], [place] [phone] (Phone) [phone] (Fax)    Reason for call:  Pharmacy in need of clarification regarding rOPINIRole (REQUIP) 0.5 MG tablet direction, please advise

## 2015-12-03 NOTE — Telephone Encounter (Signed)
Taken care of it  Pc

## 2015-12-03 NOTE — Telephone Encounter (Signed)
[place] [phone]    she need clarity on directions for medication ROPINIRole    Please advise further.

## 2015-12-10 ENCOUNTER — Ambulatory Visit
Admission: RE | Admit: 2015-12-10 | Discharge: 2015-12-10 | Disposition: A | Discharge status: Home / Self Care | Payer: Commercial Managed Care - HMO | Source: Ambulatory Visit | Attending: Family Medicine | Admitting: Family Medicine

## 2015-12-10 DIAGNOSIS — Z1239 Encounter for other screening for malignant neoplasm of breast: Secondary | ICD-10-CM

## 2015-12-10 DIAGNOSIS — Z1231 Encounter for screening mammogram for malignant neoplasm of breast: Secondary | ICD-10-CM | POA: Diagnosis not present

## 2015-12-10 DIAGNOSIS — Z780 Asymptomatic menopausal state: Secondary | ICD-10-CM | POA: Diagnosis not present

## 2015-12-10 DIAGNOSIS — M8589 Other specified disorders of bone density and structure, multiple sites: Secondary | ICD-10-CM | POA: Diagnosis not present

## 2015-12-10 DIAGNOSIS — E2839 Other primary ovarian failure: Secondary | ICD-10-CM

## 2015-12-11 ENCOUNTER — Telehealth: Payer: Self-pay | Admitting: Family Medicine

## 2015-12-11 NOTE — Telephone Encounter (Signed)
Relation to [id number] Call back number:[phone]   Reason for call:  Patient inquiring about bone density results

## 2015-12-11 NOTE — Telephone Encounter (Signed)
Patient called back to follow up on Bone Density results. Plse call back.

## 2015-12-11 NOTE — Telephone Encounter (Signed)
Patient called back to follow up on bone density

## 2015-12-11 NOTE — Telephone Encounter (Signed)
Pt's voice mailbox full.

## 2015-12-12 ENCOUNTER — Encounter: Payer: Self-pay | Admitting: *Deleted

## 2015-12-12 ENCOUNTER — Other Ambulatory Visit: Payer: Self-pay | Admitting: Family Medicine

## 2015-12-12 ENCOUNTER — Encounter: Payer: Self-pay | Admitting: Family Medicine

## 2015-12-12 DIAGNOSIS — R928 Other abnormal and inconclusive findings on diagnostic imaging of breast: Secondary | ICD-10-CM

## 2015-12-12 NOTE — Telephone Encounter (Signed)
No voicemail availability.

## 2015-12-12 NOTE — Telephone Encounter (Signed)
[name],  This is copy of my chart message was sent to me. Quite lengthy message. Would you review and notify her. I will send my chart message letting her know you are out of town.   I read my bone density report. VICTORIA didn't tell about the significant change that was on the report. I told them at the imaging center about the bad pain in my Right hip. Know they scanned it but don't show changes on report. The new scanner does the Right Now too. I haven't been able to walk much distance ,stand in one place due to pain. I went to see Dr Nelva Bush 3 weeks ago to renew my pain meds. I suggested a new mri but he only ordered a injection for next Wednesday. . I know doctor Rolena Infante recommended one if PT didn't help. Well it hasn't. It did at first then I moved an Now I only get relief to lay down. Can I get a MRI of my back and hips? I am going to see a neuro surgeon when I can get appointment. Waiting on a call the ones on [address]. I can't even stand long enough to wash dishes most of the time  Sorry could not cover/explain this one for you.  Percell Miller

## 2015-12-12 NOTE — Telephone Encounter (Signed)
Pt notified of Bone Density results. States she always takes her vitamin D and calcium and that she is already on fosamax.  Pt also wanted to let you know that she lost her Klonopin so she has been taking more Ambien. States she has some Ambien on the way via mail order but would like 5 or 6 day rx to hold her over until it gets here so she can sleep. Pt states she knows she should have had enough Ambien, but believers her husband may have taken some of her pills. Please advise.

## 2015-12-12 NOTE — Telephone Encounter (Signed)
[name], Please review My Chart message and advise.  Thanks!
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
## 2015-12-17 ENCOUNTER — Telehealth: Payer: Self-pay | Admitting: Family Medicine

## 2015-12-17 ENCOUNTER — Encounter: Payer: Self-pay | Admitting: Family Medicine

## 2015-12-17 NOTE — Telephone Encounter (Signed)
Caller name: Felia Relation to pt: [facility] referral coordinator Call back number: [phone] ext [phone] Pharmacy:  Reason for call: Referral Coordinator needs orders for pt since has pt has an appt for tomorrow at [facility]. Orders was sent by fax from there office today 12-17-15, they just need providers signature on it and to has it sent to them by fax. Pt has 11:30 a.m appt with them on  12-18-15. Please advise ASAP. Fax number [phone].

## 2015-12-17 NOTE — Telephone Encounter (Signed)
Pt called stating office cancelled her appt because they never received signed order from office.  Pt states she was told by the [facility] if they hear from [place] by the morning, appt could be kept as scheduled.  Checked Dr. Nonda Lou signed paperwork.  Order not noted.  Called the office and left a message on Felia's mailbox for them to refax order so that patient can keep appt.  Awaiting fax or call back.

## 2015-12-17 NOTE — Telephone Encounter (Signed)
Received faxed, provider signature, and faxed. LB

## 2015-12-18 ENCOUNTER — Other Ambulatory Visit: Payer: Commercial Managed Care - HMO

## 2015-12-18 ENCOUNTER — Ambulatory Visit
Admission: RE | Admit: 2015-12-18 | Discharge: 2015-12-18 | Disposition: A | Discharge status: Home / Self Care | Payer: Commercial Managed Care - HMO | Source: Ambulatory Visit | Attending: Family Medicine | Admitting: Family Medicine

## 2015-12-18 DIAGNOSIS — R928 Other abnormal and inconclusive findings on diagnostic imaging of breast: Secondary | ICD-10-CM

## 2015-12-18 DIAGNOSIS — R922 Inconclusive mammogram: Secondary | ICD-10-CM | POA: Diagnosis not present

## 2015-12-18 DIAGNOSIS — M533 Sacrococcygeal disorders, not elsewhere classified: Secondary | ICD-10-CM | POA: Diagnosis not present

## 2015-12-18 NOTE — Telephone Encounter (Signed)
Received faxed confirmation. LB

## 2015-12-24 ENCOUNTER — Ambulatory Visit: Payer: Self-pay | Admitting: Family

## 2015-12-24 DIAGNOSIS — M4316 Spondylolisthesis, lumbar region: Secondary | ICD-10-CM | POA: Diagnosis not present

## 2015-12-27 ENCOUNTER — Other Ambulatory Visit: Payer: Self-pay | Admitting: Family Medicine

## 2015-12-27 ENCOUNTER — Encounter: Payer: Self-pay | Admitting: Family Medicine

## 2015-12-27 ENCOUNTER — Ambulatory Visit (HOSPITAL_BASED_OUTPATIENT_CLINIC_OR_DEPARTMENT_OTHER)
Admission: RE | Admit: 2015-12-27 | Discharge: 2015-12-27 | Disposition: A | Discharge status: Home / Self Care | Payer: Commercial Managed Care - HMO | Source: Ambulatory Visit | Attending: Family Medicine | Admitting: Family Medicine

## 2015-12-27 ENCOUNTER — Ambulatory Visit (INDEPENDENT_AMBULATORY_CARE_PROVIDER_SITE_OTHER): Payer: Commercial Managed Care - HMO | Admitting: Family Medicine

## 2015-12-27 ENCOUNTER — Telehealth: Payer: Self-pay

## 2015-12-27 VITALS — BP 122/70 | HR 83 | Temp 98.5°F | Resp 16 | Ht <= 58 in | Wt 150.2 lb

## 2015-12-27 DIAGNOSIS — Z1159 Encounter for screening for other viral diseases: Secondary | ICD-10-CM

## 2015-12-27 DIAGNOSIS — M79644 Pain in right finger(s): Secondary | ICD-10-CM

## 2015-12-27 DIAGNOSIS — I700 Atherosclerosis of aorta: Secondary | ICD-10-CM | POA: Diagnosis not present

## 2015-12-27 DIAGNOSIS — R002 Palpitations: Secondary | ICD-10-CM

## 2015-12-27 DIAGNOSIS — R0902 Hypoxemia: Secondary | ICD-10-CM

## 2015-12-27 DIAGNOSIS — S6991XA Unspecified injury of right wrist, hand and finger(s), initial encounter: Secondary | ICD-10-CM | POA: Diagnosis not present

## 2015-12-27 DIAGNOSIS — R0602 Shortness of breath: Secondary | ICD-10-CM | POA: Diagnosis not present

## 2015-12-27 LAB — CBC WITH DIFFERENTIAL/PLATELET
BASOS PCT: 0.3 % (ref 0.0–3.0)
Basophils Absolute: 0 10*3/uL (ref 0.0–0.1)
EOS PCT: 1.3 % (ref 0.0–5.0)
Eosinophils Absolute: 0.1 10*3/uL (ref 0.0–0.7)
HCT: 41.3 % (ref 36.0–46.0)
Hemoglobin: 14 g/dL (ref 12.0–15.0)
LYMPHS ABS: 2.8 10*3/uL (ref 0.7–4.0)
Lymphocytes Relative: 24 % (ref 12.0–46.0)
MCHC: 33.8 g/dL (ref 30.0–36.0)
MCV: 86.5 fl (ref 78.0–100.0)
MONOS PCT: 7.2 % (ref 3.0–12.0)
Monocytes Absolute: 0.8 10*3/uL (ref 0.1–1.0)
NEUTROS ABS: 7.9 10*3/uL — AB (ref 1.4–7.7)
NEUTROS PCT: 67.2 % (ref 43.0–77.0)
Platelets: 426 10*3/uL — ABNORMAL HIGH (ref 150.0–400.0)
RBC: 4.78 Mil/uL (ref 3.87–5.11)
RDW: 14 % (ref 11.5–15.5)
WBC: 11.7 10*3/uL — ABNORMAL HIGH (ref 4.0–10.5)

## 2015-12-27 LAB — HEPATITIS C ANTIBODY: HCV Ab: NEGATIVE

## 2015-12-27 LAB — D-DIMER, QUANTITATIVE: D-Dimer, Quant: 0.5 mcg/mL FEU — ABNORMAL HIGH (ref <0.50)

## 2015-12-27 LAB — POCT URINALYSIS DIPSTICK
BILIRUBIN UA: NEGATIVE
Blood, UA: NEGATIVE
Glucose, UA: NEGATIVE
KETONES UA: NEGATIVE
LEUKOCYTES UA: NEGATIVE
Nitrite, UA: NEGATIVE
PH UA: 6.5
Protein, UA: NEGATIVE
Spec Grav, UA: 1.025
Urobilinogen, UA: 0.2

## 2015-12-27 LAB — COMPREHENSIVE METABOLIC PANEL
ALT: 22 U/L (ref 0–35)
AST: 23 U/L (ref 0–37)
Albumin: 4.7 g/dL (ref 3.5–5.2)
Alkaline Phosphatase: 55 U/L (ref 39–117)
BUN: 24 mg/dL — ABNORMAL HIGH (ref 6–23)
CHLORIDE: 105 meq/L (ref 96–112)
CO2: 26 meq/L (ref 19–32)
Calcium: 9.9 mg/dL (ref 8.4–10.5)
Creatinine, Ser: 0.83 mg/dL (ref 0.40–1.20)
GFR: 73.11 mL/min (ref >60.00)
GLUCOSE: 106 mg/dL — AB (ref 70–99)
POTASSIUM: 4 meq/L (ref 3.5–5.1)
Sodium: 139 mEq/L (ref 135–145)
Total Bilirubin: 0.4 mg/dL (ref 0.2–1.2)
Total Protein: 7.6 g/dL (ref 6.0–8.3)

## 2015-12-27 NOTE — Patient Instructions (Addendum)
  If any chest pain or worsening sob-- go to ER  alpitations A palpitation is the feeling that your heartbeat is irregular or is faster than normal. It may feel like your heart is fluttering or skipping a beat. Palpitations are usually not a serious problem. They may be caused by many things, including smoking, caffeine, alcohol, stress, and certain medicines. Although most causes of palpitations are not serious, palpitations can be a sign of a serious medical problem. In some cases, you may need further medical evaluation. Follow these instructions at home: Pay attention to any changes in your symptoms. Take these actions to help with your condition:  Avoid the following:  Caffeinated coffee, tea, soft drinks, diet pills, and energy drinks.  Chocolate.  Alcohol.  Do not use any tobacco products, such as cigarettes, chewing tobacco, and e-cigarettes. If you need help quitting, ask your health care provider.  Try to reduce your stress and anxiety. Things that can help you relax include:  Yoga.  Meditation.  Physical activity, such as swimming, jogging, or walking.  Biofeedback. This is a method that helps you learn to use your mind to control things in your body, such as your heartbeats.  Get plenty of rest and sleep.  Take over-the-counter and prescription medicines only as told by your health care provider.  Keep all follow-up visits as told by your health care provider. This is important. Contact a health care provider if:  You continue to have a fast or irregular heartbeat after 24 hours.  Your palpitations occur more often. Get help right away if:  You have chest pain or shortness of breath.  You have a severe headache.  You feel dizzy or you faint. This information is not intended to replace advice given to you by your health care provider. Make sure you discuss any questions you have with your health care provider. Document Released: 12/27/1999 Document Revised:  06/03/2015 Document Reviewed: 09/13/2014 Elsevier Interactive Patient Education  2017 [employer].

## 2015-12-27 NOTE — Telephone Encounter (Signed)
Received call fron [facility] lab regarding patient D-Dimer. = 0.50 per lab tech.  Printed copy of lab result and given to Dr. Grover Canavan nurse.
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
## 2015-12-27 NOTE — Progress Notes (Signed)
Pre visit review using our clinic review tool, if applicable. No additional management support is needed unless otherwise documented below in the visit note. 

## 2015-12-28 ENCOUNTER — Other Ambulatory Visit: Payer: Self-pay | Admitting: Family Medicine

## 2015-12-28 ENCOUNTER — Encounter: Payer: Self-pay | Admitting: Family Medicine

## 2015-12-28 DIAGNOSIS — R002 Palpitations: Secondary | ICD-10-CM

## 2015-12-28 DIAGNOSIS — G4700 Insomnia, unspecified: Secondary | ICD-10-CM

## 2015-12-28 NOTE — Telephone Encounter (Signed)
Referral in

## 2015-12-31 ENCOUNTER — Encounter: Payer: Self-pay | Admitting: Family Medicine

## 2016-01-01 ENCOUNTER — Telehealth: Payer: Self-pay | Admitting: Family Medicine

## 2016-01-01 NOTE — Telephone Encounter (Signed)
I don't know if you can look at my chart message(sent 2 lengthy message) but pt mentioned bp possibly [id number] systolic pressure the other day, palpitation(assumed pvc/tachycarda), and some pedal edema. She had quite lengthy my chart message. I have never seen her and I can't handle these type of signs/symptoms via my chart. 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 Offer her convenient appointment 1:00 Thursday or Friday. But 30 minute slot. That way she won't have to wait. Explain that to her. If her symptoms are getting worse or can't wait then UC or ED.

## 2016-01-01 NOTE — Telephone Encounter (Signed)
Caller name: Vaughan Basta  Relation to pt: [facility] [facility] back number: [phone] fax # [phone]    Reason for call:  Patient informed specialist sleep study was conducted 3 years ago and PCP has report, requesting report and a call to inform report is being faxed over.

## 2016-01-01 NOTE — Telephone Encounter (Signed)
Left a detailed message informing pt her request to have a sleep study results have been faxed to [facility]. LB

## 2016-01-01 NOTE — Telephone Encounter (Signed)
Called patient and left message to return call. MyChart message sent to pt w/ instructions below as well.

## 2016-01-01 NOTE — Telephone Encounter (Signed)
[name],  Pt called in and declined scheduling for today. She said that she has too much going on and that she's been in and out of a-fib so this isn't anything new. No answer when I tried to transfer pt to you. Pt scheduled for tomorrow at 2:15pm at her request.

## 2016-01-01 NOTE — Telephone Encounter (Signed)
Thank you. Edward-FYI.

## 2016-01-02 ENCOUNTER — Encounter: Payer: Self-pay | Admitting: Medical

## 2016-01-02 ENCOUNTER — Other Ambulatory Visit (HOSPITAL_COMMUNITY)
Admission: RE | Admit: 2016-01-02 | Discharge: 2016-01-02 | Disposition: A | Discharge status: Home / Self Care | Payer: Commercial Managed Care - HMO | Source: Ambulatory Visit | Attending: Medical | Admitting: Medical

## 2016-01-02 ENCOUNTER — Ambulatory Visit (HOSPITAL_BASED_OUTPATIENT_CLINIC_OR_DEPARTMENT_OTHER)
Admission: RE | Admit: 2016-01-02 | Discharge: 2016-01-02 | Disposition: A | Discharge status: Home / Self Care | Payer: Commercial Managed Care - HMO | Source: Ambulatory Visit | Attending: Family Medicine | Admitting: Family Medicine

## 2016-01-02 ENCOUNTER — Ambulatory Visit (INDEPENDENT_AMBULATORY_CARE_PROVIDER_SITE_OTHER): Payer: Commercial Managed Care - HMO | Admitting: Medical

## 2016-01-02 VITALS — BP 120/60 | HR 86 | Temp 98.2°F | Resp 16 | Ht <= 58 in | Wt 150.4 lb

## 2016-01-02 DIAGNOSIS — R39198 Other difficulties with micturition: Secondary | ICD-10-CM | POA: Insufficient documentation

## 2016-01-02 DIAGNOSIS — R002 Palpitations: Secondary | ICD-10-CM | POA: Insufficient documentation

## 2016-01-02 DIAGNOSIS — N898 Other specified noninflammatory disorders of vagina: Secondary | ICD-10-CM | POA: Diagnosis not present

## 2016-01-02 DIAGNOSIS — I517 Cardiomegaly: Secondary | ICD-10-CM | POA: Diagnosis not present

## 2016-01-02 DIAGNOSIS — E785 Hyperlipidemia, unspecified: Secondary | ICD-10-CM | POA: Diagnosis not present

## 2016-01-02 DIAGNOSIS — R300 Dysuria: Secondary | ICD-10-CM | POA: Diagnosis not present

## 2016-01-02 DIAGNOSIS — M79661 Pain in right lower leg: Secondary | ICD-10-CM | POA: Diagnosis not present

## 2016-01-02 DIAGNOSIS — R0600 Dyspnea, unspecified: Secondary | ICD-10-CM | POA: Diagnosis present

## 2016-01-02 LAB — POC URINALSYSI DIPSTICK (AUTOMATED)
BILIRUBIN UA: NEGATIVE
Blood, UA: NEGATIVE
Glucose, UA: NEGATIVE
KETONES UA: NEGATIVE
LEUKOCYTES UA: NEGATIVE
Nitrite, UA: NEGATIVE
PH UA: 6
Protein, UA: NEGATIVE
Spec Grav, UA: 1.02
Urobilinogen, UA: NEGATIVE

## 2016-01-02 MED ORDER — METRONIDAZOLE 500 MG PO TABS
500.0000 mg | ORAL_TABLET | Freq: Two times a day (BID) | ORAL | 0 refills | Status: DC
Start: 2016-01-02 — End: 2016-01-22

## 2016-01-02 MED ORDER — ESTROGENS, CONJUGATED 0.625 MG/GM VA CREA: TOPICAL_CREAM | VAGINAL | 3 refills | Status: DC

## 2016-01-02 MED ORDER — FLUCONAZOLE 150 MG PO TABS: 150.0000 mg | ORAL_TABLET | Freq: Once | ORAL | 0 refills | Status: AC

## 2016-01-02 NOTE — Progress Notes (Signed)
Pre visit review using our clinic review tool, if applicable. No additional management support is needed unless otherwise documented below in the visit note. 

## 2016-01-02 NOTE — Patient Instructions (Addendum)
For your palpitation, I want you to get the holter. If you get severe tachycardia or palpitations pending the holter then ED evaluation. Today you have sinus rhythm.  For your recent symptoms and concern for BV will rx flagyl as you requested. If you get any white dc over long weekend then use diflucan.'  For your calf pain I want you to watch this closely as you declined [place] of lower ext. If more calf pain, if swelling or sob then ED evaluation.  I am refilling our premarin cream  Follow up in 2 wks or as needed
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
## 2016-01-07 ENCOUNTER — Encounter: Payer: Self-pay | Admitting: Family Medicine

## 2016-01-07 LAB — URINE CYTOLOGY ANCILLARY ONLY
CHLAMYDIA, DNA PROBE: NEGATIVE
Neisseria Gonorrhea: NEGATIVE
Trichomonas: NEGATIVE

## 2016-01-07 NOTE — Telephone Encounter (Signed)
Take hctz regularly---- ov in 3-4 weeks to discuss bp If symptoms con't --- -cardiology referral

## 2016-01-08 ENCOUNTER — Encounter: Payer: Self-pay | Admitting: Family Medicine

## 2016-01-09 DIAGNOSIS — M545 Low back pain: Secondary | ICD-10-CM | POA: Diagnosis not present

## 2016-01-09 LAB — URINE CYTOLOGY ANCILLARY ONLY
BACTERIAL VAGINITIS: POSITIVE — AB
CANDIDA VAGINITIS: NEGATIVE

## 2016-01-10 DIAGNOSIS — M4316 Spondylolisthesis, lumbar region: Secondary | ICD-10-CM | POA: Diagnosis not present

## 2016-01-11 ENCOUNTER — Encounter: Payer: Self-pay | Admitting: Family Medicine

## 2016-01-14 NOTE — Telephone Encounter (Signed)
I did not order a TEE--- please find out who ordered this---cardiology orders those

## 2016-01-15 ENCOUNTER — Other Ambulatory Visit: Payer: Self-pay | Admitting: Family Medicine

## 2016-01-15 ENCOUNTER — Ambulatory Visit (INDEPENDENT_AMBULATORY_CARE_PROVIDER_SITE_OTHER): Payer: Medicare HMO

## 2016-01-15 DIAGNOSIS — R002 Palpitations: Secondary | ICD-10-CM | POA: Diagnosis not present

## 2016-01-15 DIAGNOSIS — F411 Generalized anxiety disorder: Secondary | ICD-10-CM

## 2016-01-16 ENCOUNTER — Other Ambulatory Visit: Payer: Self-pay | Admitting: Family Medicine

## 2016-01-16 DIAGNOSIS — F411 Generalized anxiety disorder: Secondary | ICD-10-CM

## 2016-01-16 MED ORDER — CLONAZEPAM 1 MG PO TABS: 1.0000 mg | ORAL_TABLET | Freq: Every day | ORAL | 0 refills | Status: DC

## 2016-01-16 NOTE — Telephone Encounter (Signed)
Called patient and left a message for call back  

## 2016-01-16 NOTE — Telephone Encounter (Addendum)
Pt returned call.  Stating she's not currently seeing a cardiologist.  Currently wearing a heart monitor.

## 2016-01-17 ENCOUNTER — Encounter: Payer: Self-pay | Admitting: Family Medicine

## 2016-01-17 ENCOUNTER — Other Ambulatory Visit: Payer: Self-pay | Admitting: Family Medicine

## 2016-01-17 DIAGNOSIS — F411 Generalized anxiety disorder: Secondary | ICD-10-CM

## 2016-01-17 NOTE — Telephone Encounter (Signed)
Faxed hardcopy for clonazepam to [facility] order pharmacy, #[phone] with 0 refills.

## 2016-01-20 ENCOUNTER — Encounter: Payer: Self-pay | Admitting: Family Medicine

## 2016-01-20 ENCOUNTER — Other Ambulatory Visit: Payer: Self-pay | Admitting: Family Medicine

## 2016-01-20 ENCOUNTER — Telehealth: Payer: Self-pay | Admitting: Family Medicine

## 2016-01-20 DIAGNOSIS — F411 Generalized anxiety disorder: Secondary | ICD-10-CM

## 2016-01-20 NOTE — Telephone Encounter (Signed)
12/17/14 [facility], INITIAL VISIT [[id number]] lvm advising patient to schedule medicare wellness appointment.

## 2016-01-20 NOTE — Telephone Encounter (Signed)
Patient scheduled medicare wellness appointment with [facility] 01/27/16 11:30am

## 2016-01-21 ENCOUNTER — Encounter: Payer: Self-pay | Admitting: Neurology

## 2016-01-21 ENCOUNTER — Encounter: Payer: Self-pay | Admitting: Family Medicine

## 2016-01-21 ENCOUNTER — Ambulatory Visit (INDEPENDENT_AMBULATORY_CARE_PROVIDER_SITE_OTHER): Payer: Medicare HMO | Admitting: Neurology

## 2016-01-21 VITALS — BP 139/74 | HR 81 | Resp 20 | Ht <= 58 in | Wt 153.0 lb

## 2016-01-21 DIAGNOSIS — Z8669 Personal history of other diseases of the nervous system and sense organs: Secondary | ICD-10-CM

## 2016-01-21 DIAGNOSIS — F39 Unspecified mood [affective] disorder: Secondary | ICD-10-CM

## 2016-01-21 DIAGNOSIS — R0683 Snoring: Secondary | ICD-10-CM

## 2016-01-21 DIAGNOSIS — F5105 Insomnia due to other mental disorder: Secondary | ICD-10-CM

## 2016-01-21 DIAGNOSIS — E6609 Other obesity due to excess calories: Secondary | ICD-10-CM | POA: Diagnosis not present
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
## 2016-01-21 NOTE — Patient Instructions (Signed)

## 2016-01-22 ENCOUNTER — Encounter: Payer: Self-pay | Admitting: Family

## 2016-01-22 ENCOUNTER — Ambulatory Visit (INDEPENDENT_AMBULATORY_CARE_PROVIDER_SITE_OTHER): Payer: Medicare HMO | Admitting: Family

## 2016-01-22 ENCOUNTER — Ambulatory Visit (HOSPITAL_BASED_OUTPATIENT_CLINIC_OR_DEPARTMENT_OTHER)
Admission: RE | Admit: 2016-01-22 | Discharge: 2016-01-22 | Disposition: A | Discharge status: Home / Self Care | Payer: Medicare HMO | Source: Ambulatory Visit | Attending: Family | Admitting: Family

## 2016-01-22 VITALS — BP 125/44 | HR 76 | Temp 98.2°F | Resp 18 | Ht <= 58 in | Wt 154.0 lb

## 2016-01-22 DIAGNOSIS — I700 Atherosclerosis of aorta: Secondary | ICD-10-CM | POA: Diagnosis not present

## 2016-01-22 DIAGNOSIS — R05 Cough: Secondary | ICD-10-CM | POA: Insufficient documentation

## 2016-01-22 DIAGNOSIS — R059 Cough, unspecified: Secondary | ICD-10-CM

## 2016-01-22 MED ORDER — ALENDRONATE SODIUM 70 MG PO TABS: ORAL_TABLET | ORAL | 3 refills | Status: DC

## 2016-01-22 MED ORDER — OMEPRAZOLE 40 MG PO CPDR
40.0000 mg | DELAYED_RELEASE_CAPSULE | Freq: Every day | ORAL | 3 refills | Status: DC
Start: 2016-01-22 — End: 2016-09-16

## 2016-01-22 NOTE — Patient Instructions (Signed)
Please complete your chest x ray on the first floor. Call if your symptoms worsen or if not improved in 3 days. You may use tessalon as needed for cough.
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
## 2016-01-22 NOTE — Progress Notes (Signed)
Pre visit review using our clinic review tool, if applicable. No additional management support is needed unless otherwise documented below in the visit note. 

## 2016-01-25 ENCOUNTER — Encounter: Payer: Self-pay | Admitting: Family Medicine

## 2016-01-27 ENCOUNTER — Ambulatory Visit: Payer: Commercial Managed Care - HMO | Admitting: *Deleted

## 2016-01-27 NOTE — Progress Notes (Deleted)
Pre visit review using our clinic review tool, if applicable. No additional management support is needed unless otherwise documented below in the visit note. 
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
## 2016-02-03 DIAGNOSIS — M4316 Spondylolisthesis, lumbar region: Secondary | ICD-10-CM | POA: Diagnosis not present

## 2016-02-04 ENCOUNTER — Telehealth: Payer: Self-pay

## 2016-02-04 ENCOUNTER — Other Ambulatory Visit: Payer: Self-pay

## 2016-02-04 ENCOUNTER — Ambulatory Visit (INDEPENDENT_AMBULATORY_CARE_PROVIDER_SITE_OTHER): Payer: Medicare HMO | Admitting: Neurology

## 2016-02-04 DIAGNOSIS — Z8669 Personal history of other diseases of the nervous system and sense organs: Secondary | ICD-10-CM

## 2016-02-04 DIAGNOSIS — G4730 Sleep apnea, unspecified: Secondary | ICD-10-CM

## 2016-02-04 DIAGNOSIS — I10 Essential (primary) hypertension: Secondary | ICD-10-CM

## 2016-02-04 DIAGNOSIS — E6609 Other obesity due to excess calories: Secondary | ICD-10-CM

## 2016-02-04 DIAGNOSIS — F39 Unspecified mood [affective] disorder: Secondary | ICD-10-CM

## 2016-02-04 DIAGNOSIS — F5105 Insomnia due to other mental disorder: Secondary | ICD-10-CM

## 2016-02-04 DIAGNOSIS — R0683 Snoring: Secondary | ICD-10-CM

## 2016-02-04 MED ORDER — HYDROCHLOROTHIAZIDE 25 MG PO TABS: 25.0000 mg | ORAL_TABLET | Freq: Every day | ORAL | 3 refills | Status: DC

## 2016-02-04 NOTE — Telephone Encounter (Signed)
Error

## 2016-02-05 ENCOUNTER — Ambulatory Visit: Payer: Medicare HMO

## 2016-02-05 ENCOUNTER — Institutional Professional Consult (permissible substitution): Payer: Commercial Managed Care - HMO | Admitting: Neurology

## 2016-02-06 ENCOUNTER — Ambulatory Visit: Payer: Self-pay

## 2016-02-07 ENCOUNTER — Ambulatory Visit: Payer: Self-pay

## 2016-02-10 ENCOUNTER — Other Ambulatory Visit: Payer: Self-pay

## 2016-02-10 NOTE — Patient Outreach (Signed)
[facility]) Care Management  02/10/2016  SHARUNDA RANSAW 1950-06-24 [id number]   Referral Date:  Assigned 11/01/15 - Referral sent 02/05/16 Source:  Emmi Prevent Screen Score 7 Issue:  H/o Back pain issues and Lyrica cost issues   [address]  [address]  [place] [phone] (H)  Outreach call #1 to patient.  Patient not reached.  RN CM left HIPAA compliant voice message with name and number for call back.  RN CM scheduled for next outreach call within one week.    Nathaneil Canary, BSN, RN, [place] Care Management Care Management Coordinator [phone] Direct [phone] Cell [phone] Office [phone] Fax [email or web address]

## 2016-02-10 NOTE — Procedures (Signed)
PATIENT'S NAME:  [name], [name] DOB:      09-Jul-1950      MR#:    [id number]     DATE OF RECORDING: 02/04/2016 REFERRING M.D.:  Roma Schanz, DO Study Performed:   Baseline Polysomnogram HISTORY:  [name] is a 66 y.o. female, living alone, seen here as a referral from Dr. [name] for a sleep evaluation. [name] presented for nocturnal diaphoresis, palpitations and low pulse oximetry measures. She feels tired all day, reports being sleep deprived, to have insomnia. Based on her report her insomnia has been chronic and has been only partially alleviated by Ambien. Many nights her thoughts are racing or she dis- oriented and has trouble going to sleep - often she feels that she doesn't sleep at all. When she wakes up in the morning her sleep has not been as refreshing and restorative as it used to be- she often has a dry mouth, too. The patient's weight 153 pounds with a height of 58 (inches), resulting in a BMI of 31.9 kg/m2. The patient's neck circumference measured 14.5 inches.  CURRENT MEDICATIONS: Albuterol inhaler; Fosamax; Klonopin; Premarin; Flexeril; Epipen; Lexapro; Flonase; Neurontin, Hydrodiuril; Norco; Levsin; Lyrica; Prilosec; Miralax; Requip; Ultram; Valtrex; Ambien   PROCEDURE:  This is a multichannel digital polysomnogram utilizing the Somnostar 11.2 system.  Electrodes and sensors were applied and monitored per AASM Specifications.   EEG, EOG, Chin and Limb EMG, were sampled at 200 Hz.  ECG, Snore and Nasal Pressure, Thermal Airflow, Respiratory Effort, CPAP Flow and Pressure, Oximetry was sampled at 50 Hz. Digital video and audio were recorded.      BASELINE STUDY Lights Out was at 22:53 and Lights On at 05:23.  Total recording time (TRT) was 391 minutes, with a total sleep time (TST) of 369.5 minutes. The patient's sleep latency was 10 minutes.  REM latency was 265.5 minutes.  The sleep efficiency was 94.5 %.     SLEEP ARCHITECTURE: WASO (Wake after sleep  onset) was 14.5 minutes.  There were 9 minutes in Stage N1, 256 minutes Stage N2, 68.5 minutes Stage N3 and 36 minutes in Stage REM.  The percentage of Stage N1 was 2.4%, Stage N2 was 69.3%, Stage N3 was 18.5% and Stage R (REM sleep) was 9.7%.   RESPIRATORY ANALYSIS:  There were a total of 8 respiratory events:  1 obstructive apnea, 0 central apneas and 7 hypopneas with 0 respiratory event related arousals (RERAs).     The total APNEA/HYPOPNEA INDEX (AHI) was 1.3/hour and the total RESPIRATORY DISTURBANCE INDEX was 1.3 /hour. 7 events occurred in REM sleep and 0 events in NREM. The REM AHI was 11.7 /hour, versus a non-REM AHI of 0.2. The patient spent 236.5 minutes of total sleep time in the supine position and 133 minutes in non-supine. The supine AHI was 0.3 versus a non-supine AHI of 3.2.  OXYGEN SATURATION & C02:  The Wake baseline 02 saturation was 94%, with the lowest being 85%. Time spent below 89% saturation equaled 17 minutes.   PERIODIC LIMB MOVEMENTS:  The patient had a total of 0 Periodic Limb Movements.   The arousals were noted as: 40 were spontaneous, 0 were associated with PLMs, and 1 was associated with respiratory events.  Audio and video analysis did not show any abnormal or unusual movements, behaviors, phonations or vocalizations. The patient took no bathroom breaks. Very mild Snoring was noted, most audible during REM . EKG was in keeping with normal sinus rhythm (NSR). Post-study,  the patient indicated that sleep was more restful than usual.    IMPRESSION: No evidence of any organic sleep disorder. PSG- Correlation to reported insomnia is negative. No apnea, no hypoxemia. Sleep perception is disturbed.    RECOMMENDATIONS: As advised in my consultation note the PCP was asked to refer to psychiatry for further evaluation in case there is no organic sleep disorder found.  No sleep clinic follow up.   I certify that I have reviewed the entire raw data recording prior to the  issuance of this report in accordance with the Standards of Accreditation of the American Academy of Sleep Medicine (AASM)      Larey Seat, MD  02-10-2016  Diplomat, American Board of Psychiatry and Neurology  Diplomat, American Board of [place] Director, [facility] [employer]

## 2016-02-10 NOTE — Patient Outreach (Signed)
[facility]) Care Management  02/10/2016  [name] 1950-12-21 [id number]   Referral Date:  Assigned 11/01/15 - Referral sent 02/05/16 Source:  Emmi Prevent Screen Score 7 Issue:  H/o Back pain issues and Lyrica cost issues   Subjective: [address]  [name] [id number] [phone] (H) Inbound call from patient for screening.   Patient completed call.   Providers: PCP:  Dr. Lyndal Pulley - 01/22/16 Pain Clinic: Dr. Nelva Bush Neurologist:  Dr. Asencion Partridge Dohmeier - last appt 02/04/16 Insurance:  Mcarthur Rossetti - patient confirms active for 2018  Psycho/Social: Patient is a retired Marine scientist  separated from her spouse and lives alone in an apartment.   Patient is working part-time jobs for income.  Patient on SSD due to chronic back issues.   States her husband has Diabetes and does not take care of himself.  States she was not able to take care of his needs and healthcare cost as well as her own contributing to separation.  Patient states her support system includes her daughter, son and husband.   Co-morbidities:  IBS, RA, chronic back pain, fibromyalgia, osteoarthritis, asthma, osteoporosis, depression. H/o recent sleep study 02/04/2016.  Dr. Brett Fairy recommended PCP make a Psychiatric Referral.  H/o former smoker - quit 01/1988.  Patient states only health issue she is having is getting her Lyrica.  States chronic pain issues is only related to her back and having no pain issues associated to her RA.   Patient uses Gabapentin, Tylenol, Vicodin, and alternates with  Ibuprofen 2 tablets twice a day for pain management regime.  States pain is well managed with current regime.    BP 125/44 01/22/2016 Weight 154 lb (70 kg) 01/22/2016 Height 56 in (142 cm) 01/22/2016 BMI 34.60 (Obese Class I) 01/22/2016  Lipid Panel completed 11/05/2015 HDL 55.800 11/05/2015 LDL 92.000 11/05/2015 Cholesterol, total 171.000 11/05/2015 Triglycerides 115.000  11/05/2015 A1C 5.800 04/10/2014 Glucose Random 106.000 12/27/2015  Medications:  Taking more than 15 medications.  Allergies:  Latex and adhesive (added to list 02/11/16) Prevnar (PCV13) 11/05/2015 Pneumovax (PPS 03/15/2013 Flu Vaccine 11/06/2015 tDAP Vaccine 09/22/2010 Patient states issues with Lyrica cost and was on the phone today with [facility] regarding her first of the year deductibles.   States she has been out of medication (Lyrica) for 3 days due to cost at pharmacy was over $200 and was not able to afford to purchase.  States past history of medication assistance for Abilify but no other active medication assistance programs at this time.    Encounter Medications:  Outpatient Encounter Prescriptions as of 02/10/2016  Medication Sig Note  . albuterol (PROAIR HFA) 108 (90 Base) MCG/ACT inhaler Inhale 2 puffs into the lungs every 6 (six) hours as needed for wheezing.   Marland Kitchen alendronate (FOSAMAX) 70 MG tablet TAKE 1 TABLET EVERY WEEK. TAKE WITH FULL GLASS OF WATER ON AN EMPTY STOMACH   . CALCIUM-MAGNESIUM-ZINC PO Take 2 capsules by mouth at bedtime.   . clonazePAM (KLONOPIN) 1 MG tablet Take 1 tablet (1 mg total) by mouth at bedtime.   . conjugated estrogens (PREMARIN) vaginal cream 1 g pv qd for 2 weeks then decrease to every other day.  Wean down to 0.5 g  2-3 days a week if possible   . cyclobenzaprine (FLEXERIL) 10 MG tablet Take 1 tablet (10 mg total) by mouth 2 (two) times daily as needed for muscle spasms.   Marland Kitchen docusate sodium 100 MG CAPS Take 100 mg by mouth 2 (  two) times daily.   Marland Kitchen EPINEPHrine (EPIPEN 2-PAK) 0.3 mg/0.3 mL IJ SOAJ injection As directed   . escitalopram (LEXAPRO) 20 MG tablet Take 1 tablet (20 mg total) by mouth daily.   . fenofibrate 160 MG tablet Take 1 tablet (160 mg total) by mouth daily.   . fluticasone (FLONASE) 50 MCG/ACT nasal spray Place 2 sprays into both nostrils daily.   Marland Kitchen gabapentin (NEURONTIN) 600 MG tablet Take 1 tablet by mouth 3 (three) times daily.  09/02/2015: Received from: External Pharmacy  . hydrochlorothiazide (HYDRODIURIL) 25 MG tablet Take 1 tablet (25 mg total) by mouth daily.   Marland Kitchen HYDROcodone-acetaminophen (NORCO/VICODIN) 5-325 MG per tablet Take 1 tablet by mouth every 6 (six) hours as needed for moderate pain.   . hyoscyamine (LEVSIN/SL) 0.125 MG SL tablet Place 1 tablet (0.125 mg total) under the tongue every 4 (four) hours as needed.   Marland Kitchen LYRICA 75 MG capsule Take 75 mg by mouth daily. 01/21/2016: Received from: External Pharmacy  . Multiple Vitamin (MULTIVITAMIN WITH MINERALS) TABS tablet Take 1 tablet by mouth daily.   . niacin (GNP NIACIN) 250 MG tablet Take 500 mg by mouth daily with breakfast.    . Omega-3 Fatty Acids (FISH OIL) 1200 MG CAPS Take 2,400 capsules by mouth daily.   Marland Kitchen omeprazole (PRILOSEC) 40 MG capsule Take 1 capsule (40 mg total) by mouth daily.   Marland Kitchen OVER THE COUNTER MEDICATION Take 2 tablets by mouth daily. TUMERIC   . polyethylene glycol (MIRALAX / GLYCOLAX) packet Take 17 g by mouth 2 (two) times daily.   Marland Kitchen rOPINIRole (REQUIP) 0.5 MG tablet Take 1 tablet (0.5 mg total) by mouth 2 (two) times daily.   . traMADol (ULTRAM) 50 MG tablet Take 1 tablet (50 mg total) by mouth every 6 (six) hours as needed.   . valACYclovir (VALTREX) 1000 MG tablet Take 1 tablet (1,000 mg total) by mouth 2 (two) times daily.   Marland Kitchen zolpidem (AMBIEN) 5 MG tablet TAKE 1 TABLET AT BEDTIME AS NEEDED    No facility-administered encounter medications on file as of 02/10/2016.     Functional Status:  In your present state of health, do you have any difficulty performing the following activities: 02/11/2016 11/05/2015  Hearing? N N  Vision? N N  Difficulty concentrating or making decisions? N N  Walking or climbing stairs? N N  Dressing or bathing? N N  Doing errands, shopping? N N  Preparing Food and eating ? N -  Using the Toilet? N -  In the past six months, have you accidently leaked urine? N -  Do you have problems with loss of bowel  control? N -  Managing your Medications? N -  Managing your Finances? N -  Housekeeping or managing your Housekeeping? N -  Some recent data might be hidden    Fall/Depression Screening: PHQ 2/9 Scores 02/11/2016 01/02/2016 11/05/2015 12/17/2014 04/10/2014 03/15/2013  PHQ - 2 Score 0 0 0 2 0 0  PHQ- 9 Score - - - 6 - -    Fall Risk  02/11/2016 01/02/2016 11/05/2015 06/20/2015 12/17/2014  Falls in the past year? No No No Yes Yes  Number falls in past yr: - - - 2 or more 1  Injury with Fall? - - - Yes No  Risk for fall due to : Medication side effect;Other (Comment) - - Other (Comment) -  Risk for fall due to (comments): chronic back pain - - - -  Follow up - - - Falls  evaluation completed;Education provided;Falls prevention discussed Education provided;Falls prevention discussed    Assessment / Plan:  Referral Date:  Assigned 11/01/15 - Referral sent 02/05/16 Source:  Emmi Prevent Screen Score 7 Issue:  H/o Back pain issues and Lyrica cost issues  Screening:  02/11/16  [facility] Pharmacy Referral  (02/11/16) -more than 15 medications (polymedication) -medication cost issues  Case closed to this RN CM discipline;  Patient assessed and no RN CM needs identified.   RN CM advised patient in option for [facility] SW referral should social needs be an issue.   Case referred for [place] services.  [place] notified.   Nathaneil Canary, BSN, RN, [place] Management Care Management Coordinator [phone] Direct [phone] Cell [phone] Office [phone] Fax [email or web address]

## 2016-02-11 ENCOUNTER — Ambulatory Visit: Payer: Self-pay

## 2016-02-12 ENCOUNTER — Telehealth: Payer: Self-pay

## 2016-02-12 ENCOUNTER — Other Ambulatory Visit: Payer: Self-pay | Admitting: Pharmacist

## 2016-02-12 NOTE — Telephone Encounter (Signed)
----- Message from Larey Seat, MD sent at 02/10/2016  5:25 PM EST ----- PATIENT'S NAME:  [name], [name] DOB:      1950/06/14      MR#:    [id number]     DATE OF RECORDING: 02/04/2016 REFERRING M.D.:  Roma Schanz, DO Study Performed:   Baseline Polysomnogram HISTORY:  [name] is a 66 y.o. female, living alone, seen here as a referral from Dr. [name] for a sleep evaluation. Mrs. Gerwin presented for nocturnal diaphoresis, palpitations and low pulse oximetry measures. She feels tired all day, reports being sleep deprived, to have insomnia. Based on her report her insomnia has been chronic and has been only partially alleviated by Ambien. Many nights her thoughts are racing or she dis- oriented and has trouble going to sleep - often she feels that she doesn't sleep at all. When she wakes up in the morning her sleep has not been as refreshing and restorative as it used to be- she often has a dry mouth, too. The patient's weight 153 pounds with a height of 58 (inches), resulting in a BMI of 31.9 kg/m2. The patient's neck circumference measured 14.5 inches.  CURRENT MEDICATIONS: Albuterol inhaler; Fosamax; Klonopin; Premarin; Flexeril; Epipen; Lexapro; Flonase; Neurontin, Hydrodiuril; Norco; Levsin; Lyrica; Prilosec; Miralax; Requip; Ultram; Valtrex; Ambien   PROCEDURE:  This is a multichannel digital polysomnogram utilizing the Somnostar 11.2 system.  Electrodes and sensors were applied and monitored per AASM Specifications.   EEG, EOG, Chin and Limb EMG, were sampled at 200 Hz.  ECG, Snore and Nasal Pressure, Thermal Airflow, Respiratory Effort, CPAP Flow and Pressure, Oximetry was sampled at 50 Hz. Digital video and audio were recorded.      BASELINE STUDY Lights Out was at 22:53 and Lights On at 05:23.  Total recording time (TRT) was 391 minutes, with a total sleep time (TST) of 369.5 minutes. The patient's sleep latency was 10 minutes.  REM latency was 265.5 minutes.  The  sleep efficiency was 94.5 %.     SLEEP ARCHITECTURE: WASO (Wake after sleep onset) was 14.5 minutes.  There were 9 minutes in Stage N1, 256 minutes Stage N2, 68.5 minutes Stage N3 and 36 minutes in Stage REM.  The percentage of Stage N1 was 2.4%, Stage N2 was 69.3%, Stage N3 was 18.5% and Stage R (REM sleep) was 9.7%.   RESPIRATORY ANALYSIS:  There were a total of 8 respiratory events:  1 obstructive apnea, 0 central apneas and 7 hypopneas with 0 respiratory event related arousals (RERAs).     The total APNEA/HYPOPNEA INDEX (AHI) was 1.3/hour and the total RESPIRATORY DISTURBANCE INDEX was 1.3 /hour. 7 events occurred in REM sleep and 0 events in NREM. The REM AHI was 11.7 /hour, versus a non-REM AHI of 0.2. The patient spent 236.5 minutes of total sleep time in the supine position and 133 minutes in non-supine. The supine AHI was 0.3 versus a non-supine AHI of 3.2.  OXYGEN SATURATION & C02:  The Wake baseline 02 saturation was 94%, with the lowest being 85%. Time spent below 89% saturation equaled 17 minutes.   PERIODIC LIMB MOVEMENTS:  The patient had a total of 0 Periodic Limb Movements.   The arousals were noted as: 40 were spontaneous, 0 were associated with PLMs, and 1 was associated with respiratory events.  Audio and video analysis did not show any abnormal or unusual movements, behaviors, phonations or vocalizations. The patient took no bathroom breaks. Very mild Snoring was noted, most  audible during REM . EKG was in keeping with normal sinus rhythm (NSR). Post-study, the patient indicated that sleep was more restful than usual.    IMPRESSION: No evidence of any organic sleep disorder. PSG- Correlation to reported insomnia is negative. No apnea, no hypoxemia. Sleep perception is disturbed.    RECOMMENDATIONS: As advised in my consultation note the PCP was asked to refer to psychiatry for further evaluation in case there is no organic sleep disorder found.  No sleep clinic follow up.    I certify that I have reviewed the entire raw data recording prior to the issuance of this report in accordance with the Standards of Accreditation of the American Academy of Sleep Medicine (AASM)      Larey Seat, MD  02-10-2016  Diplomat, American Board of Psychiatry and Neurology  Diplomat, American Board of [place] Director, [employer] [facility] [employer]

## 2016-02-12 NOTE — Telephone Encounter (Signed)
I spoke to patient and she is aware of results and recommendations, voiced understanding.

## 2016-02-12 NOTE — Patient Outreach (Addendum)
[facility] [facility]) Care Management  [place]   02/12/2016  TANYANIKA SEABOURN 1950/03/15 [id number]  Subjective:  Patient was called regarding medication assistance per referral from [place], [place].  HIPAA identifiers were obtained.  Patient has multiple medical conditions including but not limited to:  irritable bowel syndrome, rheumatoid arthritis, asthma, osteoporosis, chronic back pain, fibromyalgia, osteoarthritis, anxiety and depression.  Patient reported Lyrica is ready for pick up at her local pharmacy but it has a >$200 co-pay that she cannot afford.  Patient has [employer] Medicare Gold Plus which has a $195 deductible for medications tier 3-5.   Objective: .    Encounter Medications: Outpatient Encounter Prescriptions as of 02/12/2016  Medication Sig Note  . albuterol (PROAIR HFA) 108 (90 Base) MCG/ACT inhaler Inhale 2 puffs into the lungs every 6 (six) hours as needed for wheezing.   Marland Kitchen alendronate (FOSAMAX) 70 MG tablet TAKE 1 TABLET EVERY WEEK. TAKE WITH FULL GLASS OF WATER ON AN EMPTY STOMACH   . Calcium-Magnesium-Vitamin D [id number] MG-MG-UNIT TABS Take 1 tablet by mouth daily.   . clonazePAM (KLONOPIN) 1 MG tablet Take 1 tablet (1 mg total) by mouth at bedtime.   . conjugated estrogens (PREMARIN) vaginal cream 1 g pv qd for 2 weeks then decrease to every other day.  Wean down to 0.5 g  2-3 days a week if possible   . cyclobenzaprine (FLEXERIL) 10 MG tablet Take 1 tablet (10 mg total) by mouth 2 (two) times daily as needed for muscle spasms.   Marland Kitchen docusate sodium 100 MG CAPS Take 100 mg by mouth 2 (two) times daily. 02/12/2016: PRN only  . EPINEPHrine (EPIPEN 2-PAK) 0.3 mg/0.3 mL IJ SOAJ injection As directed   . escitalopram (LEXAPRO) 20 MG tablet Take 1 tablet (20 mg total) by mouth daily.   . fenofibrate 160 MG tablet Take 1 tablet (160 mg total) by mouth daily.   . fluticasone (FLONASE) 50 MCG/ACT nasal spray Place 2 sprays into both  nostrils daily.   Marland Kitchen gabapentin (NEURONTIN) 600 MG tablet Take 1 tablet by mouth 3 (three) times daily. (patient reports only taking this when out of Lyrica) 02/12/2016: Takes this when out of Lyrica  . hydrochlorothiazide (HYDRODIURIL) 25 MG tablet Take 1 tablet (25 mg total) by mouth daily.   Marland Kitchen HYDROcodone-acetaminophen (NORCO/VICODIN) 5-325 MG per tablet Take 1 tablet by mouth every 6 (six) hours as needed for moderate pain.   Marland Kitchen LYRICA 75 MG capsule Take 75 mg by mouth daily.   . Multiple Vitamin (MULTIVITAMIN WITH MINERALS) TABS tablet Take 1 tablet by mouth daily.   Marland Kitchen omeprazole (PRILOSEC) 40 MG capsule Take 1 capsule (40 mg total) by mouth daily.   Marland Kitchen OVER THE COUNTER MEDICATION Take 2 tablets by mouth daily. TUMERIC   . rOPINIRole (REQUIP) 0.5 MG tablet Take 1 tablet (0.5 mg total) by mouth 2 (two) times daily.   . valACYclovir (VALTREX) 1000 MG tablet Take 1 tablet (1,000 mg total) by mouth 2 (two) times daily.   Marland Kitchen zolpidem (AMBIEN) 5 MG tablet TAKE 1 TABLET AT BEDTIME AS NEEDED   . hyoscyamine (LEVSIN/SL) 0.125 MG SL tablet Place 1 tablet (0.125 mg total) under the tongue every 4 (four) hours as needed. (Patient not taking: Reported on 02/12/2016)   . niacin (GNP NIACIN) 250 MG tablet Take 500 mg by mouth daily with breakfast.    . Omega-3 Fatty Acids (FISH OIL) 1200 MG CAPS Take 2,400 capsules by mouth daily.   Marland Kitchen  polyethylene glycol (MIRALAX / GLYCOLAX) packet Take 17 g by mouth 2 (two) times daily. (Patient not taking: Reported on 02/12/2016)   . traMADol (ULTRAM) 50 MG tablet Take 1 tablet (50 mg total) by mouth every 6 (six) hours as needed. (Patient not taking: Reported on 02/12/2016)   . [DISCONTINUED] CALCIUM-MAGNESIUM-ZINC PO Take 2 capsules by mouth at bedtime.    No facility-administered encounter medications on file as of 02/12/2016.     Functional Status: In your present state of health, do you have any difficulty performing the following activities: 02/11/2016 11/05/2015   Hearing? N N  Vision? N N  Difficulty concentrating or making decisions? N N  Walking or climbing stairs? N N  Dressing or bathing? N N  Doing errands, shopping? N N  Preparing Food and eating ? N -  Using the Toilet? N -  In the past six months, have you accidently leaked urine? N -  Do you have problems with loss of bowel control? N -  Managing your Medications? N -  Managing your Finances? N -  Housekeeping or managing your Housekeeping? N -  Some recent data might be hidden    Fall/Depression Screening: PHQ 2/9 Scores 02/11/2016 01/02/2016 11/05/2015 12/17/2014 04/10/2014 03/15/2013  PHQ - 2 Score 0 0 0 2 0 0  PHQ- 9 Score - - - 6 - -    Assessment: Medications and Allergies were reviewed with the patient over the phone:   Drugs sorted by system:  Neurologic/Psychologic: Escitalopram Clonazepam   Cardiovascular: Hydrochlorothiazide Fenofibrate  Pulmonary/Allergy: Albuterol HFA Fluticasone Nasal Spray  Gastrointestinal: Omeprazole Hyoscyamine Sulfate Docusate  Endocrine: Alendronate  Pain: Gabapentin  Hydrocodone/Acetaminophen Tramadol Cyclobenzaprine Lyrica Ropinirole  Vitamins/Minerals: Calcium/Magnesium/Zinc Multivitamin Tumeric  Miscellaneous: Zolpidem  Premarin Vaginal Cream Valtrex Epi Pen Autoinjector  Medication Review Findings:  1. Potential drug/drug interaction CNS depression caused by the combination of cyclobenzaprine, ropinirole tramadol, zolpidem, hydrocodone/acetaminophen, clonazepam  2. Dose too high Omeprazole 40mg - proton pump inhibitors have been known to cause bone mineral loss. Patient is already taking alendronate and calcium/vitamin D supplementation.  If deemed therapeutically appropriate, consider decreasing the dose to Omeprazole 20 mg daily.  3. Medications patient reported no longer taking: Niacin Omega 3 Fatty Acids Polyethylene Glycol   Medication Assistance:  Patient is over income for the Extra Help  program. The prescription deductible for her insurance is $195 for medications tiers 3-5.  Most of her medications are tier 1-2 with the exception of:  Lyrica-tier 3 Premarin Vaginal Cream-tier 3 Hydrocodone/APAP-tier 3 Valtrex-Tier 3 EpiPen- Tier 3 Hyoscyamine Sulfate-not covered Cylobenzaprine 10mg -tier 4 (requires a Pa)  Based on initial review, patient may qualify for [employer]'s Patient Assistance Program to obtain Lyrica and Premarin Vaginal Cream.    She does not qualify for [employer]'s Patient Assistance Program for EpiPen as patients with Medicare Part D are excluded.    Valtrex can be obtained from [place] Patient Assistance Program but requires patients to spend $600 on medications to become eligible.  Hydrocodone/Acetaminophen, cyclobenzaprine, and hyoscyamine do not have patient assistance programs  Tizanidine could be substituted for cyclobenzaprine since tizanidine is a tier 1 medication and cyclobenzaprine requires a PA and is tier 4 (if deemed therapeutically appropriate)  A Good Rx Coupon is available for hyoscyamine which can be used at [employer] ($30 for 120 tablets)-patient may need a new prescription if hyoscyamine is still deemed necessary  Patient was educated on Medicare Part D, copays, deductibles and the Extra Help Program.  She demonstrated understanding about  providing financial documentation.  Patient requested the Patient Assistance Applications be faxed to her so she can hand deliver them to her providers for completion and signature.   Plan:  1.  Route note to PCP. 2.  Applications for Premarin and Lyrica will be faxed to the patient at her request to:  [phone] 3.  Follow up with the patient in one week to check on the progress of the applications  [name], PharmD, [place] Clinical Pharmacist [phone]

## 2016-02-13 ENCOUNTER — Encounter: Payer: Self-pay | Admitting: Pharmacist

## 2016-02-13 ENCOUNTER — Other Ambulatory Visit: Payer: Self-pay | Admitting: Pharmacist

## 2016-02-13 NOTE — Patient Outreach (Addendum)
[place] [facility]) Care Management  02/13/2016  [name] Jun 08, 1950 [id number]  Patient was called regarding medication assistance.  A HIPAA compliant message was left on patient's voicemail.  Purpose of call was to see if she received the patient assistance program applications that were faxed to her on 02/12/2016.  I will attempt to call the patient back by the end of business 02/14/16.    Addendum:  Patient called back. HIPAA identifiers were obtained.  Patient confirmed that she did receive the applications via fax yesterday.  She also reported Dr. Nelva Bush' office is going to supply her with samples to help in the interim.  Patient said she will take the forms to her PCP office and Dr. Nelva Bush' office today when she goes to pick up the samples.    Plan:  Follow up with the patient next week to see if she got the forms signed.  [name], PharmD, [place] Clinical Pharmacist [phone]

## 2016-02-20 ENCOUNTER — Other Ambulatory Visit: Payer: Self-pay | Admitting: Pharmacist

## 2016-02-20 NOTE — Patient Outreach (Signed)
[place] [facility]) Care Management  02/20/2016  [name] 09-22-1950 [id number]   Called patient to follow up on medication assistance paperwork.  Patient Assistance Applications were faxed to the patient and I was checking to see if patient completed the forms and dropped them of to her providers as was discussed the last time we spoke. HIPAA compliant message was left.   Plan:  I will call the patient back later this week if I do not hear back from her.  [name], PharmD, [place] Clinical Pharmacist [phone]

## 2016-02-26 ENCOUNTER — Other Ambulatory Visit: Payer: Self-pay | Admitting: Pharmacist

## 2016-02-26 NOTE — Patient Outreach (Signed)
[place] [facility]) Care Management  02/26/2016  [name] 01/15/1950 [id number]   Patient was called regarding medication assistance.  No answer. HIPAA compliant message left on patient's voicemail.   Plan:  I will call the patient back later this week.  [name], PharmD, [place] Clinical Pharmacist [phone]

## 2016-02-27 ENCOUNTER — Other Ambulatory Visit: Payer: Self-pay | Admitting: Pharmacist

## 2016-02-27 NOTE — Patient Outreach (Signed)
[place] [facility]) Care Management  02/27/2016  [name] 20-Dec-1950 [id number]   Patient was called to follow up on the status of her Patient Assistance Applications.  HIPAA identifiers were obtained. Patient was previously faxed a patient assistance application from [place] for Lyrica and Premarin vaginal cream.  Patient reported she took the forms to her provider for signature and requested they fax the applications for her.  Patient stated she confirmed with her providers that the forms were faxed.  Patient shared that she did not include her financial information.    [employer] was called to check on the status of the patient's application.  The [employer], Izora Gala said applications take 2-3 business weeks to be processed. As such she could not see Ms. Keech's application.  Plan:  1.  I will follow up with [place] in 7-10 business days.  2.  I will follow up with the patient after I speak with [place].  [name], PharmD, [place] Clinical Pharmacist [phone]

## 2016-02-28 ENCOUNTER — Ambulatory Visit: Payer: Self-pay | Admitting: Pharmacist

## 2016-03-10 ENCOUNTER — Other Ambulatory Visit: Payer: Self-pay | Admitting: Pharmacist

## 2016-03-10 NOTE — Patient Outreach (Signed)
[place] [facility]) Care Management  03/10/2016  [name] February 20, 1950 [id number]   [place] [employer] was called to follow up on the status of an application sent for both Lyrica and Premarin Cream.  The [employer] representative reported patient's application was on file but had not been processed because a copy of her identification and her financial information were not included with the original application.  The representative said the necessary information could be faxed to: [phone].  She also reported [name] ID as:  [id number].  Patient was called. HIPAA identifiers were obtained.  Patient said someone from [place] called her today after I called them.  She reported sending them a copy of her license.  (A copy of her license was also sent from our office).  Patient was provided with the fax number to send [place] her financial information and she said she would send the information ASAP.  Plan:  Follow up on the status of her application in 0000000 business days.  [name], PharmD, [place] Clinical Pharmacist [phone]

## 2016-03-17 ENCOUNTER — Ambulatory Visit: Payer: Self-pay | Admitting: Pharmacist

## 2016-03-19 ENCOUNTER — Encounter: Payer: Self-pay | Admitting: Pharmacist

## 2016-03-19 ENCOUNTER — Other Ambulatory Visit: Payer: Self-pay | Admitting: Pharmacist

## 2016-03-19 NOTE — Patient Outreach (Signed)
[facility]) Care Management  03/19/2016  DAMYA COMLEY 1950/09/23 [id number]   Called patient to follow up on medication assistance status. HIPAA identifiers were obtained.  [employer] (patient assistance) was also called. [employer] reported Ms. Ocanas is approved to receive Lyrica 50mg  until 01/11/2017 at no cost.  However, her medication cannot be delivered until [employer] receives the original provider signature.  The [employer] representative said they called Dr. Megan Mans office on 03/17/16 to request the original signature.  In addition, [employer] did not have an application for Premarin cream.  A medication change/request form can be completed by Dr. Megan Mans office and faxed for Mrs. Gleghorn to be able to receive Premarin cream at no cost until 01/11/2017.  The representative said he would fax the form to my office and I will call and fax the form Dr. Megan Mans office.  Plan: Follow up in 7-10 business days to see if the patient has received her medications.  [name], PharmD, [place] Clinical Pharmacist [phone]

## 2016-03-26 ENCOUNTER — Other Ambulatory Visit: Payer: Self-pay | Admitting: Family Medicine

## 2016-03-26 ENCOUNTER — Encounter: Payer: Self-pay | Admitting: Family Medicine

## 2016-03-26 ENCOUNTER — Other Ambulatory Visit: Payer: Self-pay | Admitting: Pharmacist

## 2016-03-26 DIAGNOSIS — G4700 Insomnia, unspecified: Secondary | ICD-10-CM

## 2016-03-26 MED ORDER — ESCITALOPRAM OXALATE 20 MG PO TABS: 20.0000 mg | ORAL_TABLET | Freq: Every day | ORAL | 0 refills | Status: DC

## 2016-03-26 MED ORDER — VALACYCLOVIR HCL 1 G PO TABS: 1000.0000 mg | ORAL_TABLET | Freq: Two times a day (BID) | ORAL | 1 refills | Status: DC

## 2016-03-26 NOTE — Telephone Encounter (Signed)
Faxed hardcopy for zolpidem to [facility] order (called the patient to confirm pharmacy) She then requested valtrex be sent in to [place] local pharmacy which I did send in for her.

## 2016-03-26 NOTE — Patient Outreach (Signed)
[place] [facility]) Care Management  03/26/2016  TERREE GAULTNEY 09-14-1950 [id number]   Called patient to see if she received her Lyrica from [employer]. HIPAA identifiers were obtained.  Patient said she did not receive Lyrica yet .  [employer] was called. They confirmed a new prescription was received from Dr. Nelva Bush for Lyrica 50mg  and would be shipped today.    Plan:  1. Update patients medication list to reflect Lyrica 50mg  vs Lyrica 75mg .  2.  Follow up with the patient in 1 week to see if she received the shipment.  3.  Deliver Form to Dr. Megan Mans office for Premarin.  4.  Close Pharmacy Case.  [name], PharmD, [place] Clinical Pharmacist [phone]

## 2016-03-26 NOTE — Addendum Note (Signed)
Addended by: Sharon Seller B on: 03/26/2016 11:17 AM   Modules accepted: Orders

## 2016-03-26 NOTE — Telephone Encounter (Signed)
Requesting:  zolpidem Contract   None UDS   None Last OV    12/27/2015 Last Refill   #90 with 0 refills on 12/03/2015  Please Advise

## 2016-04-02 ENCOUNTER — Other Ambulatory Visit: Payer: Self-pay | Admitting: Pharmacist

## 2016-04-02 NOTE — Patient Outreach (Signed)
[place] [facility]) Care Management  04/02/2016  [name] Jul 29, 1950 [id number]   Called patient to follow up on the delivery of Lyrica from the Patient Assistance Program.  HIPAA identifiers were obtained.  Patient confirmed [employer] attempted to deliver her medication but she was not home and a signature is required.  She said she was planning to go to [employer] this week to pick up her package (Lyrica 3 months supply).  Plan:  Pharmacy case will be closed as patient has been given resources and received her needed medication at no cost. Patient has my contact information for any future questions or concerns.  [name], PharmD, [place] Clinical Pharmacist [phone]

## 2016-04-06 ENCOUNTER — Other Ambulatory Visit: Payer: Self-pay | Admitting: *Deleted

## 2016-04-06 MED ORDER — ESCITALOPRAM OXALATE 20 MG PO TABS: 20.0000 mg | ORAL_TABLET | Freq: Every day | ORAL | 0 refills | Status: DC

## 2016-04-19 ENCOUNTER — Other Ambulatory Visit: Payer: Self-pay | Admitting: Family Medicine

## 2016-04-19 DIAGNOSIS — F411 Generalized anxiety disorder: Secondary | ICD-10-CM

## 2016-04-20 ENCOUNTER — Other Ambulatory Visit: Payer: Self-pay | Admitting: Family Medicine

## 2016-04-20 DIAGNOSIS — F411 Generalized anxiety disorder: Secondary | ICD-10-CM

## 2016-04-20 DIAGNOSIS — M25512 Pain in left shoulder: Secondary | ICD-10-CM

## 2016-04-20 MED ORDER — CLONAZEPAM 1 MG PO TABS: 1.0000 mg | ORAL_TABLET | Freq: Every day | ORAL | 0 refills | Status: DC

## 2016-04-29 ENCOUNTER — Encounter: Payer: Self-pay | Admitting: Medical

## 2016-04-29 ENCOUNTER — Ambulatory Visit (INDEPENDENT_AMBULATORY_CARE_PROVIDER_SITE_OTHER): Payer: Medicare HMO | Admitting: Medical

## 2016-04-29 ENCOUNTER — Ambulatory Visit (HOSPITAL_BASED_OUTPATIENT_CLINIC_OR_DEPARTMENT_OTHER)
Admission: RE | Admit: 2016-04-29 | Discharge: 2016-04-29 | Disposition: A | Discharge status: Home / Self Care | Payer: Medicare HMO | Source: Ambulatory Visit | Attending: Medical | Admitting: Medical

## 2016-04-29 VITALS — BP 115/67 | HR 91 | Temp 98.1°F | Resp 16 | Wt 150.0 lb

## 2016-04-29 DIAGNOSIS — M25512 Pain in left shoulder: Secondary | ICD-10-CM | POA: Diagnosis not present

## 2016-04-29 DIAGNOSIS — L989 Disorder of the skin and subcutaneous tissue, unspecified: Secondary | ICD-10-CM | POA: Diagnosis not present

## 2016-04-29 DIAGNOSIS — J302 Other seasonal allergic rhinitis: Secondary | ICD-10-CM

## 2016-04-29 DIAGNOSIS — S4992XA Unspecified injury of left shoulder and upper arm, initial encounter: Secondary | ICD-10-CM | POA: Diagnosis not present

## 2016-04-29 DIAGNOSIS — Z9181 History of falling: Secondary | ICD-10-CM

## 2016-04-29 MED ORDER — FLUTICASONE PROPIONATE 50 MCG/ACT NA SUSP: 2.0000 | Freq: Every day | NASAL | 3 refills | Status: DC

## 2016-04-29 NOTE — Progress Notes (Signed)
Pre visit review using our clinic review tool, if applicable. No additional management support is needed unless otherwise documented below in the visit note. 

## 2016-04-29 NOTE — Patient Instructions (Addendum)
For your shoulder pain will get shoulder xray. Can continue alleve and use vicodin intermittent if needed. If shoulder pain persists can refer to sports med or orthopedist.  For our falls can refer you to PT for gait evaluation if you want.   For your allergy history refilled your steroid nasal spray  Follow up in 7-10 days or as needed  Referring to derm for skin lesion. If no call by one week call and speak with Anderson Malta or Steffanie Dunn for update.

## 2016-04-29 NOTE — Telephone Encounter (Signed)
Referral to ortho placed.
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
## 2016-04-30 ENCOUNTER — Encounter: Payer: Self-pay | Admitting: Medical

## 2016-05-01 ENCOUNTER — Other Ambulatory Visit: Payer: Self-pay | Admitting: Family Medicine

## 2016-05-01 ENCOUNTER — Telehealth (INDEPENDENT_AMBULATORY_CARE_PROVIDER_SITE_OTHER): Payer: Self-pay

## 2016-05-01 ENCOUNTER — Encounter: Payer: Self-pay | Admitting: Medical

## 2016-05-01 ENCOUNTER — Encounter: Payer: Self-pay | Admitting: Family Medicine

## 2016-05-01 NOTE — Telephone Encounter (Signed)
Patient called concerning a referral that was sent by her PCP to our office for her shoulder.  Currently patient would like to hold off on scheduling an appointment to be seen.

## 2016-05-04 DIAGNOSIS — M533 Sacrococcygeal disorders, not elsewhere classified: Secondary | ICD-10-CM | POA: Diagnosis not present

## 2016-05-07 ENCOUNTER — Encounter: Payer: Self-pay | Admitting: Family Medicine

## 2016-05-07 NOTE — Telephone Encounter (Signed)
Will you look into derm referral and update pt.

## 2016-05-15 DIAGNOSIS — H2512 Age-related nuclear cataract, left eye: Secondary | ICD-10-CM | POA: Diagnosis not present

## 2016-05-18 DIAGNOSIS — D225 Melanocytic nevi of trunk: Secondary | ICD-10-CM | POA: Diagnosis not present

## 2016-05-18 DIAGNOSIS — L821 Other seborrheic keratosis: Secondary | ICD-10-CM | POA: Diagnosis not present

## 2016-05-27 DIAGNOSIS — M533 Sacrococcygeal disorders, not elsewhere classified: Secondary | ICD-10-CM | POA: Diagnosis not present
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
## 2016-06-18 ENCOUNTER — Other Ambulatory Visit: Payer: Self-pay | Admitting: Family Medicine

## 2016-06-18 DIAGNOSIS — G4700 Insomnia, unspecified: Secondary | ICD-10-CM

## 2016-06-19 ENCOUNTER — Ambulatory Visit (HOSPITAL_BASED_OUTPATIENT_CLINIC_OR_DEPARTMENT_OTHER)
Admission: RE | Admit: 2016-06-19 | Discharge: 2016-06-19 | Disposition: A | Discharge status: Home / Self Care | Payer: Medicare HMO | Source: Ambulatory Visit | Attending: Family Medicine | Admitting: Family Medicine

## 2016-06-19 ENCOUNTER — Ambulatory Visit (INDEPENDENT_AMBULATORY_CARE_PROVIDER_SITE_OTHER): Payer: Medicare HMO | Admitting: Family Medicine

## 2016-06-19 ENCOUNTER — Encounter: Payer: Self-pay | Admitting: Family Medicine

## 2016-06-19 VITALS — BP 110/70 | HR 65 | Ht <= 58 in | Wt 151.0 lb

## 2016-06-19 DIAGNOSIS — Z0000 Encounter for general adult medical examination without abnormal findings: Secondary | ICD-10-CM | POA: Diagnosis not present

## 2016-06-19 DIAGNOSIS — R296 Repeated falls: Secondary | ICD-10-CM | POA: Diagnosis not present

## 2016-06-19 DIAGNOSIS — M25562 Pain in left knee: Secondary | ICD-10-CM | POA: Diagnosis not present

## 2016-06-19 DIAGNOSIS — E785 Hyperlipidemia, unspecified: Secondary | ICD-10-CM | POA: Diagnosis not present

## 2016-06-19 DIAGNOSIS — Z96652 Presence of left artificial knee joint: Secondary | ICD-10-CM | POA: Diagnosis not present

## 2016-06-19 LAB — CBC WITH DIFFERENTIAL/PLATELET
Basophils Absolute: 79 {cells}/uL (ref 0–200)
Basophils Relative: 1 %
Eosinophils Absolute: 316 {cells}/uL (ref 15–500)
Eosinophils Relative: 4 %
HCT: 39.4 % (ref 35.0–45.0)
Hemoglobin: 12.9 g/dL (ref 11.7–15.5)
Lymphocytes Relative: 38 %
Lymphs Abs: 3002 {cells}/uL (ref 850–3900)
MCH: 28.3 pg (ref 27.0–33.0)
MCHC: 32.7 g/dL (ref 32.0–36.0)
MCV: 86.4 fL (ref 80.0–100.0)
MPV: 10.1 fL (ref 7.5–12.5)
Monocytes Absolute: 711 {cells}/uL (ref 200–950)
Monocytes Relative: 9 %
Neutro Abs: 3792 {cells}/uL (ref 1500–7800)
Neutrophils Relative %: 48 %
Platelets: 440 10*3/uL — ABNORMAL HIGH (ref 140–400)
RBC: 4.56 MIL/uL (ref 3.80–5.10)
RDW: 13.2 % (ref 11.0–15.0)
WBC: 7.9 10*3/uL (ref 3.8–10.8)

## 2016-06-19 MED ORDER — ROPINIROLE HCL 0.5 MG PO TABS: 0.5000 mg | ORAL_TABLET | Freq: Two times a day (BID) | ORAL | 2 refills | Status: DC

## 2016-06-19 MED ORDER — ZOLPIDEM TARTRATE 5 MG PO TABS: 5.0000 mg | ORAL_TABLET | Freq: Every evening | ORAL | 0 refills | Status: DC | PRN

## 2016-06-19 MED ORDER — CYCLOBENZAPRINE HCL 10 MG PO TABS: 10.0000 mg | ORAL_TABLET | Freq: Two times a day (BID) | ORAL | 0 refills | Status: DC | PRN

## 2016-06-19 NOTE — Assessment & Plan Note (Signed)
Tolerating statin, encouraged heart healthy diet, avoid trans fats, minimize simple carbs and saturated fats. Increase exercise as tolerated 
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
## 2016-06-19 NOTE — Patient Instructions (Signed)
[name] , Thank you for taking time to come for your Medicare Wellness Visit. I appreciate your ongoing commitment to your health goals. Please review the following plan we discussed and let me know if I can assist you in the future.   These are the goals we discussed: Goals    . Increase physical activity       This is a list of the screening recommended for you and due dates:  Health Maintenance  Topic Date Due  . Flu Shot  08/12/2016  . Mammogram  12/09/2017  . Pneumonia vaccines (2 of 2 - PPSV23) 03/16/2018  . Colon Cancer Screening  01/22/2019  . Tetanus Vaccine  09/21/2020  . DEXA scan (bone density measurement)  Completed  .  Hepatitis C: One time screening is recommended by Center for Disease Control  (CDC) for  adults born from [birth year] through 1965.   Completed   Continue to eat heart healthy diet (full of fruits, vegetables, whole grains, lean protein, water--limit salt, fat, and sugar intake) and increase physical activity as tolerated.  Continue doing brain stimulating activities (puzzles, reading, adult coloring books, staying active) to keep memory sharp.  Health Maintenance, Female Adopting a healthy lifestyle and getting preventive care can go a long way to promote health and wellness. Talk with your health care provider about what schedule of regular examinations is right for you. This is a good chance for you to check in with your provider about disease prevention and staying healthy. In between checkups, there are plenty of things you can do on your own. Experts have done a lot of research about which lifestyle changes and preventive measures are most likely to keep you healthy. Ask your health care provider for more information. Weight and diet Eat a healthy diet  Be sure to include plenty of vegetables, fruits, low-fat dairy products, and lean protein.  Do not eat a lot of foods high in solid fats, added sugars, or salt.  Get regular exercise. This is one of  the most important things you can do for your health. ? Most adults should exercise for at least 150 minutes each week. The exercise should increase your heart rate and make you sweat (moderate-intensity exercise). ? Most adults should also do strengthening exercises at least twice a week. This is in addition to the moderate-intensity exercise.  Maintain a healthy weight  Body mass index (BMI) is a measurement that can be used to identify possible weight problems. It estimates body fat based on height and weight. Your health care provider can help determine your BMI and help you achieve or maintain a healthy weight.  For females 26 years of age and older: ? A BMI below 18.5 is considered underweight. ? A BMI of 18.5 to 24.9 is normal. ? A BMI of 25 to 29.9 is considered overweight. ? A BMI of 30 and above is considered obese.  Watch levels of cholesterol and blood lipids  You should start having your blood tested for lipids and cholesterol at 66 years of age, then have this test every 5 years.  You may need to have your cholesterol levels checked more often if: ? Your lipid or cholesterol levels are high. ? You are older than 66 years of age. ? You are at high risk for heart disease.  Cancer screening Lung Cancer  Lung cancer screening is recommended for adults 3-61 years old who are at high risk for lung cancer because of a history of smoking.  A yearly low-dose CT scan of the lungs is recommended for people who: ? Currently smoke. ? Have quit within the past 15 years. ? Have at least a 30-pack-year history of smoking. A pack year is smoking an average of one pack of cigarettes a day for 1 year.  Yearly screening should continue until it has been 15 years since you quit.  Yearly screening should stop if you develop a health problem that would prevent you from having lung cancer treatment.  Breast Cancer  Practice breast self-awareness. This means understanding how your  breasts normally appear and feel.  It also means doing regular breast self-exams. Let your health care provider know about any changes, no matter how small.  If you are in your 20s or 30s, you should have a clinical breast exam (CBE) by a health care provider every 1-3 years as part of a regular health exam.  If you are 70 or older, have a CBE every year. Also consider having a breast X-ray (mammogram) every year.  If you have a family history of breast cancer, talk to your health care provider about genetic screening.  If you are at high risk for breast cancer, talk to your health care provider about having an MRI and a mammogram every year.  Breast cancer gene (BRCA) assessment is recommended for women who have family members with BRCA-related cancers. BRCA-related cancers include: ? Breast. ? Ovarian. ? Tubal. ? Peritoneal cancers.  Results of the assessment will determine the need for genetic counseling and BRCA1 and BRCA2 testing.  Cervical Cancer Your health care provider may recommend that you be screened regularly for cancer of the pelvic organs (ovaries, uterus, and vagina). This screening involves a pelvic examination, including checking for microscopic changes to the surface of your cervix (Pap test). You may be encouraged to have this screening done every 3 years, beginning at age 87.  For women ages 17-65, health care providers may recommend pelvic exams and Pap testing every 3 years, or they may recommend the Pap and pelvic exam, combined with testing for human papilloma virus (HPV), every 5 years. Some types of HPV increase your risk of cervical cancer. Testing for HPV may also be done on women of any age with unclear Pap test results.  Other health care providers may not recommend any screening for nonpregnant women who are considered low risk for pelvic cancer and who do not have symptoms. Ask your health care provider if a screening pelvic exam is right for you.  If you  have had past treatment for cervical cancer or a condition that could lead to cancer, you need Pap tests and screening for cancer for at least 20 years after your treatment. If Pap tests have been discontinued, your risk factors (such as having a new sexual partner) need to be reassessed to determine if screening should resume. Some women have medical problems that increase the chance of getting cervical cancer. In these cases, your health care provider may recommend more frequent screening and Pap tests.  Colorectal Cancer  This type of cancer can be detected and often prevented.  Routine colorectal cancer screening usually begins at 66 years of age and continues through 66 years of age.  Your health care provider may recommend screening at an earlier age if you have risk factors for colon cancer.  Your health care provider may also recommend using home test kits to check for hidden blood in the stool.  A small camera at the end  of a tube can be used to examine your colon directly (sigmoidoscopy or colonoscopy). This is done to check for the earliest forms of colorectal cancer.  Routine screening usually begins at age 3.  Direct examination of the colon should be repeated every 5-10 years through 66 years of age. However, you may need to be screened more often if early forms of precancerous polyps or small growths are found.  Skin Cancer  Check your skin from head to toe regularly.  Tell your health care provider about any new moles or changes in moles, especially if there is a change in a mole's shape or color.  Also tell your health care provider if you have a mole that is larger than the size of a pencil eraser.  Always use sunscreen. Apply sunscreen liberally and repeatedly throughout the day.  Protect yourself by wearing long sleeves, pants, a wide-brimmed hat, and sunglasses whenever you are outside.  Heart disease, diabetes, and high blood pressure  High blood pressure causes  heart disease and increases the risk of stroke. High blood pressure is more likely to develop in: ? People who have blood pressure in the high end of the normal range (130-139/85-89 mm Hg). ? People who are overweight or obese. ? People who are African American.  If you are 18-17 years of age, have your blood pressure checked every 3-5 years. If you are 38 years of age or older, have your blood pressure checked every year. You should have your blood pressure measured twice-once when you are at a hospital or clinic, and once when you are not at a hospital or clinic. Record the average of the two measurements. To check your blood pressure when you are not at a hospital or clinic, you can use: ? An automated blood pressure machine at a pharmacy. ? A home blood pressure monitor.  If you are between 49 years and 45 years old, ask your health care provider if you should take aspirin to prevent strokes.  Have regular diabetes screenings. This involves taking a blood sample to check your fasting blood sugar level. ? If you are at a normal weight and have a low risk for diabetes, have this test once every three years after 66 years of age. ? If you are overweight and have a high risk for diabetes, consider being tested at a younger age or more often. Preventing infection Hepatitis B  If you have a higher risk for hepatitis B, you should be screened for this virus. You are considered at high risk for hepatitis B if: ? You were born in a country where hepatitis B is common. Ask your health care provider which countries are considered high risk. ? Your parents were born in a high-risk country, and you have not been immunized against hepatitis B (hepatitis B vaccine). ? You have HIV or AIDS. ? You use needles to inject street drugs. ? You live with someone who has hepatitis B. ? You have had sex with someone who has hepatitis B. ? You get hemodialysis treatment. ? You take certain medicines for  conditions, including cancer, organ transplantation, and autoimmune conditions.  Hepatitis C  Blood testing is recommended for: ? Everyone born from [birth year] through 1965. ? Anyone with known risk factors for hepatitis C.  Sexually transmitted infections (STIs)  You should be screened for sexually transmitted infections (STIs) including gonorrhea and chlamydia if: ? You are sexually active and are younger than 67 years of age. ? You are  older than 66 years of age and your health care provider tells you that you are at risk for this type of infection. ? Your sexual activity has changed since you were last screened and you are at an increased risk for chlamydia or gonorrhea. Ask your health care provider if you are at risk.  If you do not have HIV, but are at risk, it may be recommended that you take a prescription medicine daily to prevent HIV infection. This is called pre-exposure prophylaxis (PrEP). You are considered at risk if: ? You are sexually active and do not regularly use condoms or know the HIV status of your partner(s). ? You take drugs by injection. ? You are sexually active with a partner who has HIV.  Talk with your health care provider about whether you are at high risk of being infected with HIV. If you choose to begin PrEP, you should first be tested for HIV. You should then be tested every 3 months for as long as you are taking PrEP. Pregnancy  If you are premenopausal and you may become pregnant, ask your health care provider about preconception counseling.  If you may become pregnant, take 400 to 800 micrograms (mcg) of folic acid every day.  If you want to prevent pregnancy, talk to your health care provider about birth control (contraception). Osteoporosis and menopause  Osteoporosis is a disease in which the bones lose minerals and strength with aging. This can result in serious bone fractures. Your risk for osteoporosis can be identified using a bone density  scan.  If you are 12 years of age or older, or if you are at risk for osteoporosis and fractures, ask your health care provider if you should be screened.  Ask your health care provider whether you should take a calcium or vitamin D supplement to lower your risk for osteoporosis.  Menopause may have certain physical symptoms and risks.  Hormone replacement therapy may reduce some of these symptoms and risks. Talk to your health care provider about whether hormone replacement therapy is right for you. Follow these instructions at home:  Schedule regular health, dental, and eye exams.  Stay current with your immunizations.  Do not use any tobacco products including cigarettes, chewing tobacco, or electronic cigarettes.  If you are pregnant, do not drink alcohol.  If you are breastfeeding, limit how much and how often you drink alcohol.  Limit alcohol intake to no more than 1 drink per day for nonpregnant women. One drink equals 12 ounces of beer, 5 ounces of wine, or 1 ounces of hard liquor.  Do not use street drugs.  Do not share needles.  Ask your health care provider for help if you need support or information about quitting drugs.  Tell your health care provider if you often feel depressed.  Tell your health care provider if you have ever been abused or do not feel safe at home. This information is not intended to replace advice given to you by your health care provider. Make sure you discuss any questions you have with your health care provider. Document Released: 07/14/2010 Document Revised: 06/06/2015 Document Reviewed: 10/02/2014 Elsevier Interactive Patient Education  [employer].

## 2016-06-20 LAB — COMPREHENSIVE METABOLIC PANEL
ALBUMIN: 4.5 g/dL (ref 3.6–5.1)
ALT: 55 U/L — ABNORMAL HIGH (ref 6–29)
AST: 46 U/L — ABNORMAL HIGH (ref 10–35)
Alkaline Phosphatase: 50 U/L (ref 33–130)
BUN: 24 mg/dL (ref 7–25)
CHLORIDE: 105 mmol/L (ref 98–110)
CO2: 23 mmol/L (ref 20–31)
CREATININE: 0.99 mg/dL (ref 0.50–0.99)
Calcium: 10 mg/dL (ref 8.6–10.4)
Glucose, Bld: 88 mg/dL (ref 65–99)
Potassium: 4.5 mmol/L (ref 3.5–5.3)
SODIUM: 140 mmol/L (ref 135–146)
TOTAL PROTEIN: 6.8 g/dL (ref 6.1–8.1)
Total Bilirubin: 0.5 mg/dL (ref 0.2–1.2)

## 2016-06-20 LAB — LIPID PANEL
CHOLESTEROL: 181 mg/dL (ref <200)
HDL: 60 mg/dL (ref >50)
LDL Cholesterol: 98 mg/dL (ref <100)
Total CHOL/HDL Ratio: 3 Ratio (ref <5.0)
Triglycerides: 117 mg/dL (ref <150)
VLDL: 23 mg/dL (ref <30)

## 2016-06-23 ENCOUNTER — Encounter: Payer: Self-pay | Admitting: Family Medicine

## 2016-06-23 DIAGNOSIS — Z79899 Other long term (current) drug therapy: Secondary | ICD-10-CM | POA: Diagnosis not present

## 2016-06-24 ENCOUNTER — Encounter: Payer: Self-pay | Admitting: Family Medicine

## 2016-06-24 DIAGNOSIS — R140 Abdominal distension (gaseous): Secondary | ICD-10-CM

## 2016-06-24 DIAGNOSIS — K5900 Constipation, unspecified: Secondary | ICD-10-CM

## 2016-06-26 ENCOUNTER — Other Ambulatory Visit: Payer: Self-pay | Admitting: Family Medicine

## 2016-06-26 DIAGNOSIS — R102 Pelvic and perineal pain: Secondary | ICD-10-CM

## 2016-06-26 DIAGNOSIS — R140 Abdominal distension (gaseous): Secondary | ICD-10-CM

## 2016-06-26 NOTE — Telephone Encounter (Signed)
GI referral placed, US abdomen/pelvis ordered. Pt informed via MyChart.

## 2016-06-26 NOTE — Telephone Encounter (Signed)
Gi referral for constipation [place] abd pelvis for abd /pelvic bloating

## 2016-06-26 NOTE — Progress Notes (Unsigned)
pelvi

## 2016-07-03 ENCOUNTER — Ambulatory Visit (HOSPITAL_BASED_OUTPATIENT_CLINIC_OR_DEPARTMENT_OTHER)
Admission: RE | Admit: 2016-07-03 | Discharge: 2016-07-03 | Disposition: A | Discharge status: Home / Self Care | Payer: Medicare HMO | Source: Ambulatory Visit | Attending: Family Medicine | Admitting: Family Medicine

## 2016-07-03 DIAGNOSIS — R102 Pelvic and perineal pain unspecified side: Secondary | ICD-10-CM

## 2016-07-03 DIAGNOSIS — R140 Abdominal distension (gaseous): Secondary | ICD-10-CM | POA: Diagnosis not present

## 2016-07-03 DIAGNOSIS — Z9889 Other specified postprocedural states: Secondary | ICD-10-CM | POA: Insufficient documentation

## 2016-07-03 DIAGNOSIS — Z9049 Acquired absence of other specified parts of digestive tract: Secondary | ICD-10-CM | POA: Diagnosis not present

## 2016-07-03 DIAGNOSIS — Z90710 Acquired absence of both cervix and uterus: Secondary | ICD-10-CM | POA: Diagnosis not present

## 2016-07-03 DIAGNOSIS — H2512 Age-related nuclear cataract, left eye: Secondary | ICD-10-CM | POA: Diagnosis not present

## 2016-07-03 DIAGNOSIS — R1909 Other intra-abdominal and pelvic swelling, mass and lump: Secondary | ICD-10-CM | POA: Diagnosis not present

## 2016-07-07 ENCOUNTER — Encounter: Payer: Self-pay | Admitting: Family Medicine

## 2016-07-13 ENCOUNTER — Encounter: Payer: Self-pay | Admitting: *Deleted

## 2016-07-21 ENCOUNTER — Ambulatory Visit
Admission: RE | Admit: 2016-07-21 | Discharge: 2016-07-21 | Disposition: A | Discharge status: Home / Self Care | Payer: Medicare HMO | Source: Ambulatory Visit | Attending: Ophthalmology | Admitting: Ophthalmology

## 2016-07-21 ENCOUNTER — Encounter
Admission: RE | Disposition: A | Discharge status: Home / Self Care | Payer: Self-pay | Source: Ambulatory Visit | Attending: Ophthalmology

## 2016-07-21 ENCOUNTER — Ambulatory Visit: Payer: Medicare HMO | Admitting: Anesthesiology

## 2016-07-21 ENCOUNTER — Encounter: Payer: Self-pay | Admitting: Anesthesiology

## 2016-07-21 DIAGNOSIS — F419 Anxiety disorder, unspecified: Secondary | ICD-10-CM | POA: Diagnosis not present

## 2016-07-21 DIAGNOSIS — G4730 Sleep apnea, unspecified: Secondary | ICD-10-CM | POA: Insufficient documentation

## 2016-07-21 DIAGNOSIS — K219 Gastro-esophageal reflux disease without esophagitis: Secondary | ICD-10-CM | POA: Diagnosis not present

## 2016-07-21 DIAGNOSIS — F329 Major depressive disorder, single episode, unspecified: Secondary | ICD-10-CM | POA: Diagnosis not present

## 2016-07-21 DIAGNOSIS — M797 Fibromyalgia: Secondary | ICD-10-CM | POA: Diagnosis not present

## 2016-07-21 DIAGNOSIS — H2512 Age-related nuclear cataract, left eye: Secondary | ICD-10-CM | POA: Diagnosis not present

## 2016-07-21 DIAGNOSIS — F418 Other specified anxiety disorders: Secondary | ICD-10-CM | POA: Diagnosis not present

## 2016-07-21 DIAGNOSIS — Z79899 Other long term (current) drug therapy: Secondary | ICD-10-CM | POA: Insufficient documentation

## 2016-07-21 HISTORY — PX: CATARACT EXTRACTION W/PHACO: SHX586

## 2016-07-21 HISTORY — DX: Anxiety disorder, unspecified: F41.9

## 2016-07-21 SURGERY — PHACOEMULSIFICATION, CATARACT, WITH IOL INSERTION
Anesthesia: Monitor Anesthesia Care | Site: Eye | Laterality: Left | Wound class: Clean

## 2016-07-21 MED ORDER — POLYMYXIN B-TRIMETHOPRIM 10000-0.1 UNIT/ML-% OP SOLN
OPHTHALMIC | Status: DC | PRN
  Administered 2016-07-21: 1 [drp]

## 2016-07-21 MED ORDER — ARMC OPHTHALMIC DILATING DROPS
OPHTHALMIC | Status: AC
  Administered 2016-07-21: 1 via OPHTHALMIC
  Filled 2016-07-21: qty 0.4

## 2016-07-21 MED ORDER — LIDOCAINE HCL (PF) 4 % IJ SOLN
INTRAOCULAR | Status: DC | PRN
  Administered 2016-07-21: 4 mL via OPHTHALMIC

## 2016-07-21 MED ORDER — ARMC OPHTHALMIC DILATING DROPS
1.0000 "application " | OPHTHALMIC | Status: AC
  Administered 2016-07-21 (×3): 1 via OPHTHALMIC

## 2016-07-21 MED ORDER — EPINEPHRINE PF 1 MG/ML IJ SOLN
INTRAOCULAR | Status: DC | PRN
  Administered 2016-07-21: 11:00:00 via OPHTHALMIC

## 2016-07-21 MED ORDER — MIDAZOLAM HCL 2 MG/2ML IJ SOLN
INTRAMUSCULAR | Status: DC | PRN
  Administered 2016-07-21: 2 mg via INTRAVENOUS

## 2016-07-21 MED ORDER — POLYMYXIN B-TRIMETHOPRIM 10000-0.1 UNIT/ML-% OP SOLN: 1.0000 [drp] | OPHTHALMIC | Status: DC | PRN

## 2016-07-21 MED ORDER — FENTANYL CITRATE (PF) 100 MCG/2ML IJ SOLN
INTRAMUSCULAR | Status: AC
  Filled 2016-07-21: qty 2

## 2016-07-21 MED ORDER — FENTANYL CITRATE (PF) 100 MCG/2ML IJ SOLN
INTRAMUSCULAR | Status: DC | PRN
  Administered 2016-07-21: 50 ug via INTRAVENOUS

## 2016-07-21 MED ORDER — CARBACHOL 0.01 % IO SOLN
INTRAOCULAR | Status: DC | PRN
  Administered 2016-07-21: 0.5 mL via INTRAOCULAR

## 2016-07-21 MED ORDER — MIDAZOLAM HCL 2 MG/2ML IJ SOLN
INTRAMUSCULAR | Status: AC
  Filled 2016-07-21: qty 2

## 2016-07-21 MED ORDER — POVIDONE-IODINE 5 % OP SOLN
OPHTHALMIC | Status: DC | PRN
  Administered 2016-07-21: 1 via OPHTHALMIC

## 2016-07-21 MED ORDER — SODIUM CHLORIDE 0.9 % IV SOLN
INTRAVENOUS | Status: DC
  Administered 2016-07-21: 10:00:00 via INTRAVENOUS

## 2016-07-21 MED ORDER — POLYMYXIN B-TRIMETHOPRIM 10000-0.1 UNIT/ML-% OP SOLN
OPHTHALMIC | Status: AC
  Filled 2016-07-21: qty 10

## 2016-07-21 MED ORDER — MOXIFLOXACIN HCL 0.5 % OP SOLN
OPHTHALMIC | Status: AC
  Filled 2016-07-21: qty 3

## 2016-07-21 MED ORDER — NA CHONDROIT SULF-NA HYALURON 40-17 MG/ML IO SOLN
INTRAOCULAR | Status: DC | PRN
Start: 2016-07-21 — End: 2016-07-21
  Administered 2016-07-21: 1 mL via INTRAOCULAR

## 2016-07-21 SURGICAL SUPPLY — 16 items
GLOVE BIO SURGEON STRL SZ8 (GLOVE) ×2 IMPLANT
GLOVE BIOGEL M 6.5 STRL (GLOVE) ×2 IMPLANT
GLOVE SURG LX 8.0 MICRO (GLOVE) ×1
GLOVE SURG LX STRL 8.0 MICRO (GLOVE) ×1 IMPLANT
GOWN STRL REUS W/ TWL LRG LVL3 (GOWN DISPOSABLE) ×2 IMPLANT
GOWN STRL REUS W/TWL LRG LVL3 (GOWN DISPOSABLE) ×4
LABEL CATARACT MEDS ST (LABEL) ×2 IMPLANT
LENS IOL TECNIS ITEC 31.0 (Intraocular Lens) ×1 IMPLANT
PACK CATARACT (MISCELLANEOUS) ×2 IMPLANT
PACK CATARACT BRASINGTON LX (MISCELLANEOUS) ×2 IMPLANT
PACK EYE AFTER SURG (MISCELLANEOUS) ×2 IMPLANT
SOL BSS BAG (MISCELLANEOUS) ×2
SOLUTION BSS BAG (MISCELLANEOUS) ×1 IMPLANT
SYR 5ML LL (SYRINGE) ×2 IMPLANT
WATER STERILE IRR 250ML POUR (IV SOLUTION) ×2 IMPLANT
WIPE NON LINTING 3.25X3.25 (MISCELLANEOUS) ×2 IMPLANT

## 2016-07-21 NOTE — Anesthesia Procedure Notes (Signed)
Procedure Name: MAC Date/Time: 07/21/2016 10:39 AM Performed by: Hedda Slade Pre-anesthesia Checklist: Patient identified, Emergency Drugs available, Suction available and Patient being monitored Oxygen Delivery Method: Nasal cannula
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
## 2016-07-21 NOTE — Anesthesia Post-op Follow-up Note (Cosign Needed)
Anesthesia QCDR form completed.        

## 2016-07-21 NOTE — Op Note (Signed)
PREOPERATIVE DIAGNOSIS:  Nuclear sclerotic cataract of the left eye.   POSTOPERATIVE DIAGNOSIS:  NUCLEAR SCLEROTIC CATARACT LEFT EYE   OPERATIVE PROCEDURE:  Procedure(s): CATARACT EXTRACTION PHACO AND INTRAOCULAR LENS PLACEMENT (IOC)   SURGEON:  Birder Robson, MD.   ANESTHESIA:   Anesthesiologist: Piscitello, Precious Haws, MD CRNA: Hedda Slade, CRNA  1.      Managed anesthesia care. 2.      Topical tetracaine drops followed by 2% Xylocaine jelly applied in the preoperative holding area.   COMPLICATIONS:  None.   TECHNIQUE:   Stop and chop   DESCRIPTION OF PROCEDURE:  The patient was examined and consented in the preoperative holding area where the aforementioned topical anesthesia was applied to the left eye and then brought back to the Operating Room where the left eye was prepped and draped in the usual sterile ophthalmic fashion and a lid speculum was placed. A paracentesis was created with the side port blade and the anterior chamber was filled with viscoelastic. A near clear corneal incision was performed with the steel keratome. A continuous curvilinear capsulorrhexis was performed with a cystotome followed by the capsulorrhexis forceps. Hydrodissection and hydrodelineation were carried out with BSS on a blunt cannula. The lens was removed in a stop and chop  technique and the remaining cortical material was removed with the irrigation-aspiration handpiece. The capsular bag was inflated with viscoelastic and the Technis [id number] lens was placed in the capsular bag without complication. The remaining viscoelastic was removed from the eye with the irrigation-aspiration handpiece. The wounds were hydrated. The anterior chamber was flushed with Miostat and the eye was inflated to physiologic pressure. . The wounds were found to be water tight. The eye was dressed with Polytrim. The patient was given protective glasses to wear throughout the day and a shield with which to sleep tonight. The  patient was also given drops with which to begin a drop regimen today and will follow-up with me in one day.  Implant Name Type Inv. Item Serial No. Manufacturer Lot No. LRB No. Used  LENS IOL DIOP 31.0 - [id number] Intraocular Lens LENS IOL DIOP 31.0 [id number] AMO   Left 1   Procedure(s) with comments: CATARACT EXTRACTION PHACO AND INTRAOCULAR LENS PLACEMENT (IOC) (Left) - [place] 00:32.3 AP% 12.2 CDE 3.93 Fluid pack lot # [id number]  Electronically signed: Lexandra Rettke LOUIS 07/21/2016 10:59 AM

## 2016-07-21 NOTE — H&P (Signed)
All labs reviewed. Abnormal studies sent to patients PCP when indicated.  Previous H&P reviewed, patient examined, there are NO CHANGES.  [name] LOUIS7/10/201810:32 AM

## 2016-07-21 NOTE — Transfer of Care (Signed)
Immediate Anesthesia Transfer of Care Note  Patient: [name]  Procedure(s) Performed: Procedure(s) with comments: CATARACT EXTRACTION PHACO AND INTRAOCULAR LENS PLACEMENT (IOC) (Left) - [place] 00:32.3 AP% 12.2 CDE 3.93 Fluid pack lot # [id number]  Patient Location: PACU  Anesthesia Type:MAC  Level of Consciousness: awake, alert  and oriented  Airway & Oxygen Therapy: Patient Spontanous Breathing  Post-op Assessment: Report given to RN and Post -op Vital signs reviewed and stable  Post vital signs: Reviewed and stable  Last Vitals:  Vitals:   07/21/16 0956 07/21/16 1100  BP:  106/61  Pulse: 72 65  Resp:    Temp: 36.8 C (!) 36.3 C    Last Pain:  Vitals:   07/21/16 1100  TempSrc: Temporal         Complications: No apparent anesthesia complications

## 2016-07-21 NOTE — Discharge Instructions (Signed)

## 2016-07-21 NOTE — Anesthesia Postprocedure Evaluation (Signed)
Anesthesia Post Note  Patient: [name]  Procedure(s) Performed: Procedure(s) (LRB): CATARACT EXTRACTION PHACO AND INTRAOCULAR LENS PLACEMENT (IOC) (Left)  Patient location during evaluation: PACU Anesthesia Type: MAC Level of consciousness: awake Pain management: pain level controlled Vital Signs Assessment: post-procedure vital signs reviewed and stable Respiratory status: spontaneous breathing Cardiovascular status: blood pressure returned to baseline Postop Assessment: no signs of nausea or vomiting Anesthetic complications: no     Last Vitals:  Vitals:   07/21/16 0956 07/21/16 1100  BP:  106/61  Pulse: 72 65  Resp:    Temp: 36.8 C (!) 36.3 C    Last Pain:  Vitals:   07/21/16 1100  TempSrc: Temporal                 [name]

## 2016-07-23 ENCOUNTER — Other Ambulatory Visit: Payer: Self-pay | Admitting: Family Medicine

## 2016-07-23 DIAGNOSIS — F411 Generalized anxiety disorder: Secondary | ICD-10-CM

## 2016-07-24 ENCOUNTER — Encounter: Payer: Self-pay | Admitting: Family Medicine

## 2016-07-24 DIAGNOSIS — G4700 Insomnia, unspecified: Secondary | ICD-10-CM

## 2016-07-24 MED ORDER — ZOLPIDEM TARTRATE 5 MG PO TABS: 5.0000 mg | ORAL_TABLET | Freq: Every evening | ORAL | 0 refills | Status: DC | PRN

## 2016-07-24 MED ORDER — CLONAZEPAM 1 MG PO TABS: 1.0000 mg | ORAL_TABLET | Freq: Every day | ORAL | 0 refills | Status: DC

## 2016-07-24 NOTE — Addendum Note (Signed)
Addended by: Sharon Seller B on: 07/24/2016 10:09 AM   Modules accepted: Orders

## 2016-07-24 NOTE — Telephone Encounter (Signed)
If it is time  Ok to refill  Need database if not already done

## 2016-08-03 ENCOUNTER — Encounter: Payer: Self-pay | Admitting: Family Medicine

## 2016-08-03 NOTE — Telephone Encounter (Signed)
What is she asking?

## 2016-08-04 NOTE — Telephone Encounter (Signed)
Pt wanting to know if you wanted to see her for drug testing or just lab appt?

## 2016-08-04 NOTE — Telephone Encounter (Signed)
Lab app is ok

## 2016-08-11 ENCOUNTER — Other Ambulatory Visit: Payer: Self-pay | Admitting: Pharmacist

## 2016-08-11 DIAGNOSIS — R1310 Dysphagia, unspecified: Secondary | ICD-10-CM | POA: Diagnosis not present

## 2016-08-11 DIAGNOSIS — K219 Gastro-esophageal reflux disease without esophagitis: Secondary | ICD-10-CM | POA: Diagnosis not present

## 2016-08-11 DIAGNOSIS — K449 Diaphragmatic hernia without obstruction or gangrene: Secondary | ICD-10-CM | POA: Diagnosis not present

## 2016-08-11 DIAGNOSIS — K5904 Chronic idiopathic constipation: Secondary | ICD-10-CM | POA: Diagnosis not present

## 2016-08-11 NOTE — Patient Outreach (Signed)
[facility]) Care Management  08/11/2016  [name] [birth year] [id number]   Patient called and asked that another [place] Patient Assistance Program Application be sent to her.  Patient was previously approved for Lyrica from [place] with Dr. Nelva Bush.  Lyrica goes on "Application Form D".  Although through the same company, Premarin is obtained using "Application Form A".      Plan: Refer patient to [place] to have [employer] application mailed to her.  [name], PharmD, [place] Clinical Pharmacist [phone]

## 2016-08-13 ENCOUNTER — Encounter: Payer: Self-pay | Admitting: Family Medicine

## 2016-08-13 DIAGNOSIS — E785 Hyperlipidemia, unspecified: Secondary | ICD-10-CM

## 2016-08-13 DIAGNOSIS — R748 Abnormal levels of other serum enzymes: Secondary | ICD-10-CM

## 2016-08-13 DIAGNOSIS — R7989 Other specified abnormal findings of blood chemistry: Secondary | ICD-10-CM

## 2016-09-08 ENCOUNTER — Emergency Department (HOSPITAL_COMMUNITY)
Admission: EM | Admit: 2016-09-08 | Discharge: 2016-09-08 | Disposition: A | Discharge status: Home / Self Care | Payer: Medicare HMO | Attending: Emergency Medicine | Admitting: Emergency Medicine

## 2016-09-08 ENCOUNTER — Encounter (HOSPITAL_COMMUNITY): Payer: Self-pay

## 2016-09-08 DIAGNOSIS — S0993XA Unspecified injury of face, initial encounter: Secondary | ICD-10-CM | POA: Diagnosis present

## 2016-09-08 DIAGNOSIS — Y929 Unspecified place or not applicable: Secondary | ICD-10-CM | POA: Diagnosis not present

## 2016-09-08 DIAGNOSIS — W5503XA Scratched by cat, initial encounter: Secondary | ICD-10-CM | POA: Diagnosis not present

## 2016-09-08 DIAGNOSIS — Z79899 Other long term (current) drug therapy: Secondary | ICD-10-CM | POA: Insufficient documentation

## 2016-09-08 DIAGNOSIS — S0081XA Abrasion of other part of head, initial encounter: Secondary | ICD-10-CM | POA: Diagnosis not present

## 2016-09-08 DIAGNOSIS — Z96653 Presence of artificial knee joint, bilateral: Secondary | ICD-10-CM | POA: Diagnosis not present

## 2016-09-08 DIAGNOSIS — Y939 Activity, unspecified: Secondary | ICD-10-CM | POA: Insufficient documentation

## 2016-09-08 DIAGNOSIS — S0123XA Puncture wound without foreign body of nose, initial encounter: Secondary | ICD-10-CM | POA: Diagnosis not present

## 2016-09-08 DIAGNOSIS — Y999 Unspecified external cause status: Secondary | ICD-10-CM | POA: Diagnosis not present

## 2016-09-08 MED ORDER — AMOXICILLIN-POT CLAVULANATE 875-125 MG PO TABS
1.0000 | ORAL_TABLET | Freq: Once | ORAL | Status: AC
  Administered 2016-09-08: 1 via ORAL
  Filled 2016-09-08: qty 1

## 2016-09-08 MED ORDER — AMOXICILLIN-POT CLAVULANATE 875-125 MG PO TABS: 1.0000 | ORAL_TABLET | Freq: Two times a day (BID) | ORAL | 0 refills | Status: AC
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
## 2016-09-08 NOTE — ED Triage Notes (Signed)
Patient complains of scratch/punture to left side of nose after cat walked across her this am. No bleeding on arrival, NAD. No eye injury

## 2016-09-08 NOTE — Discharge Instructions (Signed)
Please take Augmentin, an antibiotic, once every 12 hours for the next 5 days. Please note this medicine can cause your stomach to be upset and can sometimes cause diarrhea. It is important that he take all of the tablets and do not stop taking the medication even if you feel better.  Please keep the wound clean by washing it with warm soap and water at least once a day. You may cover with bandage if you're concerned about dirt or other debris getting into the wound. Please do not submerge your face and water until the wound has healed. You may apply a topical antibiotic such as bacitracin or Neosporin to the wound daily.  Please note that gravity, and pull blood down under the eye and her eye may look more black in the next couple of days. This can be normal.   If you develop new or worsening symptoms, including fever, chills, visual changes, or if the skin around the wound, becomes red, hot, or swollen, please return to the emergency department for reevaluation.

## 2016-09-08 NOTE — ED Notes (Signed)
Pt's cat scratched pt on left inner corner near eye-- bleeding controlled, pt is a Programme researcher, broadcasting/film/video-- recent cataract surgery.

## 2016-09-16 ENCOUNTER — Encounter: Payer: Self-pay | Admitting: Family Medicine

## 2016-09-16 MED ORDER — OMEPRAZOLE 40 MG PO CPDR: 40.0000 mg | DELAYED_RELEASE_CAPSULE | Freq: Every day | ORAL | 1 refills | Status: DC

## 2016-09-16 MED ORDER — CYCLOBENZAPRINE HCL 10 MG PO TABS: 10.0000 mg | ORAL_TABLET | Freq: Two times a day (BID) | ORAL | 0 refills | Status: DC | PRN

## 2016-09-18 ENCOUNTER — Telehealth: Payer: Self-pay

## 2016-09-18 NOTE — Telephone Encounter (Signed)
PA initiated via Covermymeds; KEY: [id number]. Received PA approval through 09/18/2018.

## 2016-10-01 ENCOUNTER — Other Ambulatory Visit (INDEPENDENT_AMBULATORY_CARE_PROVIDER_SITE_OTHER): Payer: Medicare HMO

## 2016-10-01 ENCOUNTER — Other Ambulatory Visit: Payer: Self-pay | Admitting: Pharmacist

## 2016-10-01 DIAGNOSIS — R7989 Other specified abnormal findings of blood chemistry: Secondary | ICD-10-CM

## 2016-10-01 DIAGNOSIS — R748 Abnormal levels of other serum enzymes: Secondary | ICD-10-CM

## 2016-10-01 DIAGNOSIS — E785 Hyperlipidemia, unspecified: Secondary | ICD-10-CM

## 2016-10-01 NOTE — Patient Outreach (Signed)
[facility]) Care Management  10/01/2016  [name] [birth year] [id number]   Patient called requesting the [place] forms be resent to her.HIPAA identifiers were obtained. According to the notes in the patient chart, the application was mailed to the patient on 08/11/2016.  Crystal Walker, CPhT will re-mailed the forms to the patient 09/30/2016.  Patient was also provided the link to the application via email per her request as no HIPAA or medical information was contained in the email.   Per patient--she will handle completion of the form and take it to her provider.   [name], PharmD, [place] Clinical Pharmacist [phone]

## 2016-10-02 LAB — CBC WITH DIFFERENTIAL/PLATELET
BASOS PCT: 0.8 % (ref 0.0–3.0)
Basophils Absolute: 0.1 10*3/uL (ref 0.0–0.1)
EOS PCT: 2.2 % (ref 0.0–5.0)
Eosinophils Absolute: 0.2 10*3/uL (ref 0.0–0.7)
HEMATOCRIT: 44.6 % (ref 36.0–46.0)
HEMOGLOBIN: 14.3 g/dL (ref 12.0–15.0)
LYMPHS PCT: 33.6 % (ref 12.0–46.0)
Lymphs Abs: 2.8 10*3/uL (ref 0.7–4.0)
MCHC: 32.1 g/dL (ref 30.0–36.0)
MCV: 87.9 fl (ref 78.0–100.0)
Monocytes Absolute: 0.6 10*3/uL (ref 0.1–1.0)
Monocytes Relative: 7.1 % (ref 3.0–12.0)
NEUTROS ABS: 4.7 10*3/uL (ref 1.4–7.7)
Neutrophils Relative %: 56.3 % (ref 43.0–77.0)
PLATELETS: 393 10*3/uL (ref 150.0–400.0)
RBC: 5.07 Mil/uL (ref 3.87–5.11)
RDW: 13 % (ref 11.5–15.5)
WBC: 8.4 10*3/uL (ref 4.0–10.5)

## 2016-10-02 LAB — COMPREHENSIVE METABOLIC PANEL
ALT: 28 U/L (ref 0–35)
AST: 26 U/L (ref 0–37)
Albumin: 4.4 g/dL (ref 3.5–5.2)
Alkaline Phosphatase: 66 U/L (ref 39–117)
BUN: 16 mg/dL (ref 6–23)
CALCIUM: 10.1 mg/dL (ref 8.4–10.5)
CHLORIDE: 103 meq/L (ref 96–112)
CO2: 26 meq/L (ref 19–32)
CREATININE: 0.76 mg/dL (ref 0.40–1.20)
GFR: 80.75 mL/min (ref >60.00)
Glucose, Bld: 96 mg/dL (ref 70–99)
POTASSIUM: 4.1 meq/L (ref 3.5–5.1)
SODIUM: 137 meq/L (ref 135–145)
Total Bilirubin: 0.3 mg/dL (ref 0.2–1.2)
Total Protein: 7.2 g/dL (ref 6.0–8.3)

## 2016-10-02 LAB — LIPID PANEL
CHOL/HDL RATIO: 5
Cholesterol: 228 mg/dL — ABNORMAL HIGH (ref 0–200)
HDL: 48.8 mg/dL (ref >39.00)
NONHDL: 179.37
TRIGLYCERIDES: 202 mg/dL — AB (ref 0.0–149.0)
VLDL: 40.4 mg/dL — ABNORMAL HIGH (ref 0.0–40.0)

## 2016-10-02 LAB — LDL CHOLESTEROL, DIRECT: LDL DIRECT: 131 mg/dL

## 2016-10-04 ENCOUNTER — Telehealth: Payer: Self-pay | Admitting: Family Medicine

## 2016-10-04 ENCOUNTER — Encounter: Payer: Self-pay | Admitting: Family Medicine

## 2016-10-04 DIAGNOSIS — G4700 Insomnia, unspecified: Secondary | ICD-10-CM

## 2016-10-05 ENCOUNTER — Other Ambulatory Visit: Payer: Self-pay | Admitting: Family Medicine

## 2016-10-05 DIAGNOSIS — E785 Hyperlipidemia, unspecified: Secondary | ICD-10-CM

## 2016-10-05 NOTE — Telephone Encounter (Signed)
Pt is requesting refill on Ambien.  Last OV: 06/19/2016  Last Fill: 07/24/2016 #30 and 0RF UDS: 06/19/2016 Low risk  NCCR printed; placed on ledge for tomorrow.

## 2016-10-06 ENCOUNTER — Other Ambulatory Visit: Payer: Self-pay | Admitting: *Deleted

## 2016-10-06 DIAGNOSIS — G4700 Insomnia, unspecified: Secondary | ICD-10-CM

## 2016-10-06 MED ORDER — ZOLPIDEM TARTRATE 5 MG PO TABS: 5.0000 mg | ORAL_TABLET | Freq: Every evening | ORAL | 1 refills | Status: DC | PRN

## 2016-10-06 NOTE — Telephone Encounter (Signed)
Refill x1  2 refill 

## 2016-10-06 NOTE — Telephone Encounter (Signed)
Done; faxed

## 2016-10-07 ENCOUNTER — Encounter: Payer: Self-pay | Admitting: Family Medicine

## 2016-10-17 ENCOUNTER — Encounter: Payer: Self-pay | Admitting: Family Medicine

## 2016-10-20 NOTE — Telephone Encounter (Signed)
Ok to give new temporary handicap

## 2016-10-21 ENCOUNTER — Encounter: Payer: Self-pay | Admitting: Family Medicine

## 2016-10-21 ENCOUNTER — Other Ambulatory Visit: Payer: Self-pay | Admitting: Family Medicine

## 2016-10-21 DIAGNOSIS — F411 Generalized anxiety disorder: Secondary | ICD-10-CM

## 2016-10-21 NOTE — Telephone Encounter (Signed)
[place], [place] [phone] (Phone) [phone] (Fax)   Pharmacy wanted to inform PCP patient is receiving HYDROcodone-acetaminophen (NORCO/VICODIN) 5-325 MG per tablet from a specialist and wanted to make PCP aware before filling clonazePAM (KLONOPIN) 1 MG tablet, please advise pharmacy directly

## 2016-10-21 NOTE — Telephone Encounter (Signed)
Pt is requesting refill on clonazepam 1mg  and Flexeril 10mg  (does she use this chronically)?  Last OV: 06/19/2016, next appt scheduled 06/22/2017 Last Fill on clonazepam: 07/24/2016 #90 and 0RF Last Fill on Flexeril: 09/16/2016 #60 and 0RF UDS: 06/19/2016 Low risk   NCCR printed; placed on ledge for review on PCP return to office.  Please advise.

## 2016-10-21 NOTE — Telephone Encounter (Signed)
FYI

## 2016-10-22 MED ORDER — CYCLOBENZAPRINE HCL 10 MG PO TABS: 10.0000 mg | ORAL_TABLET | Freq: Two times a day (BID) | ORAL | 1 refills | Status: DC | PRN

## 2016-10-22 MED ORDER — ESCITALOPRAM OXALATE 20 MG PO TABS: 20.0000 mg | ORAL_TABLET | Freq: Every day | ORAL | 0 refills | Status: DC

## 2016-10-22 NOTE — Telephone Encounter (Signed)
I know -- she can not take both --- black box warning that is why pharmacy is calling us

## 2016-10-22 NOTE — Telephone Encounter (Signed)
MyChart message sent to Pt.

## 2016-10-22 NOTE — Telephone Encounter (Signed)
Let pt know we can not give her klonopin with hydrocodone -- we will need to wean off

## 2016-10-22 NOTE — Telephone Encounter (Signed)
[facility] is giving Pt Hydrocodone, you are prescribing the Klonopin.

## 2016-10-26 NOTE — Telephone Encounter (Signed)
If its not working don't take it --- tramadol is not good either.  Talk to dr Nelva Bush about options We can refill klonopin

## 2016-10-26 NOTE — Telephone Encounter (Signed)
  See Pt message below:    I see Dr Nelva Bush thursday to setup for an injection. Is tramadol ok instead of Vicodin? If so ill just go back to it. I'm taking such a small dose of Vicodin it's not helping anyway. I have to rotate Aleve and Tylenol, ice and heat. I'm trying to work to much so go figure. Laying around makes it worse so I prefer to stay active.

## 2016-10-29 ENCOUNTER — Other Ambulatory Visit: Payer: Self-pay | Admitting: Family Medicine

## 2016-10-29 ENCOUNTER — Encounter: Payer: Self-pay | Admitting: Family Medicine

## 2016-10-29 DIAGNOSIS — M533 Sacrococcygeal disorders, not elsewhere classified: Secondary | ICD-10-CM | POA: Diagnosis not present

## 2016-10-29 DIAGNOSIS — F411 Generalized anxiety disorder: Secondary | ICD-10-CM

## 2016-10-29 DIAGNOSIS — M47816 Spondylosis without myelopathy or radiculopathy, lumbar region: Secondary | ICD-10-CM | POA: Diagnosis not present

## 2016-10-29 DIAGNOSIS — G894 Chronic pain syndrome: Secondary | ICD-10-CM | POA: Diagnosis not present

## 2016-10-30 MED ORDER — CLONAZEPAM 1 MG PO TABS: 1.0000 mg | ORAL_TABLET | Freq: Every evening | ORAL | 0 refills | Status: DC | PRN

## 2016-10-30 NOTE — Telephone Encounter (Signed)
I did not tell her to stop the med cold [place]-- I said we would wean her off of it.  Start by taking 0.5 mg at night but there is a black box warning on the hydrocodone in combo with klonopin.  Pharmacies have been sending letter to drs writing both  Most pain management offices will not write them either.

## 2016-10-30 NOTE — Telephone Encounter (Signed)
Let give her #30 to take 1 a day until injection and then try to decrease--- is she still taking the hydrocodone-- she told us it really was not working.

## 2016-10-30 NOTE — Telephone Encounter (Signed)
I did not say to stop klonopin cold [place]-- I said we would wean her off of it.  There is a black box warning about taking the 2 together and pharmacies have started sending [place] messages about it so I

## 2016-10-30 NOTE — Telephone Encounter (Signed)
I did not say to stop klonopin cold [place] -- I said we would wean her off---- It is not ok to take both together ----there is a black box warning on the oxy

## 2016-11-04 DIAGNOSIS — M47816 Spondylosis without myelopathy or radiculopathy, lumbar region: Secondary | ICD-10-CM | POA: Diagnosis not present

## 2016-11-04 DIAGNOSIS — M533 Sacrococcygeal disorders, not elsewhere classified: Secondary | ICD-10-CM | POA: Diagnosis not present

## 2016-11-16 ENCOUNTER — Encounter: Payer: Self-pay | Admitting: Family Medicine

## 2016-11-19 ENCOUNTER — Encounter: Payer: Self-pay | Admitting: Family Medicine

## 2016-11-25 ENCOUNTER — Encounter: Payer: Self-pay | Admitting: Family Medicine

## 2016-11-26 ENCOUNTER — Encounter: Payer: Self-pay | Admitting: Family Medicine

## 2016-12-02 NOTE — Telephone Encounter (Signed)
Patient requesting refill for clonazepam  Database on your desk for review  Hydrocodone filled on 11/16/16 by Dr Nelva Bush Juluis Rainier)  Last filled per database: 10/31/16 Last written: 10/30/16 Last ov: 06/19/16 Next ov: none Contract: 06/19/16 UDS: due 12/19/16

## 2016-12-04 NOTE — Telephone Encounter (Signed)
We can not do klonopin and oxy together ---

## 2016-12-07 NOTE — Telephone Encounter (Signed)
We CAN NOT give klonopin and oxy together -- if she is taking 1 mg daily --- cut to 0.5 mg daily for 1 month---  Then next month #15  Then stop Or she can see psych but we will not be able to cont to write it

## 2016-12-17 NOTE — Telephone Encounter (Signed)
noted 

## 2016-12-28 ENCOUNTER — Encounter: Payer: Self-pay | Admitting: Family Medicine

## 2016-12-31 NOTE — Telephone Encounter (Signed)
She needs to see a Dr

## 2017-01-06 DIAGNOSIS — R05 Cough: Secondary | ICD-10-CM | POA: Diagnosis not present

## 2017-01-06 DIAGNOSIS — Z6834 Body mass index (BMI) 34.0-34.9, adult: Secondary | ICD-10-CM | POA: Diagnosis not present

## 2017-01-06 DIAGNOSIS — J019 Acute sinusitis, unspecified: Secondary | ICD-10-CM | POA: Diagnosis not present
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
## 2017-01-11 DIAGNOSIS — D72829 Elevated white blood cell count, unspecified: Secondary | ICD-10-CM | POA: Diagnosis not present

## 2017-01-11 DIAGNOSIS — R61 Generalized hyperhidrosis: Secondary | ICD-10-CM | POA: Diagnosis not present

## 2017-01-11 DIAGNOSIS — Z6834 Body mass index (BMI) 34.0-34.9, adult: Secondary | ICD-10-CM | POA: Diagnosis not present

## 2017-01-18 ENCOUNTER — Encounter: Payer: Self-pay | Admitting: Family Medicine

## 2017-01-19 ENCOUNTER — Encounter: Payer: Self-pay | Admitting: Family Medicine

## 2017-01-23 ENCOUNTER — Encounter: Payer: Self-pay | Admitting: Family Medicine

## 2017-01-24 ENCOUNTER — Encounter: Payer: Self-pay | Admitting: Family Medicine

## 2017-01-26 ENCOUNTER — Encounter: Payer: Self-pay | Admitting: Family Medicine

## 2017-01-26 NOTE — Telephone Encounter (Signed)
See other mychart message.

## 2017-01-26 NOTE — Telephone Encounter (Signed)
See my chart message

## 2017-02-02 DIAGNOSIS — K5900 Constipation, unspecified: Secondary | ICD-10-CM | POA: Diagnosis not present

## 2017-02-02 DIAGNOSIS — R1010 Upper abdominal pain, unspecified: Secondary | ICD-10-CM | POA: Diagnosis not present

## 2017-02-02 DIAGNOSIS — Z6834 Body mass index (BMI) 34.0-34.9, adult: Secondary | ICD-10-CM | POA: Diagnosis not present

## 2017-02-07 ENCOUNTER — Encounter: Payer: Self-pay | Admitting: Family Medicine

## 2017-02-08 ENCOUNTER — Telehealth: Payer: Self-pay | Admitting: Medical

## 2017-02-08 ENCOUNTER — Encounter: Payer: Self-pay | Admitting: Medical

## 2017-02-08 ENCOUNTER — Ambulatory Visit (INDEPENDENT_AMBULATORY_CARE_PROVIDER_SITE_OTHER): Payer: Medicare HMO | Admitting: Medical

## 2017-02-08 VITALS — BP 126/64 | HR 89 | Temp 98.4°F | Resp 16 | Ht <= 58 in | Wt 153.8 lb

## 2017-02-08 DIAGNOSIS — T782XXA Anaphylactic shock, unspecified, initial encounter: Secondary | ICD-10-CM | POA: Diagnosis not present

## 2017-02-08 DIAGNOSIS — D72829 Elevated white blood cell count, unspecified: Secondary | ICD-10-CM | POA: Diagnosis not present

## 2017-02-08 DIAGNOSIS — G4700 Insomnia, unspecified: Secondary | ICD-10-CM

## 2017-02-08 DIAGNOSIS — F411 Generalized anxiety disorder: Secondary | ICD-10-CM

## 2017-02-08 MED ORDER — CLONAZEPAM 1 MG PO TABS: 1.0000 mg | ORAL_TABLET | Freq: Every day | ORAL | 0 refills | Status: DC

## 2017-02-08 MED ORDER — ALENDRONATE SODIUM 70 MG PO TABS: ORAL_TABLET | ORAL | 3 refills | Status: DC

## 2017-02-08 MED ORDER — EPINEPHRINE 0.3 MG/0.3ML IJ SOAJ: INTRAMUSCULAR | 0 refills | Status: DC
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
## 2017-02-08 NOTE — Patient Instructions (Addendum)
For your history of anxiety and insomnia, I am prescribing you the clonazepam.  For further refills I asked that you call and request refills from your PCP.  She is not here today so I am giving you limited supply.  Also I am not  prescribing Ambien as I think it would be better for you to use clonazepam only rather than both.  For leukocytosis please attend the hematologist appointment.  For history of anaphylactic reaction, I am prescribing refilled your EpiPen's.  Also sent in refill for your Fosamax.  Follow-up in 6-8 weeks x-ray be scheduled with your PCP or as needed.

## 2017-02-08 NOTE — Telephone Encounter (Signed)
Did have multiple complaints and needed refills of various medications.  After hours I saw that he the chief complaint mentioned she needed a UDS.  Would you call her and advised she needs to come in given sample.

## 2017-02-09 ENCOUNTER — Telehealth: Payer: Self-pay | Admitting: Family Medicine

## 2017-02-09 ENCOUNTER — Encounter: Payer: Self-pay | Admitting: Family Medicine

## 2017-02-09 NOTE — Telephone Encounter (Signed)
Left pt a message to call back. 

## 2017-02-09 NOTE — Telephone Encounter (Signed)
[name] from [place] in [place] calling to verify clonazepam rx.

## 2017-02-10 ENCOUNTER — Encounter: Payer: Self-pay | Admitting: Family Medicine

## 2017-02-10 DIAGNOSIS — D72829 Elevated white blood cell count, unspecified: Secondary | ICD-10-CM | POA: Diagnosis not present

## 2017-02-10 DIAGNOSIS — D7589 Other specified diseases of blood and blood-forming organs: Secondary | ICD-10-CM | POA: Diagnosis not present

## 2017-02-10 DIAGNOSIS — Z1379 Encounter for other screening for genetic and chromosomal anomalies: Secondary | ICD-10-CM | POA: Diagnosis not present

## 2017-02-10 DIAGNOSIS — R718 Other abnormality of red blood cells: Secondary | ICD-10-CM | POA: Diagnosis not present

## 2017-02-10 DIAGNOSIS — D473 Essential (hemorrhagic) thrombocythemia: Secondary | ICD-10-CM | POA: Diagnosis not present

## 2017-02-10 NOTE — Telephone Encounter (Signed)
[facility] - Pharmacist in [place] [phone] called in to make provider aware that they are noticing a lot of red flags with pt and her Rx. She said that they are probably not going to refill her Rx due to their concern. They feel that the pt is "provider hopping" and "pharmacy hopping" to receive medications.    Okay to call back if provider would like to discuss further.

## 2017-02-11 NOTE — Telephone Encounter (Signed)
Done

## 2017-02-11 NOTE — Telephone Encounter (Signed)
Can you call them and see what the problem is---- I'm fine with them not filling it  We can not see the database in [place]--- so I would like to know how many drs she is seeing

## 2017-02-11 NOTE — Telephone Encounter (Signed)
Please go ahead and call that pharmacy and have them not filled the clonazepam.  I am going to document that it was discontinued in epic.  Would you write a note stating that you call them and was successful communicating that

## 2017-02-11 NOTE — Telephone Encounter (Signed)
Pharmacy had red flags stating that she taking tramadol and lyrica.  It is an interaction between all of that.  Patient stated that in an email on 12/14/17 that she was not taking clonazepam.  She was notified about taking pain meds and benzos together.  Per [name] they should not fill the clonazepam and I advised them that they should call Dr. Nelva Bush office.

## 2017-02-12 ENCOUNTER — Encounter: Payer: Self-pay | Admitting: Family Medicine

## 2017-02-12 NOTE — Telephone Encounter (Signed)
See other email

## 2017-02-12 NOTE — Telephone Encounter (Signed)
There is a problem with tramadol as well---- you are also only supposed to go to one pharmacy so going to two is against contract.  Have you moved to [place]?  If so you really need to have a pcp there.

## 2017-02-13 ENCOUNTER — Encounter: Payer: Self-pay | Admitting: Family Medicine

## 2017-02-15 ENCOUNTER — Encounter: Payer: Self-pay | Admitting: Family Medicine

## 2017-02-16 NOTE — Telephone Encounter (Signed)
I need someone to call her on the phone and explain  She is not to post urgent notes on my chart she should call the office  see my chart messages

## 2017-02-18 ENCOUNTER — Encounter: Payer: Self-pay | Admitting: Family Medicine

## 2017-02-19 ENCOUNTER — Encounter: Payer: Self-pay | Admitting: Family Medicine

## 2017-02-19 ENCOUNTER — Other Ambulatory Visit: Payer: Self-pay | Admitting: Family Medicine

## 2017-02-19 NOTE — Telephone Encounter (Signed)
PT called to check on status of this,  She is frustrated because she has been waiting to hear something. PT states she needs call back this morning.

## 2017-02-19 NOTE — Telephone Encounter (Signed)
We have been trying to call her We can not do klonopin and tramadol together and def not with ambien too--- we discussed this with her many times I did not know anything about a dog --- would need more info We still need uds

## 2017-02-20 ENCOUNTER — Encounter: Payer: Self-pay | Admitting: Family Medicine

## 2017-02-22 ENCOUNTER — Encounter: Payer: Self-pay | Admitting: Family Medicine

## 2017-02-22 ENCOUNTER — Telehealth: Payer: Self-pay | Admitting: Family Medicine

## 2017-02-22 DIAGNOSIS — D72829 Elevated white blood cell count, unspecified: Secondary | ICD-10-CM | POA: Diagnosis not present

## 2017-02-22 NOTE — Telephone Encounter (Signed)
Spoke with patient to get clarification on her mychart messages...  Patient is requesting letter stating her dog is her "support/companion" dog so that she can stay with her on the plane and will also help cut costs for air fair and apartment living.   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 Both letters can be emailed to her at [email or web address]  She is only using the [facility] in [place] currently, until she moves again.   Reports she is not currently taking Vicodin only Tramadol once daily   She will be home next week and will make an appointment for UDS so she can get further refills

## 2017-02-23 MED ORDER — ROPINIROLE HCL 0.5 MG PO TABS: ORAL_TABLET | ORAL | 2 refills | Status: DC

## 2017-02-23 NOTE — Telephone Encounter (Signed)
Letters scanned and emailed to below address. Also spoke with Judson Roch at [facility] and they do have the refills on file and will get those ready for the patient to pick up.  Made Miss Schoenfelder aware of both

## 2017-02-23 NOTE — Telephone Encounter (Signed)
Notes given to [place].  Requip sent to pharmacy

## 2017-02-24 NOTE — Telephone Encounter (Signed)
Not sure if she said anything about this yesterday when you spoke with her?

## 2017-02-25 NOTE — Telephone Encounter (Signed)
We discussed this before --- she is on too many controlled substances and she can not take all of them --- she can choose one  She also can not go to so many different pharmacies

## 2017-03-02 ENCOUNTER — Ambulatory Visit: Payer: Self-pay | Admitting: Family Medicine

## 2017-03-09 ENCOUNTER — Ambulatory Visit (INDEPENDENT_AMBULATORY_CARE_PROVIDER_SITE_OTHER): Payer: Medicare HMO | Admitting: Family Medicine

## 2017-03-09 ENCOUNTER — Encounter: Payer: Self-pay | Admitting: Family Medicine

## 2017-03-09 VITALS — BP 120/86 | HR 90 | Temp 98.7°F | Resp 16 | Ht <= 58 in | Wt 158.8 lb

## 2017-03-09 DIAGNOSIS — Z79899 Other long term (current) drug therapy: Secondary | ICD-10-CM | POA: Diagnosis not present

## 2017-03-09 DIAGNOSIS — E785 Hyperlipidemia, unspecified: Secondary | ICD-10-CM | POA: Diagnosis not present

## 2017-03-09 DIAGNOSIS — K219 Gastro-esophageal reflux disease without esophagitis: Secondary | ICD-10-CM | POA: Diagnosis not present

## 2017-03-09 DIAGNOSIS — N898 Other specified noninflammatory disorders of vagina: Secondary | ICD-10-CM | POA: Diagnosis not present

## 2017-03-09 DIAGNOSIS — G4700 Insomnia, unspecified: Secondary | ICD-10-CM

## 2017-03-09 MED ORDER — FENOFIBRATE 160 MG PO TABS: 160.0000 mg | ORAL_TABLET | Freq: Every day | ORAL | 1 refills | Status: DC

## 2017-03-09 MED ORDER — CLONAZEPAM 0.5 MG PO TABS: 0.5000 mg | ORAL_TABLET | Freq: Every day | ORAL | 1 refills | Status: DC

## 2017-03-09 MED ORDER — ESTROGENS, CONJUGATED 0.625 MG/GM VA CREA: TOPICAL_CREAM | VAGINAL | 3 refills | Status: DC

## 2017-03-09 NOTE — Assessment & Plan Note (Signed)
Encouraged heart healthy diet, increase exercise, avoid trans fats, consider a krill oil cap daily 
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
## 2017-03-09 NOTE — Assessment & Plan Note (Signed)
Lower dose klonopin to 0.5 mg qhs prn  D/w pt importance of taking as little as possible esp with tramadol

## 2017-03-09 NOTE — Patient Instructions (Signed)

## 2017-03-10 ENCOUNTER — Telehealth: Payer: Self-pay | Admitting: Family Medicine

## 2017-03-10 NOTE — Telephone Encounter (Signed)
Routing to provider  

## 2017-03-10 NOTE — Telephone Encounter (Signed)
Copied from [place]. Topic: Quick Communication - Rx Refill/Question >> Mar 10, 2017 10:01 AM Margot Ables wrote: Calling to alert provider. Pt was prescribed clonazepam today by Dr. [name]. Pt also has ambien on her med list from Dr. [name]. Pt is prescribed Tramadol and Lyrica by Dr. Nelva Bush. Please call to notify pharmacy if ok to fill clonazepam.

## 2017-03-11 ENCOUNTER — Encounter: Payer: Self-pay | Admitting: Family Medicine

## 2017-03-11 ENCOUNTER — Other Ambulatory Visit: Payer: Self-pay | Admitting: *Deleted

## 2017-03-11 DIAGNOSIS — I10 Essential (primary) hypertension: Secondary | ICD-10-CM

## 2017-03-11 LAB — PAIN MGMT, PROFILE 8 W/CONF, U
6 Acetylmorphine: NEGATIVE ng/mL (ref <10)
AMPHETAMINES: NEGATIVE ng/mL (ref <500)
Alcohol Metabolites: NEGATIVE ng/mL (ref <500)
BENZODIAZEPINES: NEGATIVE ng/mL (ref <100)
BUPRENORPHINE, URINE: NEGATIVE ng/mL (ref <5)
COCAINE METABOLITE: NEGATIVE ng/mL (ref <150)
CREATININE: 35.9 mg/dL
MDMA: NEGATIVE ng/mL (ref <500)
Marijuana Metabolite: NEGATIVE ng/mL (ref <20)
OXIDANT: NEGATIVE ug/mL (ref <200)
OXYCODONE: NEGATIVE ng/mL (ref <100)
Opiates: NEGATIVE ng/mL (ref <100)
pH: 6.37 (ref 4.5–9.0)

## 2017-03-11 MED ORDER — HYDROCHLOROTHIAZIDE 25 MG PO TABS: 25.0000 mg | ORAL_TABLET | Freq: Every day | ORAL | 0 refills | Status: DC

## 2017-03-11 NOTE — Telephone Encounter (Signed)
Pharmacy notified that ok to fill clonazepam

## 2017-03-16 ENCOUNTER — Other Ambulatory Visit: Payer: Self-pay | Admitting: Family Medicine

## 2017-03-16 ENCOUNTER — Encounter: Payer: Self-pay | Admitting: Family Medicine

## 2017-03-16 DIAGNOSIS — I10 Essential (primary) hypertension: Secondary | ICD-10-CM

## 2017-03-16 MED ORDER — HYDROCHLOROTHIAZIDE 25 MG PO TABS: 25.0000 mg | ORAL_TABLET | Freq: Every day | ORAL | 1 refills | Status: DC

## 2017-03-16 MED ORDER — ESCITALOPRAM OXALATE 20 MG PO TABS: 20.0000 mg | ORAL_TABLET | Freq: Every day | ORAL | 1 refills | Status: DC

## 2017-03-16 NOTE — Telephone Encounter (Signed)
She probably should be seen wherever she is now --may need imaging

## 2017-03-22 ENCOUNTER — Encounter: Payer: Self-pay | Admitting: Family Medicine

## 2017-03-22 ENCOUNTER — Ambulatory Visit (INDEPENDENT_AMBULATORY_CARE_PROVIDER_SITE_OTHER): Payer: Medicare HMO | Admitting: Family Medicine

## 2017-03-22 VITALS — BP 118/68 | HR 91 | Temp 98.2°F | Resp 16 | Ht <= 58 in | Wt 151.4 lb

## 2017-03-22 DIAGNOSIS — Z23 Encounter for immunization: Secondary | ICD-10-CM | POA: Diagnosis not present

## 2017-03-22 DIAGNOSIS — R0989 Other specified symptoms and signs involving the circulatory and respiratory systems: Secondary | ICD-10-CM

## 2017-03-22 DIAGNOSIS — Z1159 Encounter for screening for other viral diseases: Secondary | ICD-10-CM | POA: Diagnosis not present

## 2017-03-22 DIAGNOSIS — K146 Glossodynia: Secondary | ICD-10-CM | POA: Insufficient documentation

## 2017-03-22 DIAGNOSIS — G4700 Insomnia, unspecified: Secondary | ICD-10-CM

## 2017-03-22 DIAGNOSIS — H93A2 Pulsatile tinnitus, left ear: Secondary | ICD-10-CM

## 2017-03-22 DIAGNOSIS — R011 Cardiac murmur, unspecified: Secondary | ICD-10-CM

## 2017-03-22 MED ORDER — CLONAZEPAM 0.5 MG PO TABS: 0.5000 mg | ORAL_TABLET | Freq: Every day | ORAL | 1 refills | Status: DC

## 2017-03-22 NOTE — Assessment & Plan Note (Signed)
rec ENT but pt wants to wait

## 2017-03-22 NOTE — Assessment & Plan Note (Signed)
With ? Bruit  Check echo and carotid dop Hearing is normal
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
## 2017-03-23 LAB — MEASLES/MUMPS/RUBELLA IMMUNITY
MUMPS IGG: 26.5 [AU]/ml
Rubella: 27.9 index
Rubeola IgG: >300 AU/mL

## 2017-03-23 LAB — VARICELLA ZOSTER ANTIBODY, IGG: Varicella IgG: 1126 index

## 2017-03-23 LAB — HEPATITIS B SURFACE ANTIBODY,QUALITATIVE: HEP B S AB: REACTIVE — AB

## 2017-03-24 ENCOUNTER — Ambulatory Visit (HOSPITAL_BASED_OUTPATIENT_CLINIC_OR_DEPARTMENT_OTHER)
Admission: RE | Admit: 2017-03-24 | Discharge: 2017-03-24 | Disposition: A | Discharge status: Home / Self Care | Payer: Medicare HMO | Source: Ambulatory Visit | Attending: Family Medicine | Admitting: Family Medicine

## 2017-03-24 DIAGNOSIS — I6523 Occlusion and stenosis of bilateral carotid arteries: Secondary | ICD-10-CM | POA: Diagnosis not present

## 2017-03-24 DIAGNOSIS — R0989 Other specified symptoms and signs involving the circulatory and respiratory systems: Secondary | ICD-10-CM | POA: Diagnosis not present

## 2017-03-24 DIAGNOSIS — R011 Cardiac murmur, unspecified: Secondary | ICD-10-CM | POA: Insufficient documentation

## 2017-03-25 ENCOUNTER — Encounter: Payer: Self-pay | Admitting: Family Medicine

## 2017-04-01 ENCOUNTER — Encounter: Payer: Self-pay | Admitting: Family Medicine

## 2017-04-01 MED ORDER — ROPINIROLE HCL 0.5 MG PO TABS: 0.5000 mg | ORAL_TABLET | Freq: Two times a day (BID) | ORAL | 1 refills | Status: DC

## 2017-04-06 DIAGNOSIS — Z79891 Long term (current) use of opiate analgesic: Secondary | ICD-10-CM | POA: Diagnosis not present

## 2017-04-06 DIAGNOSIS — G894 Chronic pain syndrome: Secondary | ICD-10-CM | POA: Diagnosis not present

## 2017-04-06 DIAGNOSIS — M5136 Other intervertebral disc degeneration, lumbar region: Secondary | ICD-10-CM | POA: Diagnosis not present

## 2017-04-09 ENCOUNTER — Encounter: Payer: Self-pay | Admitting: *Deleted

## 2017-04-13 ENCOUNTER — Encounter: Payer: Self-pay | Admitting: Family Medicine

## 2017-04-13 ENCOUNTER — Encounter: Payer: Self-pay | Admitting: *Deleted

## 2017-04-20 ENCOUNTER — Other Ambulatory Visit (HOSPITAL_BASED_OUTPATIENT_CLINIC_OR_DEPARTMENT_OTHER): Payer: Medicare HMO

## 2017-04-22 ENCOUNTER — Ambulatory Visit (HOSPITAL_BASED_OUTPATIENT_CLINIC_OR_DEPARTMENT_OTHER): Payer: Medicare HMO

## 2017-04-26 ENCOUNTER — Encounter: Payer: Self-pay | Admitting: Family Medicine

## 2017-04-26 ENCOUNTER — Ambulatory Visit (INDEPENDENT_AMBULATORY_CARE_PROVIDER_SITE_OTHER): Payer: Medicare HMO | Admitting: Family Medicine

## 2017-04-26 ENCOUNTER — Telehealth: Payer: Self-pay

## 2017-04-26 VITALS — BP 100/70 | HR 95 | Temp 98.7°F | Resp 16 | Ht <= 58 in | Wt 154.0 lb

## 2017-04-26 DIAGNOSIS — J324 Chronic pansinusitis: Secondary | ICD-10-CM

## 2017-04-26 DIAGNOSIS — K219 Gastro-esophageal reflux disease without esophagitis: Secondary | ICD-10-CM | POA: Diagnosis not present

## 2017-04-26 DIAGNOSIS — J302 Other seasonal allergic rhinitis: Secondary | ICD-10-CM

## 2017-04-26 DIAGNOSIS — J4520 Mild intermittent asthma, uncomplicated: Secondary | ICD-10-CM

## 2017-04-26 MED ORDER — CYCLOBENZAPRINE HCL 10 MG PO TABS: ORAL_TABLET | ORAL | 1 refills | Status: DC

## 2017-04-26 MED ORDER — AMOXICILLIN-POT CLAVULANATE 875-125 MG PO TABS: 1.0000 | ORAL_TABLET | Freq: Two times a day (BID) | ORAL | 0 refills | Status: DC

## 2017-04-26 MED ORDER — ALBUTEROL SULFATE HFA 108 (90 BASE) MCG/ACT IN AERS: 2.0000 | INHALATION_SPRAY | Freq: Four times a day (QID) | RESPIRATORY_TRACT | 1 refills | Status: DC | PRN

## 2017-04-26 MED ORDER — FLUTICASONE PROPIONATE 50 MCG/ACT NA SUSP: 2.0000 | Freq: Every day | NASAL | 3 refills | Status: DC

## 2017-04-26 MED ORDER — OMEPRAZOLE 40 MG PO CPDR: 40.0000 mg | DELAYED_RELEASE_CAPSULE | Freq: Every day | ORAL | 1 refills | Status: DC

## 2017-04-26 NOTE — Assessment & Plan Note (Signed)
abx per orders  Can do pred taper if needed con't flonase-- otc antihistamine rto prn

## 2017-04-26 NOTE — Telephone Encounter (Signed)
PA initiated via Covermymeds; KEY: [id number]. Awaiting determination.
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
## 2017-04-26 NOTE — Assessment & Plan Note (Signed)
con't omeprazole Has zantac 300 mg prn Has egd wed

## 2017-04-26 NOTE — Patient Instructions (Signed)

## 2017-04-26 NOTE — Assessment & Plan Note (Signed)
con't inh prn No exacerbation now

## 2017-04-27 DIAGNOSIS — K5904 Chronic idiopathic constipation: Secondary | ICD-10-CM | POA: Diagnosis not present

## 2017-04-27 DIAGNOSIS — K449 Diaphragmatic hernia without obstruction or gangrene: Secondary | ICD-10-CM | POA: Diagnosis not present

## 2017-04-27 DIAGNOSIS — R1310 Dysphagia, unspecified: Secondary | ICD-10-CM | POA: Diagnosis not present

## 2017-04-27 DIAGNOSIS — Z1211 Encounter for screening for malignant neoplasm of colon: Secondary | ICD-10-CM | POA: Diagnosis not present

## 2017-04-27 DIAGNOSIS — E669 Obesity, unspecified: Secondary | ICD-10-CM | POA: Diagnosis not present

## 2017-04-27 DIAGNOSIS — K219 Gastro-esophageal reflux disease without esophagitis: Secondary | ICD-10-CM | POA: Diagnosis not present

## 2017-04-27 NOTE — Telephone Encounter (Signed)
PA denied.

## 2017-04-28 ENCOUNTER — Other Ambulatory Visit: Payer: Self-pay

## 2017-04-28 ENCOUNTER — Encounter: Payer: Self-pay | Admitting: Family Medicine

## 2017-04-28 DIAGNOSIS — K219 Gastro-esophageal reflux disease without esophagitis: Secondary | ICD-10-CM | POA: Diagnosis not present

## 2017-04-28 DIAGNOSIS — K319 Disease of stomach and duodenum, unspecified: Secondary | ICD-10-CM | POA: Diagnosis not present

## 2017-04-28 DIAGNOSIS — R194 Change in bowel habit: Secondary | ICD-10-CM | POA: Diagnosis not present

## 2017-04-28 DIAGNOSIS — R1310 Dysphagia, unspecified: Secondary | ICD-10-CM | POA: Diagnosis not present

## 2017-04-28 DIAGNOSIS — K317 Polyp of stomach and duodenum: Secondary | ICD-10-CM | POA: Diagnosis not present

## 2017-04-28 DIAGNOSIS — Z1211 Encounter for screening for malignant neoplasm of colon: Secondary | ICD-10-CM | POA: Diagnosis not present

## 2017-04-28 DIAGNOSIS — K259 Gastric ulcer, unspecified as acute or chronic, without hemorrhage or perforation: Secondary | ICD-10-CM | POA: Diagnosis not present

## 2017-04-28 MED ORDER — ALBUTEROL SULFATE HFA 108 (90 BASE) MCG/ACT IN AERS: 2.0000 | INHALATION_SPRAY | Freq: Four times a day (QID) | RESPIRATORY_TRACT | 5 refills | Status: DC | PRN

## 2017-04-29 ENCOUNTER — Encounter: Payer: Self-pay | Admitting: Family Medicine

## 2017-04-29 MED ORDER — VALACYCLOVIR HCL 1 G PO TABS: 1000.0000 mg | ORAL_TABLET | Freq: Two times a day (BID) | ORAL | 1 refills | Status: DC

## 2017-04-29 NOTE — Telephone Encounter (Signed)
Not all pills dissolve like they should-- it is common to see that

## 2017-05-04 ENCOUNTER — Telehealth: Payer: Self-pay | Admitting: *Deleted

## 2017-05-04 NOTE — Telephone Encounter (Signed)
Received Surgical Pathology Report results from [facility]; forwarded to provider/SLS 04/23

## 2017-06-03 ENCOUNTER — Encounter: Payer: Self-pay | Admitting: Family Medicine

## 2017-06-04 ENCOUNTER — Encounter: Payer: Self-pay | Admitting: Family Medicine

## 2017-06-04 ENCOUNTER — Other Ambulatory Visit: Payer: Self-pay | Admitting: Family Medicine

## 2017-06-04 DIAGNOSIS — R002 Palpitations: Secondary | ICD-10-CM

## 2017-06-04 NOTE — Telephone Encounter (Signed)
I put the order in and will forward to [facility] as well -- not sure what can be done but will try-- she would like echo next thurs / fri

## 2017-06-09 ENCOUNTER — Encounter: Payer: Self-pay | Admitting: Family Medicine

## 2017-06-10 DIAGNOSIS — M533 Sacrococcygeal disorders, not elsewhere classified: Secondary | ICD-10-CM | POA: Diagnosis not present

## 2017-06-11 ENCOUNTER — Other Ambulatory Visit: Payer: Self-pay | Admitting: Family Medicine

## 2017-06-11 ENCOUNTER — Encounter: Payer: Self-pay | Admitting: Family Medicine

## 2017-06-11 DIAGNOSIS — G4700 Insomnia, unspecified: Secondary | ICD-10-CM

## 2017-06-11 DIAGNOSIS — Z961 Presence of intraocular lens: Secondary | ICD-10-CM | POA: Diagnosis not present

## 2017-06-11 NOTE — Telephone Encounter (Signed)
Last clonazepam RX: 03/22/17, #30 x 1 refill Last Flexeril RX: 04/26/17, #60 x 1 refill Last OV: 04/26/17 Next OV: 06/21/17 UDS: 02/27/17, low risk CSC: 12/19/16 CSR: Please see NARX report in PCP's ledge.

## 2017-06-11 NOTE — Telephone Encounter (Signed)
Which pharmacy?

## 2017-06-14 ENCOUNTER — Encounter: Payer: Self-pay | Admitting: Family Medicine

## 2017-06-14 NOTE — Telephone Encounter (Signed)
It looks like she needs to change her appointment

## 2017-06-15 NOTE — Telephone Encounter (Signed)
Its a [facility] [address] [place], [place].  Cannot find them in epic.  I will print and wait for signature.  I will call for fax number.  [phone]

## 2017-06-17 ENCOUNTER — Other Ambulatory Visit: Payer: Self-pay | Admitting: Family Medicine

## 2017-06-17 DIAGNOSIS — K219 Gastro-esophageal reflux disease without esophagitis: Secondary | ICD-10-CM

## 2017-06-17 MED ORDER — OMEPRAZOLE 40 MG PO CPDR: DELAYED_RELEASE_CAPSULE | ORAL | 3 refills | Status: DC

## 2017-06-17 MED ORDER — CLONAZEPAM 0.5 MG PO TABS
0.5000 mg | ORAL_TABLET | Freq: Every day | ORAL | 1 refills | Status: DC
Start: 2017-06-17 — End: 2017-08-24

## 2017-06-17 MED ORDER — CYCLOBENZAPRINE HCL 10 MG PO TABS: ORAL_TABLET | ORAL | 1 refills | Status: DC

## 2017-06-17 NOTE — Telephone Encounter (Signed)
[facility]ax [facility] to [facility] [phone]

## 2017-06-17 NOTE — Telephone Encounter (Signed)
Omeprazole printed

## 2017-06-17 NOTE — Telephone Encounter (Signed)
Do you want to increase?

## 2017-06-17 NOTE — Telephone Encounter (Signed)
rx faxed

## 2017-06-21 ENCOUNTER — Encounter: Payer: Medicare HMO | Admitting: Family Medicine

## 2017-06-22 ENCOUNTER — Ambulatory Visit: Payer: Medicare HMO | Admitting: *Deleted

## 2017-08-11 ENCOUNTER — Encounter: Payer: Self-pay | Admitting: Family Medicine

## 2017-08-17 ENCOUNTER — Encounter: Payer: Self-pay | Admitting: Family Medicine

## 2017-08-17 ENCOUNTER — Ambulatory Visit (INDEPENDENT_AMBULATORY_CARE_PROVIDER_SITE_OTHER): Payer: Medicare HMO | Admitting: Family Medicine

## 2017-08-17 ENCOUNTER — Other Ambulatory Visit (HOSPITAL_COMMUNITY)
Admission: RE | Admit: 2017-08-17 | Discharge: 2017-08-17 | Disposition: A | Discharge status: Home / Self Care | Payer: Medicare HMO | Source: Ambulatory Visit | Attending: Family Medicine | Admitting: Family Medicine

## 2017-08-17 VITALS — BP 126/60 | HR 98 | Temp 98.5°F | Resp 16 | Ht <= 58 in | Wt 155.8 lb

## 2017-08-17 DIAGNOSIS — Z79899 Other long term (current) drug therapy: Secondary | ICD-10-CM | POA: Diagnosis not present

## 2017-08-17 DIAGNOSIS — Z0000 Encounter for general adult medical examination without abnormal findings: Secondary | ICD-10-CM

## 2017-08-17 DIAGNOSIS — K219 Gastro-esophageal reflux disease without esophagitis: Secondary | ICD-10-CM | POA: Insufficient documentation

## 2017-08-17 DIAGNOSIS — E1169 Type 2 diabetes mellitus with other specified complication: Secondary | ICD-10-CM

## 2017-08-17 DIAGNOSIS — E118 Type 2 diabetes mellitus with unspecified complications: Secondary | ICD-10-CM | POA: Diagnosis not present

## 2017-08-17 DIAGNOSIS — Z7951 Long term (current) use of inhaled steroids: Secondary | ICD-10-CM | POA: Diagnosis not present

## 2017-08-17 DIAGNOSIS — E785 Hyperlipidemia, unspecified: Secondary | ICD-10-CM | POA: Insufficient documentation

## 2017-08-17 DIAGNOSIS — F329 Major depressive disorder, single episode, unspecified: Secondary | ICD-10-CM | POA: Insufficient documentation

## 2017-08-17 DIAGNOSIS — J40 Bronchitis, not specified as acute or chronic: Secondary | ICD-10-CM

## 2017-08-17 DIAGNOSIS — K589 Irritable bowel syndrome without diarrhea: Secondary | ICD-10-CM | POA: Diagnosis not present

## 2017-08-17 DIAGNOSIS — J441 Chronic obstructive pulmonary disease with (acute) exacerbation: Secondary | ICD-10-CM | POA: Insufficient documentation

## 2017-08-17 DIAGNOSIS — N760 Acute vaginitis: Secondary | ICD-10-CM

## 2017-08-17 DIAGNOSIS — F419 Anxiety disorder, unspecified: Secondary | ICD-10-CM | POA: Insufficient documentation

## 2017-08-17 LAB — POC URINALSYSI DIPSTICK (AUTOMATED)
Bilirubin, UA: NEGATIVE
Blood, UA: NEGATIVE
Glucose, UA: NEGATIVE
KETONES UA: NEGATIVE
Leukocytes, UA: NEGATIVE
Nitrite, UA: NEGATIVE
PROTEIN UA: NEGATIVE
Spec Grav, UA: 1.015 (ref 1.010–1.025)
Urobilinogen, UA: 0.2 E.U./dL
pH, UA: 8 (ref 5.0–8.0)

## 2017-08-17 MED ORDER — NYSTATIN 100000 UNIT/GM EX CREA: 1.0000 "application " | TOPICAL_CREAM | Freq: Two times a day (BID) | CUTANEOUS | 0 refills | Status: DC

## 2017-08-17 NOTE — Patient Instructions (Signed)
Preventive Care 67 Years and Older, Female Preventive care refers to lifestyle choices and visits with your health care provider that can promote health and wellness. What does preventive care include?  A yearly physical exam. This is also called an annual well check.  Dental exams once or twice a year.  Routine eye exams. Ask your health care provider how often you should have your eyes checked.  Personal lifestyle choices, including: ? Daily care of your teeth and gums. ? Regular physical activity. ? Eating a healthy diet. ? Avoiding tobacco and drug use. ? Limiting alcohol use. ? Practicing safe sex. ? Taking low-dose aspirin every day. ? Taking vitamin and mineral supplements as recommended by your health care provider. What happens during an annual well check? The services and screenings done by your health care provider during your annual well check will depend on your age, overall health, lifestyle risk factors, and family history of disease. Counseling Your health care provider may ask you questions about your:  Alcohol use.  Tobacco use.  Drug use.  Emotional well-being.  Home and relationship well-being.  Sexual activity.  Eating habits.  History of falls.  Memory and ability to understand (cognition).  Work and work environment.  Reproductive health.  Screening You may have the following tests or measurements:  Height, weight, and BMI.  Blood pressure.  Lipid and cholesterol levels. These may be checked every 5 years, or more frequently if you are over 50 years old.  Skin check.  Lung cancer screening. You may have this screening every year starting at age 55 if you have a 30-pack-year history of smoking and currently smoke or have quit within the past 15 years.  Fecal occult blood test (FOBT) of the stool. You may have this test every year starting at age 50.  Flexible sigmoidoscopy or colonoscopy. You may have a sigmoidoscopy every 5 years or  a colonoscopy every 10 years starting at age 50.  Hepatitis C blood test.  Hepatitis B blood test.  Sexually transmitted disease (STD) testing.  Diabetes screening. This is done by checking your blood sugar (glucose) after you have not eaten for a while (fasting). You may have this done every 1-3 years.  Bone density scan. This is done to screen for osteoporosis. You may have this done starting at age 67.  Mammogram. This may be done every 1-2 years. Talk to your health care provider about how often you should have regular mammograms.  Talk with your health care provider about your test results, treatment options, and if necessary, the need for more tests. Vaccines Your health care provider may recommend certain vaccines, such as:  Influenza vaccine. This is recommended every year.  Tetanus, diphtheria, and acellular pertussis (Tdap, Td) vaccine. You may need a Td booster every 10 years.  Varicella vaccine. You may need this if you have not been vaccinated.  Zoster vaccine. You may need this after age 60.  Measles, mumps, and rubella (MMR) vaccine. You may need at least one dose of MMR if you were born in 1957 or later. You may also need a second dose.  Pneumococcal 13-valent conjugate (PCV13) vaccine. One dose is recommended after age 67.  Pneumococcal polysaccharide (PPSV23) vaccine. One dose is recommended after age 67.  Meningococcal vaccine. You may need this if you have certain conditions.  Hepatitis A vaccine. You may need this if you have certain conditions or if you travel or work in places where you may be exposed to hepatitis   A.  Hepatitis B vaccine. You may need this if you have certain conditions or if you travel or work in places where you may be exposed to hepatitis B.  Haemophilus influenzae type b (Hib) vaccine. You may need this if you have certain conditions.  Talk to your health care provider about which screenings and vaccines you need and how often you  need them. This information is not intended to replace advice given to you by your health care provider. Make sure you discuss any questions you have with your health care provider. Document Released: 01/25/2015 Document Revised: 09/18/2015 Document Reviewed: 10/30/2014 Elsevier Interactive Patient Education  2018 [employer]  
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
## 2017-08-17 NOTE — Telephone Encounter (Signed)
Patient in office

## 2017-08-18 LAB — COMPREHENSIVE METABOLIC PANEL
ALT: 32 U/L (ref 0–35)
AST: 26 U/L (ref 0–37)
Albumin: 4.4 g/dL (ref 3.5–5.2)
Alkaline Phosphatase: 81 U/L (ref 39–117)
BUN: 13 mg/dL (ref 6–23)
CALCIUM: 10.6 mg/dL — AB (ref 8.4–10.5)
CHLORIDE: 102 meq/L (ref 96–112)
CO2: 30 meq/L (ref 19–32)
CREATININE: 0.8 mg/dL (ref 0.40–1.20)
GFR: 75.91 mL/min (ref >60.00)
Glucose, Bld: 94 mg/dL (ref 70–99)
POTASSIUM: 4.6 meq/L (ref 3.5–5.1)
Sodium: 140 mEq/L (ref 135–145)
Total Bilirubin: 0.5 mg/dL (ref 0.2–1.2)
Total Protein: 7.1 g/dL (ref 6.0–8.3)

## 2017-08-18 LAB — CBC WITH DIFFERENTIAL/PLATELET
BASOS PCT: 1.1 % (ref 0.0–3.0)
Basophils Absolute: 0.1 10*3/uL (ref 0.0–0.1)
EOS ABS: 0.3 10*3/uL (ref 0.0–0.7)
Eosinophils Relative: 2.8 % (ref 0.0–5.0)
HEMATOCRIT: 43.4 % (ref 36.0–46.0)
Hemoglobin: 14.7 g/dL (ref 12.0–15.0)
LYMPHS PCT: 26 % (ref 12.0–46.0)
Lymphs Abs: 2.7 10*3/uL (ref 0.7–4.0)
MCHC: 33.8 g/dL (ref 30.0–36.0)
MCV: 85.9 fl (ref 78.0–100.0)
MONO ABS: 0.9 10*3/uL (ref 0.1–1.0)
Monocytes Relative: 8.1 % (ref 3.0–12.0)
Neutro Abs: 6.5 10*3/uL (ref 1.4–7.7)
Neutrophils Relative %: 62 % (ref 43.0–77.0)
PLATELETS: 382 10*3/uL (ref 150.0–400.0)
RBC: 5.05 Mil/uL (ref 3.87–5.11)
RDW: 13.7 % (ref 11.5–15.5)
WBC: 10.5 10*3/uL (ref 4.0–10.5)

## 2017-08-18 LAB — LIPID PANEL
CHOLESTEROL: 214 mg/dL — AB (ref 0–200)
HDL: 59.5 mg/dL (ref >39.00)
LDL CALC: 118 mg/dL — AB (ref 0–99)
NonHDL: 154.24
Total CHOL/HDL Ratio: 4
Triglycerides: 180 mg/dL — ABNORMAL HIGH (ref 0.0–149.0)
VLDL: 36 mg/dL (ref 0.0–40.0)

## 2017-08-18 NOTE — Telephone Encounter (Signed)
Pt seen yesterday.

## 2017-08-19 ENCOUNTER — Telehealth: Payer: Self-pay | Admitting: *Deleted

## 2017-08-19 DIAGNOSIS — Z0000 Encounter for general adult medical examination without abnormal findings: Secondary | ICD-10-CM | POA: Insufficient documentation

## 2017-08-19 LAB — CERVICOVAGINAL ANCILLARY ONLY
Bacterial vaginitis: POSITIVE — AB
CANDIDA VAGINITIS: NEGATIVE
Chlamydia: NEGATIVE
Neisseria Gonorrhea: NEGATIVE
Trichomonas: NEGATIVE

## 2017-08-19 NOTE — Telephone Encounter (Signed)
Copied from [place] [id number]. Topic: Inquiry >> Aug 19, 2017 12:48 PM Pricilla Handler wrote: Reason for CRM: Rogue Jury with [facility] [employer] [phone] # [phone], Fax # [phone]) called requesting if someone in the office would complete a form that the patient forgot to bring with her during her "mini-physical" with Dr. [name] on Tuesday 08/17/2017. Patient has to have this form completed for work. Rogue Jury has faxed the document to our office. Please call Rogue Jury at [phone].       Thank You!!!

## 2017-08-19 NOTE — Addendum Note (Signed)
Addended by: Roma Schanz R on: 08/19/2017 07:52 PM   Modules accepted: Level of Service

## 2017-08-19 NOTE — Assessment & Plan Note (Signed)
See AVS Check labs  ghm utd 

## 2017-08-19 NOTE — Telephone Encounter (Signed)
Have you seen a form? 

## 2017-08-20 ENCOUNTER — Encounter: Payer: Self-pay | Admitting: *Deleted

## 2017-08-20 ENCOUNTER — Encounter: Payer: Self-pay | Admitting: Family Medicine

## 2017-08-20 NOTE — Telephone Encounter (Signed)
Yes, I have the form/SLS 08/09

## 2017-08-23 ENCOUNTER — Encounter: Payer: Self-pay | Admitting: Family Medicine

## 2017-08-23 ENCOUNTER — Other Ambulatory Visit: Payer: Self-pay | Admitting: Family Medicine

## 2017-08-23 DIAGNOSIS — G4700 Insomnia, unspecified: Secondary | ICD-10-CM

## 2017-08-23 MED ORDER — METRONIDAZOLE 500 MG PO TABS: 500.0000 mg | ORAL_TABLET | Freq: Two times a day (BID) | ORAL | 0 refills | Status: DC

## 2017-08-23 NOTE — Telephone Encounter (Signed)
Patient requesting 90 day supply clonazepam  Database ran 06/11/17 and is in chart for review  Last written: 06/17/17 Last ov: 08/19/17 Next ov:08/31/17 Contract: will get at 08/31/17 visit UDS: 03/09/18

## 2017-08-23 NOTE — Telephone Encounter (Signed)
See other mychart message.

## 2017-08-23 NOTE — Telephone Encounter (Signed)
See my chart message

## 2017-08-24 ENCOUNTER — Encounter: Payer: Self-pay | Admitting: *Deleted

## 2017-08-24 MED ORDER — CLONAZEPAM 0.5 MG PO TABS: 0.5000 mg | ORAL_TABLET | Freq: Every day | ORAL | 0 refills | Status: DC

## 2017-08-24 NOTE — Telephone Encounter (Signed)
Form faxed today and confirmation received.

## 2017-08-24 NOTE — Telephone Encounter (Signed)
zetia is not a statin--- I she has tried it before I would rec lipid clinic Flagyl should have already been called iin

## 2017-08-24 NOTE — Telephone Encounter (Signed)
Reviewed NCCSR: 05/03/2017  2  05/03/2017  Lyrica 50 Mg Capsule  90.00 30 Ri Ram  [id number]  Wal ([id number])  1/1 1.01 LME Medicare  [place]  05/01/2017  2  03/22/2017  Clonazepam 0.5 Mg Tablet  30.00 30 Yv Low  [id number]  Wal ([id number])  2/1 1.00 LME Medicare  [place]  04/22/2017  2  04/22/2017  Tramadol Hcl 50 Mg Tablet  120.00 30 Ca Kno  [id number]  Wal ([id number])  1/1 20.00 MME Medicare  [place]  03/22/2017  2  03/22/2017  Clonazepam 0.5 Mg Tablet  30.00 30 Yv Low  [id number]  Wal ([id number])  1/1 1.00 LME Medicare  [place]  03/10/2017  4  03/10/2017  Tramadol Hcl 50 Mg Tablet  90.00 30 Ri Ram  [id number]  Wal ([id number])  1/1 15.00 MME Medicare  [place]  02/07/2017  1  02/05/2017  Tramadol Hcl 50 Mg Tablet  90.00 30 Ri Ram  [id number]  Wal ([id number])  1/1 15.00 MME Medicare  [place]  12/11/2016  3  09/01/2016  Lyrica 50 Mg Capsule  90.00 30 Ri Ram  [id number]  [facility] ([id number])  3/3 1.01 LME Private Pay  [place]  11/27/2016  2  10/06/2016  Zolpidem Tartrate 5 Mg Tablet  30.00 30 Yv Low  [id number]  Wal ([id number])  2/1      Per the chart [name] wrote for Klonopin 0.5, 1 at bedtime #90 with 1 RF on 06/17/17, but this rx was printed and pt states she does not have it.  It was not filled in any case.   I will give her a 90 day rx as requested but no RF

## 2017-08-24 NOTE — Telephone Encounter (Signed)
Did she send info because I did not see anything

## 2017-08-27 NOTE — Telephone Encounter (Signed)
[name]/ [name]---  How can we find out about emotional support dogs and whats needed --- she wants a letter stating her dog is an emotional support dog.  whats our liability --- and her psych would not do it.

## 2017-08-27 NOTE — Telephone Encounter (Signed)
Did you read the message that was attached about the emotional support dog

## 2017-08-30 NOTE — Telephone Encounter (Signed)
Question regarding having PCP sign off on 'emotional support dog' ppwk forwarded to Engineer, building services, [place].

## 2017-08-31 ENCOUNTER — Encounter: Payer: Medicare HMO | Admitting: Family Medicine

## 2017-09-06 ENCOUNTER — Encounter: Payer: Medicare HMO | Admitting: Family Medicine

## 2017-09-23 ENCOUNTER — Encounter: Payer: Self-pay | Admitting: Family Medicine

## 2017-09-27 ENCOUNTER — Encounter: Payer: Self-pay | Admitting: Family Medicine

## 2017-09-28 ENCOUNTER — Other Ambulatory Visit: Payer: Self-pay | Admitting: *Deleted

## 2017-09-28 DIAGNOSIS — N898 Other specified noninflammatory disorders of vagina: Secondary | ICD-10-CM

## 2017-09-28 MED ORDER — ESTROGENS, CONJUGATED 0.625 MG/GM VA CREA: TOPICAL_CREAM | VAGINAL | 0 refills | Status: DC

## 2017-09-28 MED ORDER — ROPINIROLE HCL 0.5 MG PO TABS: 0.5000 mg | ORAL_TABLET | Freq: Two times a day (BID) | ORAL | 0 refills | Status: DC

## 2017-09-28 MED ORDER — CYCLOBENZAPRINE HCL 10 MG PO TABS: ORAL_TABLET | ORAL | 0 refills | Status: DC

## 2017-09-28 MED ORDER — ALENDRONATE SODIUM 70 MG PO TABS: ORAL_TABLET | ORAL | 0 refills | Status: DC

## 2017-09-28 NOTE — Telephone Encounter (Signed)
Other rxs sent.  Can you send the ambien in with the clonazepam.

## 2017-09-28 NOTE — Telephone Encounter (Signed)
She really needs to see a uro gyn for the pessery ------ we could try vesicare 10 mg 1 po qd  #30  ---- when will her ins kick in?  Can she go to an urgent care there and get a referral to a specialist while she is there?   Ok to refill meds x 1 month

## 2017-09-29 ENCOUNTER — Other Ambulatory Visit: Payer: Self-pay | Admitting: Family Medicine

## 2017-09-29 DIAGNOSIS — N898 Other specified noninflammatory disorders of vagina: Secondary | ICD-10-CM

## 2017-09-29 DIAGNOSIS — G4700 Insomnia, unspecified: Secondary | ICD-10-CM

## 2017-09-29 MED ORDER — ROPINIROLE HCL 0.5 MG PO TABS: 0.5000 mg | ORAL_TABLET | Freq: Two times a day (BID) | ORAL | 0 refills | Status: DC

## 2017-09-29 MED ORDER — ALENDRONATE SODIUM 70 MG PO TABS: ORAL_TABLET | ORAL | 0 refills | Status: DC

## 2017-09-29 MED ORDER — ESTROGENS, CONJUGATED 0.625 MG/GM VA CREA: TOPICAL_CREAM | VAGINAL | 0 refills | Status: DC

## 2017-09-29 MED ORDER — CLONAZEPAM 0.5 MG PO TABS: 0.5000 mg | ORAL_TABLET | Freq: Every day | ORAL | 0 refills | Status: DC

## 2017-09-30 NOTE — Telephone Encounter (Signed)
I have already sent them in

## 2017-09-30 NOTE — Telephone Encounter (Signed)
meds were refilled

## 2017-10-07 NOTE — Telephone Encounter (Signed)
I okay'd refill a while ago She really need s referral --- as I said earlier  An urgent care should be able to get her somewhere there

## 2017-10-08 ENCOUNTER — Encounter: Payer: Self-pay | Admitting: Family Medicine

## 2017-10-08 ENCOUNTER — Other Ambulatory Visit: Payer: Self-pay | Admitting: Family Medicine

## 2017-10-08 DIAGNOSIS — G4700 Insomnia, unspecified: Secondary | ICD-10-CM

## 2017-10-08 DIAGNOSIS — F411 Generalized anxiety disorder: Secondary | ICD-10-CM

## 2017-10-08 MED ORDER — ZOLPIDEM TARTRATE 5 MG PO TABS: 5.0000 mg | ORAL_TABLET | Freq: Every evening | ORAL | 1 refills | Status: DC | PRN

## 2017-10-08 NOTE — Telephone Encounter (Signed)
Can you send in the [place].  I did not see where that was sent.

## 2017-10-08 NOTE — Telephone Encounter (Signed)
Done but I don't know if they will take controlled sub in another state

## 2017-10-11 MED ORDER — ESCITALOPRAM OXALATE 20 MG PO TABS: 20.0000 mg | ORAL_TABLET | Freq: Every day | ORAL | 1 refills | Status: DC

## 2017-10-12 NOTE — Telephone Encounter (Signed)
noted 

## 2017-10-15 ENCOUNTER — Encounter: Payer: Self-pay | Admitting: *Deleted

## 2017-10-27 ENCOUNTER — Other Ambulatory Visit: Payer: Self-pay | Admitting: Family Medicine

## 2017-10-28 ENCOUNTER — Encounter: Payer: Self-pay | Admitting: Family Medicine

## 2017-10-29 NOTE — Telephone Encounter (Signed)
We can not write 10 mg ambien for women over 55-----5 is all that is allowed and she still should not take it every day
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
## 2017-11-02 NOTE — Telephone Encounter (Signed)
Ok to give #20

## 2017-11-03 MED ORDER — METRONIDAZOLE 500 MG PO TABS: 500.0000 mg | ORAL_TABLET | Freq: Two times a day (BID) | ORAL | 0 refills | Status: DC

## 2017-11-03 NOTE — Telephone Encounter (Signed)
Ok to send flagyl 500 bid x 7 days but she will need to go to UC there if it does not resolve

## 2017-11-22 ENCOUNTER — Telehealth: Payer: Self-pay | Admitting: *Deleted

## 2017-11-22 NOTE — Telephone Encounter (Signed)
Patient sent forms to have her dog recognized as a medical assistance/support dog, completed as much as possible; forwarded to provider/SLS 11/11

## 2017-11-29 ENCOUNTER — Other Ambulatory Visit: Payer: Self-pay | Admitting: Family Medicine

## 2017-11-29 ENCOUNTER — Encounter: Payer: Self-pay | Admitting: Family Medicine

## 2017-11-29 DIAGNOSIS — K219 Gastro-esophageal reflux disease without esophagitis: Secondary | ICD-10-CM

## 2017-11-29 DIAGNOSIS — G4700 Insomnia, unspecified: Secondary | ICD-10-CM

## 2017-11-30 MED ORDER — OMEPRAZOLE 40 MG PO CPDR: DELAYED_RELEASE_CAPSULE | ORAL | 3 refills | Status: DC

## 2017-11-30 NOTE — Telephone Encounter (Signed)
Last filled at pharmacy on 11/09/17 for 15 tabs Last written: 10/08/17 Last ov: 08/17/17  Next ov: none Contract: will get at last appt.  Not sure why I do not see the one we gave her at visit. UDS: 03/09/18

## 2017-12-02 ENCOUNTER — Telehealth: Payer: Self-pay | Admitting: *Deleted

## 2017-12-02 NOTE — Telephone Encounter (Signed)
[place] denied [place] for omeprazole 40 twice a day.  Ins will only cover once a day.  Do you want to make a change?

## 2017-12-02 NOTE — Telephone Encounter (Signed)
Let pt know her ins will not cover  She can but otc for second dose

## 2017-12-03 NOTE — Telephone Encounter (Signed)
Patient notified and verbalized understanding. 

## 2017-12-14 ENCOUNTER — Encounter: Payer: Self-pay | Admitting: Family Medicine

## 2017-12-18 ENCOUNTER — Encounter: Payer: Self-pay | Admitting: Family Medicine

## 2017-12-19 ENCOUNTER — Encounter: Payer: Self-pay | Admitting: Family Medicine

## 2017-12-20 NOTE — Telephone Encounter (Signed)
Was this just FYI---  We cant refill controlled sub again.

## 2017-12-22 ENCOUNTER — Encounter: Payer: Self-pay | Admitting: Family Medicine

## 2017-12-28 ENCOUNTER — Telehealth: Payer: Self-pay

## 2017-12-28 NOTE — Telephone Encounter (Signed)
Letters emailed to patient

## 2017-12-28 NOTE — Telephone Encounter (Signed)
Patient called in to office states she needs 2 letters dated 02/23/17 e-mailed to her. Discussed with providers nurse who will see that this gets done today. Patietn going out of town and needs letters.

## 2017-12-28 NOTE — Telephone Encounter (Signed)
Patient states she also needs letter for apartment complex [place] regarding her service dog. Says they never received it.

## 2017-12-30 NOTE — Telephone Encounter (Signed)
Spoke with pt. She states she submitted a form back in October or November from her apartment complex regarding her service animal. She states our office called her in November and told her the from had been faxed back. Advised pt we would look into this further and if we are unable to find completed form in her chart would she be able to bring another blank copy to [place] to complete. Pt states she will look and if able to find a blank copy she will bring with her to her appt on Friday.   Sharon:  Please see phone note from 11/22/17 and pt message from 11/29/17 documenting about form and completion. I do not see a copy scanned into her record. Would you have a copy in your file?

## 2018-01-03 ENCOUNTER — Encounter: Payer: Self-pay | Admitting: Family Medicine

## 2018-01-03 ENCOUNTER — Ambulatory Visit: Payer: Medicare HMO | Admitting: Family Medicine

## 2018-01-03 ENCOUNTER — Encounter: Payer: Self-pay | Admitting: *Deleted

## 2018-01-03 ENCOUNTER — Ambulatory Visit (INDEPENDENT_AMBULATORY_CARE_PROVIDER_SITE_OTHER): Payer: Managed Care, Other (non HMO) | Admitting: Family Medicine

## 2018-01-03 VITALS — BP 112/78 | HR 70 | Temp 98.1°F | Resp 16 | Ht <= 58 in | Wt 151.6 lb

## 2018-01-03 DIAGNOSIS — R350 Frequency of micturition: Secondary | ICD-10-CM | POA: Diagnosis not present

## 2018-01-03 DIAGNOSIS — H6121 Impacted cerumen, right ear: Secondary | ICD-10-CM | POA: Diagnosis not present

## 2018-01-03 DIAGNOSIS — Z79899 Other long term (current) drug therapy: Secondary | ICD-10-CM | POA: Diagnosis not present

## 2018-01-03 DIAGNOSIS — L219 Seborrheic dermatitis, unspecified: Secondary | ICD-10-CM | POA: Diagnosis not present

## 2018-01-03 DIAGNOSIS — J302 Other seasonal allergic rhinitis: Secondary | ICD-10-CM

## 2018-01-03 DIAGNOSIS — E785 Hyperlipidemia, unspecified: Secondary | ICD-10-CM | POA: Diagnosis not present

## 2018-01-03 DIAGNOSIS — G894 Chronic pain syndrome: Secondary | ICD-10-CM | POA: Diagnosis not present

## 2018-01-03 DIAGNOSIS — Z5181 Encounter for therapeutic drug level monitoring: Secondary | ICD-10-CM | POA: Diagnosis not present

## 2018-01-03 LAB — POC URINALSYSI DIPSTICK (AUTOMATED)
BILIRUBIN UA: NEGATIVE
Blood, UA: NEGATIVE
Glucose, UA: NEGATIVE
KETONES UA: NEGATIVE
Nitrite, UA: NEGATIVE
Protein, UA: NEGATIVE
Spec Grav, UA: 1.02 (ref 1.010–1.025)
Urobilinogen, UA: 0.2 E.U./dL
pH, UA: 6 (ref 5.0–8.0)

## 2018-01-03 MED ORDER — FLUOCINOLONE ACETONIDE 0.01 % EX SHAM: MEDICATED_SHAMPOO | CUTANEOUS | 0 refills | Status: DC

## 2018-01-03 MED ORDER — FLUTICASONE PROPIONATE 50 MCG/ACT NA SUSP: 2.0000 | Freq: Every day | NASAL | 3 refills | Status: DC
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
## 2018-01-03 NOTE — Assessment & Plan Note (Signed)
capex shampoo Consider derm referral

## 2018-01-03 NOTE — Telephone Encounter (Signed)
Pt has app today

## 2018-01-03 NOTE — Assessment & Plan Note (Signed)
flonase refilled.

## 2018-01-03 NOTE — Assessment & Plan Note (Signed)
Use debrox  Use tissue wick to soak up wet wax rto for irrigation prn

## 2018-01-03 NOTE — Assessment & Plan Note (Signed)
Urine culture pending  May need referral

## 2018-01-03 NOTE — Assessment & Plan Note (Signed)
Encouraged heart healthy diet, increase exercise, avoid trans fats, consider a krill oil cap daily 

## 2018-01-03 NOTE — Patient Instructions (Signed)

## 2018-01-04 LAB — COMPREHENSIVE METABOLIC PANEL
ALBUMIN: 4.6 g/dL (ref 3.5–5.2)
ALT: 19 U/L (ref 0–35)
AST: 19 U/L (ref 0–37)
Alkaline Phosphatase: 53 U/L (ref 39–117)
BILIRUBIN TOTAL: 0.5 mg/dL (ref 0.2–1.2)
BUN: 19 mg/dL (ref 6–23)
CALCIUM: 9.9 mg/dL (ref 8.4–10.5)
CO2: 26 meq/L (ref 19–32)
Chloride: 105 mEq/L (ref 96–112)
Creatinine, Ser: 0.91 mg/dL (ref 0.40–1.20)
GFR: 65.35 mL/min (ref >60.00)
Glucose, Bld: 93 mg/dL (ref 70–99)
Potassium: 4.1 mEq/L (ref 3.5–5.1)
Sodium: 140 mEq/L (ref 135–145)
Total Protein: 7 g/dL (ref 6.0–8.3)

## 2018-01-04 LAB — LIPID PANEL
CHOL/HDL RATIO: 3
Cholesterol: 153 mg/dL (ref 0–200)
HDL: 53.3 mg/dL (ref >39.00)
LDL Cholesterol: 75 mg/dL (ref 0–99)
NonHDL: 99.78
TRIGLYCERIDES: 122 mg/dL (ref 0.0–149.0)
VLDL: 24.4 mg/dL (ref 0.0–40.0)

## 2018-01-04 LAB — URINE CULTURE
MICRO NUMBER:: 91533325
SPECIMEN QUALITY:: ADEQUATE

## 2018-01-11 ENCOUNTER — Encounter: Payer: Self-pay | Admitting: *Deleted

## 2018-01-11 ENCOUNTER — Encounter: Payer: Self-pay | Admitting: Family Medicine

## 2018-01-11 NOTE — Telephone Encounter (Signed)
Database ran 11/30/17 and is media for review.  Also we cannot see some of the controls base on some cities she live.  Database is limited   Last written: 1120/2019 Last ov: 01/03/18 Next ov: none Contract: will get at next visit UDS: 03/09/18

## 2018-01-13 ENCOUNTER — Encounter: Payer: Self-pay | Admitting: Family Medicine

## 2018-01-13 ENCOUNTER — Other Ambulatory Visit: Payer: Self-pay | Admitting: Family Medicine

## 2018-01-13 DIAGNOSIS — G4700 Insomnia, unspecified: Secondary | ICD-10-CM

## 2018-01-13 NOTE — Telephone Encounter (Signed)
What pharmacy?  I still do not want her taking them every day

## 2018-01-14 ENCOUNTER — Other Ambulatory Visit: Payer: Self-pay | Admitting: Family Medicine

## 2018-01-14 DIAGNOSIS — G4700 Insomnia, unspecified: Secondary | ICD-10-CM

## 2018-01-14 MED ORDER — ZOLPIDEM TARTRATE 5 MG PO TABS: 5.0000 mg | ORAL_TABLET | Freq: Every evening | ORAL | 1 refills | Status: DC | PRN

## 2018-01-14 NOTE — Telephone Encounter (Signed)
Pharmacy put in. Can you please send in [place].  [facility] [address]

## 2018-01-14 NOTE — Telephone Encounter (Signed)
Duplicate message. 

## 2018-01-30 ENCOUNTER — Encounter: Payer: Self-pay | Admitting: Family Medicine

## 2018-02-03 ENCOUNTER — Other Ambulatory Visit: Payer: Self-pay | Admitting: Family Medicine

## 2018-02-03 DIAGNOSIS — I10 Essential (primary) hypertension: Secondary | ICD-10-CM

## 2018-02-03 MED ORDER — HYDROCHLOROTHIAZIDE 25 MG PO TABS: 25.0000 mg | ORAL_TABLET | Freq: Every day | ORAL | 1 refills | Status: DC

## 2018-02-03 MED ORDER — HYDROCODONE-ACETAMINOPHEN 5-325 MG PO TABS: 1.0000 | ORAL_TABLET | Freq: Four times a day (QID) | ORAL | 0 refills | Status: DC | PRN

## 2018-02-03 MED ORDER — CYCLOBENZAPRINE HCL 10 MG PO TABS: 10.0000 mg | ORAL_TABLET | Freq: Two times a day (BID) | ORAL | 1 refills | Status: DC | PRN

## 2018-02-03 NOTE — Telephone Encounter (Signed)
I refilled both Not sure what she is saying about skin/ bladder though

## 2018-02-11 NOTE — Telephone Encounter (Signed)
I'll keep an eye out for it in scan/Chart; it was completed and faxed/SLS

## 2018-02-12 ENCOUNTER — Encounter: Payer: Self-pay | Admitting: Family Medicine

## 2018-02-15 ENCOUNTER — Encounter: Payer: Self-pay | Admitting: Family Medicine

## 2018-02-15 NOTE — Telephone Encounter (Signed)
See other messages

## 2018-02-18 ENCOUNTER — Encounter: Payer: Self-pay | Admitting: Family Medicine

## 2018-02-22 NOTE — Telephone Encounter (Signed)
She never really asked a question----- please call her and see what her question is
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
## 2018-03-08 ENCOUNTER — Encounter: Payer: Self-pay | Admitting: Family Medicine

## 2018-03-09 ENCOUNTER — Encounter: Payer: Self-pay | Admitting: Family Medicine

## 2018-03-10 NOTE — Telephone Encounter (Signed)
Appt scheduled 03/11/2018.

## 2018-03-11 ENCOUNTER — Encounter: Payer: Self-pay | Admitting: Family Medicine

## 2018-03-11 ENCOUNTER — Ambulatory Visit (INDEPENDENT_AMBULATORY_CARE_PROVIDER_SITE_OTHER): Payer: Medicare HMO | Admitting: Family Medicine

## 2018-03-11 VITALS — BP 116/72 | HR 74 | Resp 14 | Ht <= 58 in | Wt 152.0 lb

## 2018-03-11 DIAGNOSIS — H6121 Impacted cerumen, right ear: Secondary | ICD-10-CM | POA: Diagnosis not present
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
## 2018-03-11 NOTE — Patient Instructions (Signed)
Earwax Buildup, Adult  The ears produce a substance called earwax that helps keep bacteria out of the ear and protects the skin in the ear canal. Occasionally, earwax can build up in the ear and cause discomfort or hearing loss.  What increases the risk?  This condition is more likely to develop in people who:  · Are female.  · Are elderly.  · Naturally produce more earwax.  · Clean their ears often with cotton swabs.  · Use earplugs often.  · Use in-ear headphones often.  · Wear hearing aids.  · Have narrow ear canals.  · Have earwax that is overly thick or sticky.  · Have eczema.  · Are dehydrated.  · Have excess hair in the ear canal.  What are the signs or symptoms?  Symptoms of this condition include:  · Reduced or muffled hearing.  · A feeling of fullness in the ear or feeling that the ear is plugged.  · Fluid coming from the ear.  · Ear pain.  · Ear itch.  · Ringing in the ear.  · Coughing.  · An obvious piece of earwax that can be seen inside the ear canal.  How is this diagnosed?  This condition may be diagnosed based on:  · Your symptoms.  · Your medical history.  · An ear exam. During the exam, your health care provider will look into your ear with an instrument called an otoscope.  You may have tests, including a hearing test.  How is this treated?  This condition may be treated by:  · Using ear drops to soften the earwax.  · Having the earwax removed by a health care provider. The health care provider may:  ? Flush the ear with water.  ? Use an instrument that has a loop on the end (curette).  ? Use a suction device.  · Surgery to remove the wax buildup. This may be done in severe cases.  Follow these instructions at home:    · Take over-the-counter and prescription medicines only as told by your health care provider.  · Do not put any objects, including cotton swabs, into your ear. You can clean the opening of your ear canal with a washcloth or facial tissue.  · Follow instructions from your health care  provider about cleaning your ears. Do not over-clean your ears.  · Drink enough fluid to keep your urine clear or pale yellow. This will help to thin the earwax.  · Keep all follow-up visits as told by your health care provider. If earwax builds up in your ears often or if you use hearing aids, consider seeing your health care provider for routine, preventive ear cleanings. Ask your health care provider how often you should schedule your cleanings.  · If you have hearing aids, clean them according to instructions from the manufacturer and your health care provider.  Contact a health care provider if:  · You have ear pain.  · You develop a fever.  · You have blood, pus, or other fluid coming from your ear.  · You have hearing loss.  · You have ringing in your ears that does not go away.  · Your symptoms do not improve with treatment.  · You feel like the room is spinning (vertigo).  Summary  · Earwax can build up in the ear and cause discomfort or hearing loss.  · The most common symptoms of this condition include reduced or muffled hearing and a feeling of   fullness in the ear or feeling that the ear is plugged.  · This condition may be diagnosed based on your symptoms, your medical history, and an ear exam.  · This condition may be treated by using ear drops to soften the earwax or by having the earwax removed by a health care provider.  · Do not put any objects, including cotton swabs, into your ear. You can clean the opening of your ear canal with a washcloth or facial tissue.  This information is not intended to replace advice given to you by your health care provider. Make sure you discuss any questions you have with your health care provider.  Document Released: 02/06/2004 Document Revised: 12/10/2016 Document Reviewed: 03/11/2016  Elsevier Interactive Patient Education © 2019 [employer]

## 2018-03-13 DIAGNOSIS — H6121 Impacted cerumen, right ear: Secondary | ICD-10-CM | POA: Insufficient documentation

## 2018-03-13 NOTE — Assessment & Plan Note (Signed)
Hearing improved after irrigation  rto prn
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
## 2018-03-15 ENCOUNTER — Ambulatory Visit (INDEPENDENT_AMBULATORY_CARE_PROVIDER_SITE_OTHER): Payer: Medicare HMO | Admitting: Family Medicine

## 2018-03-15 ENCOUNTER — Ambulatory Visit (INDEPENDENT_AMBULATORY_CARE_PROVIDER_SITE_OTHER): Payer: Medicare HMO | Admitting: *Deleted

## 2018-03-15 ENCOUNTER — Encounter: Payer: Self-pay | Admitting: Family Medicine

## 2018-03-15 ENCOUNTER — Encounter: Payer: Self-pay | Admitting: *Deleted

## 2018-03-15 VITALS — BP 120/78 | HR 64 | Resp 12 | Ht <= 58 in | Wt 152.0 lb

## 2018-03-15 VITALS — BP 136/70 | HR 83 | Ht <= 58 in | Wt 154.0 lb

## 2018-03-15 DIAGNOSIS — S134XXA Sprain of ligaments of cervical spine, initial encounter: Secondary | ICD-10-CM

## 2018-03-15 DIAGNOSIS — Z780 Asymptomatic menopausal state: Secondary | ICD-10-CM

## 2018-03-15 DIAGNOSIS — Z0000 Encounter for general adult medical examination without abnormal findings: Secondary | ICD-10-CM | POA: Diagnosis not present

## 2018-03-15 DIAGNOSIS — F321 Major depressive disorder, single episode, moderate: Secondary | ICD-10-CM

## 2018-03-15 MED ORDER — CYCLOBENZAPRINE HCL 10 MG PO TABS: 10.0000 mg | ORAL_TABLET | Freq: Two times a day (BID) | ORAL | 1 refills | Status: DC | PRN

## 2018-03-15 NOTE — Patient Instructions (Signed)
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
## 2018-03-15 NOTE — Progress Notes (Signed)
Reviewed  Yvonne R Lowne Chase, DO  

## 2018-03-15 NOTE — Patient Instructions (Signed)
Great to see you again.  Continue to eat heart healthy diet (full of fruits, vegetables, whole grains, lean protein, water--limit salt, fat, and sugar intake) and increase physical activity as tolerated.  I have ordered your bone density. Please stop downstairs to schedule.   [name] , Thank you for taking time to come for your Medicare Wellness Visit. I appreciate your ongoing commitment to your health goals. Please review the following plan we discussed and let me know if I can assist you in the future.   These are the goals we discussed: Goals    . Increase physical activity       This is a list of the screening recommended for you and due dates:  Health Maintenance  Topic Date Due  . Urine Protein Check  01/14/1960  . Mammogram  12/09/2017  . Pneumonia vaccines (2 of 2 - PPSV23) 03/16/2018  . Tetanus Vaccine  09/21/2020  . Colon Cancer Screening  04/29/2027  . Flu Shot  Completed  . DEXA scan (bone density measurement)  Completed  .  Hepatitis C: One time screening is recommended by Center for Disease Control  (CDC) for  adults born from [birth year] through 1965.   Completed    Health Maintenance After Age 16 After age 58, you are at a higher risk for certain long-term diseases and infections as well as injuries from falls. Falls are a major cause of broken bones and head injuries in people who are older than age 32. Getting regular preventive care can help to keep you healthy and well. Preventive care includes getting regular testing and making lifestyle changes as recommended by your health care provider. Talk with your health care provider about:  Which screenings and tests you should have. A screening is a test that checks for a disease when you have no symptoms.  A diet and exercise plan that is right for you. What should I know about screenings and tests to prevent falls? Screening and testing are the best ways to find a health problem early. Early diagnosis and treatment  give you the best chance of managing medical conditions that are common after age 41. Certain conditions and lifestyle choices may make you more likely to have a fall. Your health care provider may recommend:  Regular vision checks. Poor vision and conditions such as cataracts can make you more likely to have a fall. If you wear glasses, make sure to get your prescription updated if your vision changes.  Medicine review. Work with your health care provider to regularly review all of the medicines you are taking, including over-the-counter medicines. Ask your health care provider about any side effects that may make you more likely to have a fall. Tell your health care provider if any medicines that you take make you feel dizzy or sleepy.  Osteoporosis screening. Osteoporosis is a condition that causes the bones to get weaker. This can make the bones weak and cause them to break more easily.  Blood pressure screening. Blood pressure changes and medicines to control blood pressure can make you feel dizzy.  Strength and balance checks. Your health care provider may recommend certain tests to check your strength and balance while standing, walking, or changing positions.  Foot health exam. Foot pain and numbness, as well as not wearing proper footwear, can make you more likely to have a fall.  Depression screening. You may be more likely to have a fall if you have a fear of falling, feel emotionally low,  or feel unable to do activities that you used to do.  Alcohol use screening. Using too much alcohol can affect your balance and may make you more likely to have a fall. What actions can I take to lower my risk of falls? General instructions  Talk with your health care provider about your risks for falling. Tell your health care provider if: ? You fall. Be sure to tell your health care provider about all falls, even ones that seem minor. ? You feel dizzy, sleepy, or off-balance.  Take  over-the-counter and prescription medicines only as told by your health care provider. These include any supplements.  Eat a healthy diet and maintain a healthy weight. A healthy diet includes low-fat dairy products, low-fat (lean) meats, and fiber from whole grains, beans, and lots of fruits and vegetables. Home safety  Remove any tripping hazards, such as rugs, cords, and clutter.  Install safety equipment such as grab bars in bathrooms and safety rails on stairs.  Keep rooms and walkways well-lit. Activity   Follow a regular exercise program to stay fit. This will help you maintain your balance. Ask your health care provider what types of exercise are appropriate for you.  If you need a cane or walker, use it as recommended by your health care provider.  Wear supportive shoes that have nonskid soles. Lifestyle  Do not drink alcohol if your health care provider tells you not to drink.  If you drink alcohol, limit how much you have: ? 0-1 drink a day for women. ? 0-2 drinks a day for men.  Be aware of how much alcohol is in your drink. In the [place] one drink equals one typical bottle of beer (12 oz), one-half glass of wine (5 oz), or one shot of hard liquor (1 oz).  Do not use any products that contain nicotine or tobacco, such as cigarettes and e-cigarettes. If you need help quitting, ask your health care provider. Summary  Having a healthy lifestyle and getting preventive care can help to protect your health and wellness after age 89.  Screening and testing are the best way to find a health problem early and help you avoid having a fall. Early diagnosis and treatment give you the best chance for managing medical conditions that are more common for people who are older than age 6.  Falls are a major cause of broken bones and head injuries in people who are older than age 83. Take precautions to prevent a fall at home.  Work with your health care provider to learn what changes  you can make to improve your health and wellness and to prevent falls. This information is not intended to replace advice given to you by your health care provider. Make sure you discuss any questions you have with your health care provider. Document Released: 11/11/2016 Document Revised: 11/11/2016 Document Reviewed: 11/11/2016 Elsevier Interactive Patient Education  2019 [employer].

## 2018-03-16 NOTE — Assessment & Plan Note (Signed)
con't meds Consider counseling  rto prn

## 2018-03-24 ENCOUNTER — Telehealth: Payer: Self-pay | Admitting: *Deleted

## 2018-03-24 NOTE — Telephone Encounter (Signed)
[facility] called about date of accident for patient for referral.  We had dated of 02/20/18 in our note.  We referred her chiropractor.  If she has an issue of dated that the office we referred her to then she will need to call them. If it is our note then she can request for [name] to addend note.

## 2018-03-29 ENCOUNTER — Encounter: Payer: Self-pay | Admitting: Family Medicine

## 2018-03-29 DIAGNOSIS — G4700 Insomnia, unspecified: Secondary | ICD-10-CM

## 2018-03-29 MED ORDER — ZOLPIDEM TARTRATE 5 MG PO TABS: 5.0000 mg | ORAL_TABLET | Freq: Every evening | ORAL | 1 refills | Status: DC | PRN

## 2018-03-29 NOTE — Telephone Encounter (Signed)
Pt is requesting refill on Ambien.   Last OV: 03/15/2018  Last Fill: 01/14/2018 #30 and 1RF UDS: 03/09/2017 Low risk

## 2018-05-05 ENCOUNTER — Other Ambulatory Visit: Payer: Self-pay | Admitting: Family Medicine

## 2018-05-05 ENCOUNTER — Encounter: Payer: Self-pay | Admitting: Family Medicine

## 2018-05-05 DIAGNOSIS — F411 Generalized anxiety disorder: Secondary | ICD-10-CM

## 2018-05-06 ENCOUNTER — Encounter: Payer: Self-pay | Admitting: Family Medicine

## 2018-05-06 MED ORDER — ESCITALOPRAM OXALATE 20 MG PO TABS: 20.0000 mg | ORAL_TABLET | Freq: Every day | ORAL | 1 refills | Status: DC

## 2018-05-06 MED ORDER — ROPINIROLE HCL 0.5 MG PO TABS: 0.5000 mg | ORAL_TABLET | Freq: Two times a day (BID) | ORAL | 1 refills | Status: DC

## 2018-05-19 DIAGNOSIS — H2511 Age-related nuclear cataract, right eye: Secondary | ICD-10-CM | POA: Diagnosis not present

## 2018-06-08 ENCOUNTER — Encounter: Payer: Self-pay | Admitting: Family Medicine

## 2018-06-08 ENCOUNTER — Other Ambulatory Visit: Payer: Self-pay | Admitting: Family Medicine

## 2018-06-08 DIAGNOSIS — S134XXA Sprain of ligaments of cervical spine, initial encounter: Secondary | ICD-10-CM

## 2018-06-08 DIAGNOSIS — L219 Seborrheic dermatitis, unspecified: Secondary | ICD-10-CM

## 2018-06-08 MED ORDER — ALENDRONATE SODIUM 70 MG PO TABS: ORAL_TABLET | ORAL | 3 refills | Status: DC

## 2018-06-08 MED ORDER — CYCLOBENZAPRINE HCL 10 MG PO TABS: 10.0000 mg | ORAL_TABLET | Freq: Two times a day (BID) | ORAL | 1 refills | Status: DC | PRN

## 2018-06-09 MED ORDER — FLUOCINOLONE ACETONIDE 0.01 % EX SHAM: MEDICATED_SHAMPOO | CUTANEOUS | 0 refills | Status: DC

## 2018-06-10 ENCOUNTER — Encounter: Payer: Self-pay | Admitting: Family Medicine

## 2018-06-10 ENCOUNTER — Other Ambulatory Visit: Payer: Self-pay | Admitting: Family Medicine

## 2018-06-11 ENCOUNTER — Encounter: Payer: Self-pay | Admitting: Family Medicine

## 2018-06-13 ENCOUNTER — Other Ambulatory Visit: Payer: Self-pay | Admitting: Family Medicine

## 2018-06-13 DIAGNOSIS — G4700 Insomnia, unspecified: Secondary | ICD-10-CM

## 2018-06-14 ENCOUNTER — Other Ambulatory Visit: Payer: Self-pay | Admitting: Family Medicine

## 2018-06-14 ENCOUNTER — Encounter: Payer: Self-pay | Admitting: Family Medicine

## 2018-06-14 DIAGNOSIS — E785 Hyperlipidemia, unspecified: Secondary | ICD-10-CM

## 2018-06-14 DIAGNOSIS — G4700 Insomnia, unspecified: Secondary | ICD-10-CM

## 2018-06-14 MED ORDER — ZOLPIDEM TARTRATE 5 MG PO TABS: 5.0000 mg | ORAL_TABLET | Freq: Every evening | ORAL | 1 refills | Status: DC | PRN

## 2018-06-14 MED ORDER — FENOFIBRATE 160 MG PO TABS: 160.0000 mg | ORAL_TABLET | Freq: Every day | ORAL | 1 refills | Status: DC

## 2018-06-14 NOTE — Telephone Encounter (Signed)
Sent in----  Saw on database she is getting tramadol and lyrica from separate drs.  ----  She can not take lyrica , ambien and tramadol together

## 2018-06-14 NOTE — Telephone Encounter (Signed)
Last written: 03/29/18 Last ov: 03/15/18 Next ov: none Contract: need UDS: need

## 2018-06-16 ENCOUNTER — Encounter: Payer: Self-pay | Admitting: Family Medicine

## 2018-06-17 ENCOUNTER — Telehealth: Payer: Self-pay

## 2018-06-17 DIAGNOSIS — M545 Low back pain: Secondary | ICD-10-CM | POA: Diagnosis not present

## 2018-06-17 DIAGNOSIS — G894 Chronic pain syndrome: Secondary | ICD-10-CM | POA: Diagnosis not present

## 2018-06-17 NOTE — Telephone Encounter (Signed)
PA initiated via Covermymeds; KEY: [id number]. Awaiting determination.

## 2018-06-20 NOTE — Telephone Encounter (Signed)
PA denied. Preferred alternatives: triamcinolone acetonide lotion, mometasone topical solution and betamethasone valerate lotion.

## 2018-06-20 NOTE — Telephone Encounter (Signed)
MyChart message sent to Pt- awaiting response.

## 2018-06-20 NOTE — Telephone Encounter (Signed)
Mometasone solution apply qd x 2 weeks Pt needs to be informed prior to sending it in--- -please call her

## 2018-06-22 ENCOUNTER — Other Ambulatory Visit: Payer: Self-pay | Admitting: Family Medicine

## 2018-06-22 MED ORDER — BETAMETHASONE VALERATE 0.1 % EX OINT: 1.0000 "application " | TOPICAL_OINTMENT | Freq: Two times a day (BID) | CUTANEOUS | 0 refills | Status: DC

## 2018-06-24 ENCOUNTER — Encounter: Payer: Self-pay | Admitting: Family Medicine

## 2018-07-08 ENCOUNTER — Encounter: Payer: Self-pay | Admitting: Family Medicine

## 2018-07-11 ENCOUNTER — Other Ambulatory Visit: Payer: Self-pay

## 2018-07-11 ENCOUNTER — Telehealth: Payer: Self-pay | Admitting: Family Medicine

## 2018-07-11 ENCOUNTER — Encounter: Payer: Self-pay | Admitting: Family Medicine

## 2018-07-11 ENCOUNTER — Ambulatory Visit (INDEPENDENT_AMBULATORY_CARE_PROVIDER_SITE_OTHER): Payer: Medicare HMO | Admitting: Family Medicine

## 2018-07-11 DIAGNOSIS — Z20828 Contact with and (suspected) exposure to other viral communicable diseases: Secondary | ICD-10-CM

## 2018-07-11 DIAGNOSIS — R509 Fever, unspecified: Secondary | ICD-10-CM | POA: Diagnosis not present

## 2018-07-11 DIAGNOSIS — Z20822 Contact with and (suspected) exposure to covid-19: Secondary | ICD-10-CM

## 2018-07-11 NOTE — Assessment & Plan Note (Addendum)
covid testing pending  Tylenol/ IB prn

## 2018-07-11 NOTE — Assessment & Plan Note (Signed)
covid testing pending  Pt to quarantine

## 2018-07-11 NOTE — Progress Notes (Signed)
Virtual Visit via Video Note  I connected with [name] on 07/11/18 at 11:00 AM EDT by a video enabled telemedicine application and verified that I am speaking with the correct person using two identifiers.  Location: Patient: home  Provider: office   I discussed the limitations of evaluation and management by telemedicine and the availability of in person appointments. The patient expressed understanding and agreed to proceed.  History of Present Illness: Pt has had fever for last 2 weeks -- no other symptoms    Observations/Objective: 101-103  Fevers  Now 99   No other symptoms  Assessment and Plan: 1. Fever, unspecified fever cause Tylenol/ IB prn Rest covid testing pending   2. Exposure to Covid-19 Virus Testing pending    Follow Up Instructions:    I discussed the assessment and treatment plan with the patient. The patient was provided an opportunity to ask questions and all were answered. The patient agreed with the plan and demonstrated an understanding of the instructions.   The patient was advised to call back or seek an in-person evaluation if the symptoms worsen or if the condition fails to improve as anticipated.  I provided 15 minutes of non-face-to-face time during this encounter.   [name], DO

## 2018-07-11 NOTE — Telephone Encounter (Signed)
Per patient's request, scheduled her for Thursday 07/14/2018 at 10:30 am for COVID 19 testing at [facility].  Testing protocol reviewed.

## 2018-07-11 NOTE — Telephone Encounter (Signed)
Fever x 2 weeks

## 2018-07-12 ENCOUNTER — Encounter: Payer: Self-pay | Admitting: Family Medicine

## 2018-07-13 DIAGNOSIS — U071 COVID-19: Secondary | ICD-10-CM

## 2018-07-13 HISTORY — DX: COVID-19: U07.1

## 2018-07-14 ENCOUNTER — Other Ambulatory Visit: Payer: Medicare HMO

## 2018-07-14 DIAGNOSIS — R6889 Other general symptoms and signs: Secondary | ICD-10-CM | POA: Diagnosis not present

## 2018-07-14 DIAGNOSIS — Z20822 Contact with and (suspected) exposure to covid-19: Secondary | ICD-10-CM

## 2018-07-17 ENCOUNTER — Encounter: Payer: Self-pay | Admitting: Family Medicine

## 2018-07-20 LAB — NOVEL CORONAVIRUS, NAA: SARS-CoV-2, NAA: NOT DETECTED

## 2018-07-22 ENCOUNTER — Encounter: Payer: Self-pay | Admitting: Family Medicine

## 2018-07-22 ENCOUNTER — Telehealth: Payer: Self-pay

## 2018-07-22 ENCOUNTER — Other Ambulatory Visit: Payer: Self-pay

## 2018-07-22 ENCOUNTER — Ambulatory Visit (INDEPENDENT_AMBULATORY_CARE_PROVIDER_SITE_OTHER): Payer: Medicare HMO | Admitting: Family Medicine

## 2018-07-22 VITALS — BP 124/74 | HR 72 | Temp 98.7°F | Resp 14 | Ht <= 58 in | Wt 154.0 lb

## 2018-07-22 DIAGNOSIS — R635 Abnormal weight gain: Secondary | ICD-10-CM

## 2018-07-22 DIAGNOSIS — R220 Localized swelling, mass and lump, head: Secondary | ICD-10-CM | POA: Diagnosis not present

## 2018-07-22 DIAGNOSIS — K047 Periapical abscess without sinus: Secondary | ICD-10-CM | POA: Diagnosis not present

## 2018-07-22 LAB — TSH: TSH: 2.9 u[IU]/mL (ref 0.35–4.50)

## 2018-07-22 LAB — T4, FREE: Free T4: 0.72 ng/dL (ref 0.60–1.60)

## 2018-07-22 LAB — VITAMIN B12: Vitamin B-12: 1090 pg/mL — ABNORMAL HIGH (ref 211–911)

## 2018-07-22 MED ORDER — PREDNISONE 20 MG PO TABS: 40.0000 mg | ORAL_TABLET | Freq: Every day | ORAL | 0 refills | Status: AC

## 2018-07-22 MED ORDER — AMOXICILLIN 500 MG PO CAPS: 500.0000 mg | ORAL_CAPSULE | Freq: Two times a day (BID) | ORAL | 0 refills | Status: AC
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
## 2018-07-22 NOTE — Patient Instructions (Signed)
If you do not hear anything about your referral in the next 1-2 weeks, call our office and ask for an update.  Give [place] 2-3 business days to get the results of your labs back.   Follow up with your dentist.  Take a daily antihistamine.  If you get worsening shortness of breath, seek care.  Let us know if you need anything.

## 2018-07-22 NOTE — Telephone Encounter (Signed)
Copied from [place] [id number]. Topic: General - Inquiry >> Jul 22, 2018 11:09 AM Rayann Heman wrote: Reason for CRM: pt called and state that she may have left big silver cup at office. Please advise

## 2018-07-24 ENCOUNTER — Other Ambulatory Visit: Payer: Self-pay | Admitting: Family Medicine

## 2018-07-24 DIAGNOSIS — Z20828 Contact with and (suspected) exposure to other viral communicable diseases: Secondary | ICD-10-CM | POA: Diagnosis not present

## 2018-07-24 MED ORDER — CLINDAMYCIN HCL 150 MG PO CAPS: 150.0000 mg | ORAL_CAPSULE | Freq: Four times a day (QID) | ORAL | 0 refills | Status: DC

## 2018-07-25 ENCOUNTER — Encounter: Payer: Self-pay | Admitting: Family Medicine

## 2018-07-26 ENCOUNTER — Encounter: Payer: Self-pay | Admitting: Family Medicine

## 2018-07-27 ENCOUNTER — Encounter: Payer: Self-pay | Admitting: Family Medicine

## 2018-07-28 NOTE — Telephone Encounter (Signed)
Copied from [place] [id number]. Topic: General - Other >> Jul 28, 2018 10:25 AM Keene Breath wrote: Reason for CRM: Patient called to schedule an appt.  CB# [phone]   Pt can not come into office with a fever-----  If she is worried about osteomyelitis she may need iv abx  But she should go to ER

## 2018-07-28 NOTE — Telephone Encounter (Signed)
We can not bring her in here with those fevers--- she needs to go to the ER

## 2018-07-29 ENCOUNTER — Encounter (HOSPITAL_COMMUNITY): Payer: Self-pay | Admitting: Emergency Medicine

## 2018-07-29 ENCOUNTER — Other Ambulatory Visit: Payer: Self-pay

## 2018-07-29 ENCOUNTER — Ambulatory Visit (HOSPITAL_COMMUNITY)
Admission: EM | Admit: 2018-07-29 | Discharge: 2018-07-29 | Disposition: A | Discharge status: Home / Self Care | Payer: Medicare HMO | Attending: Emergency Medicine | Admitting: Emergency Medicine

## 2018-07-29 DIAGNOSIS — R509 Fever, unspecified: Secondary | ICD-10-CM | POA: Insufficient documentation

## 2018-07-29 DIAGNOSIS — F418 Other specified anxiety disorders: Secondary | ICD-10-CM | POA: Diagnosis not present

## 2018-07-29 DIAGNOSIS — K0889 Other specified disorders of teeth and supporting structures: Secondary | ICD-10-CM | POA: Diagnosis not present

## 2018-07-29 LAB — CBC WITH DIFFERENTIAL/PLATELET
Abs Immature Granulocytes: 0.08 10*3/uL — ABNORMAL HIGH (ref 0.00–0.07)
Basophils Absolute: 0 10*3/uL (ref 0.0–0.1)
Basophils Relative: 0 %
Eosinophils Absolute: 0.1 10*3/uL (ref 0.0–0.5)
Eosinophils Relative: 1 %
HCT: 41.7 % (ref 36.0–46.0)
Hemoglobin: 13.6 g/dL (ref 12.0–15.0)
Immature Granulocytes: 1 %
Lymphocytes Relative: 15 %
Lymphs Abs: 1.7 10*3/uL (ref 0.7–4.0)
MCH: 28.4 pg (ref 26.0–34.0)
MCHC: 32.6 g/dL (ref 30.0–36.0)
MCV: 87.1 fL (ref 80.0–100.0)
Monocytes Absolute: 0.3 10*3/uL (ref 0.1–1.0)
Monocytes Relative: 3 %
Neutro Abs: 9.3 10*3/uL — ABNORMAL HIGH (ref 1.7–7.7)
Neutrophils Relative %: 80 %
Platelets: 397 10*3/uL (ref 150–400)
RBC: 4.79 MIL/uL (ref 3.87–5.11)
RDW: 12.3 % (ref 11.5–15.5)
WBC: 11.5 10*3/uL — ABNORMAL HIGH (ref 4.0–10.5)
nRBC: 0 % (ref 0.0–0.2)

## 2018-07-29 NOTE — Telephone Encounter (Signed)
Called and spoke to pt.  Continues to report neck edema/pain, tongue edema/pain, and intermittent fevers that increase with activity.  Instructed pt that we could not bring her into the office with her continuing to experience fevers. Advised pt to go to urgent care for further evaluation.  Pt verbalized understanding.
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
## 2018-07-29 NOTE — ED Triage Notes (Signed)
Pt states shes been tested twice for covid and both negative, pt states shes had off and on fever. Headache, neck pain, pt has been taking clindamycin due to possible tooth infection. Pt states "my forehead feels hot".

## 2018-07-30 ENCOUNTER — Encounter: Payer: Self-pay | Admitting: Family Medicine

## 2018-07-30 ENCOUNTER — Encounter (HOSPITAL_COMMUNITY): Payer: Self-pay | Admitting: Emergency Medicine

## 2018-07-30 NOTE — Discharge Instructions (Addendum)
Important to follow-up with PCP regarding further, more in-depth work-up given chronicity of your symptoms. We will contact you with results of your CBC. Go to ER for further evaluation for high fevers, severe muscle aches, chest pain, shortness of breath.

## 2018-07-31 ENCOUNTER — Encounter: Payer: Self-pay | Admitting: Family Medicine

## 2018-07-31 ENCOUNTER — Encounter (HOSPITAL_COMMUNITY): Payer: Self-pay | Admitting: Emergency Medicine

## 2018-08-01 ENCOUNTER — Encounter (HOSPITAL_COMMUNITY): Payer: Self-pay | Admitting: Emergency Medicine

## 2018-08-01 DIAGNOSIS — R509 Fever, unspecified: Secondary | ICD-10-CM | POA: Diagnosis not present

## 2018-08-01 NOTE — Telephone Encounter (Signed)
She has got to go to [facility] or [facility] where they can do blood work and see her----- we can not bring her in here with a fever [name] has spoken to her

## 2018-08-03 DIAGNOSIS — R509 Fever, unspecified: Secondary | ICD-10-CM | POA: Diagnosis not present

## 2018-08-03 DIAGNOSIS — M2550 Pain in unspecified joint: Secondary | ICD-10-CM | POA: Diagnosis not present

## 2018-08-03 NOTE — Telephone Encounter (Signed)
She can be seen at urgent care or ER She just can not be seen in our office---  We can only do virtual visit and we would not be able to do labs

## 2018-08-04 ENCOUNTER — Ambulatory Visit (HOSPITAL_BASED_OUTPATIENT_CLINIC_OR_DEPARTMENT_OTHER)
Admission: RE | Admit: 2018-08-04 | Discharge: 2018-08-04 | Disposition: A | Discharge status: Home / Self Care | Payer: Medicare HMO | Source: Ambulatory Visit | Attending: Internal Medicine | Admitting: Internal Medicine

## 2018-08-04 ENCOUNTER — Other Ambulatory Visit: Payer: Self-pay

## 2018-08-04 ENCOUNTER — Ambulatory Visit (INDEPENDENT_AMBULATORY_CARE_PROVIDER_SITE_OTHER): Payer: Medicare HMO | Admitting: Internal Medicine

## 2018-08-04 DIAGNOSIS — R509 Fever, unspecified: Secondary | ICD-10-CM | POA: Insufficient documentation
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
## 2018-08-05 ENCOUNTER — Encounter: Payer: Self-pay | Admitting: Family Medicine

## 2018-08-05 ENCOUNTER — Telehealth: Payer: Self-pay | Admitting: Family Medicine

## 2018-08-05 ENCOUNTER — Encounter: Payer: Self-pay | Admitting: Internal Medicine

## 2018-08-05 LAB — URINALYSIS, ROUTINE W REFLEX MICROSCOPIC
Bilirubin Urine: NEGATIVE
Hgb urine dipstick: NEGATIVE
Ketones, ur: NEGATIVE
Leukocytes,Ua: NEGATIVE
Nitrite: NEGATIVE
Specific Gravity, Urine: 1.01 (ref 1.000–1.030)
Total Protein, Urine: NEGATIVE
Urine Glucose: NEGATIVE
Urobilinogen, UA: 0.2 (ref 0.0–1.0)
pH: 7 (ref 5.0–8.0)

## 2018-08-05 LAB — UNLABELED: Test Ordered On Req: 395

## 2018-08-05 NOTE — Telephone Encounter (Signed)
She can have a note.

## 2018-08-05 NOTE — Telephone Encounter (Signed)
She has to at least 24 hours with out a temp (under 100) with out taking tylenol

## 2018-08-05 NOTE — Telephone Encounter (Signed)
She told dr Larose Kells she had a fever yesterday.-----  She needs to be fever free for 24 hours without tylenol  If she has been fever free for 24 hours and she has not taken tylenol or IB --- we can release her to work

## 2018-08-05 NOTE — Telephone Encounter (Signed)
Please advise 

## 2018-08-05 NOTE — Telephone Encounter (Signed)
Letter released to MyChart.

## 2018-08-05 NOTE — Telephone Encounter (Addendum)
Pt following up on request for work note.  Pt states her temp is 98.5 right now.  Pt states she has not had a temp in over 24 hrs.  Pt states she is positive this is a neck same issue she has had since Feb this yr and she discussed with the d then.  Pt requesting return to work note asap.  Pt states she needs by 5 pm today.  Please put on mychart .  Pt has spoke with Dr [name] and Dr Larose Kells about her issues.

## 2018-08-09 LAB — URINE CULTURE
MICRO NUMBER:: 706491
SPECIMEN QUALITY:: ADEQUATE

## 2018-08-09 LAB — PAT ID TIQ DOC: Test Affected: 395

## 2018-08-11 ENCOUNTER — Encounter: Payer: Self-pay | Admitting: Internal Medicine

## 2018-08-12 ENCOUNTER — Encounter: Payer: Self-pay | Admitting: Family Medicine

## 2018-08-12 ENCOUNTER — Ambulatory Visit: Payer: Medicare HMO | Admitting: Allergy

## 2018-08-12 NOTE — Telephone Encounter (Signed)
Ua/ culture---  Was considered neg.  There was some contamination but col count was not >100,000 col so considered negative Happy to repeat UA if she is having symptoms still  No sure what she is asking--- as far as chiropractor and mri

## 2018-08-12 NOTE — Telephone Encounter (Signed)
She needs ov 

## 2018-08-13 DIAGNOSIS — M542 Cervicalgia: Secondary | ICD-10-CM | POA: Diagnosis not present

## 2018-08-15 ENCOUNTER — Other Ambulatory Visit: Payer: Self-pay | Admitting: Family Medicine

## 2018-08-15 ENCOUNTER — Encounter: Payer: Self-pay | Admitting: Family Medicine

## 2018-08-15 DIAGNOSIS — G4700 Insomnia, unspecified: Secondary | ICD-10-CM

## 2018-08-15 NOTE — Telephone Encounter (Signed)
Requesting: Ambien Contract: 06/19/2016 UDS:03/09/2017, low risk, next screen 03/09/2018 Last OV: 08/04/2018 Next OV: 09/30/2018 Last Refill: 06/14/2018, #30--1 RF Database:   Please advise

## 2018-08-18 DIAGNOSIS — N390 Urinary tract infection, site not specified: Secondary | ICD-10-CM | POA: Diagnosis not present

## 2018-08-19 ENCOUNTER — Other Ambulatory Visit (HOSPITAL_COMMUNITY)
Admission: RE | Admit: 2018-08-19 | Discharge: 2018-08-19 | Disposition: A | Discharge status: Home / Self Care | Payer: Medicare HMO | Source: Ambulatory Visit | Attending: Family Medicine | Admitting: Family Medicine

## 2018-08-19 ENCOUNTER — Other Ambulatory Visit: Payer: Self-pay

## 2018-08-19 ENCOUNTER — Telehealth: Payer: Self-pay | Admitting: Radiology

## 2018-08-19 ENCOUNTER — Ambulatory Visit (INDEPENDENT_AMBULATORY_CARE_PROVIDER_SITE_OTHER): Payer: Medicare HMO | Admitting: Family Medicine

## 2018-08-19 ENCOUNTER — Encounter: Payer: Self-pay | Admitting: Family Medicine

## 2018-08-19 VITALS — BP 152/90 | HR 84 | Temp 99.2°F | Resp 16 | Ht <= 58 in | Wt 157.0 lb

## 2018-08-19 DIAGNOSIS — R509 Fever, unspecified: Secondary | ICD-10-CM

## 2018-08-19 DIAGNOSIS — R002 Palpitations: Secondary | ICD-10-CM

## 2018-08-19 DIAGNOSIS — R1013 Epigastric pain: Secondary | ICD-10-CM | POA: Diagnosis not present

## 2018-08-19 DIAGNOSIS — K219 Gastro-esophageal reflux disease without esophagitis: Secondary | ICD-10-CM

## 2018-08-19 DIAGNOSIS — N760 Acute vaginitis: Secondary | ICD-10-CM | POA: Insufficient documentation

## 2018-08-19 LAB — POC URINALSYSI DIPSTICK (AUTOMATED)
Bilirubin, UA: NEGATIVE
Blood, UA: NEGATIVE
Glucose, UA: NEGATIVE
Ketones, UA: NEGATIVE
Leukocytes, UA: NEGATIVE
Nitrite, UA: NEGATIVE
Protein, UA: NEGATIVE
Spec Grav, UA: 1.01 (ref 1.010–1.025)
Urobilinogen, UA: 0.2 E.U./dL
pH, UA: 7.5 (ref 5.0–8.0)

## 2018-08-19 LAB — COMPREHENSIVE METABOLIC PANEL
ALT: 19 U/L (ref 0–35)
AST: 18 U/L (ref 0–37)
Albumin: 4.6 g/dL (ref 3.5–5.2)
Alkaline Phosphatase: 72 U/L (ref 39–117)
BUN: 18 mg/dL (ref 6–23)
CO2: 27 mEq/L (ref 19–32)
Calcium: 10.3 mg/dL (ref 8.4–10.5)
Chloride: 102 mEq/L (ref 96–112)
Creatinine, Ser: 0.8 mg/dL (ref 0.40–1.20)
GFR: 71.2 mL/min (ref >60.00)
Glucose, Bld: 100 mg/dL — ABNORMAL HIGH (ref 70–99)
Potassium: 4.6 mEq/L (ref 3.5–5.1)
Sodium: 139 mEq/L (ref 135–145)
Total Bilirubin: 0.4 mg/dL (ref 0.2–1.2)
Total Protein: 7.2 g/dL (ref 6.0–8.3)

## 2018-08-19 LAB — CBC WITH DIFFERENTIAL/PLATELET
Basophils Absolute: 0.1 10*3/uL (ref 0.0–0.1)
Basophils Relative: 0.6 % (ref 0.0–3.0)
Eosinophils Absolute: 0.4 10*3/uL (ref 0.0–0.7)
Eosinophils Relative: 3.4 % (ref 0.0–5.0)
HCT: 42.9 % (ref 36.0–46.0)
Hemoglobin: 13.9 g/dL (ref 12.0–15.0)
Lymphocytes Relative: 30.3 % (ref 12.0–46.0)
Lymphs Abs: 3.2 10*3/uL (ref 0.7–4.0)
MCHC: 32.5 g/dL (ref 30.0–36.0)
MCV: 87.3 fl (ref 78.0–100.0)
Monocytes Absolute: 0.8 10*3/uL (ref 0.1–1.0)
Monocytes Relative: 7.8 % (ref 3.0–12.0)
Neutro Abs: 6.1 10*3/uL (ref 1.4–7.7)
Neutrophils Relative %: 57.9 % (ref 43.0–77.0)
Platelets: 384 10*3/uL (ref 150.0–400.0)
RBC: 4.91 Mil/uL (ref 3.87–5.11)
RDW: 13.2 % (ref 11.5–15.5)
WBC: 10.5 10*3/uL (ref 4.0–10.5)

## 2018-08-19 LAB — H. PYLORI ANTIBODY, IGG: H Pylori IgG: NEGATIVE

## 2018-08-19 NOTE — Patient Instructions (Signed)
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
## 2018-08-19 NOTE — Telephone Encounter (Signed)
Enrolled patient for a 30 day Preventcie Event monitor to be mailed. Brief instructions were gone over and patient knows to expect the monitor to arrive in 3-4 days.

## 2018-08-20 ENCOUNTER — Encounter: Payer: Self-pay | Admitting: Family Medicine

## 2018-08-20 DIAGNOSIS — R002 Palpitations: Secondary | ICD-10-CM | POA: Insufficient documentation

## 2018-08-20 DIAGNOSIS — N760 Acute vaginitis: Secondary | ICD-10-CM | POA: Insufficient documentation

## 2018-08-20 LAB — URINE CYTOLOGY ANCILLARY ONLY
Chlamydia: NEGATIVE
Neisseria Gonorrhea: NEGATIVE
Trichomonas: NEGATIVE

## 2018-08-20 NOTE — Assessment & Plan Note (Signed)
Seems to have stopped but pt states it comes and goes Check laba

## 2018-08-20 NOTE — Assessment & Plan Note (Signed)
Check urine for ua and ancillary

## 2018-08-20 NOTE — Assessment & Plan Note (Signed)
Check echo and event monitor Check labs She may need cardiology

## 2018-08-20 NOTE — Assessment & Plan Note (Signed)
Pt needs to fu with GI

## 2018-08-21 ENCOUNTER — Encounter: Payer: Self-pay | Admitting: Family Medicine

## 2018-08-22 ENCOUNTER — Other Ambulatory Visit: Payer: Self-pay | Admitting: Family Medicine

## 2018-08-22 DIAGNOSIS — K1379 Other lesions of oral mucosa: Secondary | ICD-10-CM

## 2018-08-22 NOTE — Telephone Encounter (Signed)
What would you like to do with her.  I know she needs to be seen but she has so many mychart about different things and she was just seen last Friday.

## 2018-08-22 NOTE — Telephone Encounter (Signed)
I put referral in for ent Please send her list of psych

## 2018-08-24 NOTE — Telephone Encounter (Signed)
If the pain she is talking about is her stomach--- we discussed her talking to her GI I would start there  Her temp is low grade--- don't think ID would see her but if GI feels there is no GI reason for fever we could try that because everything else is neg

## 2018-08-25 ENCOUNTER — Other Ambulatory Visit: Payer: Self-pay | Admitting: Family Medicine

## 2018-08-25 ENCOUNTER — Other Ambulatory Visit: Payer: Self-pay

## 2018-08-25 ENCOUNTER — Ambulatory Visit (HOSPITAL_COMMUNITY): Payer: Medicare HMO | Attending: Cardiology

## 2018-08-25 DIAGNOSIS — R002 Palpitations: Secondary | ICD-10-CM

## 2018-08-25 DIAGNOSIS — E785 Hyperlipidemia, unspecified: Secondary | ICD-10-CM | POA: Insufficient documentation

## 2018-08-25 DIAGNOSIS — Z8619 Personal history of other infectious and parasitic diseases: Secondary | ICD-10-CM | POA: Diagnosis not present

## 2018-08-25 DIAGNOSIS — R931 Abnormal findings on diagnostic imaging of heart and coronary circulation: Secondary | ICD-10-CM | POA: Diagnosis not present

## 2018-08-25 DIAGNOSIS — G4730 Sleep apnea, unspecified: Secondary | ICD-10-CM | POA: Insufficient documentation

## 2018-08-25 DIAGNOSIS — N760 Acute vaginitis: Secondary | ICD-10-CM

## 2018-08-25 DIAGNOSIS — E669 Obesity, unspecified: Secondary | ICD-10-CM | POA: Insufficient documentation

## 2018-08-25 DIAGNOSIS — B9689 Other specified bacterial agents as the cause of diseases classified elsewhere: Secondary | ICD-10-CM

## 2018-08-25 LAB — URINE CYTOLOGY ANCILLARY ONLY
Bacterial vaginitis: POSITIVE — AB
Candida vaginitis: NEGATIVE

## 2018-08-25 MED ORDER — METRONIDAZOLE 500 MG PO TABS: 500.0000 mg | ORAL_TABLET | Freq: Two times a day (BID) | ORAL | 0 refills | Status: DC

## 2018-08-26 ENCOUNTER — Ambulatory Visit (INDEPENDENT_AMBULATORY_CARE_PROVIDER_SITE_OTHER): Payer: Medicare HMO

## 2018-08-26 DIAGNOSIS — R002 Palpitations: Secondary | ICD-10-CM

## 2018-08-27 LAB — CULTURE BLOOD MANUAL
Micro Number: 749305
Result: NO GROWTH
Specimen Quality: ADEQUATE

## 2018-08-27 LAB — THYROID PEROXIDASE ANTIBODY: Thyroperoxidase Ab SerPl-aCnc: 61 IU/mL — ABNORMAL HIGH (ref <9)

## 2018-08-27 LAB — THYROID PANEL WITH TSH
Free Thyroxine Index: 1.9 (ref 1.4–3.8)
T3 Uptake: 32 % (ref 22–35)
T4, Total: 5.9 ug/dL (ref 5.1–11.9)
TSH: 1.83 mIU/L (ref 0.40–4.50)

## 2018-08-28 ENCOUNTER — Other Ambulatory Visit: Payer: Self-pay | Admitting: Family Medicine

## 2018-08-28 ENCOUNTER — Encounter: Payer: Self-pay | Admitting: Family Medicine

## 2018-08-28 DIAGNOSIS — R7989 Other specified abnormal findings of blood chemistry: Secondary | ICD-10-CM

## 2018-08-29 DIAGNOSIS — R269 Unspecified abnormalities of gait and mobility: Secondary | ICD-10-CM | POA: Diagnosis not present

## 2018-08-29 DIAGNOSIS — M542 Cervicalgia: Secondary | ICD-10-CM | POA: Diagnosis not present

## 2018-08-30 ENCOUNTER — Ambulatory Visit (INDEPENDENT_AMBULATORY_CARE_PROVIDER_SITE_OTHER): Payer: Medicare HMO | Admitting: Cardiology

## 2018-08-30 ENCOUNTER — Encounter: Payer: Self-pay | Admitting: Family Medicine

## 2018-08-30 ENCOUNTER — Encounter: Payer: Self-pay | Admitting: Cardiology

## 2018-08-30 ENCOUNTER — Other Ambulatory Visit: Payer: Self-pay

## 2018-08-30 DIAGNOSIS — R0602 Shortness of breath: Secondary | ICD-10-CM | POA: Insufficient documentation

## 2018-08-30 DIAGNOSIS — Q211 Atrial septal defect: Secondary | ICD-10-CM | POA: Diagnosis not present

## 2018-08-30 DIAGNOSIS — R0789 Other chest pain: Secondary | ICD-10-CM

## 2018-08-30 DIAGNOSIS — R0609 Other forms of dyspnea: Secondary | ICD-10-CM

## 2018-08-30 DIAGNOSIS — Q2112 Patent foramen ovale: Secondary | ICD-10-CM
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
## 2018-08-30 NOTE — Telephone Encounter (Signed)
bv was in urine Thyroid tests in my chart

## 2018-08-30 NOTE — Patient Instructions (Signed)
Medication Instructions:  Your physician recommends that you continue on your current medications as directed. Please refer to the Current Medication list given to you today.  If you need a refill on your cardiac medications before your next appointment, please call your pharmacy.   Lab work: None If you have labs (blood work) drawn today and your tests are completely normal, you will receive your results only by: Marland Kitchen MyChart Message (if you have MyChart) OR . A paper copy in the mail If you have any lab test that is abnormal or we need to change your treatment, we will call you to review the results.  Testing/Procedures: NONE   Follow-Up: At [facility], you and your health needs are our priority.  As part of our continuing mission to provide you with exceptional heart care, we have created designated Provider Care Teams.  These Care Teams include your primary Cardiologist (physician) and Advanced Practice Providers (APPs -  Physician Assistants and Nurse Practitioners) who all work together to provide you with the care you need, when you need it. You will need a follow up appointment in 1 months.  Please call our office 2 months in advance to schedule this appointment.  You may see No primary care provider on file.or another member of our [employer] Provider Team in [place]: Shirlee More, MD . Jyl Heinz, MD  Any Other Special Instructions Will Be Listed Below (If Applicable).

## 2018-08-31 ENCOUNTER — Encounter: Payer: Self-pay | Admitting: Family Medicine

## 2018-09-01 NOTE — Telephone Encounter (Signed)
The referral was put in on the 16 I have sent a message to [facility] to check on it This is the first i'm hearing about the swollen tongue We can send her to ENT if she agrees

## 2018-09-04 ENCOUNTER — Encounter: Payer: Self-pay | Admitting: Family Medicine

## 2018-09-05 NOTE — Telephone Encounter (Signed)
She shouldn't if she has seen them before but she needs to ask them that

## 2018-09-07 DIAGNOSIS — M542 Cervicalgia: Secondary | ICD-10-CM | POA: Diagnosis not present

## 2018-09-07 DIAGNOSIS — R269 Unspecified abnormalities of gait and mobility: Secondary | ICD-10-CM | POA: Diagnosis not present

## 2018-09-09 ENCOUNTER — Other Ambulatory Visit: Payer: Self-pay

## 2018-09-09 DIAGNOSIS — M542 Cervicalgia: Secondary | ICD-10-CM | POA: Diagnosis not present

## 2018-09-14 ENCOUNTER — Ambulatory Visit: Payer: Medicare HMO | Admitting: Internal Medicine

## 2018-09-14 ENCOUNTER — Other Ambulatory Visit: Payer: Self-pay

## 2018-09-14 ENCOUNTER — Encounter: Payer: Self-pay | Admitting: Internal Medicine

## 2018-09-14 VITALS — BP 124/74 | HR 88 | Temp 98.5°F | Ht <= 58 in | Wt 160.4 lb

## 2018-09-14 DIAGNOSIS — R220 Localized swelling, mass and lump, head: Secondary | ICD-10-CM | POA: Diagnosis not present

## 2018-09-14 DIAGNOSIS — E063 Autoimmune thyroiditis: Secondary | ICD-10-CM

## 2018-09-14 DIAGNOSIS — R509 Fever, unspecified: Secondary | ICD-10-CM | POA: Diagnosis not present

## 2018-09-14 NOTE — Patient Instructions (Addendum)
-   You have  Hashimoto's Disease which is an immune condition that affects your thyroid, you have a 5% change every year of having an underactive thyroid.  - Please have annual thyroid function checks at your primary care physician's office    - If you have a fever > 100 F, and no one could find a source of infection, you may need to see an infectious disease specialist for "fever or unknown origin "   - I am concerned about your recurring tongue swelling, I suggest you see an immunologist to check your complement system
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
## 2018-09-15 DIAGNOSIS — K435 Parastomal hernia without obstruction or  gangrene: Secondary | ICD-10-CM | POA: Diagnosis not present

## 2018-09-15 DIAGNOSIS — K449 Diaphragmatic hernia without obstruction or gangrene: Secondary | ICD-10-CM | POA: Diagnosis not present

## 2018-09-15 DIAGNOSIS — K219 Gastro-esophageal reflux disease without esophagitis: Secondary | ICD-10-CM | POA: Diagnosis not present

## 2018-09-15 DIAGNOSIS — K5903 Drug induced constipation: Secondary | ICD-10-CM | POA: Diagnosis not present

## 2018-09-19 ENCOUNTER — Other Ambulatory Visit: Payer: Self-pay | Admitting: Family Medicine

## 2018-09-19 ENCOUNTER — Encounter: Payer: Self-pay | Admitting: Family Medicine

## 2018-09-19 DIAGNOSIS — G4700 Insomnia, unspecified: Secondary | ICD-10-CM

## 2018-09-20 ENCOUNTER — Encounter: Payer: Self-pay | Admitting: Family Medicine

## 2018-09-20 MED ORDER — ZOLPIDEM TARTRATE 5 MG PO TABS: 5.0000 mg | ORAL_TABLET | Freq: Every evening | ORAL | 0 refills | Status: DC | PRN

## 2018-09-20 NOTE — Telephone Encounter (Signed)
Requesting: Ambien Contract: 06/19/2016 UDS: 03/09/2017, low risk, next screening 03/09/2018 Last OV: 08/19/2018 Next OV: 09/30/2018 Last Refill: 08/16/2018, #30-- 0 RF Database:   Please advise

## 2018-09-29 ENCOUNTER — Other Ambulatory Visit: Payer: Self-pay

## 2018-09-30 ENCOUNTER — Other Ambulatory Visit (HOSPITAL_COMMUNITY)
Admission: RE | Admit: 2018-09-30 | Discharge: 2018-09-30 | Disposition: A | Discharge status: Home / Self Care | Payer: Medicare HMO | Source: Ambulatory Visit | Attending: Family Medicine | Admitting: Family Medicine

## 2018-09-30 ENCOUNTER — Encounter: Payer: Self-pay | Admitting: Family Medicine

## 2018-09-30 ENCOUNTER — Ambulatory Visit (INDEPENDENT_AMBULATORY_CARE_PROVIDER_SITE_OTHER): Payer: Medicare HMO | Admitting: Family Medicine

## 2018-09-30 VITALS — BP 124/60 | HR 85 | Temp 98.0°F | Resp 18 | Ht <= 58 in | Wt 158.8 lb

## 2018-09-30 DIAGNOSIS — E785 Hyperlipidemia, unspecified: Secondary | ICD-10-CM

## 2018-09-30 DIAGNOSIS — E063 Autoimmune thyroiditis: Secondary | ICD-10-CM | POA: Diagnosis not present

## 2018-09-30 DIAGNOSIS — K589 Irritable bowel syndrome without diarrhea: Secondary | ICD-10-CM | POA: Diagnosis not present

## 2018-09-30 DIAGNOSIS — Z0000 Encounter for general adult medical examination without abnormal findings: Secondary | ICD-10-CM | POA: Diagnosis not present

## 2018-09-30 DIAGNOSIS — Z89429 Acquired absence of other toe(s), unspecified side: Secondary | ICD-10-CM | POA: Diagnosis not present

## 2018-09-30 DIAGNOSIS — Z111 Encounter for screening for respiratory tuberculosis: Secondary | ICD-10-CM

## 2018-09-30 DIAGNOSIS — Z23 Encounter for immunization: Secondary | ICD-10-CM | POA: Diagnosis not present

## 2018-09-30 DIAGNOSIS — E1169 Type 2 diabetes mellitus with other specified complication: Secondary | ICD-10-CM

## 2018-09-30 DIAGNOSIS — K219 Gastro-esophageal reflux disease without esophagitis: Secondary | ICD-10-CM

## 2018-09-30 DIAGNOSIS — N760 Acute vaginitis: Secondary | ICD-10-CM | POA: Diagnosis not present

## 2018-09-30 DIAGNOSIS — K439 Ventral hernia without obstruction or gangrene: Secondary | ICD-10-CM

## 2018-09-30 MED ORDER — HYOSCYAMINE SULFATE 0.125 MG SL SUBL: 0.1250 mg | SUBLINGUAL_TABLET | SUBLINGUAL | 1 refills | Status: DC | PRN

## 2018-09-30 NOTE — Progress Notes (Signed)
Subjective:     [name] is a 68 y.o. female and is here for a comprehensive physical exam. The patient reports problems - recurrent bv .  She has d/c again She also c/o hernia that GI dx and she wants to see a surgeon in [place] and she would like a referral to a different GI in [place] as well   Social History   Socioeconomic History  . Marital status: Divorced    Spouse name: Not on file  . Number of children: Not on file  . Years of education: Not on file  . Highest education level: Not on file  Occupational History  . Occupation: school nurse  Social Needs  . Financial resource strain: Not on file  . Food insecurity    Worry: Not on file    Inability: Not on file  . Transportation needs    Medical: Not on file    Non-medical: Not on file  Tobacco Use  . Smoking status: Former Smoker    Packs/day: 0.25    Years: 20.00    Pack years: 5.00    Types: Cigarettes    Quit date: 01/13/1988    Years since quitting: 30.7  . Smokeless tobacco: Never Used  Substance and Sexual Activity  . Alcohol use: Yes    Alcohol/week: 0.0 standard drinks    Comment: occasionally  . Drug use: No  . Sexual activity: Yes    Partners: Male  Lifestyle  . Physical activity    Days per week: Not on file    Minutes per session: Not on file  . Stress: Not on file  Relationships  . Social Herbalist on phone: Not on file    Gets together: Not on file    Attends religious service: Not on file    Active member of club or organization: Not on file    Attends meetings of clubs or organizations: Not on file    Relationship status: Not on file  . Intimate partner violence    Fear of current or ex partner: Not on file    Emotionally abused: Not on file    Physically abused: Not on file    Forced sexual activity: Not on file  Other Topics Concern  . Not on file  Social History Narrative  . Not on file   Health Maintenance  Topic Date Due  . URINE MICROALBUMIN   01/14/1960  . MAMMOGRAM  12/09/2017  . PNA vac Low Risk Adult (2 of 2 - PPSV23) 03/16/2018  . TETANUS/TDAP  09/21/2020  . COLONOSCOPY  04/29/2027  . INFLUENZA VACCINE  Completed  . DEXA SCAN  Completed  . Hepatitis C Screening  Completed    The following portions of the patient's history were reviewed and updated as appropriate:  She  has a past medical history of ADD (attention deficit disorder), Anxiety, Bronchitis, Cataract of left eye, Depression, Fibromyalgia, GERD (gastroesophageal reflux disease), Hepatitis, Hyperlipemia, IBS (irritable bowel syndrome), Osteoarthritis, Osteoporosis, Pinched nerve in neck, and Whiplash. She does not have any pertinent problems on file. She  has a past surgical history that includes Cholecystectomy; Cesarean section; Tonsillectomy; Total knee arthroplasty (Right, 2009); Toe amputation (2007); Ventral hernia repair (2007); Carpal tunnel release (Bilateral); Bladder suspension (2001); Abdominal hysterectomy (2001); Upper gi endoscopy; Nose surgery (Left, 2007); Breast surgery; Total knee arthroplasty (Left, 12/26/2012); Hernia repair; and Cataract extraction w/PHACO (Left, 07/21/2016). Her family history includes Arthritis in her mother; Cancer in her maternal grandfather  and maternal uncle; Heart disease in her mother; Hypertension in her maternal grandmother and paternal grandmother; Lung cancer in her paternal grandfather; Prostate cancer in her paternal grandfather. She  reports that she quit smoking about 30 years ago. Her smoking use included cigarettes. She has a 5.00 pack-year smoking history. She has never used smokeless tobacco. She reports current alcohol use. She reports that she does not use drugs. She has a current medication list which includes the following prescription(s): alendronate, calcium-magnesium-vitamin d, conjugated estrogens, cyclobenzaprine, dss, epinephrine, escitalopram, fenofibrate, fluocinolone acetonide, fluticasone,  hydrochlorothiazide, hyoscyamine, lyrica, multivitamin with minerals, niacin, fish oil, omeprazole, OVER THE COUNTER MEDICATION, polyethylene glycol, ropinirole, tramadol, valacyclovir, ventolin hfa, zolpidem, and metronidazole. Current Outpatient Medications on File Prior to Visit  Medication Sig Dispense Refill  . alendronate (FOSAMAX) 70 MG tablet TAKE 1 TABLET EVERY WEEK. TAKE WITH FULL GLASS OF WATER ON AN EMPTY STOMACH 12 tablet 3  . Calcium-Magnesium-Vitamin D [id number] MG-MG-UNIT TABS Take 1 tablet by mouth daily.    Marland Kitchen conjugated estrogens (PREMARIN) vaginal cream 0.5 g  2-3 days a week 42.5 g 0  . cyclobenzaprine (FLEXERIL) 10 MG tablet Take 1 tablet (10 mg total) by mouth 2 (two) times daily as needed for muscle spasms. 180 tablet 1  . docusate sodium 100 MG CAPS Take 100 mg by mouth 2 (two) times daily. 10 capsule 0  . EPINEPHrine (EPIPEN 2-PAK) 0.3 mg/0.3 mL IJ SOAJ injection As directed 2 Device 0  . escitalopram (LEXAPRO) 20 MG tablet Take 1 tablet (20 mg total) by mouth daily. 90 tablet 1  . fenofibrate 160 MG tablet Take 1 tablet (160 mg total) by mouth daily. 90 tablet 1  . Fluocinolone Acetonide (CAPEX) 0.01 % SHAM Massage in scalp and leave on 5 min then rinse 1 Bottle 0  . fluticasone (FLONASE) 50 MCG/ACT nasal spray Place 2 sprays into both nostrils daily. 16 g 3  . hydrochlorothiazide (HYDRODIURIL) 25 MG tablet Take 1 tablet (25 mg total) by mouth daily. 90 tablet 1  . LYRICA 50 MG capsule Take by mouth daily.     . Multiple Vitamin (MULTIVITAMIN WITH MINERALS) TABS tablet Take 1 tablet by mouth daily.    . niacin (GNP NIACIN) 250 MG tablet Take 500 mg by mouth daily with breakfast.     . Omega-3 Fatty Acids (FISH OIL) 1200 MG CAPS Take 2,400 capsules by mouth daily.    Marland Kitchen omeprazole (PRILOSEC) 40 MG capsule 1 po bid 180 capsule 3  . OVER THE COUNTER MEDICATION Take 2 tablets by mouth daily. TUMERIC    . polyethylene glycol (MIRALAX / GLYCOLAX) packet Take 17 g by mouth 2  (two) times daily. 14 each 0  . rOPINIRole (REQUIP) 0.5 MG tablet Take 1 tablet (0.5 mg total) by mouth 2 (two) times daily. 180 tablet 1  . traMADol (ULTRAM) 50 MG tablet TAKE 2 TABLETS BY MOUTH THREE TIMES DAILY AS NEEDED FOR CHRONIC PAIN    . valACYclovir (VALTREX) 1000 MG tablet Take 1 tablet (1,000 mg total) by mouth 2 (two) times daily. 180 tablet 1  . VENTOLIN HFA 108 (90 Base) MCG/ACT inhaler INHALE 2 PUFFS INTO LUNGS EVERY 6 HOURS AS NEEDED FOR WHEEZING OR SHORTNESS OF BREATH 18 g 0  . zolpidem (AMBIEN) 5 MG tablet Take 1 tablet (5 mg total) by mouth at bedtime as needed. for sleep 30 tablet 0  . metroNIDAZOLE (FLAGYL) 500 MG tablet Take 1 tablet (500 mg total) by mouth 2 (two) times daily. (Patient  not taking: Reported on 09/30/2018) 14 tablet 0   No current facility-administered medications on file prior to visit.    She is allergic to cefuroxime axetil; oxycodone; simvastatin; sulfa antibiotics; adhesive [tape]; ciprofloxacin; erythromycin; metaxalone; and sulfonamide derivatives..  Review of Systems Review of Systems  Constitutional: Negative for activity change, appetite change and fatigue.  HENT: Negative for hearing loss, congestion, tinnitus and ear discharge.  dentist q21m Eyes: Negative for visual disturbance (see optho q1y -- vision corrected to 20/20 with glasses).  Respiratory: Negative for cough, chest tightness and shortness of breath.   Cardiovascular: Negative for chest pain, palpitations and leg swelling.  Gastrointestinal: Negative for abdominal pain, diarrhea, constipation and abdominal distention.  Genitourinary: Negative for urgency, frequency, decreased urine volume and difficulty urinating.  Musculoskeletal: Negative for back pain, arthralgias and gait problem.  Skin: Negative for color change, pallor and rash.  Neurological: Negative for dizziness, light-headedness, numbness and headaches.  Hematological: Negative for adenopathy. Does not bruise/bleed easily.   Psychiatric/Behavioral: Negative for suicidal ideas, confusion, sleep disturbance, self-injury, dysphoric mood, decreased concentration and agitation.      Objective:    BP 124/60 (BP Location: Right Arm, Patient Position: Sitting, Cuff Size: Normal)   Pulse 85   Temp 98 F (36.7 C) (Temporal)   Resp 18   Ht 4\' 7"  (1.397 m)   Wt 158 lb 12.8 oz (72 kg)   SpO2 93%   BMI 36.91 kg/m  General appearance: alert, cooperative, appears stated age and no distress Head: Normocephalic, without obvious abnormality, atraumatic Eyes: negative findings: lids and lashes normal, conjunctivae and sclerae normal, corneas clear and pupils equal, round, reactive to light and accomodation Ears: normal TM's and external ear canals both ears Nose: Nares normal. Septum midline. Mucosa normal. No drainage or sinus tenderness. Throat: lips, mucosa, and tongue normal; teeth and gums normal Neck: no adenopathy, no carotid bruit, no JVD, supple, symmetrical, trachea midline and thyroid not enlarged, symmetric, no tenderness/mass/nodules Back: symmetric, no curvature. ROM normal. No CVA tenderness. Lungs: clear to auscultation bilaterally Breasts: normal appearance, no masses or tenderness Heart: regular rate and rhythm, S1, S2 normal, no murmur, click, rub or gallop Abdomen: soft, non-tender; bowel sounds normal; no masses,  no organomegaly Pelvic: not indicated; status post hysterectomy, negative ROS-- vaginal exam-- + white d/c , no odor -wet prep done Extremities: extremities normal, atraumatic, no cyanosis or edema Pulses: 2+ and symmetric Skin: Skin color, texture, turgor normal. No rashes or lesions Lymph nodes: Cervical, supraclavicular, and axillary nodes normal. Neurologic: Alert and oriented X 3, normal strength and tone. Normal symmetric reflexes. Normal coordination and gait    Assessment:    Healthy female exam.      Plan:    ghm utd Check labs See After Visit Summary for Counseling  Recommendations    1. Preventative health care See above - TSH - Lipid panel - CBC with Differential/Platelet - Comprehensive metabolic panel - Lipid panel - TSH - CBC with Differential/Platelet - Comprehensive metabolic panel  2. Hyperlipidemia associated with type 2 diabetes mellitus ([place]) Encouraged heart healthy diet, increase exercise, avoid trans fats, consider a krill oil cap daily - Lipid panel - Comprehensive metabolic panel - Lipid panel - Comprehensive metabolic panel  3. Hashimoto's disease Check labs  - TSH - TSH  4. Ventral hernia without obstruction or gangrene GI dx this and pt is requesting to see a surgeon about it ----  GI told her they would not fix it  - Ambulatory referral to  General Surgery  5. Need for influenza vaccination   - Flu Vaccine QUAD 36+ mos IM  6. Need for pneumococcal vaccination  - Pneumococcal polysaccharide vaccine 23-valent greater than or equal to 2yo subcutaneous/IM  7. Encounter for TB tine test  - QuantiFERON-TB Gold Plus  8. Acute vaginitis Recurrent Refer to gyn Wet prep done   9. IBS (irritable bowel syndrome) Pt requesting referral to different GI  - hyoscyamine (LEVSIN SL) 0.125 MG SL tablet; Place 1 tablet (0.125 mg total) under the tongue every 4 (four) hours as needed.  Dispense: 90 tablet; Refill: 1  10. Gastroesophageal reflux disease, esophagitis presence not specified  - Ambulatory referral to Gastroenterology  11. Recurrent vaginitis  - Ambulatory referral to Obstetrics / Gynecology

## 2018-09-30 NOTE — Patient Instructions (Signed)
Preventive Care 68 Years and Older, Female Preventive care refers to lifestyle choices and visits with your health care provider that can promote health and wellness. This includes:  A yearly physical exam. This is also called an annual well check.  Regular dental and eye exams.  Immunizations.  Screening for certain conditions.  Healthy lifestyle choices, such as diet and exercise. What can I expect for my preventive care visit? Physical exam Your health care provider will check:  Height and weight. These may be used to calculate body mass index (BMI), which is a measurement that tells if you are at a healthy weight.  Heart rate and blood pressure.  Your skin for abnormal spots. Counseling Your health care provider may ask you questions about:  Alcohol, tobacco, and drug use.  Emotional well-being.  Home and relationship well-being.  Sexual activity.  Eating habits.  History of falls.  Memory and ability to understand (cognition).  Work and work Statistician.  Pregnancy and menstrual history. What immunizations do I need?  Influenza (flu) vaccine  This is recommended every year. Tetanus, diphtheria, and pertussis (Tdap) vaccine  You may need a Td booster every 10 years. Varicella (chickenpox) vaccine  You may need this vaccine if you have not already been vaccinated. Zoster (shingles) vaccine  You may need this after age 68. Pneumococcal conjugate (PCV13) vaccine  One dose is recommended after age 68. Pneumococcal polysaccharide (PPSV23) vaccine  One dose is recommended after age 72. Measles, mumps, and rubella (MMR) vaccine  You may need at least one dose of MMR if you were born in 1957 or later. You may also need a second dose. Meningococcal conjugate (MenACWY) vaccine  You may need this if you have certain conditions. Hepatitis A vaccine  You may need this if you have certain conditions or if you travel or work in places where you may be exposed  to hepatitis A. Hepatitis B vaccine  You may need this if you have certain conditions or if you travel or work in places where you may be exposed to hepatitis B. Haemophilus influenzae type b (Hib) vaccine  You may need this if you have certain conditions. You may receive vaccines as individual doses or as more than one vaccine together in one shot (combination vaccines). Talk with your health care provider about the risks and benefits of combination vaccines. What tests do I need? Blood tests  Lipid and cholesterol levels. These may be checked every 5 years, or more frequently depending on your overall health.  Hepatitis C test.  Hepatitis B test. Screening  Lung cancer screening. You may have this screening every year starting at age 68 if you have a 30-pack-year history of smoking and currently smoke or have quit within the past 15 years.  Colorectal cancer screening. All adults should have this screening starting at age 68 and continuing until age 15. Your health care provider may recommend screening at age 23 if you are at increased risk. You will have tests every 1-10 years, depending on your results and the type of screening test.  Diabetes screening. This is done by checking your blood sugar (glucose) after you have not eaten for a while (fasting). You may have this done every 1-3 years.  Mammogram. This may be done every 1-2 years. Talk with your health care provider about how often you should have regular mammograms.  BRCA-related cancer screening. This may be done if you have a family history of breast, ovarian, tubal, or peritoneal cancers.  Other tests  Sexually transmitted disease (STD) testing.  Bone density scan. This is done to screen for osteoporosis. You may have this done starting at age 68. Follow these instructions at home: Eating and drinking  Eat a diet that includes fresh fruits and vegetables, whole grains, lean protein, and low-fat dairy products. Limit  your intake of foods with high amounts of sugar, saturated fats, and salt.  Take vitamin and mineral supplements as recommended by your health care provider.  Do not drink alcohol if your health care provider tells you not to drink.  If you drink alcohol: ? Limit how much you have to 0-1 drink a day. ? Be aware of how much alcohol is in your drink. In the [place] one drink equals one 12 oz bottle of beer (355 mL), one 5 oz glass of wine (148 mL), or one 1 oz glass of hard liquor (44 mL). Lifestyle  Take daily care of your teeth and gums.  Stay active. Exercise for at least 30 minutes on 5 or more days each week.  Do not use any products that contain nicotine or tobacco, such as cigarettes, e-cigarettes, and chewing tobacco. If you need help quitting, ask your health care provider.  If you are sexually active, practice safe sex. Use a condom or other form of protection in order to prevent STIs (sexually transmitted infections).  Talk with your health care provider about taking a low-dose aspirin or statin. What's next?  Go to your health care provider once a year for a well check visit.  Ask your health care provider how often you should have your eyes and teeth checked.  Stay up to date on all vaccines. This information is not intended to replace advice given to you by your health care provider. Make sure you discuss any questions you have with your health care provider. Document Released: 01/25/2015 Document Revised: 12/23/2017 Document Reviewed: 12/23/2017 Elsevier Patient Education  2020 [employer].

## 2018-10-01 LAB — CBC WITH DIFFERENTIAL/PLATELET
Absolute Monocytes: 739 cells/uL (ref 200–950)
Basophils Absolute: 62 cells/uL (ref 0–200)
Basophils Relative: 0.7 %
Eosinophils Absolute: 458 cells/uL (ref 15–500)
Eosinophils Relative: 5.2 %
HCT: 42.1 % (ref 35.0–45.0)
Hemoglobin: 13.9 g/dL (ref 11.7–15.5)
Lymphs Abs: 2517 cells/uL (ref 850–3900)
MCH: 28.7 pg (ref 27.0–33.0)
MCHC: 33 g/dL (ref 32.0–36.0)
MCV: 87 fL (ref 80.0–100.0)
MPV: 10.8 fL (ref 7.5–12.5)
Monocytes Relative: 8.4 %
Neutro Abs: 5025 cells/uL (ref 1500–7800)
Neutrophils Relative %: 57.1 %
Platelets: 397 10*3/uL (ref 140–400)
RBC: 4.84 10*6/uL (ref 3.80–5.10)
RDW: 12.7 % (ref 11.0–15.0)
Total Lymphocyte: 28.6 %
WBC: 8.8 10*3/uL (ref 3.8–10.8)

## 2018-10-01 LAB — LIPID PANEL
Cholesterol: 204 mg/dL — ABNORMAL HIGH (ref <200)
HDL: 48 mg/dL — ABNORMAL LOW (ref >=50)
LDL Cholesterol (Calc): 128 mg/dL (calc) — ABNORMAL HIGH
Non-HDL Cholesterol (Calc): 156 mg/dL (calc) — ABNORMAL HIGH (ref <130)
Total CHOL/HDL Ratio: 4.3 (calc) (ref <5.0)
Triglycerides: 161 mg/dL — ABNORMAL HIGH (ref <150)

## 2018-10-01 LAB — COMPREHENSIVE METABOLIC PANEL
AG Ratio: 1.8 (calc) (ref 1.0–2.5)
ALT: 22 U/L (ref 6–29)
AST: 24 U/L (ref 10–35)
Albumin: 4.4 g/dL (ref 3.6–5.1)
Alkaline phosphatase (APISO): 68 U/L (ref 37–153)
BUN: 15 mg/dL (ref 7–25)
CO2: 22 mmol/L (ref 20–32)
Calcium: 9.6 mg/dL (ref 8.6–10.4)
Chloride: 103 mmol/L (ref 98–110)
Creat: 0.83 mg/dL (ref 0.50–0.99)
Globulin: 2.4 g/dL (calc) (ref 1.9–3.7)
Glucose, Bld: 109 mg/dL — ABNORMAL HIGH (ref 65–99)
Potassium: 3.9 mmol/L (ref 3.5–5.3)
Sodium: 138 mmol/L (ref 135–146)
Total Bilirubin: 0.4 mg/dL (ref 0.2–1.2)
Total Protein: 6.8 g/dL (ref 6.1–8.1)

## 2018-10-01 LAB — TSH: TSH: 2.11 mIU/L (ref 0.40–4.50)

## 2018-10-02 ENCOUNTER — Other Ambulatory Visit: Payer: Self-pay | Admitting: Family Medicine

## 2018-10-02 LAB — QUANTIFERON-TB GOLD PLUS
Mitogen-NIL: >10 IU/mL
NIL: 0.05 IU/mL
QuantiFERON-TB Gold Plus: NEGATIVE
TB1-NIL: <0 IU/mL
TB2-NIL: <0 IU/mL

## 2018-10-03 LAB — CERVICOVAGINAL ANCILLARY ONLY
Bacterial Vaginitis (gardnerella): POSITIVE — AB
Candida Glabrata: NEGATIVE
Candida Vaginitis: NEGATIVE
Molecular Disclaimer: NEGATIVE
Molecular Disclaimer: NEGATIVE
Molecular Disclaimer: NEGATIVE
Molecular Disclaimer: NORMAL
Trichomonas: NEGATIVE

## 2018-10-03 MED ORDER — EZETIMIBE 10 MG PO TABS: 10.0000 mg | ORAL_TABLET | Freq: Every day | ORAL | 2 refills | Status: DC

## 2018-10-04 ENCOUNTER — Other Ambulatory Visit: Payer: Self-pay | Admitting: Family Medicine

## 2018-10-04 ENCOUNTER — Other Ambulatory Visit: Payer: Self-pay

## 2018-10-04 DIAGNOSIS — N760 Acute vaginitis: Secondary | ICD-10-CM

## 2018-10-04 DIAGNOSIS — B9689 Other specified bacterial agents as the cause of diseases classified elsewhere: Secondary | ICD-10-CM

## 2018-10-04 LAB — CERVICOVAGINAL ANCILLARY ONLY
Chlamydia: NEGATIVE
Neisseria Gonorrhea: NEGATIVE

## 2018-10-04 MED ORDER — METRONIDAZOLE 500 MG PO TABS: 500.0000 mg | ORAL_TABLET | Freq: Two times a day (BID) | ORAL | 0 refills | Status: DC

## 2018-10-05 ENCOUNTER — Encounter: Payer: Self-pay | Admitting: Family Medicine

## 2018-10-05 NOTE — Telephone Encounter (Signed)
If she is having recurrent BV she needs to see GYN

## 2018-10-07 ENCOUNTER — Encounter: Payer: Self-pay | Admitting: Family Medicine

## 2018-10-11 DIAGNOSIS — H5203 Hypermetropia, bilateral: Secondary | ICD-10-CM | POA: Diagnosis not present

## 2018-10-16 ENCOUNTER — Encounter: Payer: Self-pay | Admitting: Family Medicine

## 2018-10-16 DIAGNOSIS — E1169 Type 2 diabetes mellitus with other specified complication: Secondary | ICD-10-CM

## 2018-10-17 NOTE — Telephone Encounter (Signed)
Lets refer her to lipid clinic

## 2018-10-18 ENCOUNTER — Encounter: Payer: Self-pay | Admitting: Family Medicine

## 2018-10-18 DIAGNOSIS — G4700 Insomnia, unspecified: Secondary | ICD-10-CM

## 2018-10-18 NOTE — Telephone Encounter (Signed)
Requesting: Ambien Contract: 06/19/2016 UDS: 03/09/2017, low risk Last OV: 09/30/2018 Next OV: N/A Last Refill: 09/20/2018, #30--0 RF Database:   Please advise

## 2018-10-19 MED ORDER — ZOLPIDEM TARTRATE 5 MG PO TABS: 5.0000 mg | ORAL_TABLET | Freq: Every evening | ORAL | 0 refills | Status: DC | PRN

## 2018-10-20 ENCOUNTER — Encounter: Payer: Self-pay | Admitting: Family Medicine

## 2018-10-20 NOTE — Telephone Encounter (Signed)
The 10-year ASCVD risk score Mikey Bussing DC Brooke Bonito., et al., 2013) is: 18.6%   Values used to calculate the score:     Age: 68 years     Sex: Female     Is Non-Hispanic African American: No     Diabetic: Yes     Tobacco smoker: No     Systolic Blood Pressure: [id number] mmHg     Is BP treated: Yes     HDL Cholesterol: 48 mg/dL     Total Cholesterol: 204 mg/dL   Her risk of heart attack ant stoke is 18.8 %    Anything over 7.5% is high risk.  Its her decision !

## 2018-10-24 ENCOUNTER — Encounter: Payer: Self-pay | Admitting: *Deleted

## 2018-10-24 ENCOUNTER — Ambulatory Visit: Payer: Medicare HMO | Admitting: Cardiology

## 2018-10-24 ENCOUNTER — Ambulatory Visit: Payer: Medicare HMO | Admitting: Surgery

## 2018-10-24 ENCOUNTER — Telehealth: Payer: Self-pay | Admitting: *Deleted

## 2018-10-24 NOTE — Telephone Encounter (Signed)
FYI:  It looks like there are forms attached in a patient email from 09/30/18 that may be what she is referring to.  Copied from [place] [phone]. Topic: General - Inquiry >> Oct 24, 2018 10:46 AM [name] wrote: Reason for CRM: pt had cpe on 9/18 and left a form for dr to fill out.  Left at check out desk. Form never filled out and she needs TODAY. She will have them to refax and please send back today.she has a new joband they need.

## 2018-10-26 ENCOUNTER — Encounter: Payer: Self-pay | Admitting: Family Medicine

## 2018-10-26 ENCOUNTER — Telehealth: Payer: Self-pay | Admitting: Internal Medicine

## 2018-10-26 NOTE — Telephone Encounter (Signed)
LVM for patient to call and schedule a new patient appt with Dr. Debara Pickett for lipids.

## 2018-10-28 ENCOUNTER — Encounter: Payer: Self-pay | Admitting: *Deleted

## 2018-10-28 ENCOUNTER — Telehealth: Payer: Self-pay

## 2018-10-28 NOTE — Telephone Encounter (Signed)
Paperwork was faxed on Thursday.

## 2018-10-28 NOTE — Telephone Encounter (Signed)
mychart message sent to patient that letter sent to mychart.

## 2018-10-28 NOTE — Telephone Encounter (Signed)
Copied from [place] [phone]. Topic: General - Inquiry >> Oct 26, 2018  1:32 PM [name] wrote: Patient is checking status of paper work from [employer] for her new job.  Patient needs forms faxed over [place] Attention: [facility]   Call back [phone]

## 2018-10-28 NOTE — Telephone Encounter (Signed)
Pt states the test for her QuantiFERON-TB Gold result she  pulled from mychart does not have her name attached, she is requesting you send her employer the results that has her name attached.

## 2018-11-02 ENCOUNTER — Telehealth: Payer: Self-pay | Admitting: *Deleted

## 2018-11-02 NOTE — Telephone Encounter (Signed)
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

## 2018-11-20 DIAGNOSIS — M25561 Pain in right knee: Secondary | ICD-10-CM | POA: Diagnosis not present

## 2018-11-20 DIAGNOSIS — Z9181 History of falling: Secondary | ICD-10-CM | POA: Diagnosis not present

## 2018-11-20 DIAGNOSIS — G8929 Other chronic pain: Secondary | ICD-10-CM | POA: Diagnosis not present

## 2018-11-20 DIAGNOSIS — S8991XA Unspecified injury of right lower leg, initial encounter: Secondary | ICD-10-CM | POA: Diagnosis not present

## 2018-11-20 DIAGNOSIS — K05219 Aggressive periodontitis, localized, unspecified severity: Secondary | ICD-10-CM | POA: Diagnosis not present

## 2018-11-21 ENCOUNTER — Encounter: Payer: Self-pay | Admitting: Family Medicine

## 2018-11-21 DIAGNOSIS — G4700 Insomnia, unspecified: Secondary | ICD-10-CM

## 2018-11-22 ENCOUNTER — Encounter: Payer: Self-pay | Admitting: Family Medicine

## 2018-11-22 DIAGNOSIS — E785 Hyperlipidemia, unspecified: Secondary | ICD-10-CM

## 2018-11-22 MED ORDER — ZOLPIDEM TARTRATE 5 MG PO TABS: 5.0000 mg | ORAL_TABLET | Freq: Every evening | ORAL | 0 refills | Status: DC | PRN

## 2018-11-22 NOTE — Telephone Encounter (Signed)
Requesting: Ambien Contract: 03/09/2017 UDS: 03/09/2017, low risk Last OV: 09/30/2018 Next OV: N/A Last Refill: 10/19/2018, #30--0 RF Database:   Please advise

## 2018-11-23 MED ORDER — FENOFIBRATE 160 MG PO TABS: 160.0000 mg | ORAL_TABLET | Freq: Every day | ORAL | 0 refills | Status: DC

## 2018-11-29 DIAGNOSIS — M545 Low back pain: Secondary | ICD-10-CM | POA: Diagnosis not present

## 2018-11-29 DIAGNOSIS — Z79891 Long term (current) use of opiate analgesic: Secondary | ICD-10-CM | POA: Diagnosis not present

## 2018-11-29 DIAGNOSIS — M542 Cervicalgia: Secondary | ICD-10-CM | POA: Diagnosis not present

## 2018-11-29 DIAGNOSIS — Z79899 Other long term (current) drug therapy: Secondary | ICD-10-CM | POA: Diagnosis not present

## 2018-11-29 DIAGNOSIS — G894 Chronic pain syndrome: Secondary | ICD-10-CM | POA: Diagnosis not present

## 2018-11-29 DIAGNOSIS — M5136 Other intervertebral disc degeneration, lumbar region: Secondary | ICD-10-CM | POA: Diagnosis not present

## 2018-11-29 DIAGNOSIS — Z5181 Encounter for therapeutic drug level monitoring: Secondary | ICD-10-CM | POA: Diagnosis not present

## 2018-12-13 ENCOUNTER — Encounter: Payer: Self-pay | Admitting: Family Medicine

## 2018-12-13 MED ORDER — ROPINIROLE HCL 0.5 MG PO TABS: 0.5000 mg | ORAL_TABLET | Freq: Two times a day (BID) | ORAL | 1 refills | Status: DC

## 2018-12-20 DIAGNOSIS — Z89422 Acquired absence of other left toe(s): Secondary | ICD-10-CM | POA: Diagnosis not present

## 2018-12-20 DIAGNOSIS — M216X2 Other acquired deformities of left foot: Secondary | ICD-10-CM | POA: Diagnosis not present

## 2018-12-20 DIAGNOSIS — M216X1 Other acquired deformities of right foot: Secondary | ICD-10-CM | POA: Diagnosis not present

## 2018-12-20 DIAGNOSIS — M722 Plantar fascial fibromatosis: Secondary | ICD-10-CM | POA: Diagnosis not present

## 2018-12-21 ENCOUNTER — Encounter: Payer: Self-pay | Admitting: Family Medicine

## 2018-12-21 DIAGNOSIS — G4700 Insomnia, unspecified: Secondary | ICD-10-CM

## 2018-12-21 DIAGNOSIS — K219 Gastro-esophageal reflux disease without esophagitis: Secondary | ICD-10-CM

## 2018-12-22 MED ORDER — OMEPRAZOLE 40 MG PO CPDR: DELAYED_RELEASE_CAPSULE | ORAL | 1 refills | Status: DC

## 2018-12-22 MED ORDER — ZOLPIDEM TARTRATE 5 MG PO TABS: 5.0000 mg | ORAL_TABLET | Freq: Every evening | ORAL | 0 refills | Status: DC | PRN

## 2018-12-22 NOTE — Telephone Encounter (Signed)
Requesting: Ambien Contract: 03/09/2017 UDS: 03/09/2017, low risk Last OV: 09/30/2018 Next OV: N/A Last Refill: 11/22/2018, #30--0 RF Database:   Please advise

## 2018-12-28 ENCOUNTER — Encounter: Payer: Self-pay | Admitting: Family Medicine

## 2018-12-28 DIAGNOSIS — F411 Generalized anxiety disorder: Secondary | ICD-10-CM

## 2018-12-28 MED ORDER — ESCITALOPRAM OXALATE 20 MG PO TABS: 20.0000 mg | ORAL_TABLET | Freq: Every day | ORAL | 1 refills | Status: DC

## 2018-12-28 NOTE — Addendum Note (Signed)
Addended by: Kem Boroughs D on: 12/28/2018 02:42 PM   Modules accepted: Orders

## 2019-01-09 ENCOUNTER — Encounter: Payer: Self-pay | Admitting: Family Medicine

## 2019-01-10 MED ORDER — ALENDRONATE SODIUM 70 MG PO TABS: ORAL_TABLET | ORAL | 3 refills | Status: DC

## 2019-01-25 ENCOUNTER — Encounter: Payer: Self-pay | Admitting: *Deleted

## 2019-01-27 ENCOUNTER — Other Ambulatory Visit: Payer: Self-pay | Admitting: Family Medicine

## 2019-01-27 DIAGNOSIS — N898 Other specified noninflammatory disorders of vagina: Secondary | ICD-10-CM

## 2019-01-27 DIAGNOSIS — G4700 Insomnia, unspecified: Secondary | ICD-10-CM

## 2019-01-27 MED ORDER — PREMARIN 0.625 MG/GM VA CREA: TOPICAL_CREAM | VAGINAL | 0 refills | Status: DC

## 2019-01-27 MED ORDER — ZOLPIDEM TARTRATE 5 MG PO TABS: 5.0000 mg | ORAL_TABLET | Freq: Every evening | ORAL | 0 refills | Status: DC | PRN

## 2019-01-27 NOTE — Telephone Encounter (Signed)
Requesting:Ambien  Contract:03/09/17 UDS:03/09/17 Last OV:09/30/18 Next OV:n/a Last Refill:12/22/18 #30 tab Database:   Please advise

## 2019-02-16 ENCOUNTER — Encounter: Payer: Self-pay | Admitting: Family Medicine

## 2019-02-16 ENCOUNTER — Other Ambulatory Visit: Payer: Self-pay

## 2019-02-16 ENCOUNTER — Ambulatory Visit (INDEPENDENT_AMBULATORY_CARE_PROVIDER_SITE_OTHER): Payer: Medicare HMO | Admitting: Family Medicine

## 2019-02-16 VITALS — HR 80 | Ht <= 58 in

## 2019-02-16 DIAGNOSIS — D729 Disorder of white blood cells, unspecified: Secondary | ICD-10-CM | POA: Diagnosis not present

## 2019-02-16 DIAGNOSIS — J302 Other seasonal allergic rhinitis: Secondary | ICD-10-CM | POA: Diagnosis not present

## 2019-02-16 DIAGNOSIS — Z20822 Contact with and (suspected) exposure to covid-19: Secondary | ICD-10-CM

## 2019-02-16 DIAGNOSIS — N760 Acute vaginitis: Secondary | ICD-10-CM | POA: Diagnosis not present

## 2019-02-16 DIAGNOSIS — E785 Hyperlipidemia, unspecified: Secondary | ICD-10-CM

## 2019-02-16 MED ORDER — FLUTICASONE PROPIONATE 50 MCG/ACT NA SUSP: 2.0000 | Freq: Every day | NASAL | 3 refills | Status: DC

## 2019-02-16 NOTE — Progress Notes (Signed)
Virtual Visit via Video Note  I connected with [name] on 02/16/19 at  4:00 PM EST by a video enabled telemedicine application and verified that I am speaking with the correct person using two identifiers.  Location: Patient: home alone  Provider: office    I discussed the limitations of evaluation and management by telemedicine and the availability of in person appointments. The patient expressed understanding and agreed to proceed.  History of Present Illness: Pt is home and is getting ready to move to [facility] for the winter .  She needs some refills and needs labs .    Pt was exposed to covid 2-3 days ago === she has no symptoms  She was wearing a mask     Observations/Objective: Vitals:   02/16/19 1558  Pulse: 80   Pt is in nad  Assessment and Plan: 1. Seasonal allergic rhinitis, unspecified trigger Stable con't flonase and antihistamine  - fluticasone (FLONASE) 50 MCG/ACT nasal spray; Place 2 sprays into both nostrils daily.  Dispense: 16 g; Refill: 3  2. Hyperlipidemia, unspecified hyperlipidemia type Encouraged heart healthy diet, increase exercise, avoid trans fats, consider a krill oil cap daily - Lipid panel; Future - Comprehensive metabolic panel; Future  3. Abnormal WBC count . Lab Results  Component Value Date   WBC 8.8 09/30/2018    - CBC with Differential/Platelet; Future  4. Recurrent vaginitis Pt was out of town when referral was down---- needs new app when she gets back from [place]  - Urine cytology ancillary only([place]); Future  5. Close exposure to COVID-19 virus  She was wearing a mask and has no symptoms  She will dt tested next week - MyChart COVID-19 home monitoring program; Future - Temperature monitoring; Future   Follow Up Instructions:    I discussed the assessment and treatment plan with the patient. The patient was provided an opportunity to ask questions and all were answered. The patient agreed with the plan and  demonstrated an understanding of the instructions.   The patient was advised to call back or seek an in-person evaluation if the symptoms worsen or if the condition fails to improve as anticipated.  I provided 30 minutes of non-face-to-face time during this encounter.   [name], DO

## 2019-02-20 NOTE — Telephone Encounter (Signed)
Why did they refuse to give it to her

## 2019-02-22 ENCOUNTER — Encounter: Payer: Self-pay | Admitting: Family Medicine

## 2019-02-22 DIAGNOSIS — Z20828 Contact with and (suspected) exposure to other viral communicable diseases: Secondary | ICD-10-CM | POA: Diagnosis not present

## 2019-02-22 NOTE — Telephone Encounter (Signed)
Lab appointment made for tomorrow

## 2019-02-23 ENCOUNTER — Encounter: Payer: Self-pay | Admitting: Family Medicine

## 2019-02-23 ENCOUNTER — Other Ambulatory Visit: Payer: Medicare HMO

## 2019-02-24 ENCOUNTER — Other Ambulatory Visit (INDEPENDENT_AMBULATORY_CARE_PROVIDER_SITE_OTHER): Payer: Medicare HMO

## 2019-02-24 ENCOUNTER — Telehealth: Payer: Self-pay | Admitting: Family Medicine

## 2019-02-24 ENCOUNTER — Encounter: Payer: Self-pay | Admitting: Family Medicine

## 2019-02-24 ENCOUNTER — Other Ambulatory Visit (HOSPITAL_COMMUNITY)
Admission: RE | Admit: 2019-02-24 | Discharge: 2019-02-24 | Disposition: A | Discharge status: Home / Self Care | Payer: Medicare HMO | Source: Ambulatory Visit | Attending: Family Medicine | Admitting: Family Medicine

## 2019-02-24 ENCOUNTER — Other Ambulatory Visit: Payer: Self-pay

## 2019-02-24 DIAGNOSIS — D729 Disorder of white blood cells, unspecified: Secondary | ICD-10-CM | POA: Diagnosis not present

## 2019-02-24 DIAGNOSIS — N760 Acute vaginitis: Secondary | ICD-10-CM | POA: Insufficient documentation

## 2019-02-24 DIAGNOSIS — E785 Hyperlipidemia, unspecified: Secondary | ICD-10-CM | POA: Diagnosis not present

## 2019-02-24 LAB — CBC WITH DIFFERENTIAL/PLATELET
Basophils Absolute: 0 10*3/uL (ref 0.0–0.1)
Basophils Relative: 0.5 % (ref 0.0–3.0)
Eosinophils Absolute: 0.2 10*3/uL (ref 0.0–0.7)
Eosinophils Relative: 2.5 % (ref 0.0–5.0)
HCT: 40.8 % (ref 36.0–46.0)
Hemoglobin: 13.3 g/dL (ref 12.0–15.0)
Lymphocytes Relative: 18.3 % (ref 12.0–46.0)
Lymphs Abs: 1.8 10*3/uL (ref 0.7–4.0)
MCHC: 32.5 g/dL (ref 30.0–36.0)
MCV: 85.9 fl (ref 78.0–100.0)
Monocytes Absolute: 0.8 10*3/uL (ref 0.1–1.0)
Monocytes Relative: 7.7 % (ref 3.0–12.0)
Neutro Abs: 7 10*3/uL (ref 1.4–7.7)
Neutrophils Relative %: 71 % (ref 43.0–77.0)
Platelets: 380 10*3/uL (ref 150.0–400.0)
RBC: 4.75 Mil/uL (ref 3.87–5.11)
RDW: 12.7 % (ref 11.5–15.5)
WBC: 9.9 10*3/uL (ref 4.0–10.5)

## 2019-02-24 LAB — COMPREHENSIVE METABOLIC PANEL
ALT: 26 U/L (ref 0–35)
AST: 24 U/L (ref 0–37)
Albumin: 4.5 g/dL (ref 3.5–5.2)
Alkaline Phosphatase: 49 U/L (ref 39–117)
BUN: 18 mg/dL (ref 6–23)
CO2: 27 mEq/L (ref 19–32)
Calcium: 9.9 mg/dL (ref 8.4–10.5)
Chloride: 105 mEq/L (ref 96–112)
Creatinine, Ser: 0.8 mg/dL (ref 0.40–1.20)
GFR: 71.1 mL/min (ref >60.00)
Glucose, Bld: 82 mg/dL (ref 70–99)
Potassium: 4.1 mEq/L (ref 3.5–5.1)
Sodium: 140 mEq/L (ref 135–145)
Total Bilirubin: 0.6 mg/dL (ref 0.2–1.2)
Total Protein: 6.9 g/dL (ref 6.0–8.3)

## 2019-02-24 LAB — LIPID PANEL
Cholesterol: 149 mg/dL (ref 0–200)
HDL: 52 mg/dL (ref >39.00)
LDL Cholesterol: 69 mg/dL (ref 0–99)
NonHDL: 97.44
Total CHOL/HDL Ratio: 3
Triglycerides: 141 mg/dL (ref 0.0–149.0)
VLDL: 28.2 mg/dL (ref 0.0–40.0)

## 2019-02-24 NOTE — Telephone Encounter (Signed)
Received. Chart updated

## 2019-02-24 NOTE — Telephone Encounter (Signed)
Pt dropped off copy of Vaccination Record card for Covid-19. Pt wants to have update on her chart. Document put at front office tray under providers name.

## 2019-03-01 ENCOUNTER — Encounter: Payer: Self-pay | Admitting: Family Medicine

## 2019-03-02 ENCOUNTER — Encounter: Payer: Self-pay | Admitting: Family Medicine

## 2019-03-02 LAB — URINE CYTOLOGY ANCILLARY ONLY
Bacterial Vaginitis-Urine: NEGATIVE
Candida Urine: NEGATIVE
Chlamydia: NEGATIVE
Comment: NEGATIVE
Comment: NEGATIVE
Comment: NORMAL
Neisseria Gonorrhea: NEGATIVE
Trichomonas: NEGATIVE

## 2019-03-02 NOTE — Telephone Encounter (Signed)
No results in computer  Will need kristy or tricia to check on results when we are all back in office

## 2019-03-05 ENCOUNTER — Encounter: Payer: Self-pay | Admitting: Family Medicine

## 2019-03-05 DIAGNOSIS — S134XXA Sprain of ligaments of cervical spine, initial encounter: Secondary | ICD-10-CM

## 2019-03-06 NOTE — Telephone Encounter (Signed)
Dr [name] -- Urine results were final on 03/02/19 and available in [place] now.

## 2019-03-06 NOTE — Telephone Encounter (Signed)
Already released to pt

## 2019-03-07 ENCOUNTER — Other Ambulatory Visit: Payer: Self-pay

## 2019-03-07 DIAGNOSIS — G4700 Insomnia, unspecified: Secondary | ICD-10-CM

## 2019-03-07 MED ORDER — ZOLPIDEM TARTRATE 5 MG PO TABS: 5.0000 mg | ORAL_TABLET | Freq: Every evening | ORAL | 0 refills | Status: DC | PRN

## 2019-03-07 MED ORDER — CYCLOBENZAPRINE HCL 10 MG PO TABS: 10.0000 mg | ORAL_TABLET | Freq: Two times a day (BID) | ORAL | 1 refills | Status: DC | PRN

## 2019-03-07 MED ORDER — VALACYCLOVIR HCL 1 G PO TABS: 1000.0000 mg | ORAL_TABLET | Freq: Two times a day (BID) | ORAL | 1 refills | Status: DC

## 2019-03-07 NOTE — Progress Notes (Signed)
Request: Ambien 5mg  Last refill: 01/27/2019 #30, 0 RF Last OV: 02/16/2019 Next OV: None.    Please refill at pharmacy on file in [place].

## 2019-03-08 ENCOUNTER — Other Ambulatory Visit: Payer: Self-pay | Admitting: Family Medicine

## 2019-03-08 ENCOUNTER — Encounter: Payer: Self-pay | Admitting: Family Medicine

## 2019-03-08 DIAGNOSIS — G4700 Insomnia, unspecified: Secondary | ICD-10-CM

## 2019-03-08 MED ORDER — ZOLPIDEM TARTRATE 5 MG PO TABS: 5.0000 mg | ORAL_TABLET | Freq: Every evening | ORAL | 0 refills | Status: DC | PRN

## 2019-03-08 NOTE — Progress Notes (Signed)
done

## 2019-03-14 ENCOUNTER — Telehealth: Payer: Self-pay | Admitting: Family Medicine

## 2019-03-14 NOTE — Telephone Encounter (Signed)
Left message for patient to call office to schedule AWV with [place]. Last AWV 03/15/2018. SF

## 2019-03-25 ENCOUNTER — Other Ambulatory Visit: Payer: Self-pay | Admitting: Family Medicine

## 2019-03-25 DIAGNOSIS — G4700 Insomnia, unspecified: Secondary | ICD-10-CM

## 2019-03-27 MED ORDER — ZOLPIDEM TARTRATE 5 MG PO TABS: 5.0000 mg | ORAL_TABLET | Freq: Every evening | ORAL | 0 refills | Status: DC | PRN

## 2019-03-27 NOTE — Telephone Encounter (Signed)
Requesting:ambien Contract:n/a UDS:n/a Last OV:02/16/19 Next OV:n/a Last Refill: 03/05/19  #30-0rf Database:   Please advise

## 2019-04-17 ENCOUNTER — Telehealth: Payer: Self-pay | Admitting: Family Medicine

## 2019-04-17 NOTE — Chronic Care Management (AMB) (Signed)
  Chronic Care Management   Note  04/17/2019 Name: TARRAH GAIL MRN: [id number] DOB: 06-15-1950  EMAROSA KALT is a 69 y.o. year old female who is a primary care patient of [name], DO. I reached out to Dwain Sarna by phone today in response to a referral sent by Ms. Valda Lamb Selvy's PCP, [name], [name], DO.   Ms. Clayson was given information about Chronic Care Management services today including:  1. CCM service includes personalized support from designated clinical staff supervised by her physician, including individualized plan of care and coordination with other care providers 2. 24/7 contact phone numbers for assistance for urgent and routine care needs. 3. Service will only be billed when office clinical staff spend 20 minutes or more in a month to coordinate care. 4. Only one practitioner may furnish and bill the service in a calendar month. 5. The patient may stop CCM services at any time (effective at the end of the month) by phone call to the office staff.   Patient agreed to services and verbal consent obtained.   Follow up plan:   Raynicia Dukes [employer] Scheduler

## 2019-05-01 ENCOUNTER — Other Ambulatory Visit: Payer: Self-pay | Admitting: Family Medicine

## 2019-05-01 DIAGNOSIS — Z1231 Encounter for screening mammogram for malignant neoplasm of breast: Secondary | ICD-10-CM

## 2019-05-05 ENCOUNTER — Ambulatory Visit: Payer: Medicare HMO

## 2019-05-08 ENCOUNTER — Other Ambulatory Visit: Payer: Self-pay

## 2019-05-08 ENCOUNTER — Encounter: Payer: Self-pay | Admitting: Family Medicine

## 2019-05-08 ENCOUNTER — Telehealth (INDEPENDENT_AMBULATORY_CARE_PROVIDER_SITE_OTHER): Payer: Medicare HMO | Admitting: Family Medicine

## 2019-05-08 DIAGNOSIS — F418 Other specified anxiety disorders: Secondary | ICD-10-CM | POA: Diagnosis not present

## 2019-05-08 DIAGNOSIS — N760 Acute vaginitis: Secondary | ICD-10-CM | POA: Diagnosis not present

## 2019-05-08 DIAGNOSIS — J302 Other seasonal allergic rhinitis: Secondary | ICD-10-CM | POA: Diagnosis not present

## 2019-05-08 DIAGNOSIS — B9689 Other specified bacterial agents as the cause of diseases classified elsewhere: Secondary | ICD-10-CM

## 2019-05-08 DIAGNOSIS — K589 Irritable bowel syndrome without diarrhea: Secondary | ICD-10-CM

## 2019-05-08 DIAGNOSIS — R41840 Attention and concentration deficit: Secondary | ICD-10-CM | POA: Diagnosis not present

## 2019-05-08 MED ORDER — HYOSCYAMINE SULFATE 0.125 MG SL SUBL: 0.1250 mg | SUBLINGUAL_TABLET | SUBLINGUAL | 1 refills | Status: DC | PRN

## 2019-05-08 MED ORDER — FLUTICASONE PROPIONATE 50 MCG/ACT NA SUSP: 2.0000 | Freq: Every day | NASAL | 3 refills | Status: DC

## 2019-05-08 MED ORDER — ARIPIPRAZOLE 2 MG PO TABS: 2.0000 mg | ORAL_TABLET | Freq: Every day | ORAL | 2 refills | Status: DC

## 2019-05-08 NOTE — Progress Notes (Signed)
.  Virtual Visit via Video Note  I connected with [name] on 05/08/19 at  3:40 PM EDT by a video enabled telemedicine application and verified that I am speaking with the correct person using two identifiers.  Location: Patient: home alone  Provider: office    I discussed the limitations of evaluation and management by telemedicine and the availability of in person appointments. The patient expressed understanding and agreed to proceed.  History of Present Illness: Pt is trouble with focusing and getting used to jobs and has been fired 2 times  Due to lack of focus.  This is causing inc in panic attacks and depression She is not suicidal  Pt also wants referral to gyn for recurrent vaginitis     Observations/Objective: There were no vitals filed for this visit. Pt is in nad    Assessment and Plan: 1. Bacterial vaginitis Pt with recurrent issue and is requesting a referral  - Ambulatory referral to Obstetrics / Gynecology  2. Poor concentration Refer to psych for testing  - Ambulatory referral to Psychology  3. Depression with anxiety Restart ability---- she had to stop it in the past due to cost - ARIPiprazole (ABILIFY) 2 MG tablet; Take 1 tablet (2 mg total) by mouth daily.  Dispense: 30 tablet; Refill: 2   Follow Up Instructions:    I discussed the assessment and treatment plan with the patient. The patient was provided an opportunity to ask questions and all were answered. The patient agreed with the plan and demonstrated an understanding of the instructions.   The patient was advised to call back or seek an in-person evaluation if the symptoms worsen or if the condition fails to improve as anticipated.  I provided 30 minutes of non-face-to-face time during this encounter.   [name], DO

## 2019-05-09 ENCOUNTER — Ambulatory Visit: Payer: Medicare HMO | Admitting: Pharmacist

## 2019-05-09 ENCOUNTER — Other Ambulatory Visit: Payer: Self-pay

## 2019-05-09 ENCOUNTER — Other Ambulatory Visit: Payer: Self-pay | Admitting: *Deleted

## 2019-05-09 DIAGNOSIS — E785 Hyperlipidemia, unspecified: Secondary | ICD-10-CM

## 2019-05-09 DIAGNOSIS — F411 Generalized anxiety disorder: Secondary | ICD-10-CM

## 2019-05-09 DIAGNOSIS — F321 Major depressive disorder, single episode, moderate: Secondary | ICD-10-CM

## 2019-05-09 MED ORDER — ESCITALOPRAM OXALATE 20 MG PO TABS: 20.0000 mg | ORAL_TABLET | Freq: Every day | ORAL | 1 refills | Status: DC

## 2019-05-09 NOTE — Chronic Care Management (AMB) (Signed)
Chronic Care Management Pharmacy  Name: [name]  MRN: [id number] DOB: 05-28-1950  Chief Complaint/ HPI  [name],  69 y.o. , female presents for their Initial CCM visit with the clinical pharmacist via telephone due to COVID-19 Pandemic.  PCP : [name], DO  Their chronic conditions include: Asthma, Pre-Diabetes, Hypertension, Hyperlipidemia, RLS/Neuropathy, Pain, Insomnia, Depression, GERD, Osteopenia, Allergic Rhrinitis   Office Visits: 05/08/19: Visit w/ [name] - Depression/Anxiety - restart Abilify. Poor concentration - referral to psych for testing. Bacterial vaginitis - referall to ob/gyn  02/16/19: Visit w/ [name] - Seasonal allergies - flonase. Labs ordered lipid and cmp.  Consult Visit: 12/20/18: Podiatry visit w/ Dr. Ricki Miller  11/29/18: Chiropractic visit w/ Dr. Bayard Males  Medications: Outpatient Encounter Medications as of 05/09/2019  Medication Sig Note  . alendronate (FOSAMAX) 70 MG tablet TAKE 1 TABLET EVERY WEEK. TAKE WITH FULL GLASS OF WATER ON AN EMPTY STOMACH   . ARIPiprazole (ABILIFY) 2 MG tablet Take 1 tablet (2 mg total) by mouth daily.   . Calcium-Magnesium-Vitamin D [phone] MG-MG-UNIT TABS Take 1 tablet by mouth daily.   Marland Kitchen conjugated estrogens (PREMARIN) vaginal cream 0.5 g  2-3 days a week   . cyclobenzaprine (FLEXERIL) 10 MG tablet Take 1 tablet (10 mg total) by mouth 2 (two) times daily as needed for muscle spasms.   Marland Kitchen docusate sodium 100 MG CAPS Take 100 mg by mouth 2 (two) times daily. 02/12/2016: PRN only  . fenofibrate 160 MG tablet Take 1 tablet (160 mg total) by mouth daily.   . fluticasone (FLONASE) 50 MCG/ACT nasal spray Place 2 sprays into both nostrils daily.   . hydrochlorothiazide (HYDRODIURIL) 25 MG tablet Take 1 tablet (25 mg total) by mouth daily.   . hyoscyamine (LEVSIN SL) 0.125 MG SL tablet Place 1 tablet (0.125 mg total) under the tongue every 4 (four) hours as needed.   Marland Kitchen LYRICA 50 MG capsule Take  by mouth 4 (four) times daily as needed. Prescribed by pain clinic and pt usually takes 2 to 3 daily if needed   . Multiple Vitamin (MULTIVITAMIN WITH MINERALS) TABS tablet Take 1 tablet by mouth daily.   Marland Kitchen omeprazole (PRILOSEC) 40 MG capsule 1 po bid   . polyethylene glycol (MIRALAX / GLYCOLAX) packet Take 17 g by mouth 2 (two) times daily. (Patient taking differently: Take 17 g by mouth as needed. Pt takes every 2 to 3 days if needed)   . rOPINIRole (REQUIP) 0.5 MG tablet Take 1 tablet (0.5 mg total) by mouth 2 (two) times daily.   . traMADol (ULTRAM) 50 MG tablet TAKE 2 TABLETS BY MOUTH THREE TIMES DAILY AS NEEDED FOR CHRONIC PAIN   . valACYclovir (VALTREX) 1000 MG tablet Take 1 tablet (1,000 mg total) by mouth 2 (two) times daily.   . VENTOLIN HFA 108 (90 Base) MCG/ACT inhaler INHALE 2 PUFFS INTO LUNGS EVERY 6 HOURS AS NEEDED FOR WHEEZING OR SHORTNESS OF BREATH   . [DISCONTINUED] escitalopram (LEXAPRO) 20 MG tablet Take 1 tablet (20 mg total) by mouth daily.   . [DISCONTINUED] zolpidem (AMBIEN) 5 MG tablet Take 1 tablet (5 mg total) by mouth at bedtime as needed. for sleep   . EPINEPHrine (EPIPEN 2-PAK) 0.3 mg/0.3 mL IJ SOAJ injection As directed   . ezetimibe (ZETIA) 10 MG tablet Take 1 tablet (10 mg total) by mouth daily. (Patient not taking: Reported on 05/09/2019) 05/09/2019: Michela Pitcher she swelled up when taking this med  . Fluocinolone Acetonide (  CAPEX) 0.01 % SHAM Massage in scalp and leave on 5 min then rinse (Patient not taking: Reported on 05/09/2019) 05/09/2019: Never picked up  . metroNIDAZOLE (FLAGYL) 500 MG tablet Take 1 tablet (500 mg total) by mouth 2 (two) times daily. (Patient not taking: Reported on 05/08/2019)   . niacin (GNP NIACIN) 250 MG tablet Take 500 mg by mouth daily with breakfast.    . Omega-3 Fatty Acids (FISH OIL) 1200 MG CAPS Take 2,400 capsules by mouth daily.   Marland Kitchen OVER THE COUNTER MEDICATION Take 2 tablets by mouth daily. TUMERIC   . OVER THE COUNTER MEDICATION Take 2  capsules by mouth 2 (two) times daily.    No facility-administered encounter medications on file as of 05/09/2019.     Current Diagnosis/Assessment:  Goals Addressed            This Visit's Progress   . Pharmacy Care Plan       CARE PLAN ENTRY  Current Barriers:  . Chronic Disease Management support, education, and care coordination needs related to Asthma, Pre-Diabetes, Hypertension, Hyperlipidemia, RLS/Neuropathy, Pain, Insomnia, Depression, GERD, Osteopenia, Allergic Rhrinitis    Hypertension . Pharmacist Clinical Goal(s): o Over the next 90 days, patient will work with PharmD and providers to maintain BP goal <140/90 . Current regimen:  o  Hctz 25mg  daily  . Patient self care activities - Over the next 90 days, patient will: o Ensure daily salt intake < 2300 mg/[name]  Hyperlipidemia . Pharmacist Clinical Goal(s): o Over the next 90 days, patient will work with PharmD and providers to maintain LDL goal <100 . Current regimen:  o Fenofibrate 160mg  daily . Patient self care activities - Over the next 90 days, patient will: o Maintain cholesterol medication regimen.   Pre-Diabetes . Pharmacist Clinical Goal(s): o Over the next 90 days, patient will work with PharmD and providers to maintain A1c goal <6.5% . Current regimen:  o Diet and exercise management   . Patient self care activities - Over the next 90 days, patient will: o Limit carbohydrate intake (30-45 grams per meal)  Depression . Pharmacist Clinical Goal(s): o Over the next 90 days, patient will work with PharmD and providers to reduce symptoms of depression/anxiety . Current regimen:  o Escitalopram 20mg  daily HS, abilify 2mg  daily . Patient self care activities - Over the next 90 days, patient will: o Maintain depression medication regimen  Medication management . Pharmacist Clinical Goal(s): o Over the next 90 days, patient will work with PharmD and providers to maintain optimal medication  adherence . Current pharmacy: [facility]  . Interventions o Comprehensive medication review performed. o Continue current medication management strategy . Patient self care activities - Over the next 90 days, patient will: o Focus on medication adherence by filling medications appropriately  o Take medications as prescribed o Report any questions or concerns to PharmD and/or provider(s)  Initial goal documentation        Asthma / Tobacco   Last spirometry score: None noted   Eosinophil count:   Lab Results  Component Value Date/Time   EOSPCT 2.5 02/24/2019 01:35 PM  %                               Eos (Absolute):  Lab Results  Component Value Date/Time   EOSABS 0.2 02/24/2019 01:35 PM    Tobacco Status:  Social History   Tobacco Use  Smoking Status Former Smoker  .  Packs/Fabricio Endsley: 0.25  . Years: 20.00  . Pack years: 5.00  . Types: Cigarettes  . Quit date: 01/13/1988  . Years since quitting: 31.3  Smokeless Tobacco Never Used    Patient has failed these meds in past: None noted  Patient is currently controlled on the following medications: albuterol inhaler as needed Using maintenance inhaler regularly? No (not prescribed) Frequency of rescue inhaler use:  several times per month  Plan -Continue current medications  Pre-Diabetes   Recent Relevant Labs: Lab Results  Component Value Date/Time   HGBA1C 5.8 04/10/2014 04:40 PM    Patient is currently controlled on the following medications: None  We discussed: diet and exercise extensively  Plan -Continue control with diet and exercise   Hypertension   BP today is: Unable to assess due to phone visit   Office blood pressures are  BP Readings from Last 3 Encounters:  09/30/18 124/60  09/14/18 124/74  08/30/18 120/62   Blood pressure goal <140/90  Patient has failed these meds in the past: None noted  Patient is currently controlled on the following medications: hctz 25mg  daily (does  not take every Noura Purpura; about 3 per week)  Usually takes hctz when she sees swelling in her legs  Patient checks BP at home infrequently (does not have BP cuff)  Patient home BP readings are ranging: N/A  Plan -Continue current medications   Hyperlipidemia   Lipid Panel     Component Value Date/Time   CHOL 149 02/24/2019 1335   CHOL 217 05/24/2012 1120   TRIG 141.0 02/24/2019 1335   TRIG 164 05/24/2012 1120   HDL 52.00 02/24/2019 1335   CHOLHDL 3 02/24/2019 1335   VLDL 28.2 02/24/2019 1335   LDLCALC 69 02/24/2019 1335   LDLCALC 128 (H) 09/30/2018 1526   LDLDIRECT 131.0 10/01/2016 1549    LDL goal <100  The 10-year ASCVD risk score Mikey Bussing DC Jr., et al., 2013) is: 20.6%   Values used to calculate the score:     Age: 6 years     Sex: Female     Is Non-Hispanic African American: No     Diabetic: Yes     Tobacco smoker: No     Systolic Blood Pressure: [id number] mmHg     Is BP treated: Yes     HDL Cholesterol: 52 mg/dL     Total Cholesterol: 149 mg/dL   Patient has failed these meds in past: all statins (myalgias) Patient is currently controlled on the following medications: fenofibrate 160mg  daily (reports she doesn't take daily)  "Statins about killed me!"   Plan -Continue current medications  Restless Leg Syndrome/Neuropathy    Patient has failed these meds in past: None noted  Patient is currently controlled on the following medications: ropinirole 0.5mg  #2 HS, lyrica 50mg  2-3 times per Hashem Goynes  Lyrica (pain management; [facility]; Levy Pupa Will sometimes take 100mg  Feel like I'm walking on rocks  Plan -Continue current medications   Pain    Patient has failed these meds in past: None noted  Patient is currently controlled on the following medications: tramadol 50mg  #2 BID, cyclobenzaprine 10mg  1-2 times per Suad Autrey  Plan -Continue current medications   Insomnia    Patient has failed these meds in past: None noted Patient is currently controlled on the  following medications: zolpidem 5mg  nightly  Plan -Continue current medications  Depression    Patient has failed these meds in past: None noted  Patient is currently controlled on the following medications: escitalopram  20mg  daily HS, abilify 2mg  daily  Feels like she is doing better since she increased dose  Plan -Continue current medications   GERD    Patient has failed these meds in past: None noted  Patient is currently controlled on the following medications: omeprazole 40mg  BID, Hyoscyamine 0.125mg  PRN (uses rarely; 1-2 times per month)  Reports she has ulcers and a hernia Takes 2 hours for food to digest.   Breakthrough Sx:  Breakthrough Tx: Tums Triggers:  Plan -Continue current medications   Osteopenia   12/10/2015: DEXA - Osteopenia  T-Score: -1.7 Dual Femur Neck Left  Patient has failed these meds in past: None noted  Patient is currently controlled on the following medications: alendronate 70mg  weekly on Friday or Saturday, calcium/magnesium/vitamin D 500-250-125mg  daily  Bad about not taking this medication Falls often. [place] on concrete a few weeks ago.  Feels like her artificial knee keeps her from picking up her foot properly which causes her to fall.   We discussed:  Fall prevention strategies  Plan -Continue current medications   Allergic Rhinitis    Patient has failed these meds in past: None noted  Patient is currently controlled on the following medications: Fluticasone 50mcg 2 sprays each nostril daily  Plan -Continue current medications     Miscellaneous Meds Premarin PRN Miralax every 2-3 days Turmeric, milk thistle  Valtrex  Meds to D/C from list Ezetimibe Fluocinolone Metronidazole  Niacin

## 2019-05-10 ENCOUNTER — Other Ambulatory Visit: Payer: Self-pay

## 2019-05-10 DIAGNOSIS — E785 Hyperlipidemia, unspecified: Secondary | ICD-10-CM

## 2019-05-15 ENCOUNTER — Other Ambulatory Visit: Payer: Self-pay | Admitting: Family Medicine

## 2019-05-15 ENCOUNTER — Encounter: Payer: Self-pay | Admitting: Family Medicine

## 2019-05-15 DIAGNOSIS — G4700 Insomnia, unspecified: Secondary | ICD-10-CM

## 2019-05-15 DIAGNOSIS — Z79891 Long term (current) use of opiate analgesic: Secondary | ICD-10-CM | POA: Diagnosis not present

## 2019-05-16 MED ORDER — ZOLPIDEM TARTRATE 5 MG PO TABS: 5.0000 mg | ORAL_TABLET | Freq: Every evening | ORAL | 0 refills | Status: DC | PRN

## 2019-05-16 NOTE — Telephone Encounter (Signed)
PCP out of office. Will you refill in her absence please?

## 2019-05-26 ENCOUNTER — Telehealth: Payer: Self-pay | Admitting: Family Medicine

## 2019-05-26 ENCOUNTER — Other Ambulatory Visit: Payer: Self-pay | Admitting: Family Medicine

## 2019-05-26 NOTE — Telephone Encounter (Signed)
FYI

## 2019-05-26 NOTE — Telephone Encounter (Signed)
Chales Salmon NP works for [employer] called stating patient had an appointment at her office for weight loss. Lenda Kelp states patient was requesting stimulus weight loss medicine. Also, the patient tries to take some of their insulin needle. The patient had a neurotic behavior, pacing back and forth. Lenda Kelp did not prescribe any stimulus medication to the patient. Lenda Kelp is concern with patient behavior.  Chales Salmon NP phone number is [phone].

## 2019-05-26 NOTE — Patient Instructions (Signed)
Visit Information  Goals Addressed            This Visit's Progress   . Pharmacy Care Plan       CARE PLAN ENTRY  Current Barriers:  . Chronic Disease Management support, education, and care coordination needs related to Asthma, Pre-Diabetes, Hypertension, Hyperlipidemia, RLS/Neuropathy, Pain, Insomnia, Depression, GERD, Osteopenia, Allergic Rhrinitis    Hypertension . Pharmacist Clinical Goal(s): o Over the next 90 days, patient will work with PharmD and providers to maintain BP goal <140/90 . Current regimen:  o  Hctz 25mg  daily  . Patient self care activities - Over the next 90 days, patient will: o Ensure daily salt intake < 2300 mg/[name]  Hyperlipidemia . Pharmacist Clinical Goal(s): o Over the next 90 days, patient will work with PharmD and providers to maintain LDL goal <100 . Current regimen:  o Fenofibrate 160mg  daily . Patient self care activities - Over the next 90 days, patient will: o Maintain cholesterol medication regimen.   Pre-Diabetes . Pharmacist Clinical Goal(s): o Over the next 90 days, patient will work with PharmD and providers to maintain A1c goal <6.5% . Current regimen:  o Diet and exercise management   . Patient self care activities - Over the next 90 days, patient will: o Limit carbohydrate intake (30-45 grams per meal)  Depression . Pharmacist Clinical Goal(s): o Over the next 90 days, patient will work with PharmD and providers to reduce symptoms of depression/anxiety . Current regimen:  o Escitalopram 20mg  daily HS, abilify 2mg  daily . Patient self care activities - Over the next 90 days, patient will: o Maintain depression medication regimen  Medication management . Pharmacist Clinical Goal(s): o Over the next 90 days, patient will work with PharmD and providers to maintain optimal medication adherence . Current pharmacy: [facility]  . Interventions o Comprehensive medication review performed. o Continue current  medication management strategy . Patient self care activities - Over the next 90 days, patient will: o Focus on medication adherence by filling medications appropriately  o Take medications as prescribed o Report any questions or concerns to PharmD and/or provider(s)  Initial goal documentation        [name] was given information about Chronic Care Management services today including:  1. CCM service includes personalized support from designated clinical staff supervised by her physician, including individualized plan of care and coordination with other care providers 2. 24/7 contact phone numbers for assistance for urgent and routine care needs. 3. Standard insurance, coinsurance, copays and deductibles apply for chronic care management only during months in which we provide at least 20 minutes of these services. Most insurances cover these services at 100%, however patients may be responsible for any copay, coinsurance and/or deductible if applicable. This service may help you avoid the need for more expensive face-to-face services. 4. Only one practitioner may furnish and bill the service in a calendar month. 5. The patient may stop CCM services at any time (effective at the end of the month) by phone call to the office staff.  Patient agreed to services and verbal consent obtained.   The patient verbalized understanding of instructions provided today and agreed to receive a mailed copy of patient instruction and/or educational materials. Telephone follow up appointment with pharmacy team member scheduled for: 08/18/19  Melvenia Beam Jenniah Bhavsar, PharmD Clinical Pharmacist [facility] [phone]    Mindfulness-Based Stress Reduction Mindfulness-based stress reduction (MBSR) is a program that helps people learn to practice mindfulness. Mindfulness  is the practice of intentionally paying attention to the present moment. It can be learned and practiced through  techniques such as education, breathing exercises, meditation, and yoga. MBSR includes several mindfulness techniques in one program. MBSR works best when you understand the treatment, are willing to try new things, and can commit to spending time practicing what you learn. MBSR training may include learning about:  How your emotions, thoughts, and reactions affect your body.  New ways to respond to things that cause negative thoughts to start (triggers).  How to notice your thoughts and let go of them.  Practicing awareness of everyday things that you normally do without thinking.  The techniques and goals of different types of meditation. What are the benefits of MBSR? MBSR can have many benefits, which include helping you to:  Develop self-awareness. This refers to knowing and understanding yourself.  Learn skills and attitudes that help you to participate in your own health care.  Learn new ways to care for yourself.  Be more accepting about how things are, and let things go.  Be less judgmental and approach things with an open mind.  Be patient with yourself and trust yourself more. MBSR has also been shown to:  Reduce negative emotions, such as depression and anxiety.  Improve memory and focus.  Change how you sense and approach pain.  Boost your body's ability to fight infections.  Help you connect better with other people.  Improve your sense of well-being. Follow these instructions at home:   Find a local in-person or online MBSR program.  Set aside some time regularly for mindfulness practice.  Find a mindfulness practice that works best for you. This may include one or more of the following: ? Meditation. Meditation involves focusing your mind on a certain thought or activity. ? Breathing awareness exercises. These help you to stay present by focusing on your breath. ? Body scan. For this practice, you lie down and pay attention to each part of your body  from head to toe. You can identify tension and soreness and intentionally relax parts of your body. ? Yoga. Yoga involves stretching and breathing, and it can improve your ability to move and be flexible. It can also provide an experience of testing your body's limits, which can help you release stress. ? Mindful eating. This way of eating involves focusing on the taste, texture, color, and smell of each bite of food. Because this slows down eating and helps you feel full sooner, it can be an important part of a weight-loss plan.  Find a podcast or recording that provides guidance for breathing awareness, body scan, or meditation exercises. You can listen to these any time when you have a free moment to rest without distractions.  Follow your treatment plan as told by your health care provider. This may include taking regular medicines and making changes to your diet or lifestyle as recommended. How to practice mindfulness To do a basic awareness exercise:  Find a comfortable place to sit.  Pay attention to the present moment. Observe your thoughts, feelings, and surroundings just as they are.  Avoid placing judgment on yourself, your feelings, or your surroundings. Make note of any judgment that comes up, and let it go.  Your mind may wander, and that is okay. Make note of when your thoughts drift, and return your attention to the present moment. To do basic mindfulness meditation:  Find a comfortable place to sit. This may include a stable chair or  a firm floor cushion. ? Sit upright with your back straight. Let your arms fall next to your side with your hands resting on your legs. ? If sitting in a chair, rest your feet flat on the floor. ? If sitting on a cushion, cross your legs in front of you.  Keep your head in a neutral position with your chin dropped slightly. Relax your jaw and rest the tip of your tongue on the roof of your mouth. Drop your gaze to the floor. You can close your  eyes if you like.  Breathe normally and pay attention to your breath. Feel the air moving in and out of your nose. Feel your belly expanding and relaxing with each breath.  Your mind may wander, and that is okay. Make note of when your thoughts drift, and return your attention to your breath.  Avoid placing judgment on yourself, your feelings, or your surroundings. Make note of any judgment or feelings that come up, let them go, and bring your attention back to your breath.  When you are ready, lift your gaze or open your eyes. Pay attention to how your body feels after the meditation. Where to find more information You can find more information about MBSR from:  Your health care provider.  Community-based meditation centers or programs.  Programs offered near you. Summary  Mindfulness-based stress reduction (MBSR) is a program that teaches you how to intentionally pay attention to the present moment. It is used with other treatments to help you cope better with daily stress, emotions, and pain.  MBSR focuses on developing self-awareness, which allows you to respond to life stress without judgment or negative emotions.  MBSR programs may involve learning different mindfulness practices, such as breathing exercises, meditation, yoga, body scan, or mindful eating. Find a mindfulness practice that works best for you, and set aside time for it on a regular basis. This information is not intended to replace advice given to you by your health care provider. Make sure you discuss any questions you have with your health care provider. Document Revised: 12/11/2016 Document Reviewed: 05/07/2016 Elsevier Patient Education  [place].

## 2019-05-28 ENCOUNTER — Other Ambulatory Visit: Payer: Self-pay | Admitting: Family Medicine

## 2019-05-29 NOTE — Telephone Encounter (Signed)
noted 

## 2019-05-29 NOTE — Telephone Encounter (Signed)
Pt will get no more controlled sub from this office  Pt needs psychiatrist

## 2019-05-31 ENCOUNTER — Encounter: Payer: Self-pay | Admitting: Family Medicine

## 2019-05-31 NOTE — Telephone Encounter (Signed)
Please advise 

## 2019-05-31 NOTE — Telephone Encounter (Signed)
She can make her own app for mammogram  Is she asking for new referral to GI?  We can send to [facility] if she like ?

## 2019-06-01 ENCOUNTER — Other Ambulatory Visit: Payer: Self-pay | Admitting: Family Medicine

## 2019-06-01 ENCOUNTER — Encounter: Payer: Self-pay | Admitting: Family Medicine

## 2019-06-01 DIAGNOSIS — I10 Essential (primary) hypertension: Secondary | ICD-10-CM

## 2019-06-08 ENCOUNTER — Other Ambulatory Visit: Payer: Self-pay | Admitting: Family Medicine

## 2019-06-08 ENCOUNTER — Encounter: Payer: Self-pay | Admitting: Family Medicine

## 2019-06-08 DIAGNOSIS — N760 Acute vaginitis: Secondary | ICD-10-CM

## 2019-06-08 DIAGNOSIS — H2511 Age-related nuclear cataract, right eye: Secondary | ICD-10-CM | POA: Diagnosis not present

## 2019-06-08 DIAGNOSIS — B9689 Other specified bacterial agents as the cause of diseases classified elsewhere: Secondary | ICD-10-CM

## 2019-06-08 MED ORDER — METRONIDAZOLE 500 MG PO TABS: 500.0000 mg | ORAL_TABLET | Freq: Two times a day (BID) | ORAL | 0 refills | Status: DC

## 2019-06-08 NOTE — Telephone Encounter (Signed)
Please advise 

## 2019-06-08 NOTE — Telephone Encounter (Signed)
Flagyl sent in 0--- she will need ov if symptoms persist

## 2019-06-13 ENCOUNTER — Other Ambulatory Visit: Payer: Self-pay

## 2019-06-13 DIAGNOSIS — G4700 Insomnia, unspecified: Secondary | ICD-10-CM

## 2019-06-13 NOTE — Telephone Encounter (Signed)
Are we still prescribing this for patient? Please advise

## 2019-06-14 ENCOUNTER — Encounter: Payer: Self-pay | Admitting: Family Medicine

## 2019-06-14 NOTE — Telephone Encounter (Signed)
See below

## 2019-06-15 ENCOUNTER — Encounter: Payer: Self-pay | Admitting: Family Medicine

## 2019-06-20 NOTE — Telephone Encounter (Signed)
See messages from patient

## 2019-06-20 NOTE — Telephone Encounter (Signed)
See messages from patient.Marland KitchenMarland Kitchen

## 2019-06-20 NOTE — Telephone Encounter (Signed)
I wanted you all (kim and amanda) to see these--- she is coming in on Thursday to discuss

## 2019-06-22 ENCOUNTER — Ambulatory Visit: Payer: Medicare HMO | Admitting: Family Medicine

## 2019-06-23 ENCOUNTER — Encounter: Payer: Self-pay | Admitting: Family Medicine

## 2019-06-23 ENCOUNTER — Other Ambulatory Visit: Payer: Self-pay

## 2019-06-23 ENCOUNTER — Ambulatory Visit (INDEPENDENT_AMBULATORY_CARE_PROVIDER_SITE_OTHER): Payer: Medicare HMO | Admitting: Family Medicine

## 2019-06-23 DIAGNOSIS — J9801 Acute bronchospasm: Secondary | ICD-10-CM

## 2019-06-23 DIAGNOSIS — E785 Hyperlipidemia, unspecified: Secondary | ICD-10-CM

## 2019-06-23 DIAGNOSIS — M8580 Other specified disorders of bone density and structure, unspecified site: Secondary | ICD-10-CM | POA: Diagnosis not present

## 2019-06-23 DIAGNOSIS — E669 Obesity, unspecified: Secondary | ICD-10-CM

## 2019-06-23 DIAGNOSIS — G4700 Insomnia, unspecified: Secondary | ICD-10-CM | POA: Diagnosis not present

## 2019-06-23 MED ORDER — FLOVENT HFA 110 MCG/ACT IN AERO: 2.0000 | INHALATION_SPRAY | Freq: Two times a day (BID) | RESPIRATORY_TRACT | 12 refills | Status: DC

## 2019-06-23 NOTE — Assessment & Plan Note (Signed)
Encouraged heart healthy diet, increase exercise, avoid trans fats, consider a krill oil cap daily  Lab Results  Component Value Date   CHOL 149 02/24/2019   HDL 52.00 02/24/2019   LDLCALC 69 02/24/2019   LDLDIRECT 131.0 10/01/2016   TRIG 141.0 02/24/2019   CHOLHDL 3 02/24/2019

## 2019-06-23 NOTE — Assessment & Plan Note (Signed)
Uses ambien prn.  

## 2019-06-23 NOTE — Patient Instructions (Signed)
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
## 2019-06-23 NOTE — Telephone Encounter (Signed)
Is she asking for something--- I cant tell.

## 2019-06-23 NOTE — Assessment & Plan Note (Signed)
Pt interested in healthy weight and wellness

## 2019-06-25 ENCOUNTER — Other Ambulatory Visit: Payer: Self-pay | Admitting: Family Medicine

## 2019-06-28 ENCOUNTER — Encounter: Payer: Self-pay | Admitting: Family Medicine

## 2019-06-28 NOTE — Telephone Encounter (Signed)
Ok to give her a new letter ---  There should be one in there from last time we did it--- it can be copied

## 2019-07-10 DIAGNOSIS — M2031 Hallux varus (acquired), right foot: Secondary | ICD-10-CM | POA: Diagnosis not present

## 2019-07-10 DIAGNOSIS — M79671 Pain in right foot: Secondary | ICD-10-CM | POA: Diagnosis not present

## 2019-07-10 DIAGNOSIS — M2032 Hallux varus (acquired), left foot: Secondary | ICD-10-CM | POA: Diagnosis not present

## 2019-07-10 DIAGNOSIS — M7742 Metatarsalgia, left foot: Secondary | ICD-10-CM | POA: Diagnosis not present

## 2019-07-10 DIAGNOSIS — M79672 Pain in left foot: Secondary | ICD-10-CM | POA: Diagnosis not present

## 2019-07-19 ENCOUNTER — Ambulatory Visit (INDEPENDENT_AMBULATORY_CARE_PROVIDER_SITE_OTHER): Payer: Medicare HMO | Admitting: Obstetrics & Gynecology

## 2019-07-19 ENCOUNTER — Encounter: Payer: Self-pay | Admitting: Obstetrics & Gynecology

## 2019-07-19 ENCOUNTER — Other Ambulatory Visit (HOSPITAL_COMMUNITY)
Admission: RE | Admit: 2019-07-19 | Discharge: 2019-07-19 | Disposition: A | Discharge status: Home / Self Care | Payer: Medicare HMO | Source: Ambulatory Visit | Attending: Obstetrics & Gynecology | Admitting: Obstetrics & Gynecology

## 2019-07-19 ENCOUNTER — Other Ambulatory Visit: Payer: Self-pay

## 2019-07-19 VITALS — BP 108/52 | HR 86 | Ht <= 58 in | Wt 150.0 lb

## 2019-07-19 DIAGNOSIS — Z01419 Encounter for gynecological examination (general) (routine) without abnormal findings: Secondary | ICD-10-CM

## 2019-07-19 DIAGNOSIS — Z13820 Encounter for screening for osteoporosis: Secondary | ICD-10-CM

## 2019-07-19 DIAGNOSIS — N898 Other specified noninflammatory disorders of vagina: Secondary | ICD-10-CM | POA: Insufficient documentation

## 2019-07-19 DIAGNOSIS — R300 Dysuria: Secondary | ICD-10-CM | POA: Diagnosis not present

## 2019-07-19 DIAGNOSIS — Z0000 Encounter for general adult medical examination without abnormal findings: Secondary | ICD-10-CM

## 2019-07-19 DIAGNOSIS — Z1151 Encounter for screening for human papillomavirus (HPV): Secondary | ICD-10-CM | POA: Diagnosis not present

## 2019-07-19 NOTE — Patient Instructions (Signed)
GO WHITE: Soap: UNSCENTED Dove (white box light green writing) Laundry detergent (underwear)- Dreft or [employer] unscented WHITE 100% cotton panties (NOT just cotton crouch) Sanitary napkin/panty liners: UNSCENTED.  If it doesn't SAY unscented it can have a scent/perfume    NO PERFUMES OR LOTIONS OR POTIONS in the vulvar area (may use regular [place]) Condoms: hypoallergenic only. Non dyed (no color) Toilet papers: white only Wash clothes: use a separate wash cloth. WHITE.  Washed in Dreft.  

## 2019-07-19 NOTE — Progress Notes (Signed)
Subjective:     [name] is a 69 y.o. female here for a routine exam.  Current complaints: pt reports recurrent BV. She reports that she is sensitive to different types of toilet paper and occ has tender bumps that are not present currently.    Pt reports that after she voids she has leakage that follows.  She is s/p supracervical hysterectomy with bladder neck suspension and rectocele repair years ago. She reports that the leakage is not even enough for her to wet a pad. She occ uses topical EES cream.   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 She traveled with her dog and 2 cats. Her last trip was to [place] and she left her mobile home there as it needs repair.    Gynecologic History No LMP recorded. Patient has had a hysterectomy. Contraception: post menopausal status Last Pap: 2017. Results were: normal Last mammogram: >3 years prev.  Results were: normal. She decided not to get one annually as she did not think that she would have a problem.    Obstetric History OB History  Gravida Para Term Preterm AB Living  4 4 4     3   SAB TAB Ectopic Multiple Live Births          3    # Outcome Date GA Lbr Len/2nd Weight Sex Delivery Anes PTL Lv  4 Term 39 [redacted]w[redacted]d   M CS-Classical Spinal N LIV  3 Term 1985 [redacted]w[redacted]d   M CS-Classical Spinal N LIV  2 Term 1983 [redacted]w[redacted]d   F CS-Classical Spinal N LIV  1 Term 1979 [redacted]w[redacted]d   F CS-Classical Spinal N    The following portions of the patient's history were reviewed and updated as appropriate: allergies, current medications, past family history, past medical history, past social history, past surgical history and problem list.  Review of Systems Pertinent items are noted in HPI.    Objective:  BP (!) 108/52   Pulse 86   Ht 4\' 7"  (1.397 m)   Wt 150 lb (68 kg)   BMI 34.86 kg/m   General Appearance:    Alert, cooperative, no distress, appears stated age  Head:    Normocephalic, without obvious  abnormality, atraumatic  Eyes:    conjunctiva/corneas clear, EOM's intact, both eyes  Ears:    Normal external ear canals, both ears  Nose:   Nares normal, septum midline, mucosa normal, no drainage    or sinus tenderness  Throat:   Lips, mucosa, and tongue normal; teeth and gums normal  Neck:   Supple, symmetrical, trachea midline, no adenopathy;    thyroid:  no enlargement/tenderness/nodules  Back:     Symmetric, no curvature, ROM normal, no CVA tenderness  Lungs:     respirations unlabored  Chest Wall:    No tenderness or deformity   Heart:    Regular rate and rhythm  Breast Exam:    No tenderness, masses, or nipple abnormality  Abdomen:     Soft, non-tender, bowel sounds active all four quadrants,    no masses, no organomegaly  Genitalia:    Normal female without lesion, discharge or tenderness   Grade II prolapse of cervix and bladder. The rectum is lax but, there is no prolapse.     Extremities:   Extremities normal, atraumatic, no cyanosis or edema  Pulses:   2+ and symmetric all extremities  Skin:   Skin color, texture,  turgor normal, no rashes or lesions    Assessment:    Healthy female exam.   Recurrent BV per pt. Will obtain Affirm  Breast cancer screen by mammogram    Plan:     Diagnoses and all orders for this visit:  Well woman exam with routine gynecological exam -     MM DIGITAL SCREENING BILATERAL; Future -     Cytology - PAP( [facility]) -     DG Bone Density; Future  Vaginal irritation -     Cervicovaginal ancillary only( [place]) -     Urine Culture  Healthcare maintenance -     DG Bone Density; Future  Screening for osteoporosis -     DG Bone Density; Future  f/u in 1 year or sooner prn  Sheenah Dimitroff L. Harraway-Smith, M.D., Cherlynn June

## 2019-07-21 LAB — CYTOLOGY - PAP
Comment: NEGATIVE
Diagnosis: NEGATIVE
High risk HPV: NEGATIVE

## 2019-07-21 LAB — CERVICOVAGINAL ANCILLARY ONLY
Bacterial Vaginitis (gardnerella): NEGATIVE
Candida Glabrata: NEGATIVE
Candida Vaginitis: NEGATIVE
Comment: NEGATIVE
Comment: NEGATIVE
Comment: NEGATIVE
Comment: NEGATIVE
Trichomonas: NEGATIVE

## 2019-07-23 LAB — URINE CULTURE

## 2019-08-03 DIAGNOSIS — H2511 Age-related nuclear cataract, right eye: Secondary | ICD-10-CM | POA: Diagnosis not present

## 2019-08-10 ENCOUNTER — Other Ambulatory Visit: Payer: Self-pay

## 2019-08-10 ENCOUNTER — Ambulatory Visit (HOSPITAL_BASED_OUTPATIENT_CLINIC_OR_DEPARTMENT_OTHER)
Admission: RE | Admit: 2019-08-10 | Discharge: 2019-08-10 | Disposition: A | Discharge status: Home / Self Care | Payer: Medicare HMO | Source: Ambulatory Visit | Attending: Obstetrics & Gynecology | Admitting: Obstetrics & Gynecology

## 2019-08-10 DIAGNOSIS — Z780 Asymptomatic menopausal state: Secondary | ICD-10-CM | POA: Diagnosis not present

## 2019-08-10 DIAGNOSIS — Z1231 Encounter for screening mammogram for malignant neoplasm of breast: Secondary | ICD-10-CM | POA: Insufficient documentation

## 2019-08-10 DIAGNOSIS — Z13820 Encounter for screening for osteoporosis: Secondary | ICD-10-CM | POA: Insufficient documentation

## 2019-08-10 DIAGNOSIS — M8580 Other specified disorders of bone density and structure, unspecified site: Secondary | ICD-10-CM | POA: Insufficient documentation

## 2019-08-10 DIAGNOSIS — Z87891 Personal history of nicotine dependence: Secondary | ICD-10-CM | POA: Diagnosis not present

## 2019-08-10 DIAGNOSIS — M8589 Other specified disorders of bone density and structure, multiple sites: Secondary | ICD-10-CM | POA: Diagnosis not present

## 2019-08-10 DIAGNOSIS — Z0000 Encounter for general adult medical examination without abnormal findings: Secondary | ICD-10-CM

## 2019-08-10 DIAGNOSIS — Z01419 Encounter for gynecological examination (general) (routine) without abnormal findings: Secondary | ICD-10-CM

## 2019-08-10 DIAGNOSIS — R29890 Loss of height: Secondary | ICD-10-CM | POA: Diagnosis not present

## 2019-08-18 ENCOUNTER — Ambulatory Visit: Payer: Medicare HMO | Admitting: Pharmacist

## 2019-08-18 NOTE — Chronic Care Management (AMB) (Signed)
Chronic Care Management Pharmacy  Name: [name]  MRN: [id number] DOB: 15-Sep-1950  Chief Complaint/ HPI  [name],  69 y.o. , female presents for their Follow-Up CCM visit with the clinical pharmacist via telephone due to COVID-19 Pandemic.  PCP : [name], DO  Their chronic conditions include: Asthma, Pre-Diabetes, Hypertension, Hyperlipidemia, RLS/Neuropathy, Pain, Insomnia, Depression, GERD, Osteopenia, Allergic Rhrinitis   Office Visits: 06/23/19: Visit w/ [name] - Started Fluticasone inhaler for asthma. Referral to Medical Weight Mangement  Consult Visit: 07/10/19: Ortho visit w/ Dr. Doran Durand   07/19/19: Ob/Gyn visit w/ Dr. Ihor Dow - Concern for recurrent BV.  Medications: Outpatient Encounter Medications as of 08/18/2019  Medication Sig Note  . alendronate (FOSAMAX) 70 MG tablet TAKE 1 TABLET BY MOUTH ONCE A WEEK. TAKE  WITH  A  FULL  GLASS  OF  WATER  ON  AN  EMPTY  STOMACH   . ARIPiprazole (ABILIFY) 2 MG tablet Take 1 tablet (2 mg total) by mouth daily.   . Calcium-Magnesium-Vitamin D [phone] MG-MG-UNIT TABS Take 1 tablet by mouth daily.   Marland Kitchen conjugated estrogens (PREMARIN) vaginal cream 0.5 g  2-3 days a week   . cyclobenzaprine (FLEXERIL) 10 MG tablet Take 1 tablet (10 mg total) by mouth 2 (two) times daily as needed for muscle spasms.   Marland Kitchen docusate sodium 100 MG CAPS Take 100 mg by mouth 2 (two) times daily. 02/12/2016: PRN only  . EPINEPHrine (EPIPEN 2-PAK) 0.3 mg/0.3 mL IJ SOAJ injection As directed   . escitalopram (LEXAPRO) 20 MG tablet Take 1 tablet (20 mg total) by mouth daily.   . fenofibrate 160 MG tablet Take 1 tablet (160 mg total) by mouth daily.   . fluticasone (FLONASE) 50 MCG/ACT nasal spray Place 2 sprays into both nostrils daily.   . fluticasone (FLOVENT HFA) 110 MCG/ACT inhaler Inhale 2 puffs into the lungs in the morning and at bedtime.   . hydrochlorothiazide (HYDRODIURIL) 25 MG tablet Take 1 tablet by mouth once  daily   . hyoscyamine (LEVSIN SL) 0.125 MG SL tablet Place 1 tablet (0.125 mg total) under the tongue every 4 (four) hours as needed.   Marland Kitchen LYRICA 50 MG capsule Take by mouth 4 (four) times daily as needed. Prescribed by pain clinic and pt usually takes 2 to 3 daily if needed   . metroNIDAZOLE (FLAGYL) 500 MG tablet Take 1 tablet (500 mg total) by mouth 2 (two) times daily. (Patient not taking: Reported on 07/19/2019)   . metroNIDAZOLE (FLAGYL) 500 MG tablet Take 1 tablet (500 mg total) by mouth 2 (two) times daily. (Patient not taking: Reported on 07/19/2019)   . Multiple Vitamin (MULTIVITAMIN WITH MINERALS) TABS tablet Take 1 tablet by mouth daily.   . niacin (GNP NIACIN) 250 MG tablet Take 500 mg by mouth daily with breakfast.  (Patient not taking: Reported on 07/19/2019)   . Omega-3 Fatty Acids (FISH OIL) 1200 MG CAPS Take 2,400 capsules by mouth daily. (Patient not taking: Reported on 07/19/2019)   . omeprazole (PRILOSEC) 40 MG capsule 1 po bid   . OVER THE COUNTER MEDICATION Take 2 tablets by mouth daily. TUMERIC (Patient not taking: Reported on 07/19/2019)   . OVER THE COUNTER MEDICATION Take 2 capsules by mouth 2 (two) times daily. (Patient not taking: Reported on 07/19/2019)   . rOPINIRole (REQUIP) 0.5 MG tablet Take 1 tablet (0.5 mg total) by mouth 2 (two) times daily.   . valACYclovir (VALTREX) 1000 MG tablet  Take 1 tablet (1,000 mg total) by mouth 2 (two) times daily.   . VENTOLIN HFA 108 (90 Base) MCG/ACT inhaler INHALE 2 PUFFS INTO LUNGS EVERY 6 HOURS AS NEEDED FOR WHEEZING OR SHORTNESS OF BREATH   . zolpidem (AMBIEN) 5 MG tablet Take 1 tablet (5 mg total) by mouth at bedtime as needed. for sleep    No facility-administered encounter medications on file as of 08/18/2019.   SDOH Screenings   Alcohol Screen:   . Last Alcohol Screening Score (AUDIT):   Depression (PHQ2-9): Medium Risk  . PHQ-2 Score: 12  Financial Resource Strain:   . Difficulty of Paying Living Expenses:   Food Insecurity:   .  Worried About Charity fundraiser in the Last Year:   . [place] in the Last Year:   Housing:   . Last Housing Risk Score:   Physical Activity:   . Days of Exercise per Week:   . Minutes of Exercise per Session:   Social Connections:   . Frequency of Communication with Friends and Family:   . Frequency of Social Gatherings with Friends and Family:   . Attends Religious Services:   . Active Member of Clubs or Organizations:   . Attends Archivist Meetings:   Marland Kitchen Marital Status:   Stress:   . Feeling of Stress :   Tobacco Use: Medium Risk  . Smoking Tobacco Use: Former Smoker  . Smokeless Tobacco Use: Never Used  Transportation Needs:   . Film/video editor (Medical):   Marland Kitchen Lack of Transportation (Non-Medical):      Current Diagnosis/Assessment:  Goals Addressed            This Visit's Progress   . [place] (see longitudinal plan of care for additional care plan information)  Current Barriers:  . Chronic Disease Management support, education, and care coordination needs related to Asthma, Pre-Diabetes, Hypertension, Hyperlipidemia, RLS/Neuropathy, Pain, Insomnia, Depression, GERD, Osteopenia, Allergic Rhrinitis    Hypertension BP Readings from Last 3 Encounters:  07/19/19 (!) 108/52  06/23/19 128/70  09/30/18 124/60   . Pharmacist Clinical Goal(s): o Over the next 90 days, patient will work with PharmD and providers to maintain BP goal <140/90 . Current regimen:  o Hctz 25mg  daily  . Patient self care activities - Over the next 90 days, patient will: o Ensure daily salt intake < 2300 mg/[name] o Maintain hypertension medication regimen.   Hyperlipidemia Lab Results  Component Value Date/Time   LDLCALC 69 02/24/2019 01:35 PM   LDLCALC 128 (H) 09/30/2018 03:26 PM   LDLDIRECT 131.0 10/01/2016 03:49 PM   . Pharmacist Clinical Goal(s): o Over the next 90 days, patient will work with PharmD and  providers to maintain LDL goal < 100 . Current regimen:  o Fenofibrate 160mg  daily . Patient self care activities - Over the next 90 days, patient will: o Maintain cholesterol medication regimen.   Pre-Diabetes Lab Results  Component Value Date/Time   HGBA1C 5.8 04/10/2014 04:40 PM   . Pharmacist Clinical Goal(s): o Over the next 90 days, patient will work with PharmD and providers to maintain A1c goal <6.5% . Current regimen:  o Diet and exercise management   . Patient self care activities - Over the next 90 days, patient will: o Limit carbohydrate intake (30-45 grams per meal)  Depression . Pharmacist Clinical Goal(s): o Over the next 90 days, patient will work with  PharmD and providers to reduce symptoms of depression/anxiety . Current regimen:  o Escitalopram 20mg  daily HS o Abilify 2mg  daily . Patient self care activities - Over the next 90 days, patient will: o Maintain depression medication regimen   Medication management . Pharmacist Clinical Goal(s): o Over the next 90 days, patient will work with PharmD and providers to maintain optimal medication adherence . Current pharmacy: [facility]  . Interventions o Comprehensive medication review performed. o Continue current medication management strategy . Patient self care activities - Over the next 90 days, patient will: o Focus on medication adherence by filling medications appropriately  o Take medications as prescribed o Report any questions or concerns to PharmD and/or provider(s)  Please see past updates related to this goal by clicking on the "Past Updates" button in the selected goal       Social Hx: Her mom is not doing well.  Having a Environmental manager of some of her mom's things. Is very stressed.  Asthma / Tobacco   Last spirometry score: None noted   Eosinophil count:   Lab Results  Component Value Date/Time   EOSPCT 2.5 02/24/2019 01:35 PM  %                               Eos  (Absolute):  Lab Results  Component Value Date/Time   EOSABS 0.2 02/24/2019 01:35 PM    Tobacco Status:  Social History   Tobacco Use  Smoking Status Former Smoker  . Packs/[name]: 0.25  . Years: 20.00  . Pack years: 5.00  . Types: Cigarettes  . Quit date: 01/13/1988  . Years since quitting: 31.6  Smokeless Tobacco Never Used    Patient has failed these meds in past: None noted  Patient is currently controlled on the following medications:   Fluticasone 181mcg 2 puffs twice daily  Albuterol inhaler as needed Using maintenance inhaler regularly?  Frequency of rescue inhaler use:  several times per month   Update 08/18/19 Has not been taking maintenance inhaler regularly. Doesn't feel SOB now that she is not working her old job. Only uses it when she is SOB.  Gets SOB 3-4 days per week.  Plan -Continue current medications  Depression    Patient has failed these meds in past: None noted  Patient is currently controlled on the following medications: escitalopram 20mg  daily HS, abilify 2mg  daily  Feels like she is doing better since she increased dose  Update 08/18/19 Feels abilify is really helping. Found her previous job stressful, so she resigned from that job.  Plan -Continue current medications   Miscellaneous Meds Premarin PRN Miralax every 2-3 days Turmeric, milk thistle  Valtrex  Meds to D/C from list Ezetimibe Fluocinolone Metronidazole  Niacin

## 2019-08-18 NOTE — Patient Instructions (Signed)
Visit Information  Goals Addressed            This Visit's Progress   . [place] (see longitudinal plan of care for additional care plan information)  Current Barriers:  . Chronic Disease Management support, education, and care coordination needs related to Asthma, Pre-Diabetes, Hypertension, Hyperlipidemia, RLS/Neuropathy, Pain, Insomnia, Depression, GERD, Osteopenia, Allergic Rhrinitis    Hypertension BP Readings from Last 3 Encounters:  07/19/19 (!) 108/52  06/23/19 128/70  09/30/18 124/60   . Pharmacist Clinical Goal(s): o Over the next 90 days, patient will work with PharmD and providers to maintain BP goal <140/90 . Current regimen:  o Hctz 25mg  daily  . Patient self care activities - Over the next 90 days, patient will: o Ensure daily salt intake < 2300 mg/Jasan Doughtie o Maintain hypertension medication regimen.   Hyperlipidemia Lab Results  Component Value Date/Time   LDLCALC 69 02/24/2019 01:35 PM   LDLCALC 128 (H) 09/30/2018 03:26 PM   LDLDIRECT 131.0 10/01/2016 03:49 PM   . Pharmacist Clinical Goal(s): o Over the next 90 days, patient will work with PharmD and providers to maintain LDL goal < 100 . Current regimen:  o Fenofibrate 160mg  daily . Patient self care activities - Over the next 90 days, patient will: o Maintain cholesterol medication regimen.   Pre-Diabetes Lab Results  Component Value Date/Time   HGBA1C 5.8 04/10/2014 04:40 PM   . Pharmacist Clinical Goal(s): o Over the next 90 days, patient will work with PharmD and providers to maintain A1c goal <6.5% . Current regimen:  o Diet and exercise management   . Patient self care activities - Over the next 90 days, patient will: o Limit carbohydrate intake (30-45 grams per meal)  Depression . Pharmacist Clinical Goal(s): o Over the next 90 days, patient will work with PharmD and providers to reduce symptoms of depression/anxiety . Current regimen:   o Escitalopram 20mg  daily HS o Abilify 2mg  daily . Patient self care activities - Over the next 90 days, patient will: o Maintain depression medication regimen   Medication management . Pharmacist Clinical Goal(s): o Over the next 90 days, patient will work with PharmD and providers to maintain optimal medication adherence . Current pharmacy: [facility]  . Interventions o Comprehensive medication review performed. o Continue current medication management strategy . Patient self care activities - Over the next 90 days, patient will: o Focus on medication adherence by filling medications appropriately  o Take medications as prescribed o Report any questions or concerns to PharmD and/or provider(s)  Please see past updates related to this goal by clicking on the "Past Updates" button in the selected goal         The patient verbalized understanding of instructions provided today and agreed to receive a mailed copy of patient instruction and/or educational materials.  Telephone follow up appointment with pharmacy team member scheduled for: 02/19/2020  De Blanch, PharmD Clinical Pharmacist [facility] [phone]

## 2019-08-21 ENCOUNTER — Other Ambulatory Visit: Payer: Self-pay | Admitting: Obstetrics & Gynecology

## 2019-08-21 ENCOUNTER — Encounter: Payer: Self-pay | Admitting: Obstetrics & Gynecology

## 2019-08-21 ENCOUNTER — Telehealth: Payer: Self-pay | Admitting: Obstetrics & Gynecology

## 2019-08-21 DIAGNOSIS — M8580 Other specified disorders of bone density and structure, unspecified site: Secondary | ICD-10-CM | POA: Insufficient documentation

## 2019-08-21 DIAGNOSIS — Z780 Asymptomatic menopausal state: Secondary | ICD-10-CM | POA: Insufficient documentation

## 2019-08-21 NOTE — Telephone Encounter (Signed)
TC to pt. She has Osteopenia. She needs to take Vit D and Ca++.   She is also on Fosamax daily (when she can remember). Pt is still in pt care.  Rec strength training and walking for exercise even though she cant go to the gym.  All questions were answered.   Larri Yehle L. Harraway-Smith, M.D., Cherlynn June

## 2019-08-29 ENCOUNTER — Encounter: Payer: Self-pay | Admitting: Family Medicine

## 2019-08-31 NOTE — Telephone Encounter (Signed)
Patient called stating that she needs letter updated to included her Cat for emotional support.   Patient would like letter emailed to her @   [email or web address]

## 2019-09-01 NOTE — Telephone Encounter (Signed)
Pt called this morning to follow up on requested change to letter.  There are issues with pts' trying to log onto their Mychart accounts.  Pt tried to reach out to PCP that way, but was unsuccessful.  She needs this letter ASAP.  Please email it to her today as she will be charged more from her apartment complex if her cat is not mentioned in the letter.

## 2019-09-03 ENCOUNTER — Other Ambulatory Visit: Payer: Self-pay | Admitting: Family Medicine

## 2019-09-03 NOTE — Telephone Encounter (Signed)
I letter is in my chart------ I don't email to the patients -- it can be faxed

## 2019-09-12 ENCOUNTER — Other Ambulatory Visit: Payer: Self-pay | Admitting: Family Medicine

## 2019-09-12 DIAGNOSIS — S134XXA Sprain of ligaments of cervical spine, initial encounter: Secondary | ICD-10-CM

## 2019-09-18 ENCOUNTER — Encounter: Payer: Self-pay | Admitting: Family Medicine

## 2019-09-18 DIAGNOSIS — E785 Hyperlipidemia, unspecified: Secondary | ICD-10-CM

## 2019-09-18 DIAGNOSIS — F418 Other specified anxiety disorders: Secondary | ICD-10-CM

## 2019-09-19 MED ORDER — ARIPIPRAZOLE 2 MG PO TABS: 2.0000 mg | ORAL_TABLET | Freq: Every day | ORAL | 1 refills | Status: DC

## 2019-09-19 MED ORDER — FENOFIBRATE 160 MG PO TABS: 160.0000 mg | ORAL_TABLET | Freq: Every day | ORAL | 1 refills | Status: DC

## 2019-09-21 ENCOUNTER — Ambulatory Visit (INDEPENDENT_AMBULATORY_CARE_PROVIDER_SITE_OTHER): Payer: Medicare HMO | Admitting: Psychology

## 2019-09-21 DIAGNOSIS — F4321 Adjustment disorder with depressed mood: Secondary | ICD-10-CM | POA: Diagnosis not present

## 2019-09-22 ENCOUNTER — Ambulatory Visit: Payer: Medicare HMO | Admitting: Medical

## 2019-09-22 ENCOUNTER — Telehealth: Payer: Self-pay | Admitting: Family Medicine

## 2019-09-22 DIAGNOSIS — F418 Other specified anxiety disorders: Secondary | ICD-10-CM

## 2019-09-22 DIAGNOSIS — K219 Gastro-esophageal reflux disease without esophagitis: Secondary | ICD-10-CM

## 2019-09-22 DIAGNOSIS — I10 Essential (primary) hypertension: Secondary | ICD-10-CM

## 2019-09-22 DIAGNOSIS — F411 Generalized anxiety disorder: Secondary | ICD-10-CM

## 2019-09-22 DIAGNOSIS — S134XXA Sprain of ligaments of cervical spine, initial encounter: Secondary | ICD-10-CM

## 2019-09-22 MED ORDER — HYDROCHLOROTHIAZIDE 25 MG PO TABS: 25.0000 mg | ORAL_TABLET | Freq: Every day | ORAL | 0 refills | Status: DC

## 2019-09-22 MED ORDER — OMEPRAZOLE 40 MG PO CPDR: DELAYED_RELEASE_CAPSULE | ORAL | 1 refills | Status: DC

## 2019-09-22 MED ORDER — ARIPIPRAZOLE 2 MG PO TABS: 2.0000 mg | ORAL_TABLET | Freq: Every day | ORAL | 1 refills | Status: DC

## 2019-09-22 MED ORDER — CYCLOBENZAPRINE HCL 10 MG PO TABS: 10.0000 mg | ORAL_TABLET | Freq: Two times a day (BID) | ORAL | 0 refills | Status: DC | PRN

## 2019-09-22 MED ORDER — ESCITALOPRAM OXALATE 20 MG PO TABS: 20.0000 mg | ORAL_TABLET | Freq: Every day | ORAL | 1 refills | Status: DC

## 2019-09-22 MED ORDER — ALENDRONATE SODIUM 70 MG PO TABS: ORAL_TABLET | ORAL | 0 refills | Status: DC

## 2019-09-22 NOTE — Telephone Encounter (Signed)
Medication: ARIPiprazole (ABILIFY) 2 MG tablet [[id number]]   cyclobenzaprine (FLEXERIL) 10 MG tablet [[id number]  escitalopram (LEXAPRO) 20 MG tablet [[id number]]   hydrochlorothiazide (HYDRODIURIL) 25 MG tablet [[id number]]  omeprazole (PRILOSEC) 40 MG capsule [[id number]  alendronate (FOSAMAX) 70 MG tablet [[id number]]   Has the patient contacted their pharmacy? No. (If no, request that the patient contact the pharmacy for the refill.) (If yes, when and what did the pharmacy advise?)  Preferred Pharmacy (with phone number or street name): [facility] [address], , [place], [place] and the contact number is [phone] and fax number is [phone].    Agent: Please be advised that RX refills may take up to 3 business days. We ask that you follow-up with your pharmacy.

## 2019-09-22 NOTE — Telephone Encounter (Signed)
Refills sent

## 2019-09-24 DIAGNOSIS — Z20828 Contact with and (suspected) exposure to other viral communicable diseases: Secondary | ICD-10-CM | POA: Diagnosis not present

## 2019-10-02 NOTE — Telephone Encounter (Signed)
FYI

## 2019-10-02 NOTE — Telephone Encounter (Signed)
I would recommend she go to UC if pain cont

## 2019-10-03 DIAGNOSIS — K219 Gastro-esophageal reflux disease without esophagitis: Secondary | ICD-10-CM | POA: Diagnosis not present

## 2019-10-03 DIAGNOSIS — H2511 Age-related nuclear cataract, right eye: Secondary | ICD-10-CM | POA: Diagnosis not present

## 2019-10-04 MED ORDER — ROPINIROLE HCL 0.5 MG PO TABS: 0.5000 mg | ORAL_TABLET | Freq: Two times a day (BID) | ORAL | 1 refills | Status: DC

## 2019-10-05 ENCOUNTER — Other Ambulatory Visit: Payer: Self-pay

## 2019-10-05 ENCOUNTER — Encounter: Payer: Self-pay | Admitting: Ophthalmology

## 2019-10-06 ENCOUNTER — Other Ambulatory Visit
Admission: RE | Admit: 2019-10-06 | Discharge: 2019-10-06 | Disposition: A | Discharge status: Home / Self Care | Payer: Medicare HMO | Source: Ambulatory Visit | Attending: Ophthalmology | Admitting: Ophthalmology

## 2019-10-06 DIAGNOSIS — Z01812 Encounter for preprocedural laboratory examination: Secondary | ICD-10-CM | POA: Diagnosis not present

## 2019-10-06 DIAGNOSIS — Z20822 Contact with and (suspected) exposure to covid-19: Secondary | ICD-10-CM | POA: Diagnosis not present

## 2019-10-06 NOTE — Discharge Instructions (Signed)

## 2019-10-07 LAB — SARS CORONAVIRUS 2 (TAT 6-24 HRS): SARS Coronavirus 2: NEGATIVE

## 2019-10-10 ENCOUNTER — Ambulatory Visit: Payer: Medicare HMO | Admitting: Anesthesiology

## 2019-10-10 ENCOUNTER — Encounter: Payer: Self-pay | Admitting: Ophthalmology

## 2019-10-10 ENCOUNTER — Ambulatory Visit
Admission: RE | Admit: 2019-10-10 | Discharge: 2019-10-10 | Disposition: A | Discharge status: Home / Self Care | Payer: Medicare HMO | Attending: Ophthalmology | Admitting: Ophthalmology

## 2019-10-10 ENCOUNTER — Encounter
Admission: RE | Disposition: A | Discharge status: Home / Self Care | Payer: Self-pay | Source: Home / Self Care | Attending: Ophthalmology

## 2019-10-10 ENCOUNTER — Other Ambulatory Visit: Payer: Self-pay

## 2019-10-10 DIAGNOSIS — Z79899 Other long term (current) drug therapy: Secondary | ICD-10-CM | POA: Insufficient documentation

## 2019-10-10 DIAGNOSIS — Z87891 Personal history of nicotine dependence: Secondary | ICD-10-CM | POA: Diagnosis not present

## 2019-10-10 DIAGNOSIS — Z96653 Presence of artificial knee joint, bilateral: Secondary | ICD-10-CM | POA: Diagnosis not present

## 2019-10-10 DIAGNOSIS — F329 Major depressive disorder, single episode, unspecified: Secondary | ICD-10-CM | POA: Insufficient documentation

## 2019-10-10 DIAGNOSIS — M797 Fibromyalgia: Secondary | ICD-10-CM | POA: Insufficient documentation

## 2019-10-10 DIAGNOSIS — G4730 Sleep apnea, unspecified: Secondary | ICD-10-CM | POA: Diagnosis not present

## 2019-10-10 DIAGNOSIS — Z8616 Personal history of COVID-19: Secondary | ICD-10-CM | POA: Diagnosis not present

## 2019-10-10 DIAGNOSIS — Z7983 Long term (current) use of bisphosphonates: Secondary | ICD-10-CM | POA: Insufficient documentation

## 2019-10-10 DIAGNOSIS — Z89429 Acquired absence of other toe(s), unspecified side: Secondary | ICD-10-CM | POA: Diagnosis not present

## 2019-10-10 DIAGNOSIS — Z79890 Hormone replacement therapy (postmenopausal): Secondary | ICD-10-CM | POA: Insufficient documentation

## 2019-10-10 DIAGNOSIS — K219 Gastro-esophageal reflux disease without esophagitis: Secondary | ICD-10-CM | POA: Insufficient documentation

## 2019-10-10 DIAGNOSIS — H2511 Age-related nuclear cataract, right eye: Secondary | ICD-10-CM | POA: Diagnosis not present

## 2019-10-10 DIAGNOSIS — F419 Anxiety disorder, unspecified: Secondary | ICD-10-CM | POA: Insufficient documentation

## 2019-10-10 DIAGNOSIS — H25811 Combined forms of age-related cataract, right eye: Secondary | ICD-10-CM | POA: Diagnosis not present

## 2019-10-10 DIAGNOSIS — M810 Age-related osteoporosis without current pathological fracture: Secondary | ICD-10-CM | POA: Insufficient documentation

## 2019-10-10 HISTORY — PX: CATARACT EXTRACTION W/PHACO: SHX586

## 2019-10-10 SURGERY — PHACOEMULSIFICATION, CATARACT, WITH IOL INSERTION
Anesthesia: Monitor Anesthesia Care | Site: Eye | Laterality: Right

## 2019-10-10 MED ORDER — EPINEPHRINE PF 1 MG/ML IJ SOLN
INTRAOCULAR | Status: DC | PRN
  Administered 2019-10-10: 46 mL via OPHTHALMIC

## 2019-10-10 MED ORDER — TETRACAINE HCL 0.5 % OP SOLN
1.0000 [drp] | OPHTHALMIC | Status: DC | PRN
  Administered 2019-10-10 (×3): 1 [drp] via OPHTHALMIC

## 2019-10-10 MED ORDER — NA CHONDROIT SULF-NA HYALURON 40-17 MG/ML IO SOLN
INTRAOCULAR | Status: DC | PRN
  Administered 2019-10-10: 1 mL via INTRAOCULAR

## 2019-10-10 MED ORDER — PHENYLEPHRINE HCL 10 % OP SOLN
1.0000 [drp] | OPHTHALMIC | Status: DC | PRN
  Administered 2019-10-10 (×3): 1 [drp] via OPHTHALMIC

## 2019-10-10 MED ORDER — MIDAZOLAM HCL 2 MG/2ML IJ SOLN
INTRAMUSCULAR | Status: DC | PRN
  Administered 2019-10-10: 1.5 mg via INTRAVENOUS

## 2019-10-10 MED ORDER — LIDOCAINE HCL (PF) 2 % IJ SOLN
INTRAOCULAR | Status: DC | PRN
  Administered 2019-10-10: 2 mL

## 2019-10-10 MED ORDER — LACTATED RINGERS IV SOLN: INTRAVENOUS | Status: DC

## 2019-10-10 MED ORDER — CYCLOPENTOLATE HCL 2 % OP SOLN
1.0000 [drp] | OPHTHALMIC | Status: DC | PRN
  Administered 2019-10-10 (×3): 1 [drp] via OPHTHALMIC

## 2019-10-10 MED ORDER — POLYMYXIN B-TRIMETHOPRIM 10000-0.1 UNIT/ML-% OP SOLN
OPHTHALMIC | Status: DC | PRN
  Administered 2019-10-10: 1 [drp] via OPHTHALMIC

## 2019-10-10 MED ORDER — BRIMONIDINE TARTRATE-TIMOLOL 0.2-0.5 % OP SOLN
OPHTHALMIC | Status: DC | PRN
  Administered 2019-10-10: 1 [drp] via OPHTHALMIC

## 2019-10-10 MED ORDER — FENTANYL CITRATE (PF) 100 MCG/2ML IJ SOLN
INTRAMUSCULAR | Status: DC | PRN
Start: 2019-10-10 — End: 2019-10-10
  Administered 2019-10-10: 50 ug via INTRAVENOUS

## 2019-10-10 SURGICAL SUPPLY — 19 items
CANNULA ANT/CHMB 27G (MISCELLANEOUS) ×2 IMPLANT
CANNULA ANT/CHMB 27GA (MISCELLANEOUS) ×4 IMPLANT
GLOVE SURG LX 8.0 MICRO (GLOVE) ×1
GLOVE SURG LX STRL 8.0 MICRO (GLOVE) ×1 IMPLANT
GLOVE SURG TRIUMPH 8.0 PF LTX (GLOVE) ×2 IMPLANT
GOWN STRL REUS W/ TWL LRG LVL3 (GOWN DISPOSABLE) ×2 IMPLANT
GOWN STRL REUS W/TWL LRG LVL3 (GOWN DISPOSABLE) ×4
LENS IOL TECNIS EYHANCE 27.5 ×1 IMPLANT
MARKER SKIN DUAL TIP RULER LAB (MISCELLANEOUS) ×2 IMPLANT
NDL FILTER BLUNT 18X1 1/2 (NEEDLE) ×1 IMPLANT
NEEDLE FILTER BLUNT 18X 1/2SAF (NEEDLE) ×1
NEEDLE FILTER BLUNT 18X1 1/2 (NEEDLE) ×1 IMPLANT
PACK EYE AFTER SURG (MISCELLANEOUS) ×2 IMPLANT
PACK OPTHALMIC (MISCELLANEOUS) ×2 IMPLANT
PACK PORFILIO (MISCELLANEOUS) ×2 IMPLANT
SYR 3ML LL SCALE MARK (SYRINGE) ×2 IMPLANT
SYR TB 1ML LUER SLIP (SYRINGE) ×2 IMPLANT
WATER STERILE IRR 250ML POUR (IV SOLUTION) ×2 IMPLANT
WIPE NON LINTING 3.25X3.25 (MISCELLANEOUS) ×2 IMPLANT

## 2019-10-10 NOTE — Op Note (Signed)
PREOPERATIVE DIAGNOSIS:  Nuclear sclerotic cataract of the right eye.   POSTOPERATIVE DIAGNOSIS:  Cataract   OPERATIVE PROCEDURE:@   SURGEON:  Birder Robson, MD.   ANESTHESIA:  Anesthesiologist: Carlos American, MD CRNA: Mayme Genta, CRNA  1.      Managed anesthesia care. 2.      0.30ml of Shugarcaine was instilled in the eye following the paracentesis.   COMPLICATIONS:  None.   TECHNIQUE:   Stop and chop   DESCRIPTION OF PROCEDURE:  The patient was examined and consented in the preoperative holding area where the aforementioned topical anesthesia was applied to the right eye and then brought back to the Operating Room where the right eye was prepped and draped in the usual sterile ophthalmic fashion and a lid speculum was placed. A paracentesis was created with the side port blade and the anterior chamber was filled with viscoelastic. A near clear corneal incision was performed with the steel keratome. A continuous curvilinear capsulorrhexis was performed with a cystotome followed by the capsulorrhexis forceps. Hydrodissection and hydrodelineation were carried out with BSS on a blunt cannula. The lens was removed in a stop and chop  technique and the remaining cortical material was removed with the irrigation-aspiration handpiece. The capsular bag was inflated with viscoelastic and the Technis [id number]  lens was placed in the capsular bag without complication. The remaining viscoelastic was removed from the eye with the irrigation-aspiration handpiece. The wounds were hydrated. The anterior chamber was flushed with BSS and the eye was inflated to physiologic pressure. 0.19ml of Vigamox was placed in the anterior chamber. The wounds were found to be water tight. The eye was dressed with Combigan. The patient was given protective glasses to wear throughout the day and a shield with which to sleep tonight. The patient was also given drops with which to begin a drop regimen today and will  follow-up with me in one day. Implant Name Type Inv. Item Serial No. Manufacturer Lot No. LRB No. Used Action  LENS II EYHANCE 27.5 - [id number]  LENS II EYHANCE 27.5 [id number] JOHNSON   Right 1 Implanted   Procedure(s): CATARACT EXTRACTION PHACO AND INTRAOCULAR LENS PLACEMENT (IOC) RIGHT 3.37 00:26.8 (Right)  Electronically signed: Birder Robson 10/10/2019 9:47 AM

## 2019-10-10 NOTE — Anesthesia Procedure Notes (Signed)
Procedure Name: MAC Performed by: Krisa Blattner, CRNA Pre-anesthesia Checklist: Patient identified, Emergency Drugs available, Suction available, Timeout performed and Patient being monitored Patient Re-evaluated:Patient Re-evaluated prior to induction Oxygen Delivery Method: Nasal cannula Placement Confirmation: positive ETCO2       

## 2019-10-10 NOTE — Transfer of Care (Signed)
Immediate Anesthesia Transfer of Care Note  Patient: [name]  Procedure(s) Performed: CATARACT EXTRACTION PHACO AND INTRAOCULAR LENS PLACEMENT (IOC) RIGHT 3.37 00:26.8 (Right Eye)  Patient Location: PACU  Anesthesia Type: MAC  Level of Consciousness: awake, alert  and patient cooperative  Airway and Oxygen Therapy: Patient Spontanous Breathing and Patient connected to supplemental oxygen  Post-op Assessment: Post-op Vital signs reviewed, Patient's Cardiovascular Status Stable, Respiratory Function Stable, Patent Airway and No signs of Nausea or vomiting  Post-op Vital Signs: Reviewed and stable  Complications: No complications documented.
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
## 2019-10-10 NOTE — Anesthesia Postprocedure Evaluation (Signed)
Anesthesia Post Note  Patient: [name]  Procedure(s) Performed: CATARACT EXTRACTION PHACO AND INTRAOCULAR LENS PLACEMENT (IOC) RIGHT 3.37 00:26.8 (Right Eye)     Patient location during evaluation: PACU Anesthesia Type: MAC Level of consciousness: awake and alert Pain management: pain level controlled Vital Signs Assessment: post-procedure vital signs reviewed and stable Respiratory status: spontaneous breathing, nonlabored ventilation, respiratory function stable and patient connected to nasal cannula oxygen Cardiovascular status: stable and blood pressure returned to baseline Postop Assessment: no apparent nausea or vomiting Anesthetic complications: no   No complications documented.  Wanda Plump Ladan Vanderzanden
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
## 2019-10-11 ENCOUNTER — Encounter: Payer: Self-pay | Admitting: Ophthalmology

## 2019-11-01 ENCOUNTER — Other Ambulatory Visit: Payer: Self-pay | Admitting: *Deleted

## 2019-11-01 DIAGNOSIS — N898 Other specified noninflammatory disorders of vagina: Secondary | ICD-10-CM

## 2019-11-01 MED ORDER — PREMARIN 0.625 MG/GM VA CREA: TOPICAL_CREAM | VAGINAL | 0 refills | Status: DC

## 2019-11-01 MED ORDER — ROPINIROLE HCL 0.5 MG PO TABS
0.5000 mg | ORAL_TABLET | Freq: Two times a day (BID) | ORAL | 1 refills | Status: DC
Start: 2019-11-01 — End: 2020-08-12

## 2019-11-13 ENCOUNTER — Ambulatory Visit: Payer: Medicare HMO | Admitting: Psychology

## 2019-11-13 DIAGNOSIS — Z961 Presence of intraocular lens: Secondary | ICD-10-CM | POA: Diagnosis not present

## 2019-11-14 ENCOUNTER — Ambulatory Visit: Payer: Medicare HMO | Admitting: Psychology

## 2019-11-15 ENCOUNTER — Ambulatory Visit: Payer: Medicare HMO | Admitting: Psychology

## 2019-11-16 DIAGNOSIS — F451 Undifferentiated somatoform disorder: Secondary | ICD-10-CM | POA: Diagnosis not present

## 2019-11-16 DIAGNOSIS — F3181 Bipolar II disorder: Secondary | ICD-10-CM | POA: Diagnosis not present

## 2019-12-04 ENCOUNTER — Telehealth: Payer: Self-pay | Admitting: Pharmacist
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
## 2019-12-15 DIAGNOSIS — F3181 Bipolar II disorder: Secondary | ICD-10-CM | POA: Diagnosis not present

## 2019-12-15 DIAGNOSIS — F451 Undifferentiated somatoform disorder: Secondary | ICD-10-CM | POA: Diagnosis not present

## 2019-12-22 ENCOUNTER — Encounter: Payer: Self-pay | Admitting: Family Medicine

## 2019-12-22 ENCOUNTER — Other Ambulatory Visit: Payer: Self-pay

## 2019-12-22 ENCOUNTER — Ambulatory Visit (INDEPENDENT_AMBULATORY_CARE_PROVIDER_SITE_OTHER): Payer: Medicare HMO | Admitting: Family Medicine

## 2019-12-22 ENCOUNTER — Other Ambulatory Visit: Payer: Self-pay | Admitting: Family Medicine

## 2019-12-22 VITALS — BP 98/60 | HR 70 | Temp 98.1°F | Resp 18 | Ht <= 58 in | Wt 150.8 lb

## 2019-12-22 DIAGNOSIS — E785 Hyperlipidemia, unspecified: Secondary | ICD-10-CM

## 2019-12-22 DIAGNOSIS — F319 Bipolar disorder, unspecified: Secondary | ICD-10-CM | POA: Insufficient documentation

## 2019-12-22 DIAGNOSIS — F988 Other specified behavioral and emotional disorders with onset usually occurring in childhood and adolescence: Secondary | ICD-10-CM

## 2019-12-22 DIAGNOSIS — E781 Pure hyperglyceridemia: Secondary | ICD-10-CM | POA: Insufficient documentation

## 2019-12-22 DIAGNOSIS — G4700 Insomnia, unspecified: Secondary | ICD-10-CM

## 2019-12-22 DIAGNOSIS — F900 Attention-deficit hyperactivity disorder, predominantly inattentive type: Secondary | ICD-10-CM

## 2019-12-22 DIAGNOSIS — Z79899 Other long term (current) drug therapy: Secondary | ICD-10-CM | POA: Diagnosis not present

## 2019-12-22 LAB — CBC WITH DIFFERENTIAL/PLATELET
Basophils Absolute: 0.1 10*3/uL (ref 0.0–0.1)
Basophils Relative: 1 % (ref 0.0–3.0)
Eosinophils Absolute: 0.2 10*3/uL (ref 0.0–0.7)
Eosinophils Relative: 2.9 % (ref 0.0–5.0)
HCT: 41.2 % (ref 36.0–46.0)
Hemoglobin: 13.4 g/dL (ref 12.0–15.0)
Lymphocytes Relative: 34.4 % (ref 12.0–46.0)
Lymphs Abs: 2.9 10*3/uL (ref 0.7–4.0)
MCHC: 32.5 g/dL (ref 30.0–36.0)
MCV: 86.6 fl (ref 78.0–100.0)
Monocytes Absolute: 0.6 10*3/uL (ref 0.1–1.0)
Monocytes Relative: 7.6 % (ref 3.0–12.0)
Neutro Abs: 4.6 10*3/uL (ref 1.4–7.7)
Neutrophils Relative %: 54.1 % (ref 43.0–77.0)
Platelets: 401 10*3/uL — ABNORMAL HIGH (ref 150.0–400.0)
RBC: 4.75 Mil/uL (ref 3.87–5.11)
RDW: 13.2 % (ref 11.5–15.5)
WBC: 8.5 10*3/uL (ref 4.0–10.5)

## 2019-12-22 LAB — COMPREHENSIVE METABOLIC PANEL
ALT: 31 U/L (ref 0–35)
AST: 28 U/L (ref 0–37)
Albumin: 4.5 g/dL (ref 3.5–5.2)
Alkaline Phosphatase: 46 U/L (ref 39–117)
BUN: 26 mg/dL — ABNORMAL HIGH (ref 6–23)
CO2: 27 mEq/L (ref 19–32)
Calcium: 10.7 mg/dL — ABNORMAL HIGH (ref 8.4–10.5)
Chloride: 104 mEq/L (ref 96–112)
Creatinine, Ser: 1.47 mg/dL — ABNORMAL HIGH (ref 0.40–1.20)
GFR: 36.09 mL/min — ABNORMAL LOW (ref >60.00)
Glucose, Bld: 94 mg/dL (ref 70–99)
Potassium: 4.4 mEq/L (ref 3.5–5.1)
Sodium: 138 mEq/L (ref 135–145)
Total Bilirubin: 0.4 mg/dL (ref 0.2–1.2)
Total Protein: 7.2 g/dL (ref 6.0–8.3)

## 2019-12-22 LAB — LIPID PANEL
Cholesterol: 163 mg/dL (ref 0–200)
HDL: 50.1 mg/dL (ref >39.00)
LDL Cholesterol: 94 mg/dL (ref 0–99)
NonHDL: 113.29
Total CHOL/HDL Ratio: 3
Triglycerides: 97 mg/dL (ref 0.0–149.0)
VLDL: 19.4 mg/dL (ref 0.0–40.0)

## 2019-12-22 MED ORDER — AMPHETAMINE-DEXTROAMPHET ER 20 MG PO CP24: 20.0000 mg | ORAL_CAPSULE | ORAL | 0 refills | Status: DC

## 2019-12-22 MED ORDER — METHYLPHENIDATE HCL 10 MG PO TABS: 10.0000 mg | ORAL_TABLET | Freq: Two times a day (BID) | ORAL | 0 refills | Status: DC

## 2019-12-22 MED ORDER — ZOLPIDEM TARTRATE 5 MG PO TABS: 5.0000 mg | ORAL_TABLET | Freq: Every evening | ORAL | 0 refills | Status: DC | PRN
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
## 2019-12-22 NOTE — Assessment & Plan Note (Signed)
Pt referred to psych for further treatment
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
## 2019-12-22 NOTE — Assessment & Plan Note (Signed)
Tolerating statin, encouraged heart healthy diet, avoid trans fats, minimize simple carbs and saturated fats. Increase exercise as tolerated 

## 2019-12-22 NOTE — Assessment & Plan Note (Signed)
Will give 30 days of ritalin----  F/u psych  [facility] health assessment reviewed

## 2019-12-22 NOTE — Assessment & Plan Note (Signed)
Check labs 

## 2019-12-22 NOTE — Patient Instructions (Signed)
Bipolar 1 Disorder Bipolar 1 disorder is a mental health disorder in which a person has episodes of emotional highs, or mania, and may also have episodes of lows, or depression. Bipolar 1 disorder is different from other bipolar disorders in that it involves extreme episodes of mania (manic episodes). These episodes last at least one week or involve symptoms that are so severe that hospitalization is needed to keep the person safe. What are the causes? The cause of this condition is not known. What increases the risk? The following factors may make you more likely to develop this condition:  Having a family member with the disorder.  Having an imbalance of certain chemicals in the brain (neurotransmitters).  Experiencing stress, such as illness, financial problems, or a death.  Having certain conditions that affect the brain or spinal cord (neurologic conditions).  Having had a brain injury (trauma). What are the signs or symptoms? Symptoms of mania include:  Very high self-esteem or self-confidence.  Decreased need for sleep.  Unusual talkativeness. Speech may be very fast.  Racing thoughts with quick shifts between topics that may or may not be related (flight of ideas).  Ability to concentrate either greatly improved or decreased.  Increased purposeful activity, such as work, studies, or social activity.  Increased agitation. This could be pacing, squirming, fidgeting, or finger and toe tapping.  Impulsive behavior and poor judgment. This may result in high-risk activities that are sexual, financial, or physical. Symptoms of depression include:  Extreme degrees of sadness, uncontrollable crying, hopelessness, worthlessness, or numbness.  Sleep problems, such as insomnia, waking early, or sleeping too much.  No longer enjoying things you used to enjoy.  Isolation. You may often spend time alone.  Lack of energy or motivation, and moving more slowly than  normal.  Trouble making decisions.  Increased appetite or loss of appetite.  Thoughts of death, or wanting to harm yourself. Sometimes, you may have a mixed mood. This means having symptoms of mania and depression at the same time. Stress can often trigger these symptoms. How is this diagnosed? This condition may be diagnosed based on:  Emotional episodes.  Medical history.  Use of alcohol, drugs, and prescription medicines. Certain medical conditions and substances can cause symptoms that seem like bipolar disorder. This is called secondary bipolar disorder. Your health care provider may ask you to take a short test. This helps to understand your symptoms. You may also be asked to see a mental health specialist to follow up on this diagnosis and start treatment. How is this treated?     This condition is a long-term (chronic) illness. It is often managed with ongoing treatment rather than treatment only when symptoms occur. A combination of treatments is the main approach. Treatment may include:  Medicine. Medicine can be prescribed by a health care provider who specializes in treating mental health disorders (psychiatrist). Medicines called mood stabilizers are usually prescribed. If symptoms occur during treatment with a mood stabilizer, other medicines may be added.  Psychotherapy. Some forms of talk therapy, such as cognitive behavioral therapy (CBT) and family therapy, can help with learning to manage bipolar disorder.  Psychoeducation. This helps you and others understand how this disorder is managed. Include friends and family in educational sessions so they learn how best to support you.  Methods of managing your condition, such as journaling or relaxation exercises. Relaxation exercises include: ? Yoga. ? Meditation. ? Deep breathing.  Lifestyle changes, such as: ? Limiting alcohol and drug use. ?   Exercising regularly. ? Structuring when you go to bed and get  up. ? Eating a healthy diet.  Electroconvulsive therapy (ECT). This is a procedure in which electricity is applied to the brain through the scalp. It may be used in cases of severe bipolar disorder when medicine and psychotherapy work too slowly or do not work. Follow these instructions at home: Activity  Return to your normal activities as told by your health care provider.  Find activities that you enjoy, and make time to do them.  Exercise regularly as told by your health care provider. Lifestyle   Follow a set schedule for eating and sleeping.  Eat a healthy diet that includes fresh fruits and vegetables, whole grains, low-fat dairy, and lean meat.  Get at least 7-8 hours of sleep each night.  Avoid using products that contain nicotine or tobacco. If you want help quitting, ask your health care provider.  Do not use drugs. Alcohol use  Do not drink alcohol if: ? Your health care provider tells you not to drink. ? You are pregnant, may be pregnant, or are planning to become pregnant.  If you drink alcohol: ? Limit how much you use to:  0-1 drink a day for women.  0-2 drinks a day for men. ? Be aware of how much alcohol is in your drink. In the [place] one drink equals one 12 oz bottle of beer (355 mL), one 5 oz glass of wine (148 mL), or one 1 oz glass of hard liquor (44 mL). General instructions  Take over-the-counter and prescription medicines only as told by your health care provider. You may think about stopping your medicine, but it is very important to take all your medicine as prescribed.  Consider joining a support group. Your health care provider may be able to recommend one.  Talk with your family and friends about your treatment goals and how they can help.  Keep all follow-up visits as told by your health care provider. This is important. Where to find more information  National Alliance on Mental Illness: www.nami.org  National Institute of Mental  Health: www.nimh.nih.gov Contact a health care provider if:  Your symptoms get worse, or your loved ones tell you that your symptoms are getting worse.  You have uncomfortable side effects from your medicine.  You have trouble sleeping.  You have trouble doing daily activities.  You feel unsafe in your surroundings.  You are self-medicating with alcohol or drugs. Get help right away if:  You have new symptoms.  You have thoughts about harming yourself or others.  You are considering suicide. If you ever feel like you may hurt yourself or others, or have thoughts about taking your own life, get help right away. You can go to your nearest emergency department or call:  Your local emergency services (911 in the [place]  A suicide crisis helpline, such as the National Suicide Prevention Lifeline at 1-800-273-8255. This is open 24 hours a day. Summary  Bipolar 1 disorder is a lifelong mental health disorder in which a person has episodes of mania and depression.  This disorder is mainly treated with a combination of medicines, talk and behavioral therapies, and, often, electroconvulsive therapy (ECT).  Include friends and family in educational sessions so they know how best to support you.  Get help right away if you are considering suicide. This information is not intended to replace advice given to you by your health care provider. Make sure you discuss any questions you   have with your health care provider. Document Revised: 06/14/2018 Document Reviewed: 06/14/2018 Elsevier Patient Education  2020 [employer]  

## 2019-12-23 LAB — DRUG MONITORING, PANEL 8 WITH CONFIRMATION, URINE
6 Acetylmorphine: NEGATIVE ng/mL (ref <10)
Alcohol Metabolites: NEGATIVE ng/mL
Amphetamines: NEGATIVE ng/mL (ref <500)
Benzodiazepines: NEGATIVE ng/mL (ref <100)
Buprenorphine, Urine: NEGATIVE ng/mL (ref <5)
Cocaine Metabolite: NEGATIVE ng/mL (ref <150)
Creatinine: 30.3 mg/dL
MDMA: NEGATIVE ng/mL (ref <500)
Marijuana Metabolite: NEGATIVE ng/mL (ref <20)
Opiates: NEGATIVE ng/mL (ref <100)
Oxidant: NEGATIVE ug/mL
Oxycodone: NEGATIVE ng/mL (ref <100)
pH: 7.7 (ref 4.5–9.0)

## 2019-12-23 LAB — DM TEMPLATE

## 2020-01-09 ENCOUNTER — Other Ambulatory Visit: Payer: Self-pay | Admitting: Family Medicine

## 2020-01-09 DIAGNOSIS — N898 Other specified noninflammatory disorders of vagina: Secondary | ICD-10-CM

## 2020-01-09 DIAGNOSIS — I10 Essential (primary) hypertension: Secondary | ICD-10-CM

## 2020-01-09 DIAGNOSIS — S134XXA Sprain of ligaments of cervical spine, initial encounter: Secondary | ICD-10-CM

## 2020-01-09 NOTE — Telephone Encounter (Signed)
Requesting: Flexeril 10 mg  Last Visit: 12/22/2019 Last Refill: 09/22/2019

## 2020-01-23 ENCOUNTER — Encounter (INDEPENDENT_AMBULATORY_CARE_PROVIDER_SITE_OTHER): Payer: Self-pay | Admitting: Family Medicine

## 2020-01-23 ENCOUNTER — Other Ambulatory Visit: Payer: Self-pay

## 2020-01-23 ENCOUNTER — Ambulatory Visit (INDEPENDENT_AMBULATORY_CARE_PROVIDER_SITE_OTHER): Payer: Medicare HMO | Admitting: Family Medicine

## 2020-01-23 VITALS — BP 117/77 | HR 74 | Temp 98.0°F | Ht <= 58 in | Wt 148.0 lb

## 2020-01-23 DIAGNOSIS — E669 Obesity, unspecified: Secondary | ICD-10-CM

## 2020-01-23 DIAGNOSIS — R739 Hyperglycemia, unspecified: Secondary | ICD-10-CM

## 2020-01-23 DIAGNOSIS — I10 Essential (primary) hypertension: Secondary | ICD-10-CM

## 2020-01-23 DIAGNOSIS — Z1331 Encounter for screening for depression: Secondary | ICD-10-CM | POA: Diagnosis not present

## 2020-01-23 DIAGNOSIS — E7849 Other hyperlipidemia: Secondary | ICD-10-CM

## 2020-01-23 DIAGNOSIS — E559 Vitamin D deficiency, unspecified: Secondary | ICD-10-CM | POA: Diagnosis not present

## 2020-01-23 DIAGNOSIS — Z6834 Body mass index (BMI) 34.0-34.9, adult: Secondary | ICD-10-CM

## 2020-01-23 DIAGNOSIS — Z0289 Encounter for other administrative examinations: Secondary | ICD-10-CM

## 2020-01-23 DIAGNOSIS — R5383 Other fatigue: Secondary | ICD-10-CM

## 2020-01-23 DIAGNOSIS — E785 Hyperlipidemia, unspecified: Secondary | ICD-10-CM | POA: Diagnosis not present

## 2020-01-23 DIAGNOSIS — R0602 Shortness of breath: Secondary | ICD-10-CM | POA: Diagnosis not present

## 2020-01-23 DIAGNOSIS — E038 Other specified hypothyroidism: Secondary | ICD-10-CM

## 2020-01-23 DIAGNOSIS — R944 Abnormal results of kidney function studies: Secondary | ICD-10-CM

## 2020-01-23 DIAGNOSIS — R7303 Prediabetes: Secondary | ICD-10-CM | POA: Diagnosis not present

## 2020-01-23 DIAGNOSIS — E063 Autoimmune thyroiditis: Secondary | ICD-10-CM | POA: Diagnosis not present

## 2020-01-24 LAB — LIPID PANEL WITH LDL/HDL RATIO
Cholesterol, Total: 145 mg/dL (ref 100–199)
HDL: 54 mg/dL (ref >39)
LDL Chol Calc (NIH): 77 mg/dL (ref 0–99)
LDL/HDL Ratio: 1.4 ratio (ref 0.0–3.2)
Triglycerides: 72 mg/dL (ref 0–149)
VLDL Cholesterol Cal: 14 mg/dL (ref 5–40)

## 2020-01-24 LAB — VITAMIN D 25 HYDROXY (VIT D DEFICIENCY, FRACTURES): Vit D, 25-Hydroxy: 26.6 ng/mL — ABNORMAL LOW (ref 30.0–100.0)

## 2020-01-24 LAB — COMPREHENSIVE METABOLIC PANEL
ALT: 27 IU/L (ref 0–32)
AST: 26 IU/L (ref 0–40)
Albumin/Globulin Ratio: 1.9 (ref 1.2–2.2)
Albumin: 4.6 g/dL (ref 3.8–4.8)
Alkaline Phosphatase: 45 IU/L (ref 44–121)
BUN/Creatinine Ratio: 18 (ref 12–28)
BUN: 16 mg/dL (ref 8–27)
Bilirubin Total: 0.4 mg/dL (ref 0.0–1.2)
CO2: 24 mmol/L (ref 20–29)
Calcium: 9.6 mg/dL (ref 8.7–10.3)
Chloride: 105 mmol/L (ref 96–106)
Creatinine, Ser: 0.9 mg/dL (ref 0.57–1.00)
GFR calc Af Amer: 75 mL/min/{1.73_m2} (ref >59)
GFR calc non Af Amer: 65 mL/min/{1.73_m2} (ref >59)
Globulin, Total: 2.4 g/dL (ref 1.5–4.5)
Glucose: 94 mg/dL (ref 65–99)
Potassium: 4.5 mmol/L (ref 3.5–5.2)
Sodium: 141 mmol/L (ref 134–144)
Total Protein: 7 g/dL (ref 6.0–8.5)

## 2020-01-24 LAB — CBC WITH DIFFERENTIAL/PLATELET
Basophils Absolute: 0 10*3/uL (ref 0.0–0.2)
Basos: 1 %
EOS (ABSOLUTE): 0.2 10*3/uL (ref 0.0–0.4)
Eos: 3 %
Hematocrit: 42 % (ref 34.0–46.6)
Hemoglobin: 13.8 g/dL (ref 11.1–15.9)
Immature Grans (Abs): 0 10*3/uL (ref 0.0–0.1)
Immature Granulocytes: 0 %
Lymphocytes Absolute: 2.2 10*3/uL (ref 0.7–3.1)
Lymphs: 34 %
MCH: 28.2 pg (ref 26.6–33.0)
MCHC: 32.9 g/dL (ref 31.5–35.7)
MCV: 86 fL (ref 79–97)
Monocytes Absolute: 0.6 10*3/uL (ref 0.1–0.9)
Monocytes: 9 %
Neutrophils Absolute: 3.3 10*3/uL (ref 1.4–7.0)
Neutrophils: 53 %
Platelets: 405 10*3/uL (ref 150–450)
RBC: 4.9 x10E6/uL (ref 3.77–5.28)
RDW: 12.3 % (ref 11.7–15.4)
WBC: 6.3 10*3/uL (ref 3.4–10.8)

## 2020-01-24 LAB — HEMOGLOBIN A1C
Est. average glucose Bld gHb Est-mCnc: 123 mg/dL
Hgb A1c MFr Bld: 5.9 % — ABNORMAL HIGH (ref 4.8–5.6)

## 2020-01-24 LAB — INSULIN, RANDOM: INSULIN: 7.3 u[IU]/mL (ref 2.6–24.9)

## 2020-01-24 LAB — T3: T3, Total: 76 ng/dL (ref 71–180)

## 2020-01-25 NOTE — Progress Notes (Signed)
Chief Complaint:   [name] (MR# [id number]) is a 70 y.o. female who presents for evaluation and treatment of obesity and related comorbidities. Current BMI is Body mass index is 34.4 kg/m. [name] has been struggling with her weight for many years and has been unsuccessful in either losing weight, maintaining weight loss, or reaching her healthy weight goal.  [name] is currently in the action stage of change and ready to dedicate time achieving and maintaining a healthier weight. [name] is interested in becoming our patient and working on intensive lifestyle modifications including (but not limited to) diet and exercise for weight loss.  [name]'s habits were reviewed today and are as follows: her desired weight loss is 23 lbs, she started gaining weight at age 45, her heaviest weight ever was 162 pounds, she is a picky eater and doesn't like to eat healthier foods, she has significant food cravings issues, she is frequently drinking liquids with calories and she struggles with emotional eating.  Depression Screen [name]'s Food and Mood (modified PHQ-9) score was 6.  Depression screen PHQ 2/9 01/23/2020  Decreased Interest 1  Down, Depressed, Hopeless 0  PHQ - 2 Score 1  Altered sleeping 0  Tired, decreased energy 1  Change in appetite 0  Feeling bad or failure about yourself  2  Trouble concentrating 2  Moving slowly or fidgety/restless 0  Suicidal thoughts 0  PHQ-9 Score 6  Difficult doing work/chores Somewhat difficult  Some recent data might be hidden   Subjective:   1. Other fatigue [name] admits to daytime somnolence and admits to waking up still tired. Patent has a history of symptoms of daytime fatigue. [name] generally gets 6 or 7 hours of sleep per night, and states that she has generally restful sleep with Ambien. Snoring is not present. Apneic episodes are not present. Epworth Sleepiness Score is 5.  2. SOB (shortness of breath) on  exertion [name] notes increasing shortness of breath with exercising and seems to be worsening over time with weight gain. She notes getting out of breath sooner with activity than she used to. This has not gotten worse recently. [name] denies shortness of breath at rest or orthopnea.  3. Essential hypertension [name]'s blood pressure is well controlled on her medications. She is ready to work on diet and weight loss.  4. Other hyperlipidemia [name] is intolerant of statins. She is on fenofibrate, and she is ready to work on diet and weight loss.  5. Hyperglycemia [name] has a history of elevated glucose readings.  6. Vitamin D deficiency [name] is not on Vit D, and she has no recent labs. She notes fatigue, and she is at high risk of Vit d deficiency.  7. Decreased GFR [name] recently had a decreased GFR. She is working on increasing her water intake. She is followed by Dr. Etter Ramirez.  Assessment/Plan:   1. Other fatigue [name] does feel that her weight is causing her energy to be lower than it should be. Fatigue may be related to obesity, depression or many other causes. Labs will be ordered, and in the meanwhile, [name] will focus on self care including making healthy food choices, increasing physical activity and focusing on stress reduction.  - EKG 12-Lead - CBC with Differential/Platelet - T3  2. SOB (shortness of breath) on exertion [name] does feel that she gets out of breath more easily that she used to when she exercises. [name] shortness of breath appears to be obesity related and exercise  induced. She has agreed to work on weight loss and gradually increase exercise to treat her exercise induced shortness of breath. Will continue to monitor closely.  3. Essential hypertension [name] will start her Category 1 plan, and will work on healthy weight loss and exercise to improve blood pressure control. We will watch for signs of hypotension as she continues her  lifestyle modifications. We will check labs today.  - Comprehensive metabolic panel  4. Other hyperlipidemia Cardiovascular risk and specific lipid/LDL goals reviewed. We discussed several lifestyle modifications today. We will check labs today. [name] will start her Category 1 plan, and will  work on exercise and weight loss efforts. Orders and follow up as documented in patient record.   - Lipid Panel With LDL/HDL Ratio  5. Hyperglycemia Fasting labs will be obtained today, and results with be discussed with [name] in 2 weeks at her follow up visit. In the meanwhile [name] will start her Category 1 plan and will work on weight loss efforts.  - Hemoglobin A1c - Insulin, random  6. Vitamin D deficiency Low Vitamin D level contributes to fatigue and are associated with obesity, breast, and colon cancer. We will check labs today. [name] will follow-up for routine testing of Vitamin D, at least 2-3 times per year to avoid over-replacement.  - VITAMIN D 25 Hydroxy (Vit-D Deficiency, Fractures)  7. Decreased GFR We will recheck labs today. [name] will continue to follow up as directed.  - Comprehensive metabolic panel  8. Depression screening [name] had a positive depression screening. Depression is commonly associated with obesity and often results in emotional eating behaviors. We will monitor this closely and work on CBT to help improve the non-hunger eating patterns. Referral to Psychology may be required if no improvement is seen as she continues in our clinic.  9. Class 2 severe obesity with serious comorbidity and body mass index (BMI) of 38.0 to 38.9 in adult, unspecified obesity type (HCC) [name] is currently in the action stage of change and her goal is to continue with weight loss efforts. I recommend [name] begin the structured treatment plan as follows:  She has agreed to the Category 1 Plan.  Exercise goals: No exercise has been prescribed for now, while we  concentrate on nutritional changes.  Behavioral modification strategies: increasing lean protein intake, increasing water intake and meal planning and cooking strategies.  She was informed of the importance of frequent follow-up visits to maximize her success with intensive lifestyle modifications for her multiple health conditions. She was informed we would discuss her lab results at her next visit unless there is a critical issue that needs to be addressed sooner. [name] agreed to keep her next visit at the agreed upon time to discuss these results.  Objective:   Blood pressure 117/77, pulse 74, temperature 98 F (36.7 C), temperature source Oral, height 4\' 7"  (1.397 m), weight 148 lb (67.1 kg), SpO2 95 %. Body mass index is 34.4 kg/m.  EKG: Normal sinus rhythm, rate 67 BPM.  Indirect Calorimeter completed today shows a VO2 of 194 and a REE of 1347.  Her calculated basal metabolic rate is 0350 thus her basal metabolic rate is better than expected.  General: Cooperative, alert, well developed, in no acute distress. HEENT: Conjunctivae and lids unremarkable. Cardiovascular: Regular rhythm.  Lungs: Normal work of breathing. Neurologic: No focal deficits.   Lab Results  Component Value Date   CREATININE 0.90 01/23/2020   BUN 16 01/23/2020   NA 141 01/23/2020  K 4.5 01/23/2020   CL 105 01/23/2020   CO2 24 01/23/2020   Lab Results  Component Value Date   ALT 27 01/23/2020   AST 26 01/23/2020   ALKPHOS 45 01/23/2020   BILITOT 0.4 01/23/2020   Lab Results  Component Value Date   HGBA1C 5.9 (H) 01/23/2020   HGBA1C 5.8 04/10/2014   Lab Results  Component Value Date   INSULIN 7.3 01/23/2020   Lab Results  Component Value Date   TSH 2.11 09/30/2018   Lab Results  Component Value Date   CHOL 145 01/23/2020   HDL 54 01/23/2020   LDLCALC 77 01/23/2020   LDLDIRECT 131.0 10/01/2016   TRIG 72 01/23/2020   CHOLHDL 3 12/22/2019   Lab Results  Component Value Date   WBC  6.3 01/23/2020   HGB 13.8 01/23/2020   HCT 42.0 01/23/2020   MCV 86 01/23/2020   PLT 405 01/23/2020   No results found for: IRON, TIBC, FERRITIN Obesity Behavioral Intervention:   Approximately 15 minutes were spent on the discussion below.  ASK: We discussed the diagnosis of obesity with [name] today and Jaynia agreed to give [place] permission to discuss obesity behavioral modification therapy today.  ASSESS: Brinda has the diagnosis of obesity and her BMI today is 34.4. Taia is in the action stage of change.   ADVISE: Julian was educated on the multiple health risks of obesity as well as the benefit of weight loss to improve her health. She was advised of the need for long term treatment and the importance of lifestyle modifications to improve her current health and to decrease her risk of future health problems.  AGREE: Multiple dietary modification options and treatment options were discussed and Sanjida agreed to follow the recommendations documented in the above note.  ARRANGE: Alishah was educated on the importance of frequent visits to treat obesity as outlined per CMS and USPSTF guidelines and agreed to schedule her next follow up appointment today.  Attestation Statements:   Reviewed by clinician on day of visit: allergies, medications, problem list, medical history, surgical history, family history, social history, and previous encounter notes.   I, Trixie Dredge, am acting as transcriptionist for Dennard Nip, MD.  I have reviewed the above documentation for accuracy and completeness, and I agree with the above. -  [employer]

## 2020-01-29 ENCOUNTER — Encounter: Payer: Self-pay | Admitting: Family Medicine

## 2020-02-06 ENCOUNTER — Ambulatory Visit (INDEPENDENT_AMBULATORY_CARE_PROVIDER_SITE_OTHER): Payer: Medicare HMO | Admitting: Family Medicine

## 2020-02-06 DIAGNOSIS — K006 Disturbances in tooth eruption: Secondary | ICD-10-CM | POA: Diagnosis not present

## 2020-02-12 ENCOUNTER — Encounter: Payer: Self-pay | Admitting: Family Medicine

## 2020-02-13 NOTE — Telephone Encounter (Signed)
Hopefully the endocrinologist can help you with your thyroid issue If her ins will let her make her own appointment she can make it ---- or we can put a referral in -- she just needs to let us know  We will be thinking / praying for her son

## 2020-02-15 ENCOUNTER — Other Ambulatory Visit: Payer: Self-pay

## 2020-02-15 ENCOUNTER — Ambulatory Visit (INDEPENDENT_AMBULATORY_CARE_PROVIDER_SITE_OTHER): Payer: Medicare HMO | Admitting: Family Medicine

## 2020-02-15 ENCOUNTER — Encounter (INDEPENDENT_AMBULATORY_CARE_PROVIDER_SITE_OTHER): Payer: Self-pay | Admitting: Family Medicine

## 2020-02-15 VITALS — BP 129/70 | HR 76 | Temp 98.0°F | Ht <= 58 in | Wt 147.0 lb

## 2020-02-15 DIAGNOSIS — Z9189 Other specified personal risk factors, not elsewhere classified: Secondary | ICD-10-CM

## 2020-02-15 DIAGNOSIS — Z6834 Body mass index (BMI) 34.0-34.9, adult: Secondary | ICD-10-CM | POA: Diagnosis not present

## 2020-02-15 DIAGNOSIS — E7849 Other hyperlipidemia: Secondary | ICD-10-CM

## 2020-02-15 DIAGNOSIS — F39 Unspecified mood [affective] disorder: Secondary | ICD-10-CM | POA: Diagnosis not present

## 2020-02-15 DIAGNOSIS — F32A Depression, unspecified: Secondary | ICD-10-CM | POA: Insufficient documentation

## 2020-02-15 DIAGNOSIS — E66811 Obesity, class 1: Secondary | ICD-10-CM

## 2020-02-15 DIAGNOSIS — E559 Vitamin D deficiency, unspecified: Secondary | ICD-10-CM | POA: Diagnosis not present

## 2020-02-15 DIAGNOSIS — I10 Essential (primary) hypertension: Secondary | ICD-10-CM | POA: Insufficient documentation

## 2020-02-15 DIAGNOSIS — R7303 Prediabetes: Secondary | ICD-10-CM

## 2020-02-15 DIAGNOSIS — E669 Obesity, unspecified: Secondary | ICD-10-CM | POA: Diagnosis not present

## 2020-02-15 MED ORDER — VITAMIN D (ERGOCALCIFEROL) 1.25 MG (50000 UNIT) PO CAPS: 50000.0000 [IU] | ORAL_CAPSULE | ORAL | 0 refills | Status: DC

## 2020-02-16 DIAGNOSIS — Z961 Presence of intraocular lens: Secondary | ICD-10-CM | POA: Diagnosis not present
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
## 2020-02-19 ENCOUNTER — Ambulatory Visit (INDEPENDENT_AMBULATORY_CARE_PROVIDER_SITE_OTHER): Payer: Medicare HMO

## 2020-02-19 DIAGNOSIS — E785 Hyperlipidemia, unspecified: Secondary | ICD-10-CM | POA: Diagnosis not present

## 2020-02-19 DIAGNOSIS — R7303 Prediabetes: Secondary | ICD-10-CM

## 2020-02-19 DIAGNOSIS — I10 Essential (primary) hypertension: Secondary | ICD-10-CM | POA: Diagnosis not present

## 2020-02-19 DIAGNOSIS — F418 Other specified anxiety disorders: Secondary | ICD-10-CM

## 2020-02-19 NOTE — Patient Instructions (Addendum)
Visit Information  Goals Addressed            This Visit's Progress   . Achieve a Healthy Weight-Obesity       Timeframe:  Long-Range Goal Priority:  High Start Date:    02/19/20                         Expected End Date:    08/18/20                   Follow Up Date 05/18/20   - keep a food diary - manage portion size - set goal weight    Why is this important?    When you are ready to manage your weight, have a plan and have set a goal, it is time to take action.   Taking small steps to change how you eat and exercise is a good place to start.    Notes: Keep following with healthy weight management clinic    . Track and Manage My Blood Pressure-Hypertension       Timeframe:  Long-Range Goal Priority:  Medium Start Date:  02/19/20                           Expected End Date:   02/18/21                    Follow Up Date 06/10/20    - check blood pressure 3 times per week - write blood pressure results in a log or diary    Why is this important?    You won't feel high blood pressure, but it can still hurt your blood vessels.   High blood pressure can cause heart or kidney problems. It can also cause a stroke.   Making lifestyle changes like losing a little weight or eating less salt will help.   Checking your blood pressure at home and at different times of the day can help to control blood pressure.   If the doctor prescribes medicine remember to take it the way the doctor ordered.   Call the office if you cannot afford the medicine or if there are questions about it.     Notes: Try and check when feeling dizzy      Patient Care Plan: Asthma, HTN, HLD, Pre-DM, Depression/Anxiety    Problem Identified: [facility] AMB "PATIENT-SPECIFIC PROBLEM"     Long-Range Goal: Patient-Specific Goal   Start Date: 02/19/2020  Expected End Date: 08/18/2020  This Visit's Progress: On track  Priority: High  Note:   Current Barriers:  . Work schedule make scheduling medications  difficult.  Pharmacist Clinical Goal(s):  Marland Kitchen Over the next 180 days, patient will adhere to prescribed medication regimen as evidenced by fill dates through collaboration with PharmD and provider.   Interventions: . 1:1 collaboration with [name], [name], DO regarding development and update of comprehensive plan of care as evidenced by provider attestation and co-signature . Inter-disciplinary care team collaboration (see longitudinal plan of care) . Comprehensive medication review performed; medication list updated in electronic medical record  Asthma/Tobacco (Goal: control symptoms and prevent exacerbations) -controlled -Current treatment   Fluticasone 182mcg 2 puffs twice daily  Albuterol inhaler as needed -Medications previously tried: none noted  -Exacerbations requiring treatment in last 6 months: none -Patient denies consistent use of maintenance inhaler -Frequency of rescue inhaler use: prn, rarely has to use -Counseled on  Benefits of consistent maintenance inhaler use When to use rescue inhaler Differences between maintenance and rescue inhalers -Recommended to continue current medication   Hypertension (BP goal <140/90) -controlled -Current treatment: . HCTZ 25mg  daily -Medications previously tried: none noted -Current home readings: Does not monitor at home, reports her BP is "usually low" -Current dietary habits: just started weight management clinic -Current exercise habits: No exercise currently -Denies hypotensive/hypertensive symptoms -Educated on Daily salt intake goal < 2300 mg; Exercise goal of 150 minutes per week; Symptoms of hypotension and importance of maintaining adequate hydration; -Counseled to monitor BP at home periodically, document, and provide log at future appointments -Counseled on diet and exercise extensively Recommended to continue current medication  Hyperlipidemia: (LDL goal < 100) -controlled -Current treatment: . Fenofibrate  160mg  daily -Medications previously tried: statins (myalgias) -Current dietary patterns: No fast food, everything low carb, no sugar.  Rarely splurges and has a sweet treat like cake or low carb ice cream -Current exercise habits: None -Educated on Cholesterol goals;  Importance of limiting foods high in cholesterol; Exercise goal of 150 minutes per week; -Counseled on diet and exercise extensively Recommended to continue current medication  Pre-Diabetes w/ neuropathy (A1c goal < 5.7) -uncontrolled -Current medications: . Lyrica 50mg  qid prn -Medications previously tried: none -Current home glucose readings - patient does not monitor  -Denies hypoglycemic/hyperglycemic symptoms -Current meal patterns:  . No specific foods given, however she mentions all low carb and low sugar and does not eat sweets except occasionally . drinks: no sugary beverages -Current exercise: None -Educated onA1c and blood sugar goals; Exercise goal of 150 minutes per week; Benefits of weight loss; -Counseled to check feet daily and get yearly eye exams -Counseled on diet and exercise extensively Recommended to continue current medication Recommended to try and implement physical activity plan starting a few days per week and work up to 150 minutes per week  Depression/Anxiety (Goal: Minimize or control symptoms) -controlled -Current treatment: . Abilify 2mg  daily . Escitalopram 20mg  daily -Medications previously tried/failed: none noted -Connected with psychology for mental health support -Educated on Benefits of medication for symptom control -Recommended to continue current medication Assessed current symptom leve, patient reports stable mood overall.  She does report sominability to sleep.  Recommended she could try to switch the Abilify to morning dosing to see if this helps.  Patient unsure if this would fit in to her schedule.   Patient Goals/Self-Care Activities . Over the next 180 days,  patient will:  - take medications as prescribed focus on medication adherence by pill count target a minimum of 150 minutes of moderate intensity exercise weekly  Follow Up Plan: The care management team will reach out to the patient again over the next 180 days.       The patient verbalized understanding of instructions, educational materials, and care plan provided today and agreed to receive a mailed copy of patient instructions, educational materials, and care plan.   Telephone follow up appointment with pharmacy team member scheduled for: 6 months  Edythe Clarity, [facility]

## 2020-02-20 DIAGNOSIS — Z5181 Encounter for therapeutic drug level monitoring: Secondary | ICD-10-CM | POA: Diagnosis not present

## 2020-02-20 DIAGNOSIS — G894 Chronic pain syndrome: Secondary | ICD-10-CM | POA: Diagnosis not present

## 2020-02-20 DIAGNOSIS — R52 Pain, unspecified: Secondary | ICD-10-CM | POA: Diagnosis not present

## 2020-02-20 DIAGNOSIS — M25511 Pain in right shoulder: Secondary | ICD-10-CM | POA: Diagnosis not present

## 2020-02-20 DIAGNOSIS — M79644 Pain in right finger(s): Secondary | ICD-10-CM | POA: Diagnosis not present

## 2020-02-20 DIAGNOSIS — Z79899 Other long term (current) drug therapy: Secondary | ICD-10-CM | POA: Diagnosis not present

## 2020-02-20 NOTE — Progress Notes (Signed)
Chief Complaint:   OBESITY [name] is here to discuss her progress with her obesity treatment plan along with follow-up of her obesity related diagnoses.   Today's visit was #: 2 Starting weight: 148 lbs Starting date: 01/23/2020 Today's weight: 147 lbs Today's date: 02/15/2020 Total lbs lost to date: 1 lb Body mass index is 34.17 kg/m.  Total weight loss percentage to date: -0.68%  Interim History:  This is [name]'s first visit with me.  Her first appointment was with Dr. Leafy Ro.    [name] is here today to review her NEW Meal Plan and to discuss all recent labs done here and/ or done at outside facilities. This is patient's first follow up visit. Extended time was spent counseling [name] on all new disease processes that were discovered or that are worsening.   She does not have her meal plan.  She lost the papers and tells me that she has been on her "cabbage soup diet".  Plan:  Extensive education provided regarding meal plan.  Handouts given on that and prediabetes.  All questions answered.  Nutrition Plan: Category 1 Plan for 75% of the time. Activity: None at this time.  Assessment/Plan:    Meds ordered this encounter  Medications  . Vitamin D, Ergocalciferol, (DRISDOL) 1.25 MG (50000 UNIT) CAPS capsule    Sig: Take 1 capsule (50,000 Units total) by mouth every 7 (seven) days.    Dispense:  4 capsule    Refill:  0     1. Essential hypertension At goal, stable.  Discussed labs with patient today.  Medications: HCTZ 25 mg daily.   Plan:   - Explained to her that medication managment of chronic medical conditions such as blood pressure and cholesterol should continue with her PCP, but we will monitor treatment plan/ care closely as she loses weight.   - We will watch for signs of hypotension as she continues lifestyle modifications. We will continue to monitor closely alongside her PCP and/or Specialist.   - Regular follow up with PCP and  specialists was also encouraged.  - Avoid buying foods that are: processed, frozen, or prepackaged to avoid excess salt.   BP Readings from Last 3 Encounters:  02/15/20 129/70  01/23/20 117/77  12/22/19 98/60   Lab Results  Component Value Date   CREATININE 0.90 01/23/2020   2. Other hyperlipidemia Course: Discussed labs with patient today.  Lipid-lowering medications:  Fenofibrate 160 mg daily.   Plan:  Dietary changes: Increase soluble fiber, decrease simple carbohydrates, decrease saturated fat. Exercise changes: Moderate to vigorous-intensity aerobic activity 150 minutes per week or as tolerated. We will continue to monitor along with PCP/specialists as it pertains to her weight loss journey.  Lab Results  Component Value Date   CHOL 145 01/23/2020   HDL 54 01/23/2020   LDLCALC 77 01/23/2020   LDLDIRECT 131.0 10/01/2016   TRIG 72 01/23/2020   CHOLHDL 3 12/22/2019   Lab Results  Component Value Date   ALT 27 01/23/2020   AST 26 01/23/2020   ALKPHOS 45 01/23/2020   BILITOT 0.4 01/23/2020   The 10-year ASCVD risk score Latoya Bussing DC Jr., et al., 2013) is: 21.4%   Values used to calculate the score:     Age: 70 years     Sex: Female     Is Non-Hispanic African American: No     Diabetic: Yes     Tobacco smoker: No     Systolic Blood Pressure: 161  mmHg     Is BP treated: Yes     HDL Cholesterol: 54 mg/dL     Total Cholesterol: 145 mg/dL  3. Prediabetes New.  Discussed labs with patient today.  Goal is HgbA1c < 5.7.  Medication: None.   Plan: - I counseled patient on pathophysiology of the disease process of I.R. and Pre-DM.  Extensive education done with patient regarding this new condition.  - Handouts were provided.  - Stressed importance of dietary and lifestyle modifications resulting in weight loss as first line txmnt -  She will focus on protein-rich, low simple carbohydrate foods. We reviewed the importance of hydration, regular exercise for stress reduction, and  increase fiber and proteins -> essentially to follow meal plan and lose weight - handouts provided at pt's desire after education provided.  All concerns/ questions addressed.   - anticipatory guidance given.   - Recheck A1c and fasting insulin level in approximately 3 months from last check or as deemed fit.   Lab Results  Component Value Date   HGBA1C 5.9 (H) 01/23/2020   Lab Results  Component Value Date   INSULIN 7.3 01/23/2020   4. Vitamin D deficiency Worsening.  Discussed labs with patient today.  Current vitamin D is 26.6, tested on 01/23/2020. Optimal goal > 50 ng/dL.  She has a history of osteopenia.  Plan: Start to take prescription Vitamin D @50 ,000 IU every week as prescribed.  Follow-up for routine testing of Vitamin D, at least 2-3 times per year to avoid over-replacement.  - Start Vitamin D, Ergocalciferol, (DRISDOL) 1.25 MG (50000 UNIT) CAPS capsule; Take 1 capsule (50,000 Units total) by mouth every 7 (seven) days.  Dispense: 4 capsule; Refill: 0  5. Mood disorder ([place]), with emotional eating Denies concerns with emotional eating today.  Denies SI.  Medication: Abilify 2 mg daily and Lexapro 20 mg daily.  Plan: Stable. Medications provided by PCP or Psychiatry.  Continue medications per other providers.  Continue prudent nutritional plan and eventually add exercise.  6. At risk for diabetes mellitus - [name] was given extensive diabetes prevention education and counseling today of more than 26 minutes.  - Counseled patient on pathophysiology of disease and discussed various treatment options which always includes dietary and lifestyle modification as first line.   - Importance of healthy diet with very limited amounts of simple carbohydrates discussed with patient in addition to regular aerobic exercise to an eventual goal of 30 min 5d/week or more.  - Handouts provided at patient's desire and or told to go online at the American Diabetes Association website for further  information  7. Class 1 obesity with serious comorbidity and body mass index (BMI) of 34.0 to 34.9 in adult, unspecified obesity type  Course: [name] is currently in the action stage of change. As such, her goal is to continue with weight loss efforts.   Nutrition goals: She has agreed to the Category 1 Plan.   Exercise goals: No exercise has been prescribed at this time.  Behavioral modification strategies: increasing lean protein intake, decreasing simple carbohydrates, meal planning and cooking strategies, keeping healthy foods in the home and planning for success.  [name] has agreed to follow-up with our clinic in 2 weeks. She was informed of the importance of frequent follow-up visits to maximize her success with intensive lifestyle modifications for her multiple health conditions.   Objective:   Blood pressure 129/70, pulse 76, temperature 98 F (36.7 C), height 4\' 7"  (1.397 m), weight 147 lb (66.7  kg), SpO2 97 %. Body mass index is 34.17 kg/m.  General: Cooperative, alert, well developed, in no acute distress. HEENT: Conjunctivae and lids unremarkable. Cardiovascular: Regular rhythm.  Lungs: Normal work of breathing. Neurologic: No focal deficits.   Lab Results  Component Value Date   CREATININE 0.90 01/23/2020   BUN 16 01/23/2020   NA 141 01/23/2020   K 4.5 01/23/2020   CL 105 01/23/2020   CO2 24 01/23/2020   Lab Results  Component Value Date   ALT 27 01/23/2020   AST 26 01/23/2020   ALKPHOS 45 01/23/2020   BILITOT 0.4 01/23/2020   Lab Results  Component Value Date   HGBA1C 5.9 (H) 01/23/2020   HGBA1C 5.8 04/10/2014   Lab Results  Component Value Date   INSULIN 7.3 01/23/2020   Lab Results  Component Value Date   TSH 2.11 09/30/2018   Lab Results  Component Value Date   CHOL 145 01/23/2020   HDL 54 01/23/2020   LDLCALC 77 01/23/2020   LDLDIRECT 131.0 10/01/2016   TRIG 72 01/23/2020   CHOLHDL 3 12/22/2019   Lab Results  Component Value Date    WBC 6.3 01/23/2020   HGB 13.8 01/23/2020   HCT 42.0 01/23/2020   MCV 86 01/23/2020   PLT 405 01/23/2020   Attestation Statements:   Reviewed by clinician on day of visit: allergies, medications, problem list, medical history, surgical history, family history, social history, and previous encounter notes.  I, Water quality scientist, CMA, am acting as Location manager for [employer], DO.  I have reviewed the above documentation for accuracy and completeness, and I agree with the above. [name], D.O.  The [place] was signed into law in 2016 which includes the topic of electronic health records.  This provides immediate access to information in MyChart.  This includes consultation notes, operative notes, office notes, lab results and pathology reports.  If you have any questions about what you read please let us know at your next visit so we can discuss your concerns and take corrective action if need be.  We are right here with you.

## 2020-02-27 ENCOUNTER — Ambulatory Visit (INDEPENDENT_AMBULATORY_CARE_PROVIDER_SITE_OTHER): Payer: Medicare HMO | Admitting: Family Medicine

## 2020-02-27 ENCOUNTER — Encounter (INDEPENDENT_AMBULATORY_CARE_PROVIDER_SITE_OTHER): Payer: Self-pay | Admitting: Family Medicine

## 2020-02-27 ENCOUNTER — Other Ambulatory Visit: Payer: Self-pay

## 2020-02-27 VITALS — BP 131/77 | HR 69 | Temp 98.0°F | Ht <= 58 in | Wt 144.0 lb

## 2020-02-27 DIAGNOSIS — E063 Autoimmune thyroiditis: Secondary | ICD-10-CM

## 2020-02-27 DIAGNOSIS — R7303 Prediabetes: Secondary | ICD-10-CM | POA: Diagnosis not present

## 2020-02-27 DIAGNOSIS — E669 Obesity, unspecified: Secondary | ICD-10-CM

## 2020-02-27 DIAGNOSIS — Z6833 Body mass index (BMI) 33.0-33.9, adult: Secondary | ICD-10-CM | POA: Diagnosis not present

## 2020-02-27 MED ORDER — METFORMIN HCL 500 MG PO TABS: 500.0000 mg | ORAL_TABLET | Freq: Every day | ORAL | 0 refills | Status: DC

## 2020-02-28 NOTE — Progress Notes (Signed)
Chief Complaint:   OBESITY Carolynne is here to discuss her progress with her obesity treatment plan along with follow-up of her obesity related diagnoses. Avey is on the Category 1 Plan and states she is following her eating plan approximately 80% of the time. Alize states she is doing 0 minutes 0 times per week.  Today's visit was #: 3 Starting weight: 148 lbs Starting date: 01/23/2020 Today's weight: 144 lbs Today's date: 02/27/2020 Total lbs lost to date: 4 Total lbs lost since last in-office visit: 3  Interim History: Janit is unsure about the meal plan, and she has many questions. She notes some hunger. She has been working on limiting fat intake and using portion control. She has a good knowledge of nutrition principles. She feels her thyroid numbers are abnormal. She has a history of Hashimoto diagnosis.  Subjective:   1. Pre-diabetes Naylee's last A1c was 5.9. She notes a history of hypoglycemia episodes. She feels that limiting calories to 1,000 daily will cause polyphagia. She reports no family history of diabetes mellitus.   Lab Results  Component Value Date   HGBA1C 5.9 (H) 01/23/2020   Lab Results  Component Value Date   INSULIN 7.3 01/23/2020   2. Hashimoto's disease Gianah's thyroid peroxidase antibody was elevated on 08/19/2018 at 61. Her T3 was within normal limits at 76 on 01/22/2018. TSH within normal limits at 2.11 on 09/30/2019. I do not see that she has seen Endocrinology, but I saw a mention of a referral in Dr. Megan Mans notes.  Assessment/Plan:   1. Pre-diabetes Jamacia will continue to work on weight loss, exercise, increase protein, and decreasing simple carbohydrates to help decrease the risk of diabetes. Deanna agreed to start metformin 500 mg q AM with breakfast with no refills.  - metFORMIN (GLUCOPHAGE) 500 MG tablet; Take 1 tablet (500 mg total) by mouth daily with breakfast.  Dispense: 30 tablet; Refill: 0  2. Hashimoto's  disease Will make new referral to Endocrinology at her next office visit if she has not seen endo.   3. Class 1 obesity with serious comorbidity and body mass index (BMI) of 33.0 to 33.9 in adult, unspecified obesity type Mychele is currently in the action stage of change. As such, her goal is to continue with weight loss efforts. She has agreed to the Category 1 Plan or keeping a food journal and adhering to recommended goals of 1000-1100 calories and 75-80 grams of protein daily.   I asked Lawanna to work on meeting calorie and protein goals for each meal. A breakdown of calories and protein for meals was provided.   Handouts were given today: Protein Content of Foods, Breakfast and Lunch Options, and Recipes.  Exercise goals: No exercise has been prescribed at this time.  Behavioral modification strategies: increasing lean protein intake, decreasing simple carbohydrates, increasing vegetables, meal planning and cooking strategies and better snacking choices.  Monserat has agreed to follow-up with our clinic in 2 weeks.   Objective:   Blood pressure 131/77, pulse 69, temperature 98 F (36.7 C), height 4\' 7"  (1.397 m), weight 144 lb (65.3 kg), SpO2 98 %. Body mass index is 33.47 kg/m.  General: Cooperative, alert, well developed, in no acute distress. HEENT: Conjunctivae and lids unremarkable. Cardiovascular: Regular rhythm.  Lungs: Normal work of breathing. Neurologic: No focal deficits.   Lab Results  Component Value Date   CREATININE 0.90 01/23/2020   BUN 16 01/23/2020   NA 141 01/23/2020   K 4.5 01/23/2020  CL 105 01/23/2020   CO2 24 01/23/2020   Lab Results  Component Value Date   ALT 27 01/23/2020   AST 26 01/23/2020   ALKPHOS 45 01/23/2020   BILITOT 0.4 01/23/2020   Lab Results  Component Value Date   HGBA1C 5.9 (H) 01/23/2020   HGBA1C 5.8 04/10/2014   Lab Results  Component Value Date   INSULIN 7.3 01/23/2020   Lab Results  Component Value Date   TSH  2.11 09/30/2018   Lab Results  Component Value Date   CHOL 145 01/23/2020   HDL 54 01/23/2020   LDLCALC 77 01/23/2020   LDLDIRECT 131.0 10/01/2016   TRIG 72 01/23/2020   CHOLHDL 3 12/22/2019   Lab Results  Component Value Date   WBC 6.3 01/23/2020   HGB 13.8 01/23/2020   HCT 42.0 01/23/2020   MCV 86 01/23/2020   PLT 405 01/23/2020   No results found for: IRON, TIBC, FERRITIN  Obesity Behavioral Intervention:   Approximately 15 minutes were spent on the discussion below.  ASK: We discussed the diagnosis of obesity with [name] today and Laketia agreed to give [place] permission to discuss obesity behavioral modification therapy today.  ASSESS: Lenise has the diagnosis of obesity and her BMI today is 33.47. Elvera is in the action stage of change.   ADVISE: Emaan was educated on the multiple health risks of obesity as well as the benefit of weight loss to improve her health. She was advised of the need for long term treatment and the importance of lifestyle modifications to improve her current health and to decrease her risk of future health problems.  AGREE: Multiple dietary modification options and treatment options were discussed and Anijah agreed to follow the recommendations documented in the above note.  ARRANGE: Britlee was educated on the importance of frequent visits to treat obesity as outlined per CMS and USPSTF guidelines and agreed to schedule her next follow up appointment today.  Attestation Statements:   Reviewed by clinician on day of visit: allergies, medications, problem list, medical history, surgical history, family history, social history, and previous encounter notes.   [name], am acting as Location manager for [employer], FNP-C.  I have reviewed the above documentation for accuracy and completeness, and I agree with the above. -  [name], FNP

## 2020-02-29 ENCOUNTER — Encounter (INDEPENDENT_AMBULATORY_CARE_PROVIDER_SITE_OTHER): Payer: Self-pay | Admitting: Family Medicine

## 2020-03-03 ENCOUNTER — Other Ambulatory Visit: Payer: Self-pay | Admitting: Family Medicine

## 2020-03-03 DIAGNOSIS — G4700 Insomnia, unspecified: Secondary | ICD-10-CM

## 2020-03-04 NOTE — Telephone Encounter (Signed)
Requesting:Ambien  Contract: 12/22/19 UDS:12/22/19  Last Visit:02/19/20 Next Visit:08/20/20 Last Refill:09/22/19  Please Advise

## 2020-03-05 ENCOUNTER — Observation Stay
Admission: EM | Admit: 2020-03-05 | Discharge: 2020-03-07 | Disposition: A | Discharge status: Home / Self Care | Payer: Medicare HMO | Attending: Internal Medicine | Admitting: Internal Medicine

## 2020-03-05 ENCOUNTER — Other Ambulatory Visit: Payer: Self-pay

## 2020-03-05 DIAGNOSIS — I10 Essential (primary) hypertension: Secondary | ICD-10-CM | POA: Insufficient documentation

## 2020-03-05 DIAGNOSIS — R7303 Prediabetes: Secondary | ICD-10-CM | POA: Insufficient documentation

## 2020-03-05 DIAGNOSIS — Z23 Encounter for immunization: Secondary | ICD-10-CM | POA: Insufficient documentation

## 2020-03-05 DIAGNOSIS — M25532 Pain in left wrist: Secondary | ICD-10-CM | POA: Diagnosis present

## 2020-03-05 DIAGNOSIS — Z79899 Other long term (current) drug therapy: Secondary | ICD-10-CM | POA: Diagnosis not present

## 2020-03-05 DIAGNOSIS — L0390 Cellulitis, unspecified: Secondary | ICD-10-CM | POA: Diagnosis not present

## 2020-03-05 DIAGNOSIS — Z7984 Long term (current) use of oral hypoglycemic drugs: Secondary | ICD-10-CM | POA: Diagnosis not present

## 2020-03-05 DIAGNOSIS — L03114 Cellulitis of left upper limb: Principal | ICD-10-CM | POA: Insufficient documentation

## 2020-03-05 DIAGNOSIS — Z87891 Personal history of nicotine dependence: Secondary | ICD-10-CM | POA: Diagnosis not present

## 2020-03-05 DIAGNOSIS — Z96653 Presence of artificial knee joint, bilateral: Secondary | ICD-10-CM | POA: Diagnosis not present

## 2020-03-05 DIAGNOSIS — Z20822 Contact with and (suspected) exposure to covid-19: Secondary | ICD-10-CM | POA: Diagnosis not present

## 2020-03-05 DIAGNOSIS — A419 Sepsis, unspecified organism: Secondary | ICD-10-CM

## 2020-03-05 DIAGNOSIS — M79642 Pain in left hand: Secondary | ICD-10-CM | POA: Insufficient documentation

## 2020-03-05 DIAGNOSIS — E785 Hyperlipidemia, unspecified: Secondary | ICD-10-CM | POA: Diagnosis present

## 2020-03-05 LAB — CBC
HCT: 40.5 % (ref 36.0–46.0)
Hemoglobin: 13 g/dL (ref 12.0–15.0)
MCH: 27.9 pg (ref 26.0–34.0)
MCHC: 32.1 g/dL (ref 30.0–36.0)
MCV: 86.9 fL (ref 80.0–100.0)
Platelets: 360 10*3/uL (ref 150–400)
RBC: 4.66 MIL/uL (ref 3.87–5.11)
RDW: 13.4 % (ref 11.5–15.5)
WBC: 18.4 10*3/uL — ABNORMAL HIGH (ref 4.0–10.5)
nRBC: 0 % (ref 0.0–0.2)

## 2020-03-05 LAB — LACTIC ACID, PLASMA: Lactic Acid, Venous: 1 mmol/L (ref 0.5–1.9)

## 2020-03-05 LAB — BASIC METABOLIC PANEL
Anion gap: 8 (ref 5–15)
BUN: 23 mg/dL (ref 8–23)
CO2: 25 mmol/L (ref 22–32)
Calcium: 9.4 mg/dL (ref 8.9–10.3)
Chloride: 104 mmol/L (ref 98–111)
Creatinine, Ser: 0.81 mg/dL (ref 0.44–1.00)
GFR, Estimated: >60 mL/min (ref >60)
Glucose, Bld: 109 mg/dL — ABNORMAL HIGH (ref 70–99)
Potassium: 3.5 mmol/L (ref 3.5–5.1)
Sodium: 137 mmol/L (ref 135–145)

## 2020-03-05 LAB — SURGICAL PCR SCREEN
MRSA, PCR: NEGATIVE
Staphylococcus aureus: NEGATIVE

## 2020-03-05 MED ORDER — ZOLPIDEM TARTRATE 5 MG PO TABS
5.0000 mg | ORAL_TABLET | Freq: Every evening | ORAL | Status: DC | PRN
  Administered 2020-03-06: 5 mg via ORAL
  Filled 2020-03-05: qty 1

## 2020-03-05 MED ORDER — ACETAMINOPHEN 325 MG PO TABS: 650.0000 mg | ORAL_TABLET | Freq: Four times a day (QID) | ORAL | Status: DC | PRN

## 2020-03-05 MED ORDER — VANCOMYCIN HCL 1500 MG/300ML IV SOLN
1500.0000 mg | Freq: Once | INTRAVENOUS | Status: AC
  Administered 2020-03-05: 1500 mg via INTRAVENOUS
  Filled 2020-03-05: qty 300

## 2020-03-05 MED ORDER — METHYLPHENIDATE HCL 5 MG PO TABS
10.0000 mg | ORAL_TABLET | Freq: Two times a day (BID) | ORAL | Status: DC
  Administered 2020-03-06: 10 mg via ORAL
  Filled 2020-03-05 (×2): qty 2

## 2020-03-05 MED ORDER — CYCLOBENZAPRINE HCL 10 MG PO TABS
10.0000 mg | ORAL_TABLET | Freq: Two times a day (BID) | ORAL | Status: DC | PRN
  Administered 2020-03-05: 10 mg via ORAL
  Filled 2020-03-05: qty 1

## 2020-03-05 MED ORDER — ALBUTEROL SULFATE HFA 108 (90 BASE) MCG/ACT IN AERS
1.0000 | INHALATION_SPRAY | Freq: Four times a day (QID) | RESPIRATORY_TRACT | Status: DC | PRN
  Filled 2020-03-05: qty 6.7

## 2020-03-05 MED ORDER — PREGABALIN 50 MG PO CAPS
100.0000 mg | ORAL_CAPSULE | Freq: Two times a day (BID) | ORAL | Status: DC
  Administered 2020-03-06 – 2020-03-07 (×3): 100 mg via ORAL
  Filled 2020-03-05 (×3): qty 2

## 2020-03-05 MED ORDER — ARIPIPRAZOLE 2 MG PO TABS
2.0000 mg | ORAL_TABLET | Freq: Every day | ORAL | Status: DC
  Administered 2020-03-06 – 2020-03-07 (×2): 2 mg via ORAL
  Filled 2020-03-05 (×2): qty 1

## 2020-03-05 MED ORDER — ONDANSETRON HCL 4 MG/2ML IJ SOLN: 4.0000 mg | Freq: Four times a day (QID) | INTRAMUSCULAR | Status: DC | PRN

## 2020-03-05 MED ORDER — HYDROCODONE-ACETAMINOPHEN 5-325 MG PO TABS
1.0000 | ORAL_TABLET | Freq: Once | ORAL | Status: AC
  Administered 2020-03-05: 1 via ORAL
  Filled 2020-03-05: qty 1

## 2020-03-05 MED ORDER — TETANUS-DIPHTH-ACELL PERTUSSIS 5-2.5-18.5 LF-MCG/0.5 IM SUSY
0.5000 mL | PREFILLED_SYRINGE | Freq: Once | INTRAMUSCULAR | Status: AC
  Administered 2020-03-05: 0.5 mL via INTRAMUSCULAR
  Filled 2020-03-05: qty 0.5

## 2020-03-05 MED ORDER — FENOFIBRATE 160 MG PO TABS
160.0000 mg | ORAL_TABLET | Freq: Every day | ORAL | Status: DC
  Administered 2020-03-06 – 2020-03-07 (×2): 160 mg via ORAL
  Filled 2020-03-05 (×2): qty 1

## 2020-03-05 MED ORDER — ONDANSETRON HCL 4 MG PO TABS: 4.0000 mg | ORAL_TABLET | Freq: Four times a day (QID) | ORAL | Status: DC | PRN

## 2020-03-05 MED ORDER — ACETAMINOPHEN 650 MG RE SUPP: 650.0000 mg | Freq: Four times a day (QID) | RECTAL | Status: DC | PRN

## 2020-03-05 MED ORDER — HYDROCODONE-ACETAMINOPHEN 5-325 MG PO TABS
1.0000 | ORAL_TABLET | ORAL | Status: DC | PRN
  Administered 2020-03-05 – 2020-03-06 (×4): 2 via ORAL
  Filled 2020-03-05 (×2): qty 2
  Filled 2020-03-05: qty 1
  Filled 2020-03-05 (×2): qty 2

## 2020-03-05 MED ORDER — VANCOMYCIN HCL 750 MG/150ML IV SOLN
750.0000 mg | INTRAVENOUS | Status: DC
  Administered 2020-03-06: 750 mg via INTRAVENOUS
  Filled 2020-03-05 (×2): qty 150

## 2020-03-05 MED ORDER — ENOXAPARIN SODIUM 40 MG/0.4ML ~~LOC~~ SOLN
40.0000 mg | SUBCUTANEOUS | Status: DC
  Administered 2020-03-05 – 2020-03-06 (×2): 40 mg via SUBCUTANEOUS
  Filled 2020-03-05 (×2): qty 0.4

## 2020-03-05 MED ORDER — VALACYCLOVIR HCL 500 MG PO TABS
1000.0000 mg | ORAL_TABLET | Freq: Two times a day (BID) | ORAL | Status: DC
  Administered 2020-03-05 – 2020-03-07 (×4): 1000 mg via ORAL
  Filled 2020-03-05 (×5): qty 2

## 2020-03-05 MED ORDER — ROPINIROLE HCL 1 MG PO TABS
0.5000 mg | ORAL_TABLET | Freq: Two times a day (BID) | ORAL | Status: DC
  Administered 2020-03-05 – 2020-03-06 (×2): 0.5 mg via ORAL
  Filled 2020-03-05 (×2): qty 1

## 2020-03-05 MED ORDER — ESCITALOPRAM OXALATE 10 MG PO TABS
20.0000 mg | ORAL_TABLET | Freq: Every day | ORAL | Status: DC
  Administered 2020-03-05 – 2020-03-06 (×2): 20 mg via ORAL
  Filled 2020-03-05 (×3): qty 2

## 2020-03-05 NOTE — ED Notes (Signed)
Pt continues to inquire about the wait time. Pt states her hand is getting worse. Pt advised that we are just waiting on room for pt.

## 2020-03-05 NOTE — ED Notes (Signed)
Collected one set of Blood cultures

## 2020-03-05 NOTE — ED Triage Notes (Signed)
Pt comes with c/o left hand and arm pain and swelling. Pt states she was informed to come to ED for IV antibiotics.  Pt states redness going up her arm.

## 2020-03-05 NOTE — ED Notes (Signed)
Informed IT trainer of bed assignments.

## 2020-03-05 NOTE — Consult Note (Signed)
PHARMACY -  BRIEF ANTIBIOTIC NOTE   Pharmacy has received consult(s) for cellulitis from an ED provider.  The patient's profile has been reviewed for ht/wt/allergies/indication/available labs.    One time order(s) placed for vancomycin  Further antibiotics/pharmacy consults should be ordered by admitting physician if indicated.                       Thank you, Oswald Hillock 03/05/2020  7:39 PM
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
## 2020-03-05 NOTE — ED Notes (Signed)
[facility] called and stated pt was coming for cellulitis to left hand and needing IV antibiotics.

## 2020-03-05 NOTE — Consult Note (Signed)
Pharmacy Antibiotic Note  NACHELLE NEGRETTE is a 70 y.o. female admitted on 03/05/2020 with cellulitis.  Pharmacy has been consulted for vancomycin dosing.  Plan: Pt received vancomycin 1500 mg x 1 in the ED. Will order a maintenance dose of 750 mg q24 for a predicted AUC of 480. Goal AUC is 400-550. Plan to obtain levels in 4-5 days.      Temp (24hrs), Avg:98.4 F (36.9 C), Min:98.4 F (36.9 C), Max:98.4 F (36.9 C)  Recent Labs  Lab 03/05/20 1521 03/05/20 1903  WBC 18.4*  --   CREATININE 0.81  --   LATICACIDVEN  --  1.0    Estimated Creatinine Clearance: 47.4 mL/min (by C-G formula based on SCr of 0.81 mg/dL).    Allergies  Allergen Reactions  . Cefuroxime Axetil Anaphylaxis  . Oxycodone Itching       . Simvastatin     Severe pain everywhere, reaction to all statins  . Statins   . Sulfa Antibiotics Nausea Only  . Ciprofloxacin Palpitations  . Erythromycin Nausea And Vomiting  . Metaxalone Rash       . Sulfonamide Derivatives Hives         Antimicrobials this admission: 2/22 vancomycin >>   Dose adjustments this admission: None  Microbiology results: 2/22 BCx: pending  2/22 MRSA PCR: negative.   Thank you for allowing pharmacy to be a part of this patient's care.  Oswald Hillock 03/05/2020 9:13 PM

## 2020-03-05 NOTE — ED Provider Notes (Signed)
Admission discussed with Dr. Posey Pronto hospitalist   Delman Kitten, MD 03/05/20 [phone]

## 2020-03-06 ENCOUNTER — Observation Stay: Payer: Medicare HMO

## 2020-03-06 DIAGNOSIS — A419 Sepsis, unspecified organism: Secondary | ICD-10-CM | POA: Diagnosis not present

## 2020-03-06 DIAGNOSIS — S63592A Other specified sprain of left wrist, initial encounter: Secondary | ICD-10-CM | POA: Diagnosis not present

## 2020-03-06 DIAGNOSIS — S63512A Sprain of carpal joint of left wrist, initial encounter: Secondary | ICD-10-CM | POA: Diagnosis not present

## 2020-03-06 DIAGNOSIS — L0390 Cellulitis, unspecified: Secondary | ICD-10-CM | POA: Diagnosis not present

## 2020-03-06 DIAGNOSIS — M65842 Other synovitis and tenosynovitis, left hand: Secondary | ICD-10-CM | POA: Diagnosis not present

## 2020-03-06 DIAGNOSIS — M7989 Other specified soft tissue disorders: Secondary | ICD-10-CM | POA: Diagnosis not present

## 2020-03-06 LAB — CBC
HCT: 38.4 % (ref 36.0–46.0)
Hemoglobin: 12.4 g/dL (ref 12.0–15.0)
MCH: 28.3 pg (ref 26.0–34.0)
MCHC: 32.3 g/dL (ref 30.0–36.0)
MCV: 87.7 fL (ref 80.0–100.0)
Platelets: 332 10*3/uL (ref 150–400)
RBC: 4.38 MIL/uL (ref 3.87–5.11)
RDW: 13.5 % (ref 11.5–15.5)
WBC: 11.4 10*3/uL — ABNORMAL HIGH (ref 4.0–10.5)
nRBC: 0 % (ref 0.0–0.2)

## 2020-03-06 LAB — BASIC METABOLIC PANEL
Anion gap: 7 (ref 5–15)
BUN: 14 mg/dL (ref 8–23)
CO2: 28 mmol/L (ref 22–32)
Calcium: 10 mg/dL (ref 8.9–10.3)
Chloride: 104 mmol/L (ref 98–111)
Creatinine, Ser: 0.73 mg/dL (ref 0.44–1.00)
GFR, Estimated: >60 mL/min (ref >60)
Glucose, Bld: 101 mg/dL — ABNORMAL HIGH (ref 70–99)
Potassium: 3.6 mmol/L (ref 3.5–5.1)
Sodium: 139 mmol/L (ref 135–145)

## 2020-03-06 LAB — SARS CORONAVIRUS 2 (TAT 6-24 HRS): SARS Coronavirus 2: NEGATIVE

## 2020-03-06 LAB — HIV ANTIBODY (ROUTINE TESTING W REFLEX): HIV Screen 4th Generation wRfx: NONREACTIVE

## 2020-03-06 MED ORDER — HYDROCODONE-ACETAMINOPHEN 5-325 MG PO TABS: 1.0000 | ORAL_TABLET | Freq: Four times a day (QID) | ORAL | 0 refills | Status: DC | PRN

## 2020-03-06 MED ORDER — ONDANSETRON HCL 4 MG PO TABS: 4.0000 mg | ORAL_TABLET | Freq: Every day | ORAL | 1 refills | Status: DC | PRN

## 2020-03-06 MED ORDER — AMOXICILLIN-POT CLAVULANATE 875-125 MG PO TABS: 1.0000 | ORAL_TABLET | Freq: Two times a day (BID) | ORAL | 0 refills | Status: AC

## 2020-03-06 MED ORDER — ROPINIROLE HCL 1 MG PO TABS: 0.5000 mg | ORAL_TABLET | Freq: Every day | ORAL | Status: DC

## 2020-03-06 MED ORDER — SULFAMETHOXAZOLE-TRIMETHOPRIM 800-160 MG PO TABS: 1.0000 | ORAL_TABLET | Freq: Two times a day (BID) | ORAL | 0 refills | Status: AC

## 2020-03-06 MED ORDER — POLYETHYLENE GLYCOL 3350 17 G PO PACK
17.0000 g | PACK | Freq: Two times a day (BID) | ORAL | Status: DC
  Administered 2020-03-06 (×2): 17 g via ORAL
  Filled 2020-03-06 (×3): qty 1

## 2020-03-06 MED ORDER — ZOLPIDEM TARTRATE 5 MG PO TABS
5.0000 mg | ORAL_TABLET | Freq: Every evening | ORAL | Status: DC | PRN
  Administered 2020-03-06: 5 mg via ORAL
  Filled 2020-03-06: qty 1

## 2020-03-06 MED ORDER — GADOBUTROL 1 MMOL/ML IV SOLN
6.0000 mL | Freq: Once | INTRAVENOUS | Status: AC | PRN
  Administered 2020-03-06: 6 mL via INTRAVENOUS

## 2020-03-06 NOTE — Plan of Care (Addendum)
Pt arrived the unit from the ED, alert and oriented x4, on room air, afebrile. Pt oriented to the room. 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 Pt also reported that she took her home Lyrical prior to the scheduled dose. Medications collected and sent to the pharmacy. MRI of the L wrist performed. Covid negative. Falls precautions remained in place, call bell within reach Problem: Clinical Measurements: Goal: Ability to maintain clinical measurements within normal limits will improve Outcome: Progressing Goal: Will remain free from infection Outcome: Progressing   Problem: Safety: Goal: Ability to remain free from injury will improve Outcome: Progressing   Problem: Skin Integrity: Goal: Risk for impaired skin integrity will decrease Outcome: Progressing
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
## 2020-03-06 NOTE — Discharge Summary (Signed)
Physician Discharge Summary  [name] QJJ:[id number] DOB: [birth year] DOA: 03/05/2020  PCP: [name], DO  Admit date: 03/05/2020 Discharge date: 03/06/2020  Admitted From: home Disposition:  home  Recommendations for Outpatient Follow-up:  1. Follow up with PCP in 1-2 weeks 2. Please obtain BMP/CBC in one week 3. Please follow up in Ortho office [place]: No  Equipment/Devices: None    Discharge Condition: Stable  CODE STATUS: Full  Diet recommendation: Regular    Discharge Diagnoses: Principal Problem:   Sepsis due to cellulitis [facility]) Active Problems:   Hyperlipidemia   Essential hypertension    Summary of HPI and Hospital Course:  70 y.o. female with past medical history of hypertension, hyperlipidemia, rheumatoid arthritis, depression, ADD, IBS, GERD, esophageal stricture, sleep apnea who presented to the ED on 03/05/20 from [facility] office for evaluation of left wrist cellulitis. Patient had received massage and work was done on the left hand/wrist just the day before, had onset of new pain of the dorsal wrist and had shortly thereafter, with associated erythema which appeared to be extending proximally.  In the ED, x-rays were obtained and reassuring.  Vitals were normal.  Labs notable for WBC 18.4k.  Orthopedics recommended admission for IV antibiotics and close monitoring.   Patient being treated with empiric IV Vancomycin pending cultures.       Sepsis due to left hand/wrist cellulitis, POA: Presented with leukocytosis, tachycardia, and evidence of cellulitis/soft skin tissue infection at the left wrist/hand.  There is also concern for potential injury given history.  She was sent to the ED by orthopedics who recommended continuing IV antibiotics and formal consult in morning. --Treated with empiric IV vancomycin (reported severe cephalosporin allergy) --Blood cultures pending at time of d/c --MRI of the left wrist was reassuring,  reviewed by orthopedics and cleared for discharge on PO antibiotics and follow up in clinic tomorrow. -Tylenol and Norco PRN  Discharge antibiotics - 10 days Augmentin (not Keflex due to anaphylactic reaction to cephalosporins, pharmacy confirmed she's tolerated Augmentin) and Bactrim DS  Follow up TOMORROW in ortho clinic as scheduled.   Discharge Instructions   Discharge Instructions    Call MD for:  extreme fatigue   Complete by: As directed    Call MD for:  persistant dizziness or light-headedness   Complete by: As directed    Call MD for:  persistant nausea and vomiting   Complete by: As directed    Call MD for:  severe uncontrolled pain   Complete by: As directed    Call MD for:  temperature >100.4   Complete by: As directed    Diet - low sodium heart healthy   Complete by: As directed    Discharge instructions   Complete by: As directed    Take both antibiotics twice daily for 10 days, until gone.  Follow up with Tessa Lerner (Dr Cherylann Parr PA) in [place] office tomorrow.  Call to confirm appointment. [phone]  Use pain medication only as needed.  I sent prescription for Zofran, in case oral antibiotics cause any nausea.   Increase activity slowly   Complete by: As directed      Allergies as of 03/06/2020      Reactions   Cefuroxime Axetil Anaphylaxis   Oxycodone Itching       Simvastatin    Severe pain everywhere, reaction to all statins   Statins    Sulfa Antibiotics Nausea Only   Ciprofloxacin Palpitations  Erythromycin Nausea And Vomiting   Metaxalone Rash       Sulfonamide Derivatives Hives          Medication List    TAKE these medications   amoxicillin-clavulanate 875-125 MG tablet Commonly known as: Augmentin Take 1 tablet by mouth every 12 (twelve) hours for 10 days.   ARIPiprazole 2 MG tablet Commonly known as: Abilify Take 1 tablet (2 mg total) by mouth daily.   cholecalciferol 25 MCG (1000 UNIT) tablet Commonly known as:  VITAMIN D3 Take 2,000 Units by mouth daily.   cyclobenzaprine 10 MG tablet Commonly known as: FLEXERIL TAKE 1 TABLET  2  TIMES DAILY AS NEEDED FOR MUSCLE SPASMS.   escitalopram 20 MG tablet Commonly known as: LEXAPRO Take 1 tablet (20 mg total) by mouth daily. What changed: when to take this   fenofibrate 160 MG tablet Take 1 tablet (160 mg total) by mouth daily.   fluticasone 50 MCG/ACT nasal spray Commonly known as: FLONASE Place 2 sprays into both nostrils daily. What changed:   when to take this  reasons to take this   hydrochlorothiazide 25 MG tablet Commonly known as: HYDRODIURIL TAKE 1 TABLET EVERY DAY What changed: how much to take   HYDROcodone-acetaminophen 5-325 MG tablet Commonly known as: NORCO/VICODIN Take 1-2 tablets by mouth every 6 (six) hours as needed (1 tablet for moderate pain, 2 tablets for severe pain).   hyoscyamine 0.125 MG SL tablet Commonly known as: LEVSIN SL Place 1 tablet (0.125 mg total) under the tongue every 4 (four) hours as needed.   Lyrica 50 MG capsule Generic drug: pregabalin Take 100 mg by mouth 2 (two) times daily. Prescribed by pain clinic and pt usually takes 2 to 3 daily if needed   metFORMIN 500 MG tablet Commonly known as: GLUCOPHAGE Take 1 tablet (500 mg total) by mouth daily with breakfast.   methylphenidate 10 MG tablet Commonly known as: Ritalin Take 1 tablet (10 mg total) by mouth 2 (two) times daily.   multivitamin with minerals Tabs tablet Take 1 tablet by mouth daily.   omeprazole 40 MG capsule Commonly known as: PRILOSEC 1 po bid What changed:   how much to take  how to take this  when to take this   ondansetron 4 MG tablet Commonly known as: Zofran Take 1 tablet (4 mg total) by mouth daily as needed for nausea or vomiting.   rOPINIRole 0.5 MG tablet Commonly known as: REQUIP Take 1 tablet (0.5 mg total) by mouth 2 (two) times daily.   sulfamethoxazole-trimethoprim 800-160 MG tablet Commonly  known as: BACTRIM DS Take 1 tablet by mouth 2 (two) times daily for 10 days.   traMADol 100 MG 24 hr tablet Commonly known as: ULTRAM-ER Take 100 mg by mouth daily as needed for pain.   valACYclovir 1000 MG tablet Commonly known as: VALTREX Take 1 tablet (1,000 mg total) by mouth 2 (two) times daily.   Ventolin HFA 108 (90 Base) MCG/ACT inhaler Generic drug: albuterol INHALE 2 PUFFS INTO LUNGS EVERY 6 HOURS AS NEEDED FOR WHEEZING OR SHORTNESS OF BREATH   Vitamin D (Ergocalciferol) 1.25 MG (50000 UNIT) Caps capsule Commonly known as: DRISDOL Take 1 capsule (50,000 Units total) by mouth every 7 (seven) days.   zolpidem 5 MG tablet Commonly known as: AMBIEN TAKE 1 TABLET BY MOUTH AT BEDTIME AS NEEDED What changed: when to take this       Allergies  Allergen Reactions  . Cefuroxime Axetil Anaphylaxis  . Oxycodone  Itching       . Simvastatin     Severe pain everywhere, reaction to all statins  . Statins   . Sulfa Antibiotics Nausea Only  . Ciprofloxacin Palpitations  . Erythromycin Nausea And Vomiting  . Metaxalone Rash       . Sulfonamide Derivatives Hives          If you experience worsening of your admission symptoms, develop shortness of breath, life threatening emergency, suicidal or homicidal thoughts you must seek medical attention immediately by calling 911 or calling your MD immediately  if symptoms less severe.    Please note   You were cared for by a hospitalist during your hospital stay. If you have any questions about your discharge medications or the care you received while you were in the hospital after you are discharged, you can call the unit and asked to speak with the hospitalist on call if the hospitalist that took care of you is not available. Once you are discharged, your primary care physician will handle any further medical issues. Please note that NO REFILLS for any discharge medications will be authorized once you are discharged, as it is  imperative that you return to your primary care physician (or establish a relationship with a primary care physician if you do not have one) for your aftercare needs so that they can reassess your need for medications and monitor your lab values.   Consultations:  Orthopedics   Procedures/Studies: MR WRIST LEFT W WO CONTRAST  Result Date: 03/06/2020 CLINICAL DATA:  Left wrist pain, swelling, and redness beginning shortly after receiving a massage. EXAM: MR OF THE LEFT WRIST WITHOUT AND WITH CONTRAST TECHNIQUE: Multiplanar multisequence MR imaging of the left wrist was performed both before and after the administration of intravenous contrast. CONTRAST:  77mL GADAVIST GADOBUTROL 1 MMOL/ML IV SOLN COMPARISON:  Left wrist x-rays dated September 02, 2015. FINDINGS: Ligaments: Full-thickness tears of the central and dorsal scapholunate ligament components (series 3, image 16; series 6, images 6-7). Intact lunotriquetral ligament. Triangular fibrocartilage: Complete tear at the junction of the articular disc and ulnar styloid triangular ligament (series 6, image 8). The ulnar styloid triangular ligament also appears partially avulsed. Tendons: Intact flexor and extensor compartment tendons. Small amount of fluid in the second and third extensor tendon compartments with thin synovial enhancement. Moderate amount of fluid in the fourth extensor tendon compartment with thin synovial enhancement. Small amount of fluid within synovial enhancement in the deep common flexor tendon sheath as well as the flexor pollicis longus tendon sheath. Carpal tunnel/median nerve: Normal carpal tunnel. Normal median nerve. Guyon's canal: Normal. Joint/cartilage: Prominent radiocarpal and midcarpal synovitis. Scattered small carpal bone erosions. Mild radioscaphoid and scaphotrapeziotrapezoid degenerative changes. Bones/carpal alignment: No acute fracture or dislocation. No suspicious bone lesion. Other: Prominent dorsal wrist soft  tissue swelling with enhancement. IMPRESSION: 1. Dorsal wrist cellulitis.  No abscess. 2. Mild to moderate tenosynovitis of the second and third extensor tendon compartments, as well as the deep common flexor tendon sheath and flexor pollicis longus tendon. Radiocarpal and midcarpal synovitis with scattered carpal bone erosions. Constellation of findings is suggestive of inflammatory arthropathy such as rheumatoid arthritis. 3. Full-thickness tears of the central and dorsal scapholunate ligament components. 4. TFCC tear at the junction of the articular disc and ulnar styloid triangular ligament. The ulnar styloid triangular ligament also appears partially avulsed. Electronically Signed   By: Titus Dubin M.D.   On: 03/06/2020 08:29       Subjective: Pt  says feeling well, no fevers or chills.  Still having a lot of pain in the hand however.  Pain controlled with medication fairly well.  No other acute complaints.   Discharge Exam: Vitals:   03/06/20 0809 03/06/20 1124  BP: 118/61 (!) 112/57  Pulse: 74 80  Resp: 16 16  Temp: 98.5 F (36.9 C) 98.4 F (36.9 C)  SpO2: 97% 97%   Vitals:   03/06/20 0345 03/06/20 0445 03/06/20 0809 03/06/20 1124  BP: (!) 87/53 103/62 118/61 (!) 112/57  Pulse: 78 78 74 80  Resp: 16  16 16   Temp: 98.6 F (37 C)  98.5 F (36.9 C) 98.4 F (36.9 C)  TempSrc: Oral  Oral   SpO2: 92% 97% 97% 97%    General: Pt is alert, awake, not in acute distress Cardiovascular: RRR, S1/S2 +, no rubs, no gallops Respiratory: CTA bilaterally, no wheezing, no rhonchi Abdominal: Soft, NT, ND, bowel sounds + Extremities: dorsal aspect of left had and wrist is mildly erythematous with swelling and mild tenderness on palpation, no lower extremity edema, very limited ROM in left fingers and wrist, erythema does not extend to the proximal ink mark on forearm indicates erythema improving    The results of significant diagnostics from this hospitalization (including imaging,  microbiology, ancillary and laboratory) are listed below for reference.     Microbiology: Recent Results (from the past 240 hour(s))  Blood culture (routine x 2)     Status: None (Preliminary result)   Collection Time: 03/05/20  7:03 PM   Specimen: BLOOD  Result Value Ref Range Status   Specimen Description BLOOD BLOOD RIGHT FOREARM  Final   Special Requests   Final    BOTTLES DRAWN AEROBIC AND ANAEROBIC Blood Culture adequate volume   Culture   Final    NO GROWTH < 12 HOURS Performed at [facility], [address] [place], [place] [address]    Report Status PENDING  Incomplete  Surgical pcr screen     Status: None   Collection Time: 03/05/20  7:33 PM   Specimen: Nasal Mucosa; Nasal Swab  Result Value Ref Range Status   MRSA, PCR NEGATIVE NEGATIVE Final   Staphylococcus aureus NEGATIVE NEGATIVE Final    Comment: (NOTE) The Xpert SA Assay (FDA approved for NASAL specimens in patients 53 years of age and older), is one component of a comprehensive surveillance program. It is not intended to diagnose infection nor to guide or monitor treatment. Performed at [facility], [place], [place] [address]   SARS CORONAVIRUS 2 (TAT 6-24 HRS) Nasopharyngeal Nasopharyngeal Swab     Status: None   Collection Time: 03/05/20  7:33 PM   Specimen: Nasopharyngeal Swab  Result Value Ref Range Status   SARS Coronavirus 2 NEGATIVE NEGATIVE Final    Comment: (NOTE) SARS-CoV-2 target nucleic acids are NOT DETECTED.  The SARS-CoV-2 RNA is generally detectable in upper and lower respiratory specimens during the acute phase of infection. Negative results do not preclude SARS-CoV-2 infection, do not rule out co-infections with other pathogens, and should not be used as the sole basis for treatment or other patient management decisions. Negative results must be combined with clinical observations, patient history, and epidemiological information. The  expected result is Negative.  Fact Sheet for Patients: SugarRoll.be  Fact Sheet for Healthcare Providers: https://www.woods-mathews.com/  This test is not yet approved or cleared by the [place] FDA and  has been authorized for detection and/or diagnosis of SARS-CoV-2 by  FDA under an Emergency Use Authorization (EUA). This EUA will remain  in effect (meaning this test can be used) for the duration of the COVID-19 declaration under Se ction 564(b)(1) of the Act, 21 U.S.C. section 360bbb-3(b)(1), unless the authorization is terminated or revoked sooner.  Performed at [facility], [place] [address], [place], [place] [address]   Blood culture (routine x 2)     Status: None (Preliminary result)   Collection Time: 03/05/20  7:34 PM   Specimen: BLOOD  Result Value Ref Range Status   Specimen Description BLOOD BLOOD LEFT HAND  Final   Special Requests   Final    BOTTLES DRAWN AEROBIC AND ANAEROBIC Blood Culture adequate volume   Culture   Final    NO GROWTH < 12 HOURS Performed at [facility], [address] [place], [place] [address]    Report Status PENDING  Incomplete     Labs: BNP (last 3 results) No results for input(s): BNP in the last 8760 hours. Basic Metabolic Panel: Recent Labs  Lab 03/05/20 1521 03/06/20 0530  NA 137 139  K 3.5 3.6  CL 104 104  CO2 25 28  GLUCOSE 109* 101*  BUN 23 14  CREATININE 0.81 0.73  CALCIUM 9.4 10.0   Liver Function Tests: No results for input(s): AST, ALT, ALKPHOS, BILITOT, PROT, ALBUMIN in the last 168 hours. No results for input(s): LIPASE, AMYLASE in the last 168 hours. No results for input(s): AMMONIA in the last 168 hours. CBC: Recent Labs  Lab 03/05/20 1521 03/06/20 0530  WBC 18.4* 11.4*  HGB 13.0 12.4  HCT 40.5 38.4  MCV 86.9 87.7  PLT 360 332   Cardiac Enzymes: No results for input(s): CKTOTAL, CKMB, CKMBINDEX, TROPONINI in the last 168  hours. BNP: Invalid input(s): POCBNP CBG: No results for input(s): GLUCAP in the last 168 hours. D-Dimer No results for input(s): DDIMER in the last 72 hours. Hgb A1c No results for input(s): HGBA1C in the last 72 hours. Lipid Profile No results for input(s): CHOL, HDL, LDLCALC, TRIG, CHOLHDL, LDLDIRECT in the last 72 hours. Thyroid function studies No results for input(s): TSH, T4TOTAL, T3FREE, THYROIDAB in the last 72 hours.  Invalid input(s): FREET3 Anemia work up No results for input(s): VITAMINB12, FOLATE, FERRITIN, TIBC, IRON, RETICCTPCT in the last 72 hours. Urinalysis    Component Value Date/Time   COLORURINE YELLOW 08/04/2018 1608   APPEARANCEUR CLEAR 08/04/2018 1608   LABSPEC 1.010 08/04/2018 1608   PHURINE 7.0 08/04/2018 1608   GLUCOSEU NEGATIVE 08/04/2018 1608   HGBUR NEGATIVE 08/04/2018 1608   HGBUR negative 04/17/2009 0910   BILIRUBINUR negative 08/19/2018 1046   KETONESUR NEGATIVE 08/04/2018 1608   PROTEINUR Negative 08/19/2018 1046   PROTEINUR NEGATIVE 12/19/2012 1524   UROBILINOGEN 0.2 08/19/2018 1046   UROBILINOGEN 0.2 08/04/2018 1608   NITRITE negative 08/19/2018 1046   NITRITE NEGATIVE 08/04/2018 1608   LEUKOCYTESUR Negative 08/19/2018 1046   LEUKOCYTESUR NEGATIVE 08/04/2018 1608   Sepsis Labs Invalid input(s): PROCALCITONIN,  WBC,  LACTICIDVEN Microbiology Recent Results (from the past 240 hour(s))  Blood culture (routine x 2)     Status: None (Preliminary result)   Collection Time: 03/05/20  7:03 PM   Specimen: BLOOD  Result Value Ref Range Status   Specimen Description BLOOD BLOOD RIGHT FOREARM  Final   Special Requests   Final    BOTTLES DRAWN AEROBIC AND ANAEROBIC Blood Culture adequate volume   Culture   Final    NO GROWTH <  12 HOURS Performed at [facility], [address], [place], [place] [address]    Report Status PENDING  Incomplete  Surgical pcr screen     Status: None   Collection Time: 03/05/20  7:33 PM   Specimen:  Nasal Mucosa; Nasal Swab  Result Value Ref Range Status   MRSA, PCR NEGATIVE NEGATIVE Final   Staphylococcus aureus NEGATIVE NEGATIVE Final    Comment: (NOTE) The Xpert SA Assay (FDA approved for NASAL specimens in patients 41 years of age and older), is one component of a comprehensive surveillance program. It is not intended to diagnose infection nor to guide or monitor treatment. Performed at [facility], [place], [place] [address]   SARS CORONAVIRUS 2 (TAT 6-24 HRS) Nasopharyngeal Nasopharyngeal Swab     Status: None   Collection Time: 03/05/20  7:33 PM   Specimen: Nasopharyngeal Swab  Result Value Ref Range Status   SARS Coronavirus 2 NEGATIVE NEGATIVE Final    Comment: (NOTE) SARS-CoV-2 target nucleic acids are NOT DETECTED.  The SARS-CoV-2 RNA is generally detectable in upper and lower respiratory specimens during the acute phase of infection. Negative results do not preclude SARS-CoV-2 infection, do not rule out co-infections with other pathogens, and should not be used as the sole basis for treatment or other patient management decisions. Negative results must be combined with clinical observations, patient history, and epidemiological information. The expected result is Negative.  Fact Sheet for Patients: SugarRoll.be  Fact Sheet for Healthcare Providers: https://www.woods-mathews.com/  This test is not yet approved or cleared by the [place] FDA and  has been authorized for detection and/or diagnosis of SARS-CoV-2 by FDA under an Emergency Use Authorization (EUA). This EUA will remain  in effect (meaning this test can be used) for the duration of the COVID-19 declaration under Se ction 564(b)(1) of the Act, 21 U.S.C. section 360bbb-3(b)(1), unless the authorization is terminated or revoked sooner.  Performed at [facility], [place] [address] [place], [place] [address]   Blood culture  (routine x 2)     Status: None (Preliminary result)   Collection Time: 03/05/20  7:34 PM   Specimen: BLOOD  Result Value Ref Range Status   Specimen Description BLOOD BLOOD LEFT HAND  Final   Special Requests   Final    BOTTLES DRAWN AEROBIC AND ANAEROBIC Blood Culture adequate volume   Culture   Final    NO GROWTH < 12 HOURS Performed at [facility], [address] [place], [place] [address]    Report Status PENDING  Incomplete     Time coordinating discharge: Over 30 minutes  SIGNED:   Ezekiel Slocumb, DO [facility] 03/06/2020, 12:58 PM   If 7PM-7AM, please contact night-coverage [email or web address]

## 2020-03-06 NOTE — Hospital Course (Signed)
70 y.o. female with past medical history of hypertension, hyperlipidemia, rheumatoid arthritis, depression, ADD, IBS, GERD, esophageal stricture, sleep apnea who presented to the ED on 03/05/20 from [facility] office for evaluation of left wrist cellulitis. Patient had received massage and work was done on the left hand/wrist just the day before, had onset of new pain of the dorsal wrist and had shortly thereafter, with associated erythema which appeared to be extending proximally.  In the ED, x-rays were obtained and reassuring.  Vitals were normal.  Labs notable for WBC 18.4k.  Orthopedics recommended admission for IV antibiotics and close monitoring.   Patient being treated with empiric IV Vancomycin pending cultures.

## 2020-03-07 NOTE — Progress Notes (Signed)
Pt discharged home. Went over all discharge instructions and patient showed understanding. Took IV out of Right forearm without complications. Nurse picked up all home medications from pharmacy and gave back to patient. Pt wheeled down to medical mall entrance by volunteer services.

## 2020-03-10 LAB — CULTURE, BLOOD (ROUTINE X 2)
Culture: NO GROWTH
Culture: NO GROWTH
Special Requests: ADEQUATE
Special Requests: ADEQUATE

## 2020-03-12 ENCOUNTER — Encounter (INDEPENDENT_AMBULATORY_CARE_PROVIDER_SITE_OTHER): Payer: Self-pay | Admitting: Family Medicine

## 2020-03-12 ENCOUNTER — Other Ambulatory Visit: Payer: Self-pay

## 2020-03-12 ENCOUNTER — Ambulatory Visit (INDEPENDENT_AMBULATORY_CARE_PROVIDER_SITE_OTHER): Payer: Medicare HMO | Admitting: Family Medicine

## 2020-03-12 VITALS — BP 128/72 | HR 74 | Temp 98.5°F | Ht <= 58 in | Wt 144.0 lb

## 2020-03-12 DIAGNOSIS — R7303 Prediabetes: Secondary | ICD-10-CM

## 2020-03-12 DIAGNOSIS — E063 Autoimmune thyroiditis: Secondary | ICD-10-CM

## 2020-03-12 DIAGNOSIS — Z6833 Body mass index (BMI) 33.0-33.9, adult: Secondary | ICD-10-CM | POA: Diagnosis not present

## 2020-03-12 DIAGNOSIS — E669 Obesity, unspecified: Secondary | ICD-10-CM | POA: Diagnosis not present

## 2020-03-13 ENCOUNTER — Encounter (INDEPENDENT_AMBULATORY_CARE_PROVIDER_SITE_OTHER): Payer: Self-pay | Admitting: Family Medicine

## 2020-03-13 NOTE — Progress Notes (Signed)
Chief Complaint:   OBESITY [name] is here to discuss her progress with her obesity treatment plan along with follow-up of her obesity related diagnoses. [name] is on keeping a food journal and adhering to recommended goals of 1000-1100 calories and 75-80 g protein and states she is following her eating plan approximately 75% of the time. [name] states she is doing 0 minutes 0 times per week.  Today's visit was #: 4 Starting weight: 148 lbs Starting date: 01/23/2020 Today's weight: 144 lbs Today's date: 03/12/2020 Total lbs lost to date: 4 lbs Total lbs lost since last in-office visit: 0  Interim History: [name] is journaling in her head vs using app or writing it down. She averages 1400-1500 calories a day. She is not keeping track of protein but making sure all meals have protein. She was hospitalized last week for cellulitis in her left hand.  Subjective:   1. Pre-diabetes [name] notes hunger if she tries to keep calories less than 1400. She was started on Metformin at last OV. She still notes polyphagia.  Assessment/Plan:   1. Pre-diabetes [name] will continue to work on weight loss and decreasing simple carbohydrates to help decrease the risk of diabetes. Continue Metformin.  2. Class 1 obesity with serious comorbidity and body mass index (BMI) of 33.0 to 33.9 in adult, unspecified obesity type [name] is currently in the action stage of change. As such, her goal is to continue with weight loss efforts. She has agreed to keeping a food journal and adhering to recommended goals of 1000-1200 calories and 75-80 g protein.   [name] is encouraged to download My Fitness Pal.  Encouraged ehr to try to keep calories to 1200/day.  Exercise goals: No exercise has been prescribed at this time.  Behavioral modification strategies: increasing lean protein intake and planning for success.  [name] has agreed to follow-up with our clinic in 2 weeks.   Objective:   Blood  pressure 128/72, pulse 74, temperature 98.5 F (36.9 C), temperature source Oral, height 4\' 7"  (1.397 m), weight 144 lb (65.3 kg), SpO2 97 %. Body mass index is 33.47 kg/m.  General: Cooperative, alert, well developed, in no acute distress. HEENT: Conjunctivae and lids unremarkable. Cardiovascular: Regular rhythm.  Lungs: Normal work of breathing. Neurologic: No focal deficits.   Lab Results  Component Value Date   CREATININE 0.73 03/06/2020   BUN 14 03/06/2020   NA 139 03/06/2020   K 3.6 03/06/2020   CL 104 03/06/2020   CO2 28 03/06/2020   Lab Results  Component Value Date   ALT 27 01/23/2020   AST 26 01/23/2020   ALKPHOS 45 01/23/2020   BILITOT 0.4 01/23/2020   Lab Results  Component Value Date   HGBA1C 5.9 (H) 01/23/2020   HGBA1C 5.8 04/10/2014   Lab Results  Component Value Date   INSULIN 7.3 01/23/2020   Lab Results  Component Value Date   TSH 2.11 09/30/2018   Lab Results  Component Value Date   CHOL 145 01/23/2020   HDL 54 01/23/2020   LDLCALC 77 01/23/2020   LDLDIRECT 131.0 10/01/2016   TRIG 72 01/23/2020   CHOLHDL 3 12/22/2019   Lab Results  Component Value Date   WBC 11.4 (H) 03/06/2020   HGB 12.4 03/06/2020   HCT 38.4 03/06/2020   MCV 87.7 03/06/2020   PLT 332 03/06/2020   No results found for: IRON, TIBC, FERRITIN  Obesity Behavioral Intervention:   Approximately 15 minutes were spent on the discussion below.  ASK: We discussed the diagnosis of obesity with [name] today and [name] agreed to give [place] permission to discuss obesity behavioral modification therapy today.  ASSESS: [name] has the diagnosis of obesity and her BMI today is 33.5. [name] is in the action stage of change.   ADVISE: [name] was educated on the multiple health risks of obesity as well as the benefit of weight loss to improve her health. She was advised of the need for long term treatment and the importance of lifestyle modifications to improve her current health  and to decrease her risk of future health problems.  AGREE: Multiple dietary modification options and treatment options were discussed and [name] agreed to follow the recommendations documented in the above note.  ARRANGE: [name] was educated on the importance of frequent visits to treat obesity as outlined per CMS and USPSTF guidelines and agreed to schedule her next follow up appointment today.  Attestation Statements:   Reviewed by clinician on day of visit: allergies, medications, problem list, medical history, surgical history, family history, social history, and previous encounter notes.  Coral Ceo, am acting as Location manager for [employer], [place].  I have reviewed the above documentation for accuracy and completeness, and I agree with the above. -  [name], FNP

## 2020-03-14 DIAGNOSIS — L03119 Cellulitis of unspecified part of limb: Secondary | ICD-10-CM | POA: Diagnosis not present

## 2020-03-15 ENCOUNTER — Ambulatory Visit: Payer: Medicare HMO | Admitting: Family Medicine

## 2020-03-19 ENCOUNTER — Other Ambulatory Visit: Payer: Self-pay

## 2020-03-19 ENCOUNTER — Encounter: Payer: Self-pay | Admitting: Family Medicine

## 2020-03-19 ENCOUNTER — Ambulatory Visit (INDEPENDENT_AMBULATORY_CARE_PROVIDER_SITE_OTHER): Payer: Medicare HMO | Admitting: Family Medicine

## 2020-03-19 VITALS — BP 126/80 | HR 76 | Temp 98.6°F | Resp 18 | Wt 149.4 lb

## 2020-03-19 DIAGNOSIS — E785 Hyperlipidemia, unspecified: Secondary | ICD-10-CM

## 2020-03-19 DIAGNOSIS — E1169 Type 2 diabetes mellitus with other specified complication: Secondary | ICD-10-CM | POA: Insufficient documentation

## 2020-03-19 DIAGNOSIS — K219 Gastro-esophageal reflux disease without esophagitis: Secondary | ICD-10-CM | POA: Diagnosis not present

## 2020-03-19 DIAGNOSIS — M069 Rheumatoid arthritis, unspecified: Secondary | ICD-10-CM

## 2020-03-19 DIAGNOSIS — D352 Benign neoplasm of pituitary gland: Secondary | ICD-10-CM | POA: Insufficient documentation

## 2020-03-19 DIAGNOSIS — M0609 Rheumatoid arthritis without rheumatoid factor, multiple sites: Secondary | ICD-10-CM | POA: Diagnosis not present

## 2020-03-19 DIAGNOSIS — Z89429 Acquired absence of other toe(s), unspecified side: Secondary | ICD-10-CM | POA: Diagnosis not present

## 2020-03-19 DIAGNOSIS — E782 Mixed hyperlipidemia: Secondary | ICD-10-CM

## 2020-03-19 DIAGNOSIS — J441 Chronic obstructive pulmonary disease with (acute) exacerbation: Secondary | ICD-10-CM | POA: Diagnosis not present

## 2020-03-19 DIAGNOSIS — H538 Other visual disturbances: Secondary | ICD-10-CM

## 2020-03-19 NOTE — Patient Instructions (Signed)
Food Choices for Gastroesophageal Reflux Disease, Adult When you have gastroesophageal reflux disease (GERD), the foods you eat and your eating habits are very important. Choosing the right foods can help ease your discomfort. Think about working with a food expert (dietitian) to help you make good choices. What are tips for following this plan? Reading food labels  Look for foods that are low in saturated fat. Foods that may help with your symptoms include: ? Foods that have less than 5% of daily value (DV) of fat. ? Foods that have 0 grams of trans fat. Cooking  Do not fry your food.  Cook your food by baking, steaming, grilling, or broiling. These are all methods that do not need a lot of fat for cooking.  To add flavor, try to use herbs that are low in spice and acidity. Meal planning  Choose healthy foods that are low in fat, such as: ? Fruits and vegetables. ? Whole grains. ? Low-fat dairy products. ? Lean meats, fish, and poultry.  Eat small meals often instead of eating 3 large meals each day. Eat your meals slowly in a place where you are relaxed. Avoid bending over or lying down until 2-3 hours after eating.  Limit high-fat foods such as fatty meats or fried foods.  Limit your intake of fatty foods, such as oils, butter, and shortening.  Avoid the following as told by your doctor: ? Foods that cause symptoms. These may be different for different people. Keep a food diary to keep track of foods that cause symptoms. ? Alcohol. ? Drinking a lot of liquid with meals. ? Eating meals during the 2-3 hours before bed.   Lifestyle  Stay at a healthy weight. Ask your doctor what weight is healthy for you. If you need to lose weight, work with your doctor to do so safely.  Exercise for at least 30 minutes on 5 or more days each week, or as told by your doctor.  Wear loose-fitting clothes.  Do not smoke or use any products that contain nicotine or tobacco. If you need help  quitting, ask your doctor.  Sleep with the head of your bed higher than your feet. Use a wedge under the mattress or blocks under the bed frame to raise the head of the bed.  Chew sugar-free gum after meals. What foods should eat? Eat a healthy, well-balanced diet of fruits, vegetables, whole grains, low-fat dairy products, lean meats, fish, and poultry. Each person is different. Foods that may cause symptoms in one person may not cause any symptoms in another person. Work with your doctor to find foods that are safe for you. The items listed above may not be a complete list of what you can eat and drink. Contact a food expert for more options.   What foods should I avoid? Limiting some of these foods may help in managing the symptoms of GERD. Everyone is different. Talk with a food expert or your doctor to help you find the exact foods to avoid, if any. Fruits Any fruits prepared with added fat. Any fruits that cause symptoms. For some people, this may include citrus fruits, such as oranges, grapefruit, pineapple, and lemons. Vegetables Deep-fried vegetables. French fries. Any vegetables prepared with added fat. Any vegetables that cause symptoms. For some people, this may include tomatoes and tomato products, chili peppers, onions and garlic, and horseradish. Grains Pastries or quick breads with added fat. Meats and other proteins High-fat meats, such as fatty beef or pork,   hot dogs, ribs, ham, sausage, salami, and bacon. Fried meat or protein, including fried fish and fried chicken. Nuts and nut butters, in large amounts. Dairy Whole milk and chocolate milk. Sour cream. Cream. Ice cream. Cream cheese. Milkshakes. Fats and oils Butter. Margarine. Shortening. Ghee. Beverages Coffee and tea, with or without caffeine. Carbonated beverages. Sodas. Energy drinks. Fruit juice made with acidic fruits, such as orange or grapefruit. Tomato juice. Alcoholic drinks. Sweets and desserts Chocolate and  cocoa. Donuts. Seasonings and condiments Pepper. Peppermint and spearmint. Added salt. Any condiments, herbs, or seasonings that cause symptoms. For some people, this may include curry, hot sauce, or vinegar-based salad dressings. The items listed above may not be a complete list of what you should not eat and drink. Contact a food expert for more options. Questions to ask your doctor Diet and lifestyle changes are often the first steps that are taken to manage symptoms of GERD. If diet and lifestyle changes do not help, talk with your doctor about taking medicines. Where to find more information  International Foundation for Gastrointestinal Disorders: aboutgerd.org Summary  When you have GERD, food and lifestyle choices are very important in easing your symptoms.  Eat small meals often instead of 3 large meals a day. Eat your meals slowly and in a place where you are relaxed.  Avoid bending over or lying down until 2-3 hours after eating.  Limit high-fat foods such as fatty meats or fried foods. This information is not intended to replace advice given to you by your health care provider. Make sure you discuss any questions you have with your health care provider. Document Revised: 07/10/2019 Document Reviewed: 07/10/2019 Elsevier Patient Education  2021 Elsevier Inc.  
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
## 2020-03-19 NOTE — Assessment & Plan Note (Signed)
Encouraged heart healthy diet, increase exercise, avoid trans fats, consider a krill oil cap daily 

## 2020-03-19 NOTE — Assessment & Plan Note (Signed)
With hiatal hernia  Refer to GI

## 2020-03-19 NOTE — Assessment & Plan Note (Signed)
From dry eye per opth] Use lubricating drops bid ---ie systane

## 2020-04-01 ENCOUNTER — Encounter (INDEPENDENT_AMBULATORY_CARE_PROVIDER_SITE_OTHER): Payer: Self-pay | Admitting: Family Medicine

## 2020-04-01 ENCOUNTER — Ambulatory Visit (INDEPENDENT_AMBULATORY_CARE_PROVIDER_SITE_OTHER): Payer: Medicare HMO | Admitting: Family Medicine

## 2020-04-01 ENCOUNTER — Other Ambulatory Visit: Payer: Self-pay

## 2020-04-01 VITALS — BP 122/72 | HR 79 | Temp 98.0°F | Ht <= 58 in | Wt 146.0 lb

## 2020-04-01 DIAGNOSIS — Z6834 Body mass index (BMI) 34.0-34.9, adult: Secondary | ICD-10-CM | POA: Diagnosis not present

## 2020-04-01 DIAGNOSIS — R7303 Prediabetes: Secondary | ICD-10-CM | POA: Diagnosis not present

## 2020-04-01 DIAGNOSIS — E559 Vitamin D deficiency, unspecified: Secondary | ICD-10-CM | POA: Diagnosis not present

## 2020-04-01 DIAGNOSIS — E669 Obesity, unspecified: Secondary | ICD-10-CM | POA: Diagnosis not present

## 2020-04-01 MED ORDER — VITAMIN D (ERGOCALCIFEROL) 1.25 MG (50000 UNIT) PO CAPS: 50000.0000 [IU] | ORAL_CAPSULE | ORAL | 0 refills | Status: DC

## 2020-04-02 NOTE — Progress Notes (Signed)
Chief Complaint:   OBESITY [name] is here to discuss her progress with her obesity treatment plan along with follow-up of her obesity related diagnoses. Kambryn is on the Category 1 Plan and keeping a food journal and adhering to recommended goals of 1000-1200 calories and 75-80 protein and states she is following her eating plan approximately 75% of the time. Naydene states she is doing 0 minutes 0 times per week.  Today's visit was #: 5 Starting weight: 148 lbs Starting date: 01/23/2020 Today's weight: 146 lbs Today's date: 04/01/2020 Total lbs lost to date: 4 lbs Total lbs lost since last in-office visit: 0  Interim History: Nanci is not journaling but trying to keep calories low. She tries to keep portion sizes small. She does not feel her appetite is a problem. However she has told me she can;t keep calories at 1000-1100 due to hunger.  She has tried to increase her protein. She is up a few pounds of body water per bioimpedance.   Subjective:   1. Vitamin D deficiency Aldena's Vitamin D level was low at 26.6 on 01/23/2020. She is currently taking prescription vitamin D 50,000 IU each week.    Ref. Range 01/23/2020 10:35  Vitamin D, 25-Hydroxy Latest Ref Range: 30.0 - 100.0 ng/mL 26.6 (L)   2. Pre-diabetes Maybell stopped Metformin due to myalgias and muscle cramps. It did suppress her appetite.  Lab Results  Component Value Date   HGBA1C 5.9 (H) 01/23/2020   Lab Results  Component Value Date   INSULIN 7.3 01/23/2020    Assessment/Plan:   1. Vitamin D deficiency  She agrees to continue to take prescription Vitamin D @50 ,000 IU every week and will follow-up for routine testing of Vitamin D, at least 2-3 times per year to avoid over-replacement.  - Vitamin D, Ergocalciferol, (DRISDOL) 1.25 MG (50000 UNIT) CAPS capsule; Take 1 capsule (50,000 Units total) by mouth every 7 (seven) days.  Dispense: 4 capsule; Refill: 0  2. Pre-diabetes Jacolyn will continue to work  on weight loss, exercise, and decreasing simple carbohydrates to help decrease the risk of diabetes. Discontinue Metformin.  3. Class 1 obesity with serious comorbidity and body mass index (BMI) of 34.0 to 34.9 in adult, unspecified obesity type Aisha is currently in the action stage of change. As such, her goal is to continue with weight loss efforts. She has agreed to following a lower carbohydrate, vegetable and lean protein rich diet plan.   Handout: Low Carb Plan  Exercise goals: All adults should avoid inactivity. Some physical activity is better than none, and adults who participate in any amount of physical activity gain some health benefits.  Behavioral modification strategies: increasing lean protein intake and decreasing simple carbohydrates.  Yvonnie has agreed to follow-up with our clinic in 2-3 weeks.   Objective:   Blood pressure 122/72, pulse 79, temperature 98 F (36.7 C), height 4\' 7"  (1.397 m), weight 146 lb (66.2 kg), SpO2 97 %. Body mass index is 33.93 kg/m.  General: Cooperative, alert, well developed, in no acute distress. HEENT: Conjunctivae and lids unremarkable. Cardiovascular: Regular rhythm.  Lungs: Normal work of breathing. Neurologic: No focal deficits.   Lab Results  Component Value Date   CREATININE 0.73 03/06/2020   BUN 14 03/06/2020   NA 139 03/06/2020   K 3.6 03/06/2020   CL 104 03/06/2020   CO2 28 03/06/2020   Lab Results  Component Value Date   ALT 27 01/23/2020   AST 26 01/23/2020  ALKPHOS 45 01/23/2020   BILITOT 0.4 01/23/2020   Lab Results  Component Value Date   HGBA1C 5.9 (H) 01/23/2020   HGBA1C 5.8 04/10/2014   Lab Results  Component Value Date   INSULIN 7.3 01/23/2020   Lab Results  Component Value Date   TSH 2.11 09/30/2018   Lab Results  Component Value Date   CHOL 145 01/23/2020   HDL 54 01/23/2020   LDLCALC 77 01/23/2020   LDLDIRECT 131.0 10/01/2016   TRIG 72 01/23/2020   CHOLHDL 3 12/22/2019   Lab  Results  Component Value Date   WBC 11.4 (H) 03/06/2020   HGB 12.4 03/06/2020   HCT 38.4 03/06/2020   MCV 87.7 03/06/2020   PLT 332 03/06/2020   No results found for: IRON, TIBC, FERRITIN  Obesity Behavioral Intervention:   Approximately 15 minutes were spent on the discussion below.  ASK: We discussed the diagnosis of obesity with [name] today and Deauna agreed to give [place] permission to discuss obesity behavioral modification therapy today.  ASSESS: Emmogene has the diagnosis of obesity and her BMI today is 34.1. Angelene is in the action stage of change.   ADVISE: Jariah was educated on the multiple health risks of obesity as well as the benefit of weight loss to improve her health. She was advised of the need for long term treatment and the importance of lifestyle modifications to improve her current health and to decrease her risk of future health problems.  AGREE: Multiple dietary modification options and treatment options were discussed and Shalisa agreed to follow the recommendations documented in the above note.  ARRANGE: Tabytha was educated on the importance of frequent visits to treat obesity as outlined per CMS and USPSTF guidelines and agreed to schedule her next follow up appointment today.  Attestation Statements:   Reviewed by clinician on day of visit: allergies, medications, problem list, medical history, surgical history, family history, social history, and previous encounter notes.  Coral Ceo, am acting as Location manager for [employer], [place].  I have reviewed the above documentation for accuracy and completeness, and I agree with the above. -  [name], FNP

## 2020-04-03 ENCOUNTER — Encounter (INDEPENDENT_AMBULATORY_CARE_PROVIDER_SITE_OTHER): Payer: Self-pay | Admitting: Family Medicine

## 2020-04-04 ENCOUNTER — Other Ambulatory Visit (INDEPENDENT_AMBULATORY_CARE_PROVIDER_SITE_OTHER): Payer: Self-pay | Admitting: Family Medicine

## 2020-04-04 DIAGNOSIS — R7303 Prediabetes: Secondary | ICD-10-CM

## 2020-04-04 NOTE — Telephone Encounter (Signed)
Last seen by Dawn  

## 2020-04-17 ENCOUNTER — Other Ambulatory Visit: Payer: Self-pay | Admitting: Family Medicine

## 2020-04-17 ENCOUNTER — Encounter: Payer: Self-pay | Admitting: Family Medicine

## 2020-04-17 DIAGNOSIS — F411 Generalized anxiety disorder: Secondary | ICD-10-CM

## 2020-04-17 DIAGNOSIS — F988 Other specified behavioral and emotional disorders with onset usually occurring in childhood and adolescence: Secondary | ICD-10-CM

## 2020-04-17 DIAGNOSIS — I10 Essential (primary) hypertension: Secondary | ICD-10-CM

## 2020-04-17 DIAGNOSIS — S134XXA Sprain of ligaments of cervical spine, initial encounter: Secondary | ICD-10-CM

## 2020-04-18 ENCOUNTER — Other Ambulatory Visit: Payer: Self-pay | Admitting: Family Medicine

## 2020-04-18 DIAGNOSIS — E785 Hyperlipidemia, unspecified: Secondary | ICD-10-CM

## 2020-04-18 MED ORDER — FENOFIBRATE 160 MG PO TABS: 160.0000 mg | ORAL_TABLET | Freq: Every day | ORAL | 1 refills | Status: DC

## 2020-04-18 MED ORDER — METHYLPHENIDATE HCL 10 MG PO TABS: 10.0000 mg | ORAL_TABLET | Freq: Two times a day (BID) | ORAL | 0 refills | Status: DC

## 2020-04-18 NOTE — Telephone Encounter (Signed)
done

## 2020-04-18 NOTE — Telephone Encounter (Signed)
Is it okay to send in script? If so what is the sig

## 2020-04-18 NOTE — Telephone Encounter (Signed)
Requesting: ritalin  Contract:12/2019 UDS:12/2019 Last Visit:03/2020 Next Visit:n/a Last Refill:12/2019  Please Advise

## 2020-04-22 IMAGING — DX CHEST - 2 VIEW
2 series · 2 of 2 positions shown · non-contrast
Comparison: 01/22/2016

CLINICAL DATA: Fever.

EXAM:
CHEST - 2 VIEW

[chest pa]
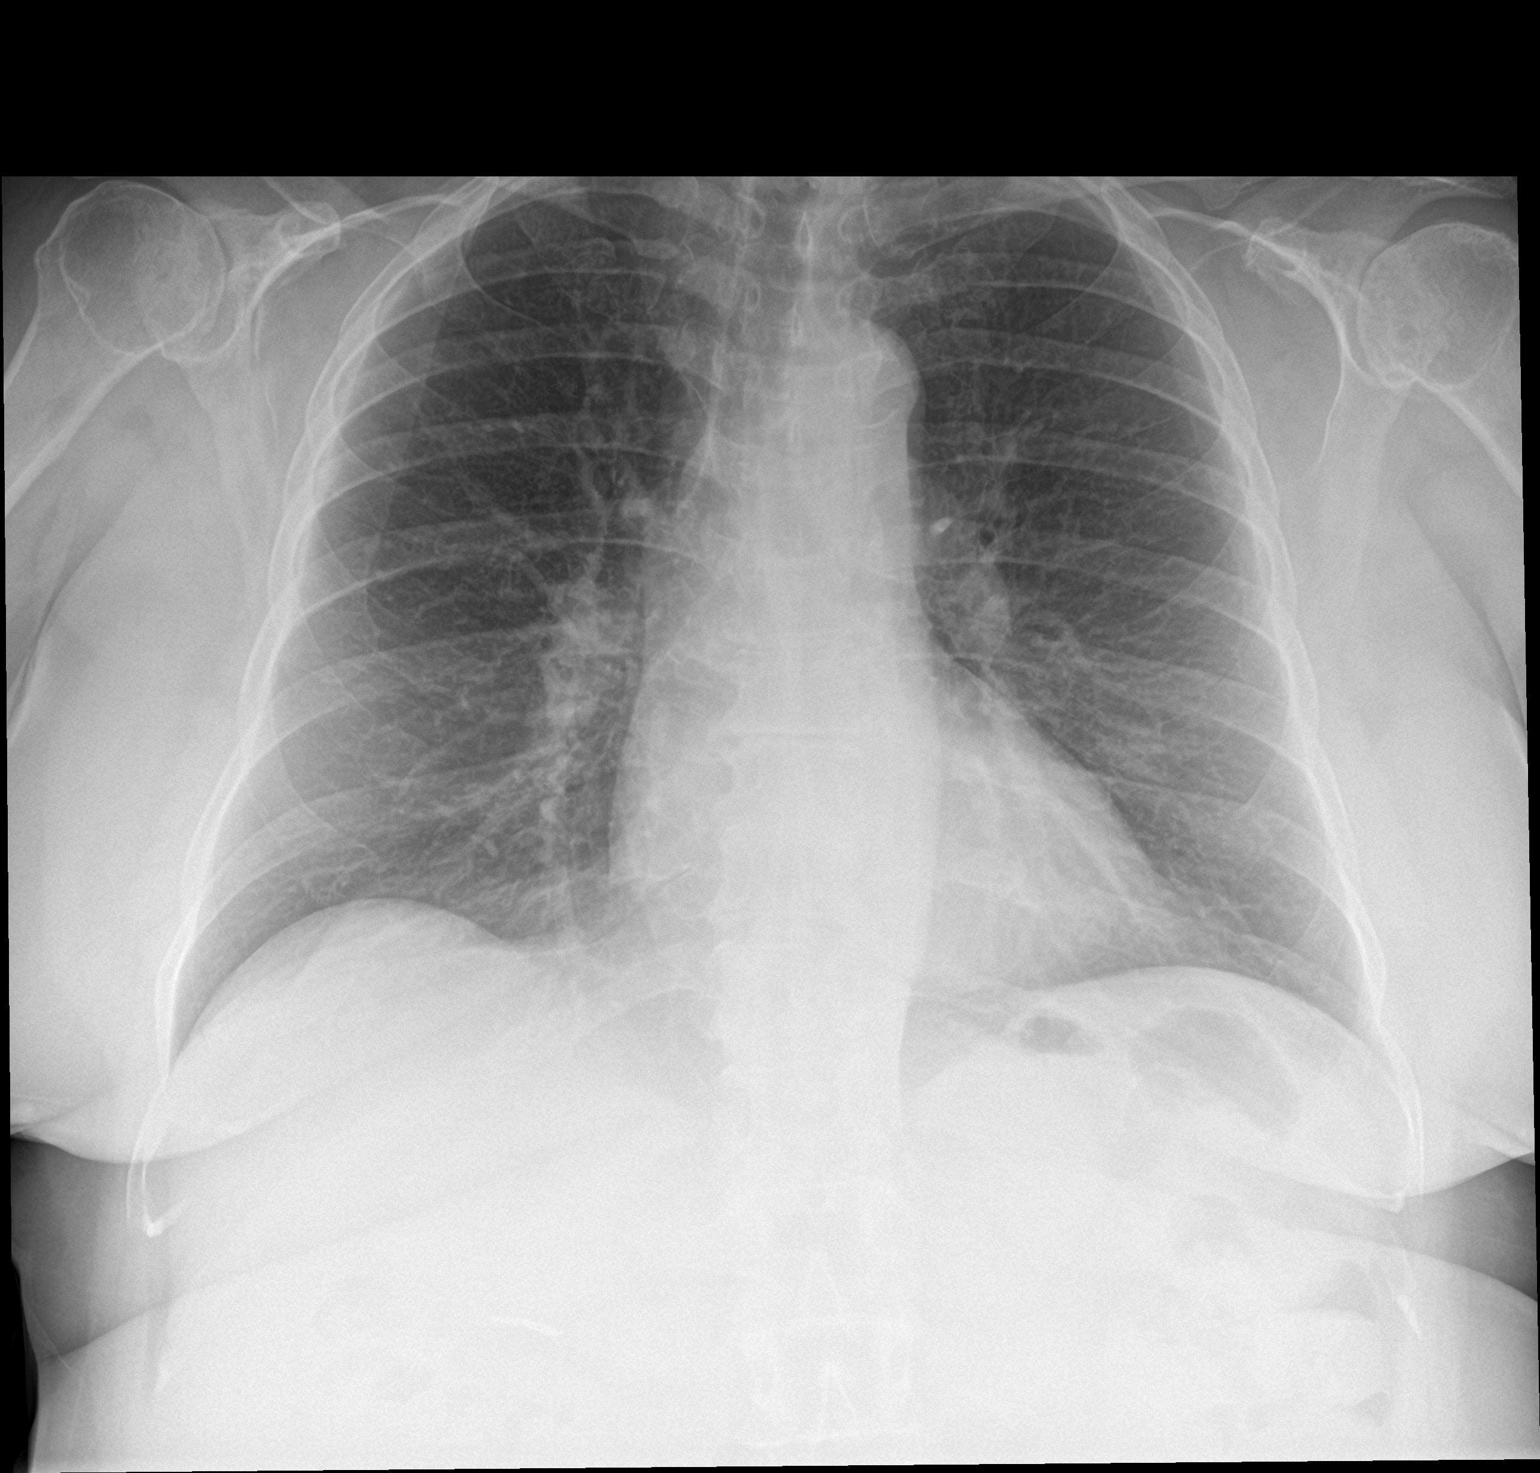

[chest lat]
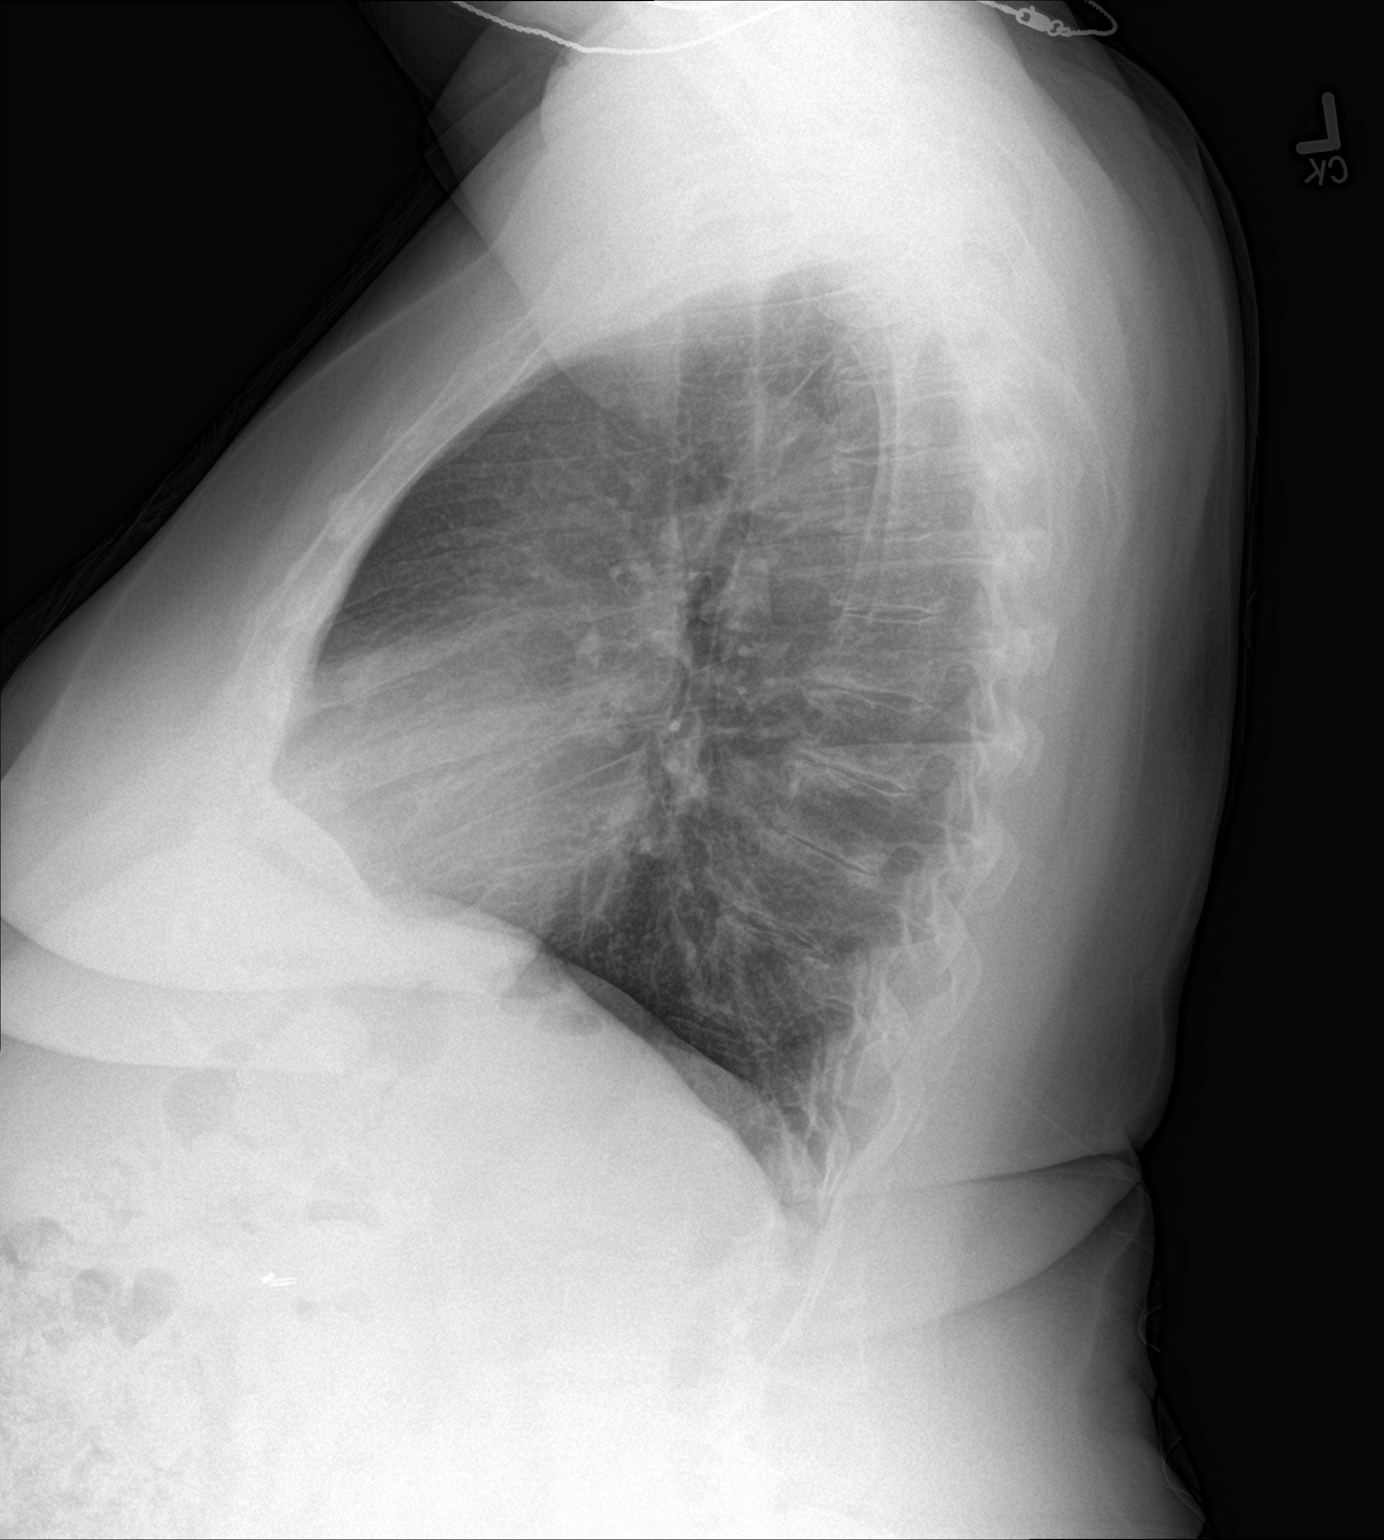

[2 of 2 positions shown; findings below may reference images not displayed]

FINDINGS: The cardiomediastinal contours are normal. The lungs are clear.
Pulmonary vasculature is normal. No consolidation, pleural effusion,
or pneumothorax. No acute osseous abnormalities are seen.
IMPRESSION: No acute chest findings.  No evidence of pneumonia.

## 2020-04-23 ENCOUNTER — Ambulatory Visit (INDEPENDENT_AMBULATORY_CARE_PROVIDER_SITE_OTHER): Payer: Medicare HMO | Admitting: Family Medicine

## 2020-05-14 ENCOUNTER — Other Ambulatory Visit (INDEPENDENT_AMBULATORY_CARE_PROVIDER_SITE_OTHER): Payer: Self-pay | Admitting: Family Medicine

## 2020-05-14 DIAGNOSIS — R7303 Prediabetes: Secondary | ICD-10-CM

## 2020-05-20 ENCOUNTER — Encounter (INDEPENDENT_AMBULATORY_CARE_PROVIDER_SITE_OTHER): Payer: Self-pay | Admitting: Family Medicine

## 2020-05-20 ENCOUNTER — Other Ambulatory Visit: Payer: Self-pay

## 2020-05-20 ENCOUNTER — Ambulatory Visit (INDEPENDENT_AMBULATORY_CARE_PROVIDER_SITE_OTHER): Payer: Medicare HMO | Admitting: Family Medicine

## 2020-05-20 VITALS — BP 127/79 | HR 83 | Temp 98.6°F | Ht <= 58 in | Wt 144.0 lb

## 2020-05-20 DIAGNOSIS — Z6834 Body mass index (BMI) 34.0-34.9, adult: Secondary | ICD-10-CM | POA: Diagnosis not present

## 2020-05-20 DIAGNOSIS — E559 Vitamin D deficiency, unspecified: Secondary | ICD-10-CM

## 2020-05-20 DIAGNOSIS — R7303 Prediabetes: Secondary | ICD-10-CM

## 2020-05-20 DIAGNOSIS — K5909 Other constipation: Secondary | ICD-10-CM | POA: Diagnosis not present

## 2020-05-20 DIAGNOSIS — E669 Obesity, unspecified: Secondary | ICD-10-CM | POA: Diagnosis not present

## 2020-05-20 MED ORDER — OZEMPIC (0.25 OR 0.5 MG/DOSE) 2 MG/1.5ML ~~LOC~~ SOPN: 0.2500 mg | PEN_INJECTOR | SUBCUTANEOUS | 0 refills | Status: DC

## 2020-05-20 MED ORDER — VITAMIN D (ERGOCALCIFEROL) 1.25 MG (50000 UNIT) PO CAPS: 50000.0000 [IU] | ORAL_CAPSULE | ORAL | 0 refills | Status: DC

## 2020-05-20 NOTE — Progress Notes (Signed)
Chief Complaint:   OBESITY [name] is here to discuss her progress with her obesity treatment plan along with follow-up of her obesity related diagnoses. [name] is on following a lower carbohydrate, vegetable and lean protein rich diet plan and states she is following her eating plan approximately 75% of the time. [name] states she is doing 0 minutes 0 times per week.  Today's visit was #: 6 Starting weight: 148 lbs Starting date: 01/23/2020 Today's weight: 144 lbs Today's date: 05/20/2020 Total lbs lost to date: 4 Total lbs lost since last in-office visit: 2  Interim History: [name] is loosely following Category 1 but she is not eating the bread. She sometimes gets a salad from [employer] if she doesn't cook at home and eats it over two meals.  She is taking OTC appetite suppressant which is helping with appetite. She says she is trying t olimit calories to < 2000/day. She is making some home-made shakes with protein powder. She reports having pain from sciatica and needs an injection.  Subjective:   1. Vitamin D deficiency Vanilla's last Vit D was low at 26.6. She is on weekly prescription Vit D.  2. Chronic constipation [name] is seeing GI this week for chronic constipation and bloating. She is status post hysterectomy. Colonoscopy (2019) results reviewed.  3. Pre-diabetes with impaired fasting glucose [name]'s last A1c was 5.9. She says she has quite a bit of hunger and she is unable to adhere to 1100 calories per day. She has no history of pancreatitis, chololithiasis, or family or personal history of thyroid cancer. She continues to work on diet and exercise to decrease her risk of diabetes.   Lab Results  Component Value Date   HGBA1C 5.9 (H) 01/23/2020   Lab Results  Component Value Date   INSULIN 7.3 01/23/2020   Assessment/Plan:   1. Vitamin D deficiency . We will refill prescription Vitamin D for 1 month. Altheia will follow-up for routine testing of Vitamin  D, at least 2-3 times per year to avoid over-replacement.  - Vitamin D, Ergocalciferol, (DRISDOL) 1.25 MG (50000 UNIT) CAPS capsule; Take 1 capsule (50,000 Units total) by mouth every 7 (seven) days.  Dispense: 4 capsule; Refill: 0  2. Chronic constipation Rechy will follow up with GI this week.  3. Pre-diabetes with impaired fasting glucose [name] agreed to start Ozempic 0.25 mg weekly with no refills. - Semaglutide,0.25 or 0.5MG /DOS, (OZEMPIC, 0.25 OR 0.5 MG/DOSE,) 2 MG/1.5ML SOPN; Inject 0.25 mg into the skin once a week.  Dispense: 1.5 mL; Refill: 0  4. Obesity: Current BMI 33.47 [name] is currently in the action stage of change. As such, her goal is to continue with weight loss efforts. She has agreed to practicing portion control and making smarter food choices, such as increasing vegetables and decreasing simple carbohydrates. Try to keep calories <1500.  Exercise goals: No exercise has been prescribed at this time.  Behavioral modification strategies: increasing lean protein intake and decreasing simple carbohydrates.  [name] has agreed to follow-up with our clinic in 4 weeks.    Objective:   Blood pressure 127/79, pulse 83, temperature 98.6 F (37 C), height 4\' 7"  (1.397 m), weight 144 lb (65.3 kg), SpO2 96 %. Body mass index is 33.47 kg/m.  General: Cooperative, alert, well developed, in no acute distress. HEENT: Conjunctivae and lids unremarkable. Cardiovascular: Regular rhythm.  Lungs: Normal work of breathing. Neurologic: No focal deficits.   Lab Results  Component Value Date   CREATININE 0.73 03/06/2020  BUN 14 03/06/2020   NA 139 03/06/2020   K 3.6 03/06/2020   CL 104 03/06/2020   CO2 28 03/06/2020   Lab Results  Component Value Date   ALT 27 01/23/2020   AST 26 01/23/2020   ALKPHOS 45 01/23/2020   BILITOT 0.4 01/23/2020   Lab Results  Component Value Date   HGBA1C 5.9 (H) 01/23/2020   HGBA1C 5.8 04/10/2014   Lab Results  Component Value  Date   INSULIN 7.3 01/23/2020   Lab Results  Component Value Date   TSH 2.11 09/30/2018   Lab Results  Component Value Date   CHOL 145 01/23/2020   HDL 54 01/23/2020   LDLCALC 77 01/23/2020   LDLDIRECT 131.0 10/01/2016   TRIG 72 01/23/2020   CHOLHDL 3 12/22/2019   Lab Results  Component Value Date   WBC 11.4 (H) 03/06/2020   HGB 12.4 03/06/2020   HCT 38.4 03/06/2020   MCV 87.7 03/06/2020   PLT 332 03/06/2020   No results found for: IRON, TIBC, FERRITIN  Obesity Behavioral Intervention:   Approximately 15 minutes were spent on the discussion below.  ASK: We discussed the diagnosis of obesity with [name] today and [name] agreed to give [place] permission to discuss obesity behavioral modification therapy today.  ASSESS: [name] has the diagnosis of obesity and her BMI today is 33.47. [name] is in the action stage of change.   ADVISE: Mahlet was educated on the multiple health risks of obesity as well as the benefit of weight loss to improve her health. She was advised of the need for long term treatment and the importance of lifestyle modifications to improve her current health and to decrease her risk of future health problems.  AGREE: Multiple dietary modification options and treatment options were discussed and [name] agreed to follow the recommendations documented in the above note.  ARRANGE: [name] was educated on the importance of frequent visits to treat obesity as outlined per CMS and USPSTF guidelines and agreed to schedule her next follow up appointment today.  Attestation Statements:   Reviewed by clinician on day of visit: allergies, medications, problem list, medical history, surgical history, family history, social history, and previous encounter notes.   [name], am acting as Location manager for [employer], FNP-C.  I have reviewed the above documentation for accuracy and completeness, and I agree with the above. -  [name],  FNP

## 2020-05-21 DIAGNOSIS — K439 Ventral hernia without obstruction or gangrene: Secondary | ICD-10-CM | POA: Diagnosis not present

## 2020-05-21 DIAGNOSIS — K219 Gastro-esophageal reflux disease without esophagitis: Secondary | ICD-10-CM | POA: Diagnosis not present

## 2020-05-21 DIAGNOSIS — K5904 Chronic idiopathic constipation: Secondary | ICD-10-CM | POA: Diagnosis not present

## 2020-05-24 ENCOUNTER — Other Ambulatory Visit: Payer: Self-pay | Admitting: Family Medicine

## 2020-05-24 ENCOUNTER — Telehealth: Payer: Self-pay | Admitting: Pharmacist

## 2020-05-24 NOTE — Chronic Care Management (AMB) (Signed)
Chronic Care Management Pharmacy Assistant   Name: [name]  MRN: [id number] DOB: 1950/04/17   Reason for Encounter: Disease State General Adherence call    Recent office visits:  03/19/2020 Roma Schanz (PCP)- Follow up on Reflux. Ambulatory referral to Ophthalmology & Gastroenterology. Encouraged heart healthy diet, increase exercise, avoid trans fats, consider a krill oil cap daily. Follow up in 6 months. 12/22/2019 Roma Schanz, DO (PCP) Behavioral health follow up. Discontinued Metronidazole. Referral to Psychiatry Follow up in 3 months.  Recent consult visits:  05/20/2020 Dawn [name] (Weight management) Follow up on weight loss.Start Ozempic 0.25 mg weekly. Follow up in 4 weeks. 04/01/2020 Dawn [name] (Weight management)- Follow up on weight loss. Continue to take prescription Vitamin D @50 ,000 IU every week. Discontinue Metformin. Follow up 2-3 weeks. 03/12/2020 Dawn [name] (Weight management) Follow up on weight loss. Follow up in 2 weeks. 02/27/2020 Dawn [name] (Weight management) Follow up on weight loss. Start metformin 500 mg q AM with breakfast. Follow up in 2 weeks. 02/15/2020 Mellody Dance, DO (Weight management) Follow up on weight loss. Start on Vitamin D, Ergocalciferol, (DRISDOL) 1.25 MG (50000 UNIT) CAPS. Follow up in 2 weeks.  01/23/2020 Mal Misty, MD ( Weight Management) Evaluation for Weight loss. Follow up in 2 weeks.  Hospital visits:  Medication Reconciliation was completed by comparing discharge summary, patient's EMR and Pharmacy list, and upon discussion with patient.  Admitted to the hospital on 03/05/2020 due to Sepsis due to cellulitis. Discharge date was 03/07/2020. [facility]charged from [facility].   New?Medications Started at [facility] Discharge:?? None noted  Medication Changes at Hospital Discharge: None noted  Medications Discontinued at Hospital Discharge: None noted  Medications that remain  the same after Hospital Discharge:??  -All other medications will remain the same.    Medications: Outpatient Encounter Medications as of 05/24/2020  Medication Sig  . ARIPiprazole (ABILIFY) 2 MG tablet Take 1 tablet (2 mg total) by mouth daily.  . cholecalciferol (VITAMIN D3) 25 MCG (1000 UNIT) tablet Take 2,000 Units by mouth daily.  . cyclobenzaprine (FLEXERIL) 10 MG tablet TAKE 1 TABLET  2  TIMES DAILY AS NEEDED FOR MUSCLE SPASMS.  Marland Kitchen escitalopram (LEXAPRO) 20 MG tablet TAKE 1 TABLET EVERY DAY  . fenofibrate 160 MG tablet Take 1 tablet (160 mg total) by mouth daily.  . fluticasone (FLONASE) 50 MCG/ACT nasal spray Place 2 sprays into both nostrils daily. (Patient taking differently: Place 2 sprays into both nostrils daily as needed.)  . hydrochlorothiazide (HYDRODIURIL) 25 MG tablet TAKE 1 TABLET EVERY DAY  . hyoscyamine (LEVSIN SL) 0.125 MG SL tablet Place 1 tablet (0.125 mg total) under the tongue every 4 (four) hours as needed.  Marland Kitchen LYRICA 50 MG capsule Take 100 mg by mouth 2 (two) times daily. Prescribed by pain clinic and pt usually takes 2 to 3 daily if needed  . methylphenidate (RITALIN) 10 MG tablet Take 1 tablet (10 mg total) by mouth 2 (two) times daily.  . Multiple Vitamin (MULTIVITAMIN WITH MINERALS) TABS tablet Take 1 tablet by mouth daily.  Marland Kitchen omeprazole (PRILOSEC) 40 MG capsule 1 po bid (Patient taking differently: Take 40 mg by mouth in the morning and at bedtime. 1 po bid)  . ondansetron (ZOFRAN) 4 MG tablet Take 1 tablet (4 mg total) by mouth daily as needed for nausea or vomiting.  Marland Kitchen rOPINIRole (REQUIP) 0.5 MG tablet Take 1 tablet (0.5 mg total) by mouth 2 (two) times daily.  . Semaglutide,0.25 or  0.5MG /DOS, (OZEMPIC, 0.25 OR 0.5 MG/DOSE,) 2 MG/1.5ML SOPN Inject 0.25 mg into the skin once a week.  . traMADol (ULTRAM-ER) 100 MG 24 hr tablet Take 100 mg by mouth daily as needed for pain.  . valACYclovir (VALTREX) 1000 MG tablet Take 1 tablet (1,000 mg total) by mouth 2 (two)  times daily.  . VENTOLIN HFA 108 (90 Base) MCG/ACT inhaler INHALE 2 PUFFS INTO LUNGS EVERY 6 HOURS AS NEEDED FOR WHEEZING OR SHORTNESS OF BREATH  . Vitamin D, Ergocalciferol, (DRISDOL) 1.25 MG (50000 UNIT) CAPS capsule Take 1 capsule (50,000 Units total) by mouth every 7 (seven) days.  Marland Kitchen zolpidem (AMBIEN) 5 MG tablet TAKE 1 TABLET BY MOUTH AT BEDTIME AS NEEDED (Patient taking differently: Take 5 mg by mouth at bedtime.)   No facility-administered encounter medications on file as of 05/24/2020.   Have you had any problems recently with your health? Patient states she is not having any problems at this time.  Have you had any problems with your pharmacy? Patient states she is not having any problems with her pharmacy.  What issues or side effects are you having with your medications? Patient states she is not having any side effects.  What would you like me to pass along to [facility] for them to help you with?  Patient states she has not started the ozempic yet but will start next week. Patient states she is not taking the abilify.Patient states she is only taking the ritalin as needed.   What can we do to take care of you better? Patient states there is nothing at this time.  Star Rating Drugs: Ozempic 0.25 mg Last filled: None noted  [facility] Clinical Pharmacist Assistant [phone]

## 2020-05-30 DIAGNOSIS — M5416 Radiculopathy, lumbar region: Secondary | ICD-10-CM | POA: Diagnosis not present

## 2020-06-12 ENCOUNTER — Other Ambulatory Visit: Payer: Self-pay

## 2020-06-12 ENCOUNTER — Encounter (INDEPENDENT_AMBULATORY_CARE_PROVIDER_SITE_OTHER): Payer: Self-pay | Admitting: Family Medicine

## 2020-06-12 ENCOUNTER — Ambulatory Visit (INDEPENDENT_AMBULATORY_CARE_PROVIDER_SITE_OTHER): Payer: Medicare HMO | Admitting: Family Medicine

## 2020-06-12 VITALS — BP 107/63 | HR 68 | Temp 98.7°F | Ht <= 58 in | Wt 145.0 lb

## 2020-06-12 DIAGNOSIS — E559 Vitamin D deficiency, unspecified: Secondary | ICD-10-CM

## 2020-06-12 DIAGNOSIS — R7303 Prediabetes: Secondary | ICD-10-CM | POA: Diagnosis not present

## 2020-06-12 DIAGNOSIS — Z6834 Body mass index (BMI) 34.0-34.9, adult: Secondary | ICD-10-CM

## 2020-06-12 DIAGNOSIS — D72829 Elevated white blood cell count, unspecified: Secondary | ICD-10-CM

## 2020-06-12 DIAGNOSIS — E669 Obesity, unspecified: Secondary | ICD-10-CM | POA: Diagnosis not present

## 2020-06-12 MED ORDER — OZEMPIC (0.25 OR 0.5 MG/DOSE) 2 MG/1.5ML ~~LOC~~ SOPN: 0.5000 mg | PEN_INJECTOR | SUBCUTANEOUS | 0 refills | Status: DC

## 2020-06-12 MED ORDER — VITAMIN D (ERGOCALCIFEROL) 1.25 MG (50000 UNIT) PO CAPS
50000.0000 [IU] | ORAL_CAPSULE | ORAL | 0 refills | Status: DC
Start: 2020-06-12 — End: 2020-08-05

## 2020-06-13 ENCOUNTER — Encounter (INDEPENDENT_AMBULATORY_CARE_PROVIDER_SITE_OTHER): Payer: Self-pay | Admitting: Family Medicine

## 2020-06-13 DIAGNOSIS — K439 Ventral hernia without obstruction or gangrene: Secondary | ICD-10-CM | POA: Diagnosis not present

## 2020-06-13 DIAGNOSIS — M6208 Separation of muscle (nontraumatic), other site: Secondary | ICD-10-CM | POA: Diagnosis not present

## 2020-06-13 LAB — COMPREHENSIVE METABOLIC PANEL
ALT: 31 IU/L (ref 0–32)
AST: 27 IU/L (ref 0–40)
Albumin/Globulin Ratio: 2.3 — ABNORMAL HIGH (ref 1.2–2.2)
Albumin: 4.5 g/dL (ref 3.8–4.8)
Alkaline Phosphatase: 71 IU/L (ref 44–121)
BUN/Creatinine Ratio: 18 (ref 12–28)
BUN: 20 mg/dL (ref 8–27)
Bilirubin Total: 0.2 mg/dL (ref 0.0–1.2)
CO2: 21 mmol/L (ref 20–29)
Calcium: 9.7 mg/dL (ref 8.7–10.3)
Chloride: 104 mmol/L (ref 96–106)
Creatinine, Ser: 1.12 mg/dL — ABNORMAL HIGH (ref 0.57–1.00)
Globulin, Total: 2 g/dL (ref 1.5–4.5)
Glucose: 88 mg/dL (ref 65–99)
Potassium: 4.2 mmol/L (ref 3.5–5.2)
Sodium: 140 mmol/L (ref 134–144)
Total Protein: 6.5 g/dL (ref 6.0–8.5)
eGFR: 53 mL/min/{1.73_m2} — ABNORMAL LOW (ref >59)

## 2020-06-13 LAB — CBC WITH DIFFERENTIAL/PLATELET
Basophils Absolute: 0.1 10*3/uL (ref 0.0–0.2)
Basos: 1 %
EOS (ABSOLUTE): 0.3 10*3/uL (ref 0.0–0.4)
Eos: 3 %
Hematocrit: 41.7 % (ref 34.0–46.6)
Hemoglobin: 13.6 g/dL (ref 11.1–15.9)
Immature Grans (Abs): 0 10*3/uL (ref 0.0–0.1)
Immature Granulocytes: 0 %
Lymphocytes Absolute: 2.8 10*3/uL (ref 0.7–3.1)
Lymphs: 30 %
MCH: 28.4 pg (ref 26.6–33.0)
MCHC: 32.6 g/dL (ref 31.5–35.7)
MCV: 87 fL (ref 79–97)
Monocytes Absolute: 0.7 10*3/uL (ref 0.1–0.9)
Monocytes: 7 %
Neutrophils Absolute: 5.3 10*3/uL (ref 1.4–7.0)
Neutrophils: 59 %
Platelets: 354 10*3/uL (ref 150–450)
RBC: 4.79 x10E6/uL (ref 3.77–5.28)
RDW: 11.9 % (ref 11.7–15.4)
WBC: 9.1 10*3/uL (ref 3.4–10.8)

## 2020-06-13 LAB — HEMOGLOBIN A1C
Est. average glucose Bld gHb Est-mCnc: 117 mg/dL
Hgb A1c MFr Bld: 5.7 % — ABNORMAL HIGH (ref 4.8–5.6)

## 2020-06-13 LAB — INSULIN, RANDOM: INSULIN: 17.8 u[IU]/mL (ref 2.6–24.9)

## 2020-06-13 LAB — VITAMIN D 25 HYDROXY (VIT D DEFICIENCY, FRACTURES): Vit D, 25-Hydroxy: 67.2 ng/mL (ref 30.0–100.0)

## 2020-06-13 NOTE — Progress Notes (Signed)
Chief Complaint:   OBESITY [name] is here to discuss her progress with her obesity treatment plan along with follow-up of her obesity related diagnoses. [name] is on practicing portion control and making smarter food choices, such as increasing vegetables and decreasing simple carbohydrates and states she is following her eating plan approximately 90% of the time. [name] states she is not exercising regularly.  Today's visit was #: 7 Starting weight: 148 lbs Starting date: 01/23/2020 Today's weight: 145 lbs Today's date: 06/12/2020 Total lbs lost to date: +1 Total lbs lost since last in-office visit: 3 lbs  Interim History: [name] notes she is avoiding bread and trying to make healthy choices.  She has occasionally been drinking alcohol.   She works 3-4 days per week.  She says she has no time to journal. She does not want to follow Category 1.   She often picks up Zaxby salads and has the [name] and [name] on the salad. She eats the [place] dressing. She eats the salad over 2 meals.  Subjective:   1. Pre-diabetes with impaired fasting glucose Started Ozempic.  Denies nausea.  Has chronic constipation. She feels the Ozempic is helping control her appetite.  Lab Results  Component Value Date   HGBA1C 5.7 (H) 06/12/2020   Lab Results  Component Value Date   INSULIN 17.8 06/12/2020   INSULIN 7.3 01/23/2020   2. Vitamin D deficiency Vitamin D is low at 26.6.  On weekly prescription vitamin D.  3. Leukocytosis, unspecified type Last WBC was 18.4 (high).  She reports periodic elevations.  She has a pilonidal cyst and she wonders if this elevates her white count.  Assessment/Plan:   1. Pre-diabetes with impaired fasting glucose Increase dose of Ozempic to 0.50 mg subcutaneously weekly.  Check A1c, CMP, and fasting insulin level today.  - Increase and refill Semaglutide,0.25 or 0.5MG /DOS, (OZEMPIC, 0.25 OR 0.5 MG/DOSE,) 2 MG/1.5ML SOPN; Inject 0.5 mg into the skin  once a week.  Dispense: 1.5 mL; Refill: 0 - Hemoglobin A1c - Insulin, random - Comprehensive metabolic panel  2. Vitamin D deficiency Refill vitamin D 50,000 IU weekly and check vitamin D level today, as per below.  - Refill Vitamin D, Ergocalciferol, (DRISDOL) 1.25 MG (50000 UNIT) CAPS capsule; Take 1 capsule (50,000 Units total) by mouth every 7 (seven) days.  Dispense: 12 capsule; Refill: 0 - VITAMIN D 25 Hydroxy (Vit-D Deficiency, Fractures)  3. Leukocytosis, unspecified type Check CBC with differential today.  - CBC with Differential/Platelet  4. Obesity: Current BMI 33.7  [name] is currently in the action stage of change. As such, her goal is to continue with weight loss efforts. She has agreed to keeping a food journal and adhering to recommended goals of 1000-1100 calories and 75 grams of protein and practicing portion control and making smarter food choices, such as increasing vegetables and decreasing simple carbohydrates.   She will download MyFitness Pal and use it.  Exercise goals: Older adults should follow the adult guidelines. When older adults cannot meet the adult guidelines, they should be as physically active as their abilities and conditions will allow.  Older adults should do exercises that maintain or improve balance if they are at risk of falling.   Behavioral modification strategies: decreasing simple carbohydrates, planning for success and keeping a strict food journal.  [name] has agreed to follow-up with our clinic in 4 weeks. [name] was informed we would discuss her lab results at her next visit unless there is  a critical issue that needs to be addressed sooner. [name] agreed to keep her next visit at the agreed upon time to discuss these results.  Objective:   Blood pressure 107/63, pulse 68, temperature 98.7 F (37.1 C), height 4\' 7"  (1.397 m), weight 145 lb (65.8 kg), SpO2 97 %. Body mass index is 33.7 kg/m.  General: Cooperative, alert, well  developed, in no acute distress. HEENT: Conjunctivae and lids unremarkable. Cardiovascular: Regular rhythm.  Lungs: Normal work of breathing. Neurologic: No focal deficits.   Lab Results  Component Value Date   CREATININE 1.12 (H) 06/12/2020   BUN 20 06/12/2020   NA 140 06/12/2020   K 4.2 06/12/2020   CL 104 06/12/2020   CO2 21 06/12/2020   Lab Results  Component Value Date   ALT 31 06/12/2020   AST 27 06/12/2020   ALKPHOS 71 06/12/2020   BILITOT 0.2 06/12/2020   Lab Results  Component Value Date   HGBA1C 5.7 (H) 06/12/2020   HGBA1C 5.9 (H) 01/23/2020   HGBA1C 5.8 04/10/2014   Lab Results  Component Value Date   INSULIN 17.8 06/12/2020   INSULIN 7.3 01/23/2020   Lab Results  Component Value Date   TSH 2.11 09/30/2018   Lab Results  Component Value Date   CHOL 145 01/23/2020   HDL 54 01/23/2020   LDLCALC 77 01/23/2020   LDLDIRECT 131.0 10/01/2016   TRIG 72 01/23/2020   CHOLHDL 3 12/22/2019   Lab Results  Component Value Date   WBC 9.1 06/12/2020   HGB 13.6 06/12/2020   HCT 41.7 06/12/2020   MCV 87 06/12/2020   PLT 354 06/12/2020   Obesity Behavioral Intervention:   Approximately 15 minutes were spent on the discussion below.  ASK: We discussed the diagnosis of obesity with [name] today and [name] agreed to give [place] permission to discuss obesity behavioral modification therapy today.  ASSESS: [name] has the diagnosis of obesity and her BMI today is 33.8. [name] is in the action stage of change.   ADVISE: [name] was educated on the multiple health risks of obesity as well as the benefit of weight loss to improve her health. She was advised of the need for long term treatment and the importance of lifestyle modifications to improve her current health and to decrease her risk of future health problems.  AGREE: Multiple dietary modification options and treatment options were discussed and [name] agreed to follow the recommendations documented in the  above note.  ARRANGE: [name] was educated on the importance of frequent visits to treat obesity as outlined per CMS and USPSTF guidelines and agreed to schedule her next follow up appointment today.  Attestation Statements:   Reviewed by clinician on day of visit: allergies, medications, problem list, medical history, surgical history, family history, social history, and previous encounter notes.  I, Water quality scientist, CMA, am acting as Location manager for [employer], [place].  I have reviewed the above documentation for accuracy and completeness, and I agree with the above. -  [name], FNP

## 2020-06-17 DIAGNOSIS — G894 Chronic pain syndrome: Secondary | ICD-10-CM | POA: Diagnosis not present

## 2020-06-17 DIAGNOSIS — M62830 Muscle spasm of back: Secondary | ICD-10-CM | POA: Diagnosis not present

## 2020-06-26 ENCOUNTER — Encounter: Payer: Self-pay | Admitting: Family Medicine

## 2020-06-27 ENCOUNTER — Other Ambulatory Visit: Payer: Self-pay

## 2020-06-27 DIAGNOSIS — M9901 Segmental and somatic dysfunction of cervical region: Secondary | ICD-10-CM | POA: Diagnosis not present

## 2020-06-27 DIAGNOSIS — M5413 Radiculopathy, cervicothoracic region: Secondary | ICD-10-CM | POA: Diagnosis not present

## 2020-06-27 DIAGNOSIS — M9903 Segmental and somatic dysfunction of lumbar region: Secondary | ICD-10-CM | POA: Diagnosis not present

## 2020-06-27 DIAGNOSIS — M546 Pain in thoracic spine: Secondary | ICD-10-CM | POA: Diagnosis not present

## 2020-06-27 DIAGNOSIS — M9902 Segmental and somatic dysfunction of thoracic region: Secondary | ICD-10-CM | POA: Diagnosis not present

## 2020-06-27 DIAGNOSIS — M5451 Vertebrogenic low back pain: Secondary | ICD-10-CM | POA: Diagnosis not present

## 2020-06-27 MED ORDER — ALENDRONATE SODIUM 70 MG PO TABS: ORAL_TABLET | ORAL | 3 refills | Status: DC

## 2020-07-02 ENCOUNTER — Other Ambulatory Visit (INDEPENDENT_AMBULATORY_CARE_PROVIDER_SITE_OTHER): Payer: Self-pay | Admitting: Family Medicine

## 2020-07-02 DIAGNOSIS — R7303 Prediabetes: Secondary | ICD-10-CM

## 2020-07-07 DIAGNOSIS — M79671 Pain in right foot: Secondary | ICD-10-CM | POA: Diagnosis not present

## 2020-07-07 DIAGNOSIS — M7061 Trochanteric bursitis, right hip: Secondary | ICD-10-CM | POA: Diagnosis not present

## 2020-07-08 DIAGNOSIS — M5413 Radiculopathy, cervicothoracic region: Secondary | ICD-10-CM | POA: Diagnosis not present

## 2020-07-08 DIAGNOSIS — M9902 Segmental and somatic dysfunction of thoracic region: Secondary | ICD-10-CM | POA: Diagnosis not present

## 2020-07-08 DIAGNOSIS — M546 Pain in thoracic spine: Secondary | ICD-10-CM | POA: Diagnosis not present

## 2020-07-08 DIAGNOSIS — M9903 Segmental and somatic dysfunction of lumbar region: Secondary | ICD-10-CM | POA: Diagnosis not present

## 2020-07-08 DIAGNOSIS — M5451 Vertebrogenic low back pain: Secondary | ICD-10-CM | POA: Diagnosis not present

## 2020-07-08 DIAGNOSIS — M9901 Segmental and somatic dysfunction of cervical region: Secondary | ICD-10-CM | POA: Diagnosis not present

## 2020-07-10 ENCOUNTER — Other Ambulatory Visit: Payer: Self-pay

## 2020-07-10 ENCOUNTER — Encounter (INDEPENDENT_AMBULATORY_CARE_PROVIDER_SITE_OTHER): Payer: Self-pay | Admitting: Family Medicine

## 2020-07-10 ENCOUNTER — Ambulatory Visit (INDEPENDENT_AMBULATORY_CARE_PROVIDER_SITE_OTHER): Payer: Medicare HMO | Admitting: Family Medicine

## 2020-07-10 VITALS — BP 113/70 | HR 99 | Temp 98.4°F | Ht <= 58 in | Wt 144.0 lb

## 2020-07-10 DIAGNOSIS — E559 Vitamin D deficiency, unspecified: Secondary | ICD-10-CM | POA: Diagnosis not present

## 2020-07-10 DIAGNOSIS — R7303 Prediabetes: Secondary | ICD-10-CM | POA: Diagnosis not present

## 2020-07-10 DIAGNOSIS — Z6834 Body mass index (BMI) 34.0-34.9, adult: Secondary | ICD-10-CM

## 2020-07-10 DIAGNOSIS — E669 Obesity, unspecified: Secondary | ICD-10-CM

## 2020-07-10 MED ORDER — OZEMPIC (0.25 OR 0.5 MG/DOSE) 2 MG/1.5ML ~~LOC~~ SOPN
0.5000 mg | PEN_INJECTOR | SUBCUTANEOUS | 0 refills | Status: DC
Start: 2020-07-10 — End: 2020-08-05

## 2020-07-11 ENCOUNTER — Encounter (INDEPENDENT_AMBULATORY_CARE_PROVIDER_SITE_OTHER): Payer: Self-pay | Admitting: Family Medicine

## 2020-07-11 DIAGNOSIS — M546 Pain in thoracic spine: Secondary | ICD-10-CM | POA: Diagnosis not present

## 2020-07-11 DIAGNOSIS — M9903 Segmental and somatic dysfunction of lumbar region: Secondary | ICD-10-CM | POA: Diagnosis not present

## 2020-07-11 DIAGNOSIS — M9902 Segmental and somatic dysfunction of thoracic region: Secondary | ICD-10-CM | POA: Diagnosis not present

## 2020-07-11 DIAGNOSIS — M5451 Vertebrogenic low back pain: Secondary | ICD-10-CM | POA: Diagnosis not present

## 2020-07-11 DIAGNOSIS — M5413 Radiculopathy, cervicothoracic region: Secondary | ICD-10-CM | POA: Diagnosis not present

## 2020-07-11 DIAGNOSIS — M9901 Segmental and somatic dysfunction of cervical region: Secondary | ICD-10-CM | POA: Diagnosis not present

## 2020-07-12 ENCOUNTER — Encounter: Payer: Self-pay | Admitting: Family Medicine

## 2020-07-12 ENCOUNTER — Other Ambulatory Visit (HOSPITAL_COMMUNITY)
Admission: RE | Admit: 2020-07-12 | Discharge: 2020-07-12 | Disposition: A | Discharge status: Home / Self Care | Payer: Medicare HMO | Source: Ambulatory Visit | Attending: Family Medicine | Admitting: Family Medicine

## 2020-07-12 ENCOUNTER — Ambulatory Visit (INDEPENDENT_AMBULATORY_CARE_PROVIDER_SITE_OTHER): Payer: Medicare HMO | Admitting: Family Medicine

## 2020-07-12 ENCOUNTER — Other Ambulatory Visit: Payer: Self-pay

## 2020-07-12 VITALS — BP 116/82 | HR 85 | Temp 98.2°F | Resp 18 | Ht <= 58 in | Wt 144.6 lb

## 2020-07-12 DIAGNOSIS — G8929 Other chronic pain: Secondary | ICD-10-CM

## 2020-07-12 DIAGNOSIS — Z9289 Personal history of other medical treatment: Secondary | ICD-10-CM | POA: Insufficient documentation

## 2020-07-12 DIAGNOSIS — M5441 Lumbago with sciatica, right side: Secondary | ICD-10-CM

## 2020-07-12 DIAGNOSIS — M069 Rheumatoid arthritis, unspecified: Secondary | ICD-10-CM | POA: Diagnosis not present
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
## 2020-07-12 NOTE — Patient Instructions (Signed)
Rheumatoid Arthritis Rheumatoid arthritis (RA) is a long-term (chronic) disease that causes inflammation in your joints. RA may start slowly. It most often affects the small joints of the hands and feet. Usually, the same joints are affected on both sides of your body. Inflammation from RA can also affectother parts of your body, including your heart, eyes, or lungs. There is no cure for RA, but medicines can help your symptoms and halt or slowdown the progression of the disease. What are the causes? RA is an autoimmune disease. When you have an autoimmune disease, your body's defense system (immune system) mistakenly attacks healthy body tissues. The exact cause of RA is not known. What increases the risk? You are more likely to develop this condition if you: Are a woman. Have a family history of RA or other autoimmune diseases. Have a history of smoking. Are obese. Have been exposed to pollutants or chemicals. What are the signs or symptoms? The first symptom of this condition may be morning stiffness that lasts longer than 30 minutes. Symptoms usually start gradually. They are often worse in the morning. As RA progresses, symptoms may include: Pain, stiffness, swelling, warmth, and tenderness in joints on both sides of your body. Loss of energy. Loss of appetite. Weight loss. Low-grade fever. Dry eyes and dry mouth. Firm lumps (rheumatoid nodules) that grow beneath your skin in areas such as your forearm bones near your elbows and on your hands. Changes in the appearance of joints (deformity) and loss of joint function. Symptoms of this condition vary from person to person. Symptoms of RA often come and go. Sometimes, symptoms get worse for a period of time. These are called flares. How is this diagnosed? This condition is diagnosed based on your symptoms, medical history, and physical exam. You may have X-rays or an MRI to check for the type of joint changes that are caused by  RA. You may also have blood tests to look for: Proteins (antibodies) that your immune system may make if you have RA. These include rheumatoid factor (RF) and anti-CCP. When blood tests show these proteins, you are said to have "seropositive RA." When blood tests do not show these proteins, you may have "seronegative RA." Inflammation in your blood. A low number of red blood cells (anemia). How is this treated? The goals of treatment are to relieve pain, reduce inflammation, and slow down or stop joint damage and disability. Treatment may include: Lifestyle changes. It is important to rest as needed, eat a healthy diet, and exercise. Medicines. Your health care provider may adjust your medicines every 3 months until treatment goals are reached. Common medicines include: Pain relievers (analgesics). Corticosteroids and NSAIDs to reduce inflammation. Disease-modifying antirheumatic drugs (DMARDs) to try to slow the course of the disease. Biologic response modifiers to reduce inflammation and damage. Physical therapy and occupational therapy. Surgery, if you have severe joint damage. Joint replacement or fusing of joints may be needed. Your health care provider will work with you to identify the best treatmentoption for you based on assessment of the overall disease activity in your body. Follow these instructions at home: Activity Return to your normal activities as told by your health care provider. Ask your health care provider what activities are safe for you. Rest when you are having a flare. Start an exercise program as told by your health care provider. General instructions Keep all follow-up visits as told by your health care provider. This is important. Take over-the-counter and prescription medicines only as  told by your health care provider. Where to find more information [employer] of Rheumatology: www.rheumatology.[place]: www.arthritis.org Contact a  health care provider if: You have a flare-up of RA symptoms. You have a fever. You have side effects from your medicines. Get help right away if: You have chest pain. You have trouble breathing. You quickly develop a hot, painful joint that is more severe than your usual joint aches. Summary Rheumatoid arthritis (RA) is a long-term (chronic) disease that causes inflammation in your joints. RA is an autoimmune disease. The goals of treatment are to relieve pain, reduce inflammation, and slow down or stop joint damage and disability. This information is not intended to replace advice given to you by your health care provider. Make sure you discuss any questions you have with your healthcare provider. Document Revised: 07/12/2018 Document Reviewed: 08/31/2017 Elsevier Patient Education  2022 [employer].

## 2020-07-12 NOTE — Assessment & Plan Note (Signed)
Self swab done

## 2020-07-12 NOTE — Assessment & Plan Note (Signed)
Refer to rheum-- seen on xray Check labs

## 2020-07-13 LAB — RHEUMATOID FACTOR: Rheumatoid fact SerPl-aCnc: <14 IU/mL (ref <14)

## 2020-07-16 ENCOUNTER — Other Ambulatory Visit: Payer: Self-pay

## 2020-07-16 ENCOUNTER — Encounter (INDEPENDENT_AMBULATORY_CARE_PROVIDER_SITE_OTHER): Payer: Self-pay | Admitting: Family Medicine

## 2020-07-16 ENCOUNTER — Telehealth: Payer: Self-pay | Admitting: Family Medicine

## 2020-07-16 DIAGNOSIS — M9902 Segmental and somatic dysfunction of thoracic region: Secondary | ICD-10-CM | POA: Diagnosis not present

## 2020-07-16 DIAGNOSIS — M5451 Vertebrogenic low back pain: Secondary | ICD-10-CM | POA: Diagnosis not present

## 2020-07-16 DIAGNOSIS — M546 Pain in thoracic spine: Secondary | ICD-10-CM | POA: Diagnosis not present

## 2020-07-16 DIAGNOSIS — M9903 Segmental and somatic dysfunction of lumbar region: Secondary | ICD-10-CM | POA: Diagnosis not present

## 2020-07-16 DIAGNOSIS — M5413 Radiculopathy, cervicothoracic region: Secondary | ICD-10-CM | POA: Diagnosis not present

## 2020-07-16 DIAGNOSIS — M9901 Segmental and somatic dysfunction of cervical region: Secondary | ICD-10-CM | POA: Diagnosis not present

## 2020-07-16 LAB — CERVICOVAGINAL ANCILLARY ONLY
Chlamydia: NEGATIVE
Comment: NEGATIVE
Comment: NEGATIVE
Comment: NORMAL
Neisseria Gonorrhea: NEGATIVE
Trichomonas: NEGATIVE

## 2020-07-16 NOTE — Progress Notes (Signed)
Chief Complaint:   OBESITY [name] is here to discuss her progress with her obesity treatment plan along with follow-up of her obesity related diagnoses. [name] is on keeping a food journal and adhering to recommended goals of 1000-1100 calories and 75 protein and practicing portion control and making smarter food choices, such as increasing vegetables and decreasing simple carbohydrates and states she is following her eating plan approximately 75% of the time. [name] states she is not currently exercising.  Today's visit was #: 8 Starting weight: 148 lbs Starting date: 01/23/2020 Today's weight: 144 lbs Today's date: 07/10/2020 Total lbs lost to date: 4 Total lbs lost since last in-office visit: 1  Interim History: [name] notes the Ozempic has really helped with her appetite. Her portion sizes are smaller. She went on vacation and still lost weight. She is doing well with protein intake. She notes calories are less than 1500/day. She is not journaling. She is avoiding bread.  Subjective:   1. Vitamin D deficiency [name]'s Vit D is at goal (67.2). She has been taking prescription vitamin D 50,000 IU each week.   Lab Results  Component Value Date   VD25OH 67.2 06/12/2020   VD25OH 26.6 (L) 01/23/2020   VD25OH 55 09/22/2010   2. Pre-diabetes A1c improved. [name]'s A1c is now 5.7, but previously 5.9. She is on Ozempic.  Lab Results  Component Value Date   HGBA1C 5.7 (H) 06/12/2020   Lab Results  Component Value Date   INSULIN 17.8 06/12/2020   INSULIN 7.3 01/23/2020   Assessment/Plan:   1. Vitamin D deficiency Take Vit D 50,000 IU every week until completing RX, then take OTC Vit D3 5,000 IU daily.  2. Pre-diabetes We will refill Ozempic 0.5 mg, as prescribed below.  - Semaglutide,0.25 or 0.5MG /DOS, (OZEMPIC, 0.25 OR 0.5 MG/DOSE,) 2 MG/1.5ML SOPN; Inject 0.5 mg into the skin once a week.  Dispense: 1.5 mL; Refill: 0  3. Obesity: Current BMI 33.47  [name] is  currently in the action stage of change. As such, her goal is to continue with weight loss efforts. She has agreed to keeping a food journal and adhering to recommended goals of 1000-1100 calories and 75 g protein or practicing portion control and making smarter food choices, such as increasing vegetables and decreasing simple carbohydrates.   Exercise goals: Older adults should follow the adult guidelines. When older adults cannot meet the adult guidelines, they should be as physically active as their abilities and conditions will allow.   Behavioral modification strategies: increasing lean protein intake.  [name] has agreed to follow-up with our clinic in 4 weeks.  Objective:   Blood pressure 113/70, pulse 99, temperature 98.4 F (36.9 C), height 4\' 7"  (1.397 m), weight 144 lb (65.3 kg), SpO2 99 %. Body mass index is 33.47 kg/m.  General: Cooperative, alert, well developed, in no acute distress. HEENT: Conjunctivae and lids unremarkable. Cardiovascular: Regular rhythm.  Lungs: Normal work of breathing. Neurologic: No focal deficits.   Lab Results  Component Value Date   CREATININE 1.12 (H) 06/12/2020   BUN 20 06/12/2020   NA 140 06/12/2020   K 4.2 06/12/2020   CL 104 06/12/2020   CO2 21 06/12/2020   Lab Results  Component Value Date   ALT 31 06/12/2020   AST 27 06/12/2020   ALKPHOS 71 06/12/2020   BILITOT 0.2 06/12/2020   Lab Results  Component Value Date   HGBA1C 5.7 (H) 06/12/2020   HGBA1C 5.9 (H) 01/23/2020   HGBA1C  5.8 04/10/2014   Lab Results  Component Value Date   INSULIN 17.8 06/12/2020   INSULIN 7.3 01/23/2020   Lab Results  Component Value Date   TSH 2.11 09/30/2018   Lab Results  Component Value Date   CHOL 145 01/23/2020   HDL 54 01/23/2020   LDLCALC 77 01/23/2020   LDLDIRECT 131.0 10/01/2016   TRIG 72 01/23/2020   CHOLHDL 3 12/22/2019   Lab Results  Component Value Date   VD25OH 67.2 06/12/2020   VD25OH 26.6 (L) 01/23/2020   VD25OH 55  09/22/2010   Lab Results  Component Value Date   WBC 9.1 06/12/2020   HGB 13.6 06/12/2020   HCT 41.7 06/12/2020   MCV 87 06/12/2020   PLT 354 06/12/2020   No results found for: IRON, TIBC, FERRITIN  Obesity Behavioral Intervention:   Approximately 15 minutes were spent on the discussion below.  ASK: We discussed the diagnosis of obesity with [name] today and [name] agreed to give [place] permission to discuss obesity behavioral modification therapy today.  ASSESS: [name] has the diagnosis of obesity and her BMI today is 33.7. [name] is in the action stage of change.   ADVISE: [name] was educated on the multiple health risks of obesity as well as the benefit of weight loss to improve her health. She was advised of the need for long term treatment and the importance of lifestyle modifications to improve her current health and to decrease her risk of future health problems.  AGREE: Multiple dietary modification options and treatment options were discussed and [name] agreed to follow the recommendations documented in the above note.  ARRANGE: [name] was educated on the importance of frequent visits to treat obesity as outlined per CMS and USPSTF guidelines and agreed to schedule her next follow up appointment today.  Attestation Statements:   Reviewed by clinician on day of visit: allergies, medications, problem list, medical history, surgical history, family history, social history, and previous encounter notes.  Coral Ceo, CMA, am acting as Location manager for [employer], [place].  I have reviewed the above documentation for accuracy and completeness, and I agree with the above. -  [name], FNP

## 2020-07-16 NOTE — Telephone Encounter (Signed)
Mary from [facility] called to inform they are unable to test Hsv & hpv via swab.only in a  thin prep value but not a swab.

## 2020-07-17 NOTE — Telephone Encounter (Signed)
Pt called. LVM to return call 

## 2020-07-19 DIAGNOSIS — M7061 Trochanteric bursitis, right hip: Secondary | ICD-10-CM | POA: Diagnosis not present

## 2020-07-19 DIAGNOSIS — M545 Low back pain, unspecified: Secondary | ICD-10-CM | POA: Diagnosis not present

## 2020-07-29 DIAGNOSIS — M2041 Other hammer toe(s) (acquired), right foot: Secondary | ICD-10-CM | POA: Diagnosis not present

## 2020-07-29 DIAGNOSIS — M7741 Metatarsalgia, right foot: Secondary | ICD-10-CM | POA: Diagnosis not present

## 2020-07-29 DIAGNOSIS — M79671 Pain in right foot: Secondary | ICD-10-CM | POA: Diagnosis not present

## 2020-07-29 DIAGNOSIS — M2031 Hallux varus (acquired), right foot: Secondary | ICD-10-CM | POA: Diagnosis not present

## 2020-07-29 DIAGNOSIS — M2040 Other hammer toe(s) (acquired), unspecified foot: Secondary | ICD-10-CM | POA: Insufficient documentation

## 2020-07-29 DIAGNOSIS — M79672 Pain in left foot: Secondary | ICD-10-CM | POA: Diagnosis not present

## 2020-07-30 DIAGNOSIS — M5416 Radiculopathy, lumbar region: Secondary | ICD-10-CM | POA: Diagnosis not present

## 2020-08-01 DIAGNOSIS — M25511 Pain in right shoulder: Secondary | ICD-10-CM | POA: Diagnosis not present

## 2020-08-05 ENCOUNTER — Ambulatory Visit (INDEPENDENT_AMBULATORY_CARE_PROVIDER_SITE_OTHER): Payer: Medicare HMO | Admitting: Family Medicine

## 2020-08-05 ENCOUNTER — Encounter (INDEPENDENT_AMBULATORY_CARE_PROVIDER_SITE_OTHER): Payer: Self-pay | Admitting: Family Medicine

## 2020-08-05 ENCOUNTER — Other Ambulatory Visit: Payer: Self-pay

## 2020-08-05 VITALS — BP 125/78 | HR 69 | Temp 97.9°F | Ht <= 58 in | Wt 139.0 lb

## 2020-08-05 DIAGNOSIS — E669 Obesity, unspecified: Secondary | ICD-10-CM

## 2020-08-05 DIAGNOSIS — R7303 Prediabetes: Secondary | ICD-10-CM

## 2020-08-05 DIAGNOSIS — Z6834 Body mass index (BMI) 34.0-34.9, adult: Secondary | ICD-10-CM | POA: Diagnosis not present

## 2020-08-05 DIAGNOSIS — I10 Essential (primary) hypertension: Secondary | ICD-10-CM | POA: Diagnosis not present

## 2020-08-05 MED ORDER — OZEMPIC (0.25 OR 0.5 MG/DOSE) 2 MG/1.5ML ~~LOC~~ SOPN: 0.5000 mg | PEN_INJECTOR | SUBCUTANEOUS | 0 refills | Status: DC

## 2020-08-08 NOTE — Progress Notes (Signed)
Chief Complaint:   OBESITY [name] is here to discuss her progress with her obesity treatment plan along with follow-up of her obesity related diagnoses. [name] is on keeping a food journal and adhering to recommended goals of 1000-1100 calories and 75 grams of protein or 1200-1400 calories and 0 grams of protein and states she is following her eating plan approximately 75% of the time. [name] states she is doing 0 minutes 0 times per week.  Today's visit was #: 9 Starting weight: 148 lbs Starting date: 01/23/2020 Today's weight: 139 lbs Today's date: 08/05/2020 Total lbs lost to date: 9 lbs Total lbs lost since last in-office visit: 5 lbs  Interim History: [name] is not tracking her food but having protein with lunch and dinner. She says she has no hunger and struggles to get in protein. Her weight was 154 lbs on Nov. 2021. She had lost 6 lbs before starting our program. Total weight loss is 16 lbs.  Subjective:   1. Pre-diabetes [name]'s last A1C was elevated at 5.7. Her constipation is managed with Linzess. She denies nausea and polyphagia.  Lab Results  Component Value Date   HGBA1C 5.7 (H) 06/12/2020   Lab Results  Component Value Date   INSULIN 17.8 06/12/2020   INSULIN 7.3 01/23/2020    2. Essential hypertension [name]'s hypertension is well controlled with HCTZ.   BP Readings from Last 3 Encounters:  08/05/20 125/78  07/12/20 116/82  07/10/20 113/70     Assessment/Plan:   1. Pre-diabetes We will refill Ozempic 0.5 mg weekly and Kadiatu will continue to work on weight loss, exercise, and decreasing simple carbohydrates to help decrease the risk of diabetes.   - Semaglutide,0.25 or 0.'5MG'$ /DOS, (OZEMPIC, 0.25 OR 0.5 MG/DOSE,) 2 MG/1.5ML SOPN; Inject 0.5 mg into the skin once a week.  Dispense: 1.5 mL; Refill: 0  2. Essential hypertension We will refill HCTZ and [name] continue working on healthy weight loss and exercise to improve blood pressure  control. We will watch for signs of hypotension as she continues her lifestyle modifications.   3. Obesity: Current BMI 32.31 [name] is currently in the action stage of change. As such, her goal is to continue with weight loss efforts. She has agreed to keeping a food journal and adhering to recommended goals of 1000-1100 calories and 75 grams daily of protein and practicing portion control and making smarter food choices, such as increasing vegetables and decreasing simple carbohydrates.   Handout: Protein contents of food. May have one protein shake per day.  Exercise goals: No exercise has been prescribed at this time. 70 grams of protein per day.  Behavioral modification strategies: increasing lean protein intake and meal planning and cooking strategies.  [name] has agreed to follow-up with our clinic in 3 weeks. Objective:   Blood pressure 125/78, pulse 69, temperature 97.9 F (36.6 C), height '4\' 7"'$  (1.397 m), weight 139 lb (63 kg), SpO2 96 %. Body mass index is 32.31 kg/m.  General: Cooperative, alert, well developed, in no acute distress. HEENT: Conjunctivae and lids unremarkable. Cardiovascular: Regular rhythm.  Lungs: Normal work of breathing. Neurologic: No focal deficits.   Lab Results  Component Value Date   CREATININE 1.12 (H) 06/12/2020   BUN 20 06/12/2020   NA 140 06/12/2020   K 4.2 06/12/2020   CL 104 06/12/2020   CO2 21 06/12/2020   Lab Results  Component Value Date   ALT 31 06/12/2020   AST 27 06/12/2020   ALKPHOS 71 06/12/2020  BILITOT 0.2 06/12/2020   Lab Results  Component Value Date   HGBA1C 5.7 (H) 06/12/2020   HGBA1C 5.9 (H) 01/23/2020   HGBA1C 5.8 04/10/2014   Lab Results  Component Value Date   INSULIN 17.8 06/12/2020   INSULIN 7.3 01/23/2020   Lab Results  Component Value Date   TSH 2.11 09/30/2018   Lab Results  Component Value Date   CHOL 145 01/23/2020   HDL 54 01/23/2020   LDLCALC 77 01/23/2020   LDLDIRECT 131.0  10/01/2016   TRIG 72 01/23/2020   CHOLHDL 3 12/22/2019   Lab Results  Component Value Date   VD25OH 67.2 06/12/2020   VD25OH 26.6 (L) 01/23/2020   VD25OH 55 09/22/2010   Lab Results  Component Value Date   WBC 9.1 06/12/2020   HGB 13.6 06/12/2020   HCT 41.7 06/12/2020   MCV 87 06/12/2020   PLT 354 06/12/2020   No results found for: IRON, TIBC, FERRITIN  Obesity Behavioral Intervention:   Approximately 15 minutes were spent on the discussion below.  ASK: We discussed the diagnosis of obesity with [name] today and [name] agreed to give [place] permission to discuss obesity behavioral modification therapy today.  ASSESS: [name] has the diagnosis of obesity and her BMI today is 32.5. [name] is in the action stage of change.   ADVISE: [name] was educated on the multiple health risks of obesity as well as the benefit of weight loss to improve her health. She was advised of the need for long term treatment and the importance of lifestyle modifications to improve her current health and to decrease her risk of future health problems.  AGREE: Multiple dietary modification options and treatment options were discussed and [name] agreed to follow the recommendations documented in the above note.  ARRANGE: [name] was educated on the importance of frequent visits to treat obesity as outlined per CMS and USPSTF guidelines and agreed to schedule her next follow up appointment today.  Attestation Statements:   Reviewed by clinician on day of visit: allergies, medications, problem list, medical history, surgical history, family history, social history, and previous encounter notes.  I, Lizbeth Bark, RMA, am acting as Location manager for [employer], [place].   I have reviewed the above documentation for accuracy and completeness, and I agree with the above. -  [name], FNP

## 2020-08-12 ENCOUNTER — Other Ambulatory Visit: Payer: Self-pay | Admitting: Family Medicine

## 2020-08-12 DIAGNOSIS — S134XXA Sprain of ligaments of cervical spine, initial encounter: Secondary | ICD-10-CM

## 2020-08-12 DIAGNOSIS — K219 Gastro-esophageal reflux disease without esophagitis: Secondary | ICD-10-CM

## 2020-08-19 ENCOUNTER — Encounter (INDEPENDENT_AMBULATORY_CARE_PROVIDER_SITE_OTHER): Payer: Self-pay | Admitting: Family Medicine

## 2020-08-20 ENCOUNTER — Ambulatory Visit (INDEPENDENT_AMBULATORY_CARE_PROVIDER_SITE_OTHER): Payer: Medicare HMO | Admitting: Pharmacist

## 2020-08-20 DIAGNOSIS — M859 Disorder of bone density and structure, unspecified: Secondary | ICD-10-CM

## 2020-08-20 DIAGNOSIS — M8580 Other specified disorders of bone density and structure, unspecified site: Secondary | ICD-10-CM

## 2020-08-20 DIAGNOSIS — I10 Essential (primary) hypertension: Secondary | ICD-10-CM | POA: Diagnosis not present

## 2020-08-20 DIAGNOSIS — Z6834 Body mass index (BMI) 34.0-34.9, adult: Secondary | ICD-10-CM

## 2020-08-20 DIAGNOSIS — R7303 Prediabetes: Secondary | ICD-10-CM

## 2020-08-20 DIAGNOSIS — F321 Major depressive disorder, single episode, moderate: Secondary | ICD-10-CM | POA: Diagnosis not present

## 2020-08-20 DIAGNOSIS — E669 Obesity, unspecified: Secondary | ICD-10-CM

## 2020-08-20 DIAGNOSIS — E782 Mixed hyperlipidemia: Secondary | ICD-10-CM | POA: Diagnosis not present
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
## 2020-08-23 NOTE — Patient Instructions (Signed)
Visit Information  PATIENT GOALS:  Goals Addressed             This Visit's Progress    Chronic Care Mangement Pharmacy Care Plan   On track    [place] (see longitudinal plan of care for additional care plan information)  Current Barriers:  Chronic Disease Management support, education, and care coordination needs related to Asthma, Pre-Diabetes, Hypertension, Hyperlipidemia, RLS/Neuropathy, Pain, Insomnia, Depression, GERD, Osteopenia, Allergic Rhrinitis    Hypertension BP Readings from Last 3 Encounters:  08/05/20 125/78  07/12/20 116/82  07/10/20 113/70  Pharmacist Clinical Goal(s): Over the next 90 days, patient will work with PharmD and providers to maintain BP goal <140/90 Current regimen:  Hydrochlorothiazide '25mg'$  daily  Patient self care activities - Over the next 90 days, patient will: Ensure daily salt intake < 2300 mg/day Maintain hypertension medication regimen.  Continue to check BP at least weekly and record.  Encouraged exercise program / walking with goal to increase as able to 150 minutes per week;   Hyperlipidemia Lab Results  Component Value Date/Time   LDLCALC 77 01/23/2020 10:35 AM   LDLCALC 128 (H) 09/30/2018 03:26 PM   LDLDIRECT 131.0 10/01/2016 03:49 PM  Pharmacist Clinical Goal(s): Over the next 90 days, patient will work with PharmD and providers to maintain LDL goal < 100 Current regimen:  Fenofibrate '160mg'$  daily Patient self care activities - Over the next 90 days, patient will: Maintain cholesterol medication regimen.   Pre-Diabetes with neuropathy Lab Results  Component Value Date/Time   HGBA1C 5.7 (H) 06/12/2020 11:34 AM   HGBA1C 5.9 (H) 01/23/2020 10:35 AM  Pharmacist Clinical Goal(s): Over the next 90 days, patient will work with PharmD and providers to maintain A1c goal <6.5% Current regimen:  Pregabalin '50mg'$  - take 2 capsules twice a day as needed Ozempic 0.'5mg'$  subcutaneously once per week Patient self care activities -  Over the next 90 days, patient will: Limit carbohydrate intake (30-45 grams per meal) Reviewed A1c and blood sugar goals; Encouraged exercise program / walking with goal to increase as able to 150 minutes per week;  Depression Pharmacist Clinical Goal(s): Over the next 90 days, patient will work with PharmD and providers to reduce symptoms of depression/anxiety Current regimen:  Abilify '2mg'$  daily (currently taking during waking hours)  Escitalopram '20mg'$  daily (currently taking about 2 hours before sleep) Zoplidem '10mg'$  - take 0.5 tablet = '5mg'$  at night, if awakes at night will take other half tablet to equal total dose of '10mg'$  Patient self care activities - Over the next 90 days, patient will: Recommend taking Ability at time of sleep to see if this helps with sleep and changing escitalopram to waking hours. Escitalopram can be taken any time of day  Osteopenia:  Pharmacist Clinical Goal(s) Prevent falls and fracture Current regimen:  Alendronate '70mg'$  - take 1 tablet weekly  Vitamin D 2000 IU daily  Patient self care activities - Over the next 90 days, patient will: Continue current therapy Consider rechecking DEXA in 2023   Medication management Pharmacist Clinical Goal(s): Over the next 90 days, patient will work with PharmD and providers to maintain optimal medication adherence Current pharmacy: [facility]  Interventions Comprehensive medication review performed. Continue current medication management strategy Reviewed patient's current prescription formulary. Hyoscyamine is not included in her formulary. I did checked GoodRx and she could get for about $10 per 30 tablets by using GoodRx card.  Patient self care activities - Over the next 90 days, patient will: Focus  on medication adherence by filling medications appropriately  Take medications as prescribed Report any questions or concerns to PharmD and/or provider(s)   Please see past updates related to this  goal by clicking on the "Past Updates" button in the selected goal          Patient verbalizes understanding of instructions provided today and agrees to view in [place].   Telephone follow up appointment with care management team member scheduled for: 3 months  [name], PharmD Clinical Pharmacist [facility]

## 2020-08-26 ENCOUNTER — Encounter (INDEPENDENT_AMBULATORY_CARE_PROVIDER_SITE_OTHER): Payer: Self-pay | Admitting: Family Medicine

## 2020-08-26 ENCOUNTER — Ambulatory Visit (INDEPENDENT_AMBULATORY_CARE_PROVIDER_SITE_OTHER): Payer: Medicare HMO | Admitting: Family Medicine

## 2020-08-26 ENCOUNTER — Other Ambulatory Visit: Payer: Self-pay

## 2020-08-26 VITALS — BP 125/72 | HR 80 | Temp 97.6°F | Ht <= 58 in | Wt 141.0 lb

## 2020-08-26 DIAGNOSIS — R7303 Prediabetes: Secondary | ICD-10-CM | POA: Diagnosis not present

## 2020-08-26 DIAGNOSIS — M9902 Segmental and somatic dysfunction of thoracic region: Secondary | ICD-10-CM | POA: Diagnosis not present

## 2020-08-26 DIAGNOSIS — M546 Pain in thoracic spine: Secondary | ICD-10-CM | POA: Diagnosis not present

## 2020-08-26 DIAGNOSIS — I10 Essential (primary) hypertension: Secondary | ICD-10-CM

## 2020-08-26 DIAGNOSIS — M5451 Vertebrogenic low back pain: Secondary | ICD-10-CM | POA: Diagnosis not present

## 2020-08-26 DIAGNOSIS — M9901 Segmental and somatic dysfunction of cervical region: Secondary | ICD-10-CM | POA: Diagnosis not present

## 2020-08-26 DIAGNOSIS — Z6834 Body mass index (BMI) 34.0-34.9, adult: Secondary | ICD-10-CM | POA: Diagnosis not present

## 2020-08-26 DIAGNOSIS — M5413 Radiculopathy, cervicothoracic region: Secondary | ICD-10-CM | POA: Diagnosis not present

## 2020-08-26 DIAGNOSIS — E669 Obesity, unspecified: Secondary | ICD-10-CM | POA: Diagnosis not present

## 2020-08-26 DIAGNOSIS — M9903 Segmental and somatic dysfunction of lumbar region: Secondary | ICD-10-CM | POA: Diagnosis not present

## 2020-08-26 MED ORDER — OZEMPIC (0.25 OR 0.5 MG/DOSE) 2 MG/1.5ML ~~LOC~~ SOPN: 0.5000 mg | PEN_INJECTOR | SUBCUTANEOUS | 0 refills | Status: DC

## 2020-08-26 NOTE — Progress Notes (Signed)
Chief Complaint:   OBESITY [name] is here to discuss her progress with her obesity treatment plan along with follow-up of her obesity related diagnoses. [name] is on keeping a food journal and adhering to recommended goals of 1000-1100 calories and 75 grams of protein and practicing portion control and making smarter food choices, such as increasing vegetables and decreasing simple carbohydrates and states she is following her eating plan approximately 75% of the time. [name] states she is doing 0 minutes 0 times per week.  Today's visit was #: 10 Starting weight: 148 lbs Starting date: 01/23/2020 Today's weight: 141 lbs Today's date: 08/26/2020 Total lbs lost to date: 7 lbs Total lbs lost since last in-office visit: +2  Interim History: [name]  has been eating out more the past 2 weeks. She feels she has been a bit of depressed and has not felt like not cooking. She often buys a meal out and then divides this into 2-3 meals which would contribute to inadequate protein intake.  Subjective:   1. Pre-diabetes [name]'s last A1C was 5.7 mg. Her appetite is well controlled on Ozempic 0.50 mg.  Lab Results  Component Value Date   HGBA1C 5.7 (H) 06/12/2020   Lab Results  Component Value Date   INSULIN 17.8 06/12/2020   INSULIN 7.3 01/23/2020    2. Essential hypertension [name]'s hypertension is well controlled on Losartan.  BP Readings from Last 3 Encounters:  08/26/20 125/72  08/05/20 125/78  07/12/20 116/82     Assessment/Plan:   1. Pre-diabetes  We will refill Ozempic 0.5 mg weekly.  - Semaglutide,0.25 or 0.'5MG'$ /DOS, (OZEMPIC, 0.25 OR 0.5 MG/DOSE,) 2 MG/1.5ML SOPN; Inject 0.5 mg into the skin once a week.  Dispense: 1.5 mL; Refill: 0  2. Essential hypertension [name] will continue Losartan.   3. Obesity: Current BMI 32.77 [name] is currently in the action stage of change. As such, her goal is to continue with weight loss efforts. She has agreed to keeping a  food journal and adhering to recommended goals of 1000-1100 calories and 75 grams of protein daily and practicing portion control and making smarter food choices, such as increasing vegetables and decreasing simple carbohydrates.   Handouts: recipes, My Fitness Pal.  Exercise goals: No exercise has been prescribed at this time.  Behavioral modification strategies: increasing lean protein intake, meal planning and cooking strategies, and keeping a strict food journal.  [name] has agreed to follow-up with our clinic in 3 weeks. She was informed of the importance of frequent follow-up visits to maximize her success with intensive lifestyle modifications for her multiple health conditions.   Objective:   Blood pressure 125/72, pulse 80, temperature 97.6 F (36.4 C), height '4\' 7"'$  (1.397 m), weight 141 lb (64 kg), SpO2 92 %. Body mass index is 32.77 kg/m.  General: Cooperative, alert, well developed, in no acute distress. HEENT: Conjunctivae and lids unremarkable. Cardiovascular: Regular rhythm.  Lungs: Normal work of breathing. Neurologic: No focal deficits.   Lab Results  Component Value Date   CREATININE 1.12 (H) 06/12/2020   BUN 20 06/12/2020   NA 140 06/12/2020   K 4.2 06/12/2020   CL 104 06/12/2020   CO2 21 06/12/2020   Lab Results  Component Value Date   ALT 31 06/12/2020   AST 27 06/12/2020   ALKPHOS 71 06/12/2020   BILITOT 0.2 06/12/2020   Lab Results  Component Value Date   HGBA1C 5.7 (H) 06/12/2020   HGBA1C 5.9 (H) 01/23/2020   HGBA1C 5.8  04/10/2014   Lab Results  Component Value Date   INSULIN 17.8 06/12/2020   INSULIN 7.3 01/23/2020   Lab Results  Component Value Date   TSH 2.11 09/30/2018   Lab Results  Component Value Date   CHOL 145 01/23/2020   HDL 54 01/23/2020   LDLCALC 77 01/23/2020   LDLDIRECT 131.0 10/01/2016   TRIG 72 01/23/2020   CHOLHDL 3 12/22/2019   Lab Results  Component Value Date   VD25OH 67.2 06/12/2020   VD25OH 26.6 (L)  01/23/2020   VD25OH 55 09/22/2010   Lab Results  Component Value Date   WBC 9.1 06/12/2020   HGB 13.6 06/12/2020   HCT 41.7 06/12/2020   MCV 87 06/12/2020   PLT 354 06/12/2020   No results found for: IRON, TIBC, FERRITIN  Obesity Behavioral Intervention:   Approximately 15 minutes were spent on the discussion below.  ASK: We discussed the diagnosis of obesity with [name] today and [name] agreed to give [place] permission to discuss obesity behavioral modification therapy today.  ASSESS: [name] has the diagnosis of obesity and her BMI today is 32.8. [name] is in the action stage of change.   ADVISE: [name] was educated on the multiple health risks of obesity as well as the benefit of weight loss to improve her health. She was advised of the need for long term treatment and the importance of lifestyle modifications to improve her current health and to decrease her risk of future health problems.  AGREE: Multiple dietary modification options and treatment options were discussed and [name] agreed to follow the recommendations documented in the above note.  ARRANGE: [name] was educated on the importance of frequent visits to treat obesity as outlined per CMS and USPSTF guidelines and agreed to schedule her next follow up appointment today.  Attestation Statements:   Reviewed by clinician on day of visit: allergies, medications, problem list, medical history, surgical history, family history, social history, and previous encounter notes.  I, Lizbeth Bark, RMA, am acting as Location manager for [employer], [place].   I have reviewed the above documentation for accuracy and completeness, and I agree with the above. -  [name], FNP

## 2020-08-27 ENCOUNTER — Encounter (INDEPENDENT_AMBULATORY_CARE_PROVIDER_SITE_OTHER): Payer: Self-pay | Admitting: Family Medicine

## 2020-09-02 DIAGNOSIS — Z96653 Presence of artificial knee joint, bilateral: Secondary | ICD-10-CM | POA: Diagnosis not present

## 2020-09-02 DIAGNOSIS — M7061 Trochanteric bursitis, right hip: Secondary | ICD-10-CM | POA: Diagnosis not present

## 2020-09-06 DIAGNOSIS — M9901 Segmental and somatic dysfunction of cervical region: Secondary | ICD-10-CM | POA: Diagnosis not present

## 2020-09-06 DIAGNOSIS — M5451 Vertebrogenic low back pain: Secondary | ICD-10-CM | POA: Diagnosis not present

## 2020-09-06 DIAGNOSIS — M9903 Segmental and somatic dysfunction of lumbar region: Secondary | ICD-10-CM | POA: Diagnosis not present

## 2020-09-06 DIAGNOSIS — M5413 Radiculopathy, cervicothoracic region: Secondary | ICD-10-CM | POA: Diagnosis not present

## 2020-09-06 DIAGNOSIS — M546 Pain in thoracic spine: Secondary | ICD-10-CM | POA: Diagnosis not present

## 2020-09-06 DIAGNOSIS — M9902 Segmental and somatic dysfunction of thoracic region: Secondary | ICD-10-CM | POA: Diagnosis not present

## 2020-09-11 ENCOUNTER — Encounter (INDEPENDENT_AMBULATORY_CARE_PROVIDER_SITE_OTHER): Payer: Self-pay | Admitting: Family Medicine

## 2020-09-11 NOTE — Telephone Encounter (Signed)
Please review and advise.

## 2020-09-14 ENCOUNTER — Encounter (INDEPENDENT_AMBULATORY_CARE_PROVIDER_SITE_OTHER): Payer: Self-pay | Admitting: Family Medicine

## 2020-09-15 DIAGNOSIS — M7061 Trochanteric bursitis, right hip: Secondary | ICD-10-CM | POA: Insufficient documentation

## 2020-09-18 ENCOUNTER — Ambulatory Visit (INDEPENDENT_AMBULATORY_CARE_PROVIDER_SITE_OTHER): Payer: Medicare HMO | Admitting: Family Medicine

## 2020-09-21 DIAGNOSIS — M5416 Radiculopathy, lumbar region: Secondary | ICD-10-CM | POA: Diagnosis not present

## 2020-09-26 DIAGNOSIS — M5416 Radiculopathy, lumbar region: Secondary | ICD-10-CM | POA: Diagnosis not present

## 2020-10-10 ENCOUNTER — Encounter (INDEPENDENT_AMBULATORY_CARE_PROVIDER_SITE_OTHER): Payer: Self-pay | Admitting: Family Medicine

## 2020-10-23 DIAGNOSIS — M5416 Radiculopathy, lumbar region: Secondary | ICD-10-CM | POA: Diagnosis not present

## 2020-10-24 ENCOUNTER — Other Ambulatory Visit: Payer: Self-pay | Admitting: Family Medicine

## 2020-10-24 DIAGNOSIS — F411 Generalized anxiety disorder: Secondary | ICD-10-CM

## 2020-10-24 DIAGNOSIS — F418 Other specified anxiety disorders: Secondary | ICD-10-CM

## 2020-11-04 ENCOUNTER — Other Ambulatory Visit: Payer: Self-pay

## 2020-11-04 ENCOUNTER — Telehealth (INDEPENDENT_AMBULATORY_CARE_PROVIDER_SITE_OTHER): Payer: Medicare HMO | Admitting: Family Medicine

## 2020-11-04 DIAGNOSIS — M5451 Vertebrogenic low back pain: Secondary | ICD-10-CM | POA: Diagnosis not present

## 2020-11-04 DIAGNOSIS — M5432 Sciatica, left side: Secondary | ICD-10-CM | POA: Diagnosis not present

## 2020-11-04 DIAGNOSIS — Z6834 Body mass index (BMI) 34.0-34.9, adult: Secondary | ICD-10-CM | POA: Diagnosis not present

## 2020-11-04 DIAGNOSIS — M5431 Sciatica, right side: Secondary | ICD-10-CM

## 2020-11-04 DIAGNOSIS — R7303 Prediabetes: Secondary | ICD-10-CM | POA: Diagnosis not present

## 2020-11-04 DIAGNOSIS — E669 Obesity, unspecified: Secondary | ICD-10-CM | POA: Diagnosis not present

## 2020-11-04 DIAGNOSIS — M9902 Segmental and somatic dysfunction of thoracic region: Secondary | ICD-10-CM | POA: Diagnosis not present

## 2020-11-04 DIAGNOSIS — M9901 Segmental and somatic dysfunction of cervical region: Secondary | ICD-10-CM | POA: Diagnosis not present

## 2020-11-04 DIAGNOSIS — M546 Pain in thoracic spine: Secondary | ICD-10-CM | POA: Diagnosis not present

## 2020-11-04 DIAGNOSIS — M9903 Segmental and somatic dysfunction of lumbar region: Secondary | ICD-10-CM | POA: Diagnosis not present

## 2020-11-04 DIAGNOSIS — M5413 Radiculopathy, cervicothoracic region: Secondary | ICD-10-CM | POA: Diagnosis not present

## 2020-11-04 MED ORDER — OZEMPIC (0.25 OR 0.5 MG/DOSE) 2 MG/1.5ML ~~LOC~~ SOPN: 0.5000 mg | PEN_INJECTOR | SUBCUTANEOUS | 0 refills | Status: DC

## 2020-11-05 ENCOUNTER — Emergency Department: Payer: Medicare HMO

## 2020-11-05 ENCOUNTER — Other Ambulatory Visit: Payer: Self-pay

## 2020-11-05 ENCOUNTER — Ambulatory Visit: Payer: Medicare HMO | Admitting: Family Medicine

## 2020-11-05 ENCOUNTER — Encounter (INDEPENDENT_AMBULATORY_CARE_PROVIDER_SITE_OTHER): Payer: Self-pay | Admitting: Family Medicine

## 2020-11-05 ENCOUNTER — Encounter: Payer: Self-pay | Admitting: Family Medicine

## 2020-11-05 ENCOUNTER — Emergency Department
Admission: EM | Admit: 2020-11-05 | Discharge: 2020-11-05 | Disposition: A | Discharge status: Home / Self Care | Payer: Medicare HMO | Source: Home / Self Care | Attending: Emergency Medicine | Admitting: Emergency Medicine

## 2020-11-05 DIAGNOSIS — R262 Difficulty in walking, not elsewhere classified: Secondary | ICD-10-CM | POA: Diagnosis not present

## 2020-11-05 DIAGNOSIS — M5116 Intervertebral disc disorders with radiculopathy, lumbar region: Secondary | ICD-10-CM | POA: Diagnosis not present

## 2020-11-05 DIAGNOSIS — K219 Gastro-esophageal reflux disease without esophagitis: Secondary | ICD-10-CM | POA: Diagnosis present

## 2020-11-05 DIAGNOSIS — Z87891 Personal history of nicotine dependence: Secondary | ICD-10-CM | POA: Diagnosis not present

## 2020-11-05 DIAGNOSIS — Z79899 Other long term (current) drug therapy: Secondary | ICD-10-CM | POA: Insufficient documentation

## 2020-11-05 DIAGNOSIS — F32A Depression, unspecified: Secondary | ICD-10-CM | POA: Diagnosis not present

## 2020-11-05 DIAGNOSIS — M545 Low back pain, unspecified: Secondary | ICD-10-CM | POA: Diagnosis not present

## 2020-11-05 DIAGNOSIS — M797 Fibromyalgia: Secondary | ICD-10-CM | POA: Diagnosis present

## 2020-11-05 DIAGNOSIS — M4807 Spinal stenosis, lumbosacral region: Secondary | ICD-10-CM | POA: Diagnosis present

## 2020-11-05 DIAGNOSIS — J45909 Unspecified asthma, uncomplicated: Secondary | ICD-10-CM | POA: Insufficient documentation

## 2020-11-05 DIAGNOSIS — F3289 Other specified depressive episodes: Secondary | ICD-10-CM | POA: Diagnosis not present

## 2020-11-05 DIAGNOSIS — F319 Bipolar disorder, unspecified: Secondary | ICD-10-CM | POA: Diagnosis not present

## 2020-11-05 DIAGNOSIS — M48061 Spinal stenosis, lumbar region without neurogenic claudication: Secondary | ICD-10-CM | POA: Diagnosis not present

## 2020-11-05 DIAGNOSIS — M5441 Lumbago with sciatica, right side: Secondary | ICD-10-CM

## 2020-11-05 DIAGNOSIS — W010XXA Fall on same level from slipping, tripping and stumbling without subsequent striking against object, initial encounter: Secondary | ICD-10-CM | POA: Insufficient documentation

## 2020-11-05 DIAGNOSIS — M810 Age-related osteoporosis without current pathological fracture: Secondary | ICD-10-CM | POA: Diagnosis present

## 2020-11-05 DIAGNOSIS — E785 Hyperlipidemia, unspecified: Secondary | ICD-10-CM | POA: Insufficient documentation

## 2020-11-05 DIAGNOSIS — M25551 Pain in right hip: Secondary | ICD-10-CM | POA: Diagnosis not present

## 2020-11-05 DIAGNOSIS — G834 Cauda equina syndrome: Secondary | ICD-10-CM | POA: Diagnosis not present

## 2020-11-05 DIAGNOSIS — E1169 Type 2 diabetes mellitus with other specified complication: Secondary | ICD-10-CM | POA: Insufficient documentation

## 2020-11-05 DIAGNOSIS — M069 Rheumatoid arthritis, unspecified: Secondary | ICD-10-CM | POA: Diagnosis not present

## 2020-11-05 DIAGNOSIS — Z801 Family history of malignant neoplasm of trachea, bronchus and lung: Secondary | ICD-10-CM | POA: Diagnosis not present

## 2020-11-05 DIAGNOSIS — Z8616 Personal history of COVID-19: Secondary | ICD-10-CM | POA: Insufficient documentation

## 2020-11-05 DIAGNOSIS — I10 Essential (primary) hypertension: Secondary | ICD-10-CM | POA: Insufficient documentation

## 2020-11-05 DIAGNOSIS — G8929 Other chronic pain: Secondary | ICD-10-CM | POA: Insufficient documentation

## 2020-11-05 DIAGNOSIS — M5442 Lumbago with sciatica, left side: Secondary | ICD-10-CM | POA: Insufficient documentation

## 2020-11-05 DIAGNOSIS — K581 Irritable bowel syndrome with constipation: Secondary | ICD-10-CM | POA: Diagnosis present

## 2020-11-05 DIAGNOSIS — Z8249 Family history of ischemic heart disease and other diseases of the circulatory system: Secondary | ICD-10-CM | POA: Diagnosis not present

## 2020-11-05 DIAGNOSIS — I959 Hypotension, unspecified: Secondary | ICD-10-CM | POA: Diagnosis not present

## 2020-11-05 DIAGNOSIS — Z96653 Presence of artificial knee joint, bilateral: Secondary | ICD-10-CM | POA: Insufficient documentation

## 2020-11-05 DIAGNOSIS — M47816 Spondylosis without myelopathy or radiculopathy, lumbar region: Secondary | ICD-10-CM | POA: Diagnosis present

## 2020-11-05 DIAGNOSIS — Z20822 Contact with and (suspected) exposure to covid-19: Secondary | ICD-10-CM | POA: Diagnosis not present

## 2020-11-05 DIAGNOSIS — M25552 Pain in left hip: Secondary | ICD-10-CM | POA: Diagnosis not present

## 2020-11-05 DIAGNOSIS — J449 Chronic obstructive pulmonary disease, unspecified: Secondary | ICD-10-CM | POA: Insufficient documentation

## 2020-11-05 DIAGNOSIS — Z90710 Acquired absence of both cervix and uterus: Secondary | ICD-10-CM | POA: Diagnosis not present

## 2020-11-05 MED ORDER — ORPHENADRINE CITRATE 30 MG/ML IJ SOLN
60.0000 mg | Freq: Once | INTRAMUSCULAR | Status: AC
  Administered 2020-11-05: 60 mg via INTRAMUSCULAR
  Filled 2020-11-05: qty 2

## 2020-11-05 MED ORDER — ONDANSETRON 8 MG PO TBDP
8.0000 mg | ORAL_TABLET | Freq: Once | ORAL | Status: DC
Start: 2020-11-05 — End: 2020-11-05
  Filled 2020-11-05: qty 1

## 2020-11-05 MED ORDER — KETOROLAC TROMETHAMINE 30 MG/ML IJ SOLN
15.0000 mg | Freq: Once | INTRAMUSCULAR | Status: AC
  Administered 2020-11-05: 15 mg via INTRAMUSCULAR
  Filled 2020-11-05: qty 1

## 2020-11-05 MED ORDER — METHOCARBAMOL 500 MG PO TABS: 500.0000 mg | ORAL_TABLET | Freq: Four times a day (QID) | ORAL | 0 refills | Status: DC

## 2020-11-05 MED ORDER — DEXAMETHASONE SODIUM PHOSPHATE 10 MG/ML IJ SOLN
10.0000 mg | Freq: Once | INTRAMUSCULAR | Status: AC
  Administered 2020-11-05: 10 mg via INTRAMUSCULAR
  Filled 2020-11-05: qty 1

## 2020-11-05 MED ORDER — PREDNISONE 10 MG PO TABS: 10.0000 mg | ORAL_TABLET | ORAL | 0 refills | Status: DC

## 2020-11-05 MED ORDER — HYDROCODONE-ACETAMINOPHEN 5-325 MG PO TABS
1.0000 | ORAL_TABLET | Freq: Once | ORAL | Status: AC
  Administered 2020-11-05: 1 via ORAL
  Filled 2020-11-05: qty 1

## 2020-11-05 MED ORDER — MELOXICAM 15 MG PO TABS: 15.0000 mg | ORAL_TABLET | Freq: Every day | ORAL | 0 refills | Status: DC
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
## 2020-11-05 NOTE — ED Notes (Signed)
Patient returns from radiology.

## 2020-11-05 NOTE — ED Notes (Addendum)
Patient transported to MRI 

## 2020-11-05 NOTE — Progress Notes (Signed)
TeleHealth Visit:  Due to the COVID-19 pandemic, this visit was completed with telemedicine (audio/video) technology to reduce patient and provider exposure as well as to preserve personal protective equipment.   [name] has verbally consented to this TeleHealth visit. The patient is located at home, the provider is located at the [facility] office. The participants in this visit include the listed provider and patient. The visit was conducted today via telephone.  [name] was unable to use realtime audiovisual technology today and the telehealth visit was conducted via telephone.  Chief Complaint: OBESITY [name] is here to discuss her progress with her obesity treatment plan along with follow-up of her obesity related diagnoses. [name] is on keeping a food journal and adhering to recommended goals of 1000-1100 calories and 75 grams of protein and states she is following her eating plan approximately 0% of the time. [name] states she is not exercising regularly at this time.  Today's visit was #: 11 Starting weight: 148 lbs Starting date: 01/23/2020  Interim History: [name] has been on prednisone and notes she has gained weight.  She has been having severe back issues that affect her leg mobility.  She is up to 153 pounds, However, she says she is constipated.  She is not journaling.  She is not skipping meals.  Subjective:   1. Prediabetes She is in the donut hole and unable to afford Ozempic.  However, someone gave her 2 boxes of Ozempic so she has continued it at 0.5 mg weekly.  She endorses constipation.  Lab Results  Component Value Date   HGBA1C 5.7 (H) 06/12/2020   Lab Results  Component Value Date   INSULIN 17.8 06/12/2020   INSULIN 7.3 01/23/2020   2. Bilateral sciatica Notes inability to lift legs up on her own.  Endorses numbness/tingling.  Back pain is minimal, but hips feel like they are on "fire".  Denies bladder/bowel  incontinence.  Assessment/Plan:   1. Prediabetes Continue Ozempic at 0.5 mg weekly.  - Continue Semaglutide,0.25 or 0.5MG /DOS, (OZEMPIC, 0.25 OR 0.5 MG/DOSE,) 2 MG/1.5ML SOPN; Inject 0.5 mg into the skin once a week.  Dispense: 1.5 mL; Refill: 0  2. Bilateral sciatica She will see her PCP tomorrow for evaluation.  3. Obesity: Current BMI 32.77  [name] is currently in the action stage of change. As such, her goal is to continue with weight loss efforts. She has agreed to practicing portion control and making smarter food choices, such as increasing vegetables and decreasing simple carbohydrates.   She will have protein at all meals. I have switched her to portion control/smart choices because she does not journal.   Exercise goals: No exercise has been prescribed at this time.  Behavioral modification strategies: increasing lean protein intake and decreasing simple carbohydrates.  [name] has agreed to follow-up with our clinic in 4 weeks.   Objective:   VITALS: Per patient if applicable, see vitals. GENERAL: Alert and in no acute distress. CARDIOPULMONARY: No increased WOB. Speaking in clear sentences.  PSYCH: Pleasant and cooperative. Speech normal rate and rhythm. Affect is appropriate. Insight and judgement are appropriate. Attention is focused, linear, and appropriate.  NEURO: Oriented as arrived to appointment on time with no prompting.   Lab Results  Component Value Date   CREATININE 1.12 (H) 06/12/2020   BUN 20 06/12/2020   NA 140 06/12/2020   K 4.2 06/12/2020   CL 104 06/12/2020   CO2 21 06/12/2020   Lab Results  Component Value Date  ALT 31 06/12/2020   AST 27 06/12/2020   ALKPHOS 71 06/12/2020   BILITOT 0.2 06/12/2020   Lab Results  Component Value Date   HGBA1C 5.7 (H) 06/12/2020   HGBA1C 5.9 (H) 01/23/2020   HGBA1C 5.8 04/10/2014   Lab Results  Component Value Date   INSULIN 17.8 06/12/2020   INSULIN 7.3 01/23/2020   Lab Results  Component  Value Date   TSH 2.11 09/30/2018   Lab Results  Component Value Date   CHOL 145 01/23/2020   HDL 54 01/23/2020   LDLCALC 77 01/23/2020   LDLDIRECT 131.0 10/01/2016   TRIG 72 01/23/2020   CHOLHDL 3 12/22/2019   Lab Results  Component Value Date   VD25OH 67.2 06/12/2020   VD25OH 26.6 (L) 01/23/2020   VD25OH 55 09/22/2010   Lab Results  Component Value Date   WBC 9.1 06/12/2020   HGB 13.6 06/12/2020   HCT 41.7 06/12/2020   MCV 87 06/12/2020   PLT 354 06/12/2020   Attestation Statements:   Reviewed by clinician on day of visit: allergies, medications, problem list, medical history, surgical history, family history, social history, and previous encounter notes.  Time spent on visit including pre-visit chart review and post-visit charting and care was 22 minutes.   I, Water quality scientist, CMA, am acting as Location manager for [employer], [place].  I have reviewed the above documentation for accuracy and completeness, and I agree with the above. - [name], FNP

## 2020-11-05 NOTE — ED Notes (Signed)
Patient returns from MRI
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
## 2020-11-05 NOTE — ED Notes (Signed)
Patient given discharge instructions, all questions answered. Patient in possession of all belongings, directed to the discharge area  

## 2020-11-05 NOTE — ED Notes (Signed)
Patient transported to X-ray 

## 2020-11-06 NOTE — Telephone Encounter (Signed)
Last OV with Dawn 

## 2020-11-07 ENCOUNTER — Ambulatory Visit: Payer: Medicare HMO | Admitting: Family Medicine

## 2020-11-07 ENCOUNTER — Emergency Department
Admission: EM | Admit: 2020-11-07 | Discharge: 2020-11-07 | Disposition: A | Discharge status: Home / Self Care | Payer: Medicare HMO | Source: Home / Self Care

## 2020-11-07 ENCOUNTER — Encounter: Payer: Self-pay | Admitting: *Deleted

## 2020-11-07 ENCOUNTER — Other Ambulatory Visit: Payer: Self-pay

## 2020-11-07 DIAGNOSIS — M545 Low back pain, unspecified: Secondary | ICD-10-CM | POA: Insufficient documentation

## 2020-11-07 DIAGNOSIS — Z5321 Procedure and treatment not carried out due to patient leaving prior to being seen by health care provider: Secondary | ICD-10-CM | POA: Insufficient documentation

## 2020-11-07 DIAGNOSIS — Z9181 History of falling: Secondary | ICD-10-CM | POA: Insufficient documentation

## 2020-11-07 LAB — CBC
HCT: 39 % (ref 36.0–46.0)
Hemoglobin: 13 g/dL (ref 12.0–15.0)
MCH: 29.5 pg (ref 26.0–34.0)
MCHC: 33.3 g/dL (ref 30.0–36.0)
MCV: 88.6 fL (ref 80.0–100.0)
Platelets: 402 10*3/uL — ABNORMAL HIGH (ref 150–400)
RBC: 4.4 MIL/uL (ref 3.87–5.11)
RDW: 13.2 % (ref 11.5–15.5)
WBC: 10.5 10*3/uL (ref 4.0–10.5)
nRBC: 0 % (ref 0.0–0.2)

## 2020-11-07 LAB — URINALYSIS, COMPLETE (UACMP) WITH MICROSCOPIC
Bilirubin Urine: NEGATIVE
Glucose, UA: NEGATIVE mg/dL
Hgb urine dipstick: NEGATIVE
Ketones, ur: NEGATIVE mg/dL
Nitrite: NEGATIVE
Protein, ur: NEGATIVE mg/dL
Specific Gravity, Urine: 1.01 (ref 1.005–1.030)
pH: 6 (ref 5.0–8.0)

## 2020-11-07 LAB — BASIC METABOLIC PANEL
Anion gap: 6 (ref 5–15)
BUN: 20 mg/dL (ref 8–23)
CO2: 24 mmol/L (ref 22–32)
Calcium: 9.4 mg/dL (ref 8.9–10.3)
Chloride: 106 mmol/L (ref 98–111)
Creatinine, Ser: 0.93 mg/dL (ref 0.44–1.00)
GFR, Estimated: >60 mL/min (ref >60)
Glucose, Bld: 91 mg/dL (ref 70–99)
Potassium: 4 mmol/L (ref 3.5–5.1)
Sodium: 136 mmol/L (ref 135–145)

## 2020-11-07 NOTE — Telephone Encounter (Signed)
Please advise 

## 2020-11-07 NOTE — ED Triage Notes (Signed)
Pt to triage via wheelchair.  Pt has lower back pain.  Pt fell 1 week ago and was seen here 2 days ago.  Pt reports she is unable to ambulate.  Pt alert  speech clear.

## 2020-11-08 ENCOUNTER — Inpatient Hospital Stay
Admission: EM | Admit: 2020-11-08 | Discharge: 2020-11-10 | DRG: 552 | Disposition: A | Discharge status: Home / Self Care | Payer: Medicare HMO | Attending: Internal Medicine | Admitting: Internal Medicine

## 2020-11-08 ENCOUNTER — Other Ambulatory Visit: Payer: Self-pay

## 2020-11-08 ENCOUNTER — Encounter: Payer: Self-pay | Admitting: Internal Medicine

## 2020-11-08 DIAGNOSIS — M5116 Intervertebral disc disorders with radiculopathy, lumbar region: Secondary | ICD-10-CM | POA: Diagnosis present

## 2020-11-08 DIAGNOSIS — Z20822 Contact with and (suspected) exposure to covid-19: Secondary | ICD-10-CM | POA: Diagnosis present

## 2020-11-08 DIAGNOSIS — G834 Cauda equina syndrome: Secondary | ICD-10-CM | POA: Diagnosis present

## 2020-11-08 DIAGNOSIS — F3289 Other specified depressive episodes: Secondary | ICD-10-CM | POA: Diagnosis not present

## 2020-11-08 DIAGNOSIS — M069 Rheumatoid arthritis, unspecified: Secondary | ICD-10-CM | POA: Diagnosis present

## 2020-11-08 DIAGNOSIS — Z8249 Family history of ischemic heart disease and other diseases of the circulatory system: Secondary | ICD-10-CM

## 2020-11-08 DIAGNOSIS — M48061 Spinal stenosis, lumbar region without neurogenic claudication: Principal | ICD-10-CM | POA: Diagnosis present

## 2020-11-08 DIAGNOSIS — K219 Gastro-esophageal reflux disease without esophagitis: Secondary | ICD-10-CM | POA: Diagnosis present

## 2020-11-08 DIAGNOSIS — M4807 Spinal stenosis, lumbosacral region: Secondary | ICD-10-CM | POA: Diagnosis present

## 2020-11-08 DIAGNOSIS — M810 Age-related osteoporosis without current pathological fracture: Secondary | ICD-10-CM | POA: Diagnosis present

## 2020-11-08 DIAGNOSIS — G8929 Other chronic pain: Secondary | ICD-10-CM | POA: Diagnosis present

## 2020-11-08 DIAGNOSIS — M47816 Spondylosis without myelopathy or radiculopathy, lumbar region: Secondary | ICD-10-CM | POA: Diagnosis present

## 2020-11-08 DIAGNOSIS — Z8616 Personal history of COVID-19: Secondary | ICD-10-CM | POA: Diagnosis not present

## 2020-11-08 DIAGNOSIS — F319 Bipolar disorder, unspecified: Secondary | ICD-10-CM | POA: Diagnosis present

## 2020-11-08 DIAGNOSIS — R262 Difficulty in walking, not elsewhere classified: Secondary | ICD-10-CM

## 2020-11-08 DIAGNOSIS — M545 Low back pain, unspecified: Secondary | ICD-10-CM | POA: Diagnosis present

## 2020-11-08 DIAGNOSIS — K581 Irritable bowel syndrome with constipation: Secondary | ICD-10-CM | POA: Diagnosis present

## 2020-11-08 DIAGNOSIS — Z90710 Acquired absence of both cervix and uterus: Secondary | ICD-10-CM | POA: Diagnosis not present

## 2020-11-08 DIAGNOSIS — Z801 Family history of malignant neoplasm of trachea, bronchus and lung: Secondary | ICD-10-CM

## 2020-11-08 DIAGNOSIS — M797 Fibromyalgia: Secondary | ICD-10-CM | POA: Diagnosis present

## 2020-11-08 DIAGNOSIS — I10 Essential (primary) hypertension: Secondary | ICD-10-CM | POA: Diagnosis present

## 2020-11-08 DIAGNOSIS — M4800 Spinal stenosis, site unspecified: Secondary | ICD-10-CM

## 2020-11-08 DIAGNOSIS — F32A Depression, unspecified: Secondary | ICD-10-CM

## 2020-11-08 DIAGNOSIS — M5441 Lumbago with sciatica, right side: Secondary | ICD-10-CM

## 2020-11-08 DIAGNOSIS — Z87891 Personal history of nicotine dependence: Secondary | ICD-10-CM

## 2020-11-08 DIAGNOSIS — E785 Hyperlipidemia, unspecified: Secondary | ICD-10-CM | POA: Diagnosis present

## 2020-11-08 DIAGNOSIS — M4316 Spondylolisthesis, lumbar region: Secondary | ICD-10-CM

## 2020-11-08 LAB — RESP PANEL BY RT-PCR (FLU A&B, COVID) ARPGX2
Influenza A by PCR: NEGATIVE
Influenza B by PCR: NEGATIVE
SARS Coronavirus 2 by RT PCR: NEGATIVE

## 2020-11-08 MED ORDER — MORPHINE SULFATE (PF) 2 MG/ML IV SOLN
2.0000 mg | INTRAVENOUS | Status: DC | PRN
  Administered 2020-11-08 – 2020-11-09 (×4): 2 mg via INTRAVENOUS
  Filled 2020-11-08 (×4): qty 1

## 2020-11-08 MED ORDER — ACETAMINOPHEN 650 MG RE SUPP: 650.0000 mg | Freq: Four times a day (QID) | RECTAL | Status: DC | PRN

## 2020-11-08 MED ORDER — LIDOCAINE 5 % EX PTCH
1.0000 | MEDICATED_PATCH | CUTANEOUS | Status: DC
  Administered 2020-11-08 – 2020-11-09 (×2): 1 via TRANSDERMAL
  Filled 2020-11-08 (×3): qty 1

## 2020-11-08 MED ORDER — ZOLPIDEM TARTRATE 5 MG PO TABS
5.0000 mg | ORAL_TABLET | Freq: Every evening | ORAL | Status: DC | PRN
  Administered 2020-11-09 – 2020-11-10 (×2): 5 mg via ORAL
  Filled 2020-11-08 (×2): qty 1

## 2020-11-08 MED ORDER — ONDANSETRON HCL 4 MG PO TABS: 4.0000 mg | ORAL_TABLET | Freq: Four times a day (QID) | ORAL | Status: DC | PRN

## 2020-11-08 MED ORDER — CALCIUM CARBONATE ANTACID 500 MG PO CHEW
1.0000 | CHEWABLE_TABLET | Freq: Four times a day (QID) | ORAL | Status: DC | PRN
  Administered 2020-11-08: 200 mg via ORAL
  Filled 2020-11-08: qty 1

## 2020-11-08 MED ORDER — DIAZEPAM 2 MG PO TABS
2.0000 mg | ORAL_TABLET | Freq: Three times a day (TID) | ORAL | Status: DC | PRN
  Administered 2020-11-08 – 2020-11-09 (×4): 2 mg via ORAL
  Filled 2020-11-08 (×4): qty 1

## 2020-11-08 MED ORDER — PANTOPRAZOLE SODIUM 20 MG PO TBEC: 20.0000 mg | DELAYED_RELEASE_TABLET | Freq: Every day | ORAL | Status: DC

## 2020-11-08 MED ORDER — KETOROLAC TROMETHAMINE 30 MG/ML IJ SOLN
15.0000 mg | Freq: Four times a day (QID) | INTRAMUSCULAR | Status: DC | PRN
  Administered 2020-11-08 – 2020-11-10 (×3): 15 mg via INTRAVENOUS
  Filled 2020-11-08 (×3): qty 1

## 2020-11-08 MED ORDER — ONDANSETRON HCL 4 MG/2ML IJ SOLN: 4.0000 mg | Freq: Four times a day (QID) | INTRAMUSCULAR | Status: DC | PRN

## 2020-11-08 MED ORDER — ACETAMINOPHEN 325 MG PO TABS: 650.0000 mg | ORAL_TABLET | Freq: Four times a day (QID) | ORAL | Status: DC | PRN

## 2020-11-08 MED ORDER — PANTOPRAZOLE SODIUM 40 MG PO TBEC
40.0000 mg | DELAYED_RELEASE_TABLET | Freq: Every day | ORAL | Status: DC
  Administered 2020-11-08 – 2020-11-10 (×3): 40 mg via ORAL
  Filled 2020-11-08 (×3): qty 1

## 2020-11-08 MED ORDER — DEXAMETHASONE SODIUM PHOSPHATE 4 MG/ML IJ SOLN
4.0000 mg | Freq: Three times a day (TID) | INTRAMUSCULAR | Status: DC
  Administered 2020-11-08 – 2020-11-10 (×7): 4 mg via INTRAVENOUS
  Filled 2020-11-08 (×8): qty 1

## 2020-11-08 MED ORDER — FENTANYL CITRATE PF 50 MCG/ML IJ SOSY
50.0000 ug | PREFILLED_SYRINGE | Freq: Once | INTRAMUSCULAR | Status: AC
  Administered 2020-11-08: 50 ug via INTRAVENOUS
  Filled 2020-11-08: qty 1

## 2020-11-08 MED ORDER — DIAZEPAM 2 MG PO TABS
2.0000 mg | ORAL_TABLET | Freq: Once | ORAL | Status: AC
  Administered 2020-11-08: 2 mg via ORAL
  Filled 2020-11-08: qty 1

## 2020-11-08 MED ORDER — DEXAMETHASONE SODIUM PHOSPHATE 10 MG/ML IJ SOLN
10.0000 mg | Freq: Once | INTRAMUSCULAR | Status: AC
  Administered 2020-11-08: 10 mg via INTRAVENOUS
  Filled 2020-11-08: qty 1
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
## 2020-11-08 NOTE — ED Triage Notes (Signed)
Pt in with co lower back pain that started Sunday, states since then pain has worsened and has been more difficult to walk.

## 2020-11-08 NOTE — Evaluation (Signed)
Physical Therapy Evaluation Patient Details Name: [name] MRN: [id number] DOB: 03-09-1950 Today's Date: 11/08/2020  History of Present Illness  Pt is a 70 y/o F admitted on 01/08/21 with c/c of low back pain, difficulty walking, and RLE weakness. This is pt's 2nd time presenting to the ED in the past 5 days; the first time pt was seen for c/o worsening lower back pain & difficulty ambulating, which led to a fall. MRI revealed no acute fx or dislocation but did show worsening of chronic spinal stenosis & facet arthropathy compared to MRI in 2017. She was d/c on oral steroids & pain meds but returns 2/2 inability to ambulate due to pain & weakness. Neurology was consulted & there are no plans for acute neurosurgical intervention at this time. PMH: depression, chronic back pain 2/2 lumbar radiculopathy, ADD, anxiety, bipolar 1 disorder, L eye cataract, fibromyalgia, Hepatitis B, HLD, IBS, OA  Clinical Impression  Pt seen for PT evaluation with pt reporting she was independent, working, and driving without use of an AD prior to admission but does endorse multiple falls in the past 6 months 2/2 tripping over things with LLE. On this date, pt is able to complete bed mobility with mod I & transfers with supervision. Pt ambulates 100 ft with rollator & CGA fade to close supervision. Pt is limited by significant fatigue after gait with PT encouraging pt to ambulate with nursing assistance. Pt would benefit from OPPT f/u upon d/c.       Recommendations for follow up therapy are one component of a multi-disciplinary discharge planning process, led by the attending physician.  Recommendations may be updated based on patient status, additional functional criteria and insurance authorization.  Follow Up Recommendations Outpatient PT    Assistance Recommended at Discharge None  Functional Status Assessment Patient has had a recent decline in their functional status and demonstrates the ability to make  significant improvements in function in a reasonable and predictable amount of time.  Equipment Recommendations  None recommended by PT (pt already has a rollator)    Recommendations for Other Services       Precautions / Restrictions Precautions Precautions: None Restrictions Weight Bearing Restrictions: No      Mobility  Bed Mobility Overal bed mobility: Modified Independent             General bed mobility comments: supine<>sit mod I with HOB slightly elevated    Transfers Overall transfer level: Needs assistance Equipment used: Rollator (4 wheels) Transfers: Sit to/from Stand Sit to Stand: Supervision           General transfer comment: cuing for proper hand placement    Ambulation/Gait Ambulation/Gait assistance: Min guard;Supervision Gait Distance (Feet): 100 Feet (CGA progressing to supervision) Assistive device: Rollator (4 wheels) Gait Pattern/deviations: Decreased stride length Gait velocity: slightly decreased      Stairs            Wheelchair Mobility    Modified Rankin (Stroke Patients Only)       Balance Overall balance assessment: Needs assistance Sitting-balance support: Feet unsupported;Bilateral upper extremity supported Sitting balance-Leahy Scale: Good       Standing balance-Leahy Scale: Fair                               Pertinent Vitals/Pain Pain Assessment: 0-10 Pain Score: 5  Pain Location: low back, radiating to BLE Pain Descriptors / Indicators: Burning;Discomfort Pain Intervention(s): Monitored during session;Premedicated  before session    [place] expects to be discharged to:: Private residence Living Arrangements: Alone Available Help at Discharge: Neighbor;Friend(s);Available PRN/intermittently Type of Home: Apartment Home Access: Level entry Entrance Stairs-Rails: None Entrance Stairs-Number of Steps: curb step when she parks on side of road   Home Layout: One  level Home Equipment: Rollator (4 wheels)      Prior Function Prior Level of Function : Independent/Modified Independent             Mobility Comments: Pt reports independence without AD until a couple of days ago when her friend purchased a rollator for her. Pt does endorse at least 4 falls in the past 6 months but attributes this due to LLE (since her L TKA) & states she trips over stuff vs falling. 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       Hand Dominance        Extremity/Trunk Assessment   Upper Extremity Assessment Upper Extremity Assessment: Overall WFL for tasks assessed    Lower Extremity Assessment Lower Extremity Assessment: Generalized weakness (LLE ankle dorsiflexion 2+/5)    Cervical / Trunk Assessment Cervical / Trunk Assessment: Normal  Communication   Communication: No difficulties  Cognition Arousal/Alertness: Awake/alert Behavior During Therapy: WFL for tasks assessed/performed Overall Cognitive Status: Within Functional Limits for tasks assessed                                          General Comments      Exercises     Assessment/Plan    PT Assessment Patient needs continued PT services  PT Problem List Decreased strength;Pain;Decreased activity tolerance;Decreased balance;Decreased mobility;Decreased knowledge of precautions       PT Treatment Interventions Therapeutic exercise;DME instruction;Gait training;Balance training;Stair training;Neuromuscular re-education;Modalities;Functional mobility training;Patient/family education;Therapeutic activities    PT Goals (Current goals can be found in the Care Plan section)  Acute Rehab PT Goals Patient Stated Goal: decreased pain, return to PLOF PT Goal Formulation: With patient Time For Goal Achievement: 11/22/20 Potential to Achieve Goals: Good    Frequency Min 2X/week   Barriers to discharge Decreased caregiver support lives alone     Co-evaluation               AM-PAC PT "6 Clicks" Mobility  Outcome Measure Help needed turning from your back to your side while in a flat bed without using bedrails?: None Help needed moving from lying on your back to sitting on the side of a flat bed without using bedrails?: None Help needed moving to and from a bed to a chair (including a wheelchair)?: A Little Help needed standing up from a chair using your arms (e.g., wheelchair or bedside chair)?: A Little Help needed to walk in hospital room?: A Little Help needed climbing 3-5 steps with a railing? : A Little 6 Click Score: 20    End of Session Equipment Utilized During Treatment: Gait belt Activity Tolerance: Patient tolerated treatment well;Patient limited by fatigue Patient left: in bed (in hallway room - no call bell available)   PT Visit Diagnosis: Muscle weakness (generalized) (M62.81);Unsteadiness on feet (R26.81);Difficulty in walking, not elsewhere classified (R26.2);Pain Pain - Right/Left:  (BLE & back)    Time: 4742-5956 PT Time Calculation (min) (ACUTE ONLY): 17 min   Charges:   PT Evaluation $PT Eval Low Complexity: 1 Low  Lavone Nian, PT, DPT 11/08/20, 3:35 PM   Waunita Schooner 11/08/2020, 3:34 PM
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
## 2020-11-08 NOTE — ED Notes (Signed)
Patient ambulated to the bathroom roughly 30 feet with Rolator walker. Upon finishing voiding she was unable to stand and get her pants on without assistance. She did well with walking but then showed me how she walks without walker and ambulation is antalgic with short gait and unable to walk unassisted.   She reports constipation with taking pain medication despite taking Miralax. Last BM was Monday (4 days ago) but denies nausea, vomiting, abdominal pain and is passing flatus.

## 2020-11-08 NOTE — Progress Notes (Signed)
Patient ID: [name], female   DOB: [birth year], 70 y.o.   MRN: [id number] This is a no charge note as patient was admitted this AM.  Patient seen and examined H&P reviewed.  Patient currently on stretcher in the hallway.  Complaining of back pain.  [name] is a 70 y.o. female with medical history significant for , depression, chronic back pain secondary to lumbar radiculopathy, who follows at the pain clinic for steroid injections, who presents to the ED for the second time in 5 days with progressively worsening low back pain.  She was initially seen on 10/2520 for acutely worsening low back pain with difficulty ambulating leading to a fall.  She had an MRI that showed no acute fracture or dislocation but did show worsening of chronic spinal stenosis and facet arthropathy compared to MRI in 2017.  She was discharged on oral steroids and pain meds however she returns because of inability to ambulate due to pain and weakness of the left lower extremity.  She denies bowel or bladder incontinence or retention.  Denies numbness or tingling.  Denies fever or chills.  Pain is of severe intensity and felt in the low back and is nonradiating.  Cta no w/r Regular s1/ s2  No edema   A/P Neurosurgery consulted and input appreciated Patient started on IV Decadron Will add PPI for GI prophylaxis Pain management, add lidocaine patch Physical therapy -No plan for acute neurosurgical intervention at this time.  We will follow-up with the patient outpatient.  Our office will arrange a clinic appointment for her.

## 2020-11-08 NOTE — ED Notes (Signed)
Informed RN bed assigned 
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
## 2020-11-09 DIAGNOSIS — F3289 Other specified depressive episodes: Secondary | ICD-10-CM | POA: Diagnosis not present

## 2020-11-09 DIAGNOSIS — M5441 Lumbago with sciatica, right side: Secondary | ICD-10-CM | POA: Diagnosis not present

## 2020-11-09 DIAGNOSIS — I10 Essential (primary) hypertension: Secondary | ICD-10-CM

## 2020-11-09 DIAGNOSIS — R262 Difficulty in walking, not elsewhere classified: Secondary | ICD-10-CM | POA: Diagnosis not present

## 2020-11-09 MED ORDER — PREGABALIN 50 MG PO CAPS
100.0000 mg | ORAL_CAPSULE | Freq: Two times a day (BID) | ORAL | Status: DC
  Administered 2020-11-09 – 2020-11-10 (×3): 100 mg via ORAL
  Filled 2020-11-09 (×3): qty 2

## 2020-11-09 MED ORDER — CALCIUM CARBONATE ANTACID 500 MG PO CHEW: 2.0000 | CHEWABLE_TABLET | Freq: Four times a day (QID) | ORAL | Status: DC | PRN

## 2020-11-09 MED ORDER — ARIPIPRAZOLE 2 MG PO TABS
2.0000 mg | ORAL_TABLET | Freq: Every day | ORAL | Status: DC
  Administered 2020-11-09 – 2020-11-10 (×2): 2 mg via ORAL
  Filled 2020-11-09 (×2): qty 1

## 2020-11-09 MED ORDER — ENOXAPARIN SODIUM 40 MG/0.4ML IJ SOSY
40.0000 mg | PREFILLED_SYRINGE | INTRAMUSCULAR | Status: DC
  Administered 2020-11-09: 40 mg via SUBCUTANEOUS
  Filled 2020-11-09: qty 0.4

## 2020-11-09 MED ORDER — ESCITALOPRAM OXALATE 20 MG PO TABS
20.0000 mg | ORAL_TABLET | Freq: Every day | ORAL | Status: DC
  Administered 2020-11-09 – 2020-11-10 (×2): 20 mg via ORAL
  Filled 2020-11-09 (×2): qty 1

## 2020-11-09 NOTE — Progress Notes (Signed)
PROGRESS NOTE    [name]  PYP:[id number] DOB: 21-Aug-1950 DOA: 11/08/2020 PCP: [name], DO    Brief Narrative:   [name] is a 70 y.o. female with medical history significant for , depression, chronic back pain secondary to lumbar radiculopathy, who follows at the pain clinic for steroid injections, who presents to the ED for the second time in 5 days with progressively worsening low back pain.  She was initially seen on 10/2520 for acutely worsening low back pain with difficulty ambulating leading to a fall.  She had an MRI that showed no acute fracture or dislocation but did show worsening of chronic spinal stenosis and facet arthropathy compared to MRI in 2017.  She was discharged on oral steroids and pain meds however she returns because of inability to ambulate due to pain and weakness of the left lower extremity.  She denies bowel or bladder incontinence or retention.  Denies numbness or tingling.  Denies fever or chills.  Pain is of severe intensity and felt in the low back and is nonradiating.   ED course: On arrival vitals within normal limits Blood work unremarkable.  Urinalysis with large leukocyte esterase   Imaging: MRI from 10/25, nonacute but with moderate to severe spinal canal stenosis and advanced facet arthropathy.  Please see below under imaging for details     Consultants:  Neurosurgery  Procedures:   Antimicrobials:      Subjective: Reports less pain today and feels that the Decadron has definitely helped her.  She feels wobbly still walking but not as much pain with ambulation  Objective: Vitals:   11/08/20 1838 11/08/20 2026 11/09/20 0548 11/09/20 0804  BP: 136/76 122/67 122/62 131/64  Pulse: 91 96 65 60  Resp: 17 18 18 20   Temp: 98.4 F (36.9 C) 98.6 F (37 C) 98.3 F (36.8 C) 98.5 F (36.9 C)  TempSrc:  Oral    SpO2: 100% 96% 96% 98%  Weight:      Height:       No intake or output data in the 24 hours ending  11/09/20 1131 Filed Weights   11/08/20 0054  Weight: 68 kg    Examination: General exam: Appears calm and comfortable  Respiratory system: Clear to auscultation. Respiratory effort normal. Cardiovascular system: S1 & S2 heard, RRR. No JVD, murmurs, rubs, gallops or clicks.  Gastrointestinal system: Abdomen is nondistended, soft and nontender. Normal bowel sounds heard. Central nervous system: Alert and oriented.grossly intact.  Extremities: no edema Psychiatry: Judgement and insight appear normal. Mood & affect appropriate.     Data Reviewed: I have personally reviewed following labs and imaging studies  CBC: Recent Labs  Lab 11/07/20 1958  WBC 10.5  HGB 13.0  HCT 39.0  MCV 88.6  PLT 245*   Basic Metabolic Panel: Recent Labs  Lab 11/07/20 1958  NA 136  K 4.0  CL 106  CO2 24  GLUCOSE 91  BUN 20  CREATININE 0.93  CALCIUM 9.4   GFR: Estimated Creatinine Clearance: 42.3 mL/min (by C-G formula based on SCr of 0.93 mg/dL). Liver Function Tests: No results for input(s): AST, ALT, ALKPHOS, BILITOT, PROT, ALBUMIN in the last 168 hours. No results for input(s): LIPASE, AMYLASE in the last 168 hours. No results for input(s): AMMONIA in the last 168 hours. Coagulation Profile: No results for input(s): INR, PROTIME in the last 168 hours. Cardiac Enzymes: No results for input(s): CKTOTAL, CKMB, CKMBINDEX, TROPONINI in the last 168 hours. BNP (last  3 results) No results for input(s): PROBNP in the last 8760 hours. HbA1C: No results for input(s): HGBA1C in the last 72 hours. CBG: No results for input(s): GLUCAP in the last 168 hours. Lipid Profile: No results for input(s): CHOL, HDL, LDLCALC, TRIG, CHOLHDL, LDLDIRECT in the last 72 hours. Thyroid Function Tests: No results for input(s): TSH, T4TOTAL, FREET4, T3FREE, THYROIDAB in the last 72 hours. Anemia Panel: No results for input(s): VITAMINB12, FOLATE, FERRITIN, TIBC, IRON, RETICCTPCT in the last 72 hours. Sepsis  Labs: No results for input(s): PROCALCITON, LATICACIDVEN in the last 168 hours.  Recent Results (from the past 240 hour(s))  Resp Panel by RT-PCR (Flu A&B, Covid) Nasopharyngeal Swab     Status: None   Collection Time: 11/08/20  2:02 AM   Specimen: Nasopharyngeal Swab; Nasopharyngeal(NP) swabs in vial transport medium  Result Value Ref Range Status   SARS Coronavirus 2 by RT PCR NEGATIVE NEGATIVE Final    Comment: (NOTE) SARS-CoV-2 target nucleic acids are NOT DETECTED.  The SARS-CoV-2 RNA is generally detectable in upper respiratory specimens during the acute phase of infection. The lowest concentration of SARS-CoV-2 viral copies this assay can detect is 138 copies/mL. A negative result does not preclude SARS-Cov-2 infection and should not be used as the sole basis for treatment or other patient management decisions. A negative result may occur with  improper specimen collection/handling, submission of specimen other than nasopharyngeal swab, presence of viral mutation(s) within the areas targeted by this assay, and inadequate number of viral copies(<138 copies/mL). A negative result must be combined with clinical observations, patient history, and epidemiological information. The expected result is Negative.  Fact Sheet for Patients:  EntrepreneurPulse.com.au  Fact Sheet for Healthcare Providers:  IncredibleEmployment.be  This test is no t yet approved or cleared by the [place] FDA and  has been authorized for detection and/or diagnosis of SARS-CoV-2 by FDA under an Emergency Use Authorization (EUA). This EUA will remain  in effect (meaning this test can be used) for the duration of the COVID-19 declaration under Section 564(b)(1) of the Act, 21 U.S.C.section 360bbb-3(b)(1), unless the authorization is terminated  or revoked sooner.       Influenza A by PCR NEGATIVE NEGATIVE Final   Influenza B by PCR NEGATIVE NEGATIVE Final     Comment: (NOTE) The Xpert Xpress SARS-CoV-2/FLU/RSV plus assay is intended as an aid in the diagnosis of influenza from Nasopharyngeal swab specimens and should not be used as a sole basis for treatment. Nasal washings and aspirates are unacceptable for Xpert Xpress SARS-CoV-2/FLU/RSV testing.  Fact Sheet for Patients: EntrepreneurPulse.com.au  Fact Sheet for Healthcare Providers: IncredibleEmployment.be  This test is not yet approved or cleared by the [place] FDA and has been authorized for detection and/or diagnosis of SARS-CoV-2 by FDA under an Emergency Use Authorization (EUA). This EUA will remain in effect (meaning this test can be used) for the duration of the COVID-19 declaration under Section 564(b)(1) of the Act, 21 U.S.C. section 360bbb-3(b)(1), unless the authorization is terminated or revoked.  Performed at [facility], [address] [place],  [address]          Radiology Studies: No results found.      Scheduled Meds:  ARIPiprazole  2 mg Oral Daily   dexamethasone (DECADRON) injection  4 mg Intravenous Q8H   escitalopram  20 mg Oral Daily   lidocaine  1 patch Transdermal Q24H   pantoprazole  40 mg Oral Daily   pregabalin  100 mg Oral  BID   Continuous Infusions:  Assessment & Plan:   Principal Problem:   Acute low back pain Active Problems:   Rheumatoid arthritis (HCC)   Essential hypertension   Depression   Chronic right-sided low back pain with right-sided sciatica   Ambulatory dysfunction   Acute low back pain   Ambulatory dysfunction   Chronic right-sided low back pain with right-sided sciatica - MRI from 10/25 showing severe spinal stenosis and facet arthropathy - Neurosurgery's input was appreciated.  Patient on IV Decadron, PT, no plans for acute neurosurgical intervention at this time and follow-up as outpatient.  Clinically she is improving but still has problems with  ambulation Per patient and pain.  Will continue IV Decadron, add Lyrica home dose Pain control PT Pain past BP stable     Essential hypertension - Continue hydrochlorothiazide     Depression/mood disorder/ADD Continue Abilify, Lexapro   DVT prophylaxis: Lovenox Code Status: Full Family Communication: None at bedside Disposition Plan: Home Status is: Inpatient  Remains inpatient appropriate because: Needs IV treatment and pain control            LOS: 1 day   Time spent: 35 minutes with more than 50% on COC    Nolberto Hanlon, MD [facility] Pager [phone]  If 7PM-7AM, please contact night-coverage 11/09/2020, 11:31 AM

## 2020-11-09 NOTE — Progress Notes (Signed)
PHARMACIST - PHYSICIAN COMMUNICATION  CONCERNING:  Enoxaparin (Lovenox) for DVT Prophylaxis    RECOMMENDATION: Patient was prescribed enoxaprin 40mg  q24 hours for VTE prophylaxis.   Filed Weights   11/08/20 0054  Weight: 68 kg (150 lb)    Body mass index is 34.86 kg/m.  Estimated Creatinine Clearance: 42.3 mL/min (by C-G formula based on SCr of 0.93 mg/dL).   Based on [place] patient is candidate for enoxaparin 0.5mg /kg TBW SQ every 24 hours based on BMI being >30.   DESCRIPTION: Pharmacy has adjusted enoxaparin dose per [facility] policy.  Patient is now receiving enoxaparin 40 mg every 24 hours    Wynelle Cleveland, PharmD Pharmacy Resident  11/09/2020 2:34 PM

## 2020-11-10 DIAGNOSIS — M5441 Lumbago with sciatica, right side: Secondary | ICD-10-CM | POA: Diagnosis not present

## 2020-11-10 DIAGNOSIS — F32A Depression, unspecified: Secondary | ICD-10-CM

## 2020-11-10 DIAGNOSIS — R262 Difficulty in walking, not elsewhere classified: Secondary | ICD-10-CM | POA: Diagnosis not present

## 2020-11-10 DIAGNOSIS — I10 Essential (primary) hypertension: Secondary | ICD-10-CM | POA: Diagnosis not present

## 2020-11-10 MED ORDER — LIDOCAINE 5 % EX PTCH: 1.0000 | MEDICATED_PATCH | CUTANEOUS | 0 refills | Status: AC

## 2020-11-10 NOTE — Discharge Summary (Signed)
[name] GMW:[id number] DOB: 06-08-1950 DOA: 11/08/2020  PCP: [name], DO  Admit date: 11/08/2020 Discharge date: 11/10/2020  Admitted From: home Disposition:  home  Recommendations for Outpatient Follow-up:  Follow up with PCP in 1 week Please obtain BMP/CBC in one week Please follow up with neurosurgery in one week     Discharge Condition:Stable CODE STATUS:full  Diet recommendation: Heart Healthy  Brief/Interim Summary: Per HPI:  [name] is a 70 y.o. female with medical history significant for , depression, chronic back pain secondary to lumbar radiculopathy, who follows at the pain clinic for steroid injections, who presents to the ED for the second time in 5 days with progressively worsening low back pain.  She was initially seen on 10/2520 for acutely worsening low back pain with difficulty ambulating leading to a fall.  She had an MRI that showed no acute fracture or dislocation but did show worsening of chronic spinal stenosis and facet arthropathy compared to MRI in 2017.  She was discharged on oral steroids and pain meds however she returns because of inability to ambulate due to pain and weakness of the left lower extremity.  She denies bowel or bladder incontinence or retention.  Denies numbness or tingling.  Denies fever or chills.  Pain is of severe intensity and felt in the low back and is nonradiating. Neurosurgery was consulted.   Acute low back pain   Ambulatory dysfunction   Chronic right-sided low back pain with right-sided sciatica - MRI from 10/25 showing severe spinal stenosis and facet arthropathy - Neurosurgery was consulted, started patient on IV Decadron.  No plans for acute neurosurgical intervention at this time and follow-up as outpatient.  Physical therapy was consulted recommended outpatient therapy Doing better today symptoms have improved. She was given steroid taper pack and pain meds however she did not pick it up from  her pharmacy since she ended up coming to the hospital due to severe pain.  Patient was instructed to continue finishing the steroid pack she was given starting tomorrow and the pain meds she was already prescribed.       Essential hypertension - Continue hydrochlorothiazide     Depression/mood disorder/ADD Continue home meds  Discharge Diagnoses:  Principal Problem:   Acute low back pain Active Problems:   Essential hypertension   Depression   Chronic right-sided low back pain with right-sided sciatica   Ambulatory dysfunction    Discharge Instructions  Discharge Instructions     Diet - low sodium heart healthy   Complete by: As directed    Discharge instructions   Complete by: As directed    Start your prednisone pack from tomorrow.   Increase activity slowly   Complete by: As directed       Allergies as of 11/10/2020       Reactions   Cefuroxime Axetil Anaphylaxis   Oxycodone Itching       Simvastatin    Severe pain everywhere, reaction to all statins   Statins    Sulfa Antibiotics Nausea Only   Ciprofloxacin Palpitations   Erythromycin Nausea And Vomiting   Metaxalone Rash       Sulfonamide Derivatives Hives            Medication List     STOP taking these medications    meloxicam 15 MG tablet Commonly known as: MOBIC   methocarbamol 500 MG tablet Commonly known as: Robaxin       TAKE these medications  alendronate 70 MG tablet Commonly known as: FOSAMAX every 7 (seven) days What changed:  how much to take how to take this when to take this additional instructions   ARIPiprazole 2 MG tablet Commonly known as: ABILIFY TAKE 1 TABLET EVERY DAY   celecoxib 200 MG capsule Commonly known as: CELEBREX Take 200 mg by mouth daily as needed.   cholecalciferol 25 MCG (1000 UNIT) tablet Commonly known as: VITAMIN D3 Take 2,000 Units by mouth daily.   cyclobenzaprine 10 MG tablet Commonly known as: FLEXERIL TAKE 1 TABLET 2 TIMES  DAILY AS NEEDED FOR MUSCLE SPASMS.   ergocalciferol 1.25 MG (50000 UT) capsule Commonly known as: VITAMIN D2 Take 1 capsule by mouth once a week.   escitalopram 20 MG tablet Commonly known as: LEXAPRO TAKE 1 TABLET EVERY DAY   fenofibrate 160 MG tablet Take 1 tablet (160 mg total) by mouth daily.   fluticasone 50 MCG/ACT nasal spray Commonly known as: FLONASE Place 2 sprays into both nostrils daily. What changed:  when to take this reasons to take this   hydrochlorothiazide 25 MG tablet Commonly known as: HYDRODIURIL TAKE 1 TABLET EVERY DAY   hyoscyamine 0.125 MG SL tablet Commonly known as: LEVSIN SL Place 1 tablet (0.125 mg total) under the tongue every 4 (four) hours as needed.   lidocaine 5 % Commonly known as: LIDODERM Place 1 patch onto the skin daily for 15 doses. Remove & Discard patch within 12 hours or as directed by MD   Linzess 145 MCG Caps capsule Generic drug: linaclotide Take 145 mcg by mouth daily.   Lyrica 50 MG capsule Generic drug: pregabalin Take 100 mg by mouth 2 (two) times daily. Prescribed by pain clinic and pt usually takes 2 to 3 daily if needed   methylphenidate 10 MG tablet Commonly known as: Ritalin Take 1 tablet (10 mg total) by mouth 2 (two) times daily.   multivitamin with minerals Tabs tablet Take 1 tablet by mouth daily.   omeprazole 40 MG capsule Commonly known as: PRILOSEC TAKE 1 CAPSULE TWICE DAILY   Ozempic (0.25 or 0.5 MG/DOSE) 2 MG/1.5ML Sopn Generic drug: Semaglutide(0.25 or 0.5MG /DOS) Inject 0.5 mg into the skin once a week.   predniSONE 10 MG tablet Commonly known as: DELTASONE Take 1 tablet (10 mg total) by mouth as directed.   rOPINIRole 0.5 MG tablet Commonly known as: REQUIP TAKE 1 TABLET TWICE DAILY What changed:  how much to take when to take this   traMADol 50 MG tablet Commonly known as: ULTRAM Take 100 mg by mouth 2 (two) times daily as needed.   valACYclovir 1000 MG tablet Commonly known as:  VALTREX Take 1 tablet (1,000 mg total) by mouth 2 (two) times daily.   Ventolin HFA 108 (90 Base) MCG/ACT inhaler Generic drug: albuterol INHALE 2 PUFFS INTO LUNGS EVERY 6 HOURS AS NEEDED FOR WHEEZING OR SHORTNESS OF BREATH   zolpidem 10 MG tablet Commonly known as: AMBIEN Take 5-10 mg by mouth at bedtime as needed.        Follow-up Information     Roma Schanz R, DO Follow up in 1 week(s).   Specialty: Family Medicine Contact information: [address] [place] [address] [phone]         Meade Maw, MD Follow up in 1 week(s).   Specialty: Neurosurgery Contact information: [place] [address] [phone]  Allergies  Allergen Reactions   Cefuroxime Axetil Anaphylaxis   Oxycodone Itching        Simvastatin     Severe pain everywhere, reaction to all statins   Statins    Sulfa Antibiotics Nausea Only   Ciprofloxacin Palpitations   Erythromycin Nausea And Vomiting   Metaxalone Rash        Sulfonamide Derivatives Hives         Consultations: neurosurgery   Procedures/Studies: DG Lumbar Spine 2-3 Views  Result Date: 11/05/2020 CLINICAL DATA:  Worsening back and hip pain, trip and fall last week EXAM: LUMBAR SPINE - 2-3 VIEW; DG HIP (WITH OR WITHOUT PELVIS) 2-3V RIGHT; DG HIP (WITH OR WITHOUT PELVIS) 2-3V LEFT COMPARISON:  None. FINDINGS: No fracture or dislocation of the lumbar spine. Mild to moderate multilevel disc space height loss and osteophytosis, worst at the lower lumbar levels, with moderate facet degenerative change at the lower lumbar levels. Minimal degenerative anterolisthesis of L4 on L5 and L5 on S1. Nonobstructive pattern of overlying bowel gas. No fracture or dislocation of the bilateral hips. Minimal acetabular osteophytosis with otherwise preserved joint spaces. IMPRESSION: 1. No fracture or dislocation of the lumbar spine. 2. Mild to moderate multilevel disc space  height loss and osteophytosis, worst at the lower lumbar levels, with moderate facet degenerative change at the lower lumbar levels. 3. Minimal degenerative anterolisthesis of L4 on L5 and L5 on S1. 4. Lumbar disc and neural foraminal pathology may be further evaluated by MRI if indicated by neurologically localizing signs and symptoms. 5. No fracture or dislocation of the bilateral hips. Minimal acetabular osteophytosis. Electronically Signed   By: Delanna Ahmadi M.D.   On: 11/05/2020 14:33   MR LUMBAR SPINE WO CONTRAST  Result Date: 11/05/2020 CLINICAL DATA:  Low back pain, history of herniated disc, recent fall EXAM: MRI LUMBAR SPINE WITHOUT CONTRAST TECHNIQUE: Multiplanar, multisequence MR imaging of the lumbar spine was performed. No intravenous contrast was administered. COMPARISON:  Lumbar spine MRI 12/31/2015, same-day lumbar spine radiographs FINDINGS: Segmentation: There is a rudimentary disc space at S1-S2. For the purposes of this report, the lowest fully formed disc space is designated L5-S1. This is in keeping with the numbering scheme on the prior lumbar spine MRI. Alignment: There is 5 mm anterolisthesis of L4 on L5 and 4 mm anterolisthesis of L5 on S1, not significantly changed since 2017. Alignment at the other levels is normal. Vertebrae: Vertebral body heights are preserved. There is no suspicious marrow signal abnormality. Conus medullaris and cauda equina: Conus extends to the L1-L2 level. Conus and cauda equina appear normal. Paraspinal and other soft tissues: Negative. Disc levels: There is disc desiccation and narrowing at L4-L5 and L5-S1, slightly progressed since 2017. There is multilevel facet arthropathy, most advanced at L4-L5. There are associated bilateral facet joint effusions and trace perifacetal soft tissue edema on the right. T12-L1: There is a mild disc protrusion without significant spinal canal or neural foraminal stenosis. L1-L2: There is a mild disc protrusion without  significant spinal canal or neural foraminal stenosis. L2-L3: No significant spinal canal or neural foraminal stenosis. L3-L4: There is a mild disc bulge and mild bilateral facet arthropathy without significant spinal canal or neural foraminal stenosis. L4-L5: There is uncovering of the disc posteriorly due to the grade 1 anterolisthesis with slight superimposed superior migration of disc material, degenerative endplate change, ligamentum flavum thickening, and bulky bilateral facet arthropathy resulting in severe spinal canal stenosis with compression of the cauda equina nerve roots and  mild bilateral neural foraminal stenosis. The spinal canal stenosis has progressed since 2017. L5-S1: There is uncovering of the disc posteriorly due to the grade 1 anterolisthesis with a mild superimposed bulge, degenerative endplate change, and bilateral facet arthropathy resulting in moderate spinal canal stenosis with effacement of the subarticular zones and possible impingement of the bilateral traversing S1 nerve roots and mild-to-moderate bilateral neural foraminal stenosis. The spinal canal stenosis has progressed since 2017. IMPRESSION: 1. Grade 1 anterolisthesis of L4-L5 and L5 on S1, not significantly changed since 2017. 2. Superimposed degenerative changes at L4-L5 now result in severe spinal canal stenosis with compression of the cauda equina nerve roots since 2017. 3. Moderate spinal canal stenosis at L5-S1 with effacement of the subarticular zones and possible impingement of the traversing S1 nerve roots has also worsened since 2017. 4. Facet arthropathy is most advanced at L4-L5 with associated bilateral effusions and trace perifacetal soft tissue edema on the right. Electronically Signed   By: Valetta Mole M.D.   On: 11/05/2020 15:08   DG Hip Unilat W or Wo Pelvis 2-3 Views Left  Result Date: 11/05/2020 CLINICAL DATA:  Worsening back and hip pain, trip and fall last week EXAM: LUMBAR SPINE - 2-3 VIEW; DG HIP  (WITH OR WITHOUT PELVIS) 2-3V RIGHT; DG HIP (WITH OR WITHOUT PELVIS) 2-3V LEFT COMPARISON:  None. FINDINGS: No fracture or dislocation of the lumbar spine. Mild to moderate multilevel disc space height loss and osteophytosis, worst at the lower lumbar levels, with moderate facet degenerative change at the lower lumbar levels. Minimal degenerative anterolisthesis of L4 on L5 and L5 on S1. Nonobstructive pattern of overlying bowel gas. No fracture or dislocation of the bilateral hips. Minimal acetabular osteophytosis with otherwise preserved joint spaces. IMPRESSION: 1. No fracture or dislocation of the lumbar spine. 2. Mild to moderate multilevel disc space height loss and osteophytosis, worst at the lower lumbar levels, with moderate facet degenerative change at the lower lumbar levels. 3. Minimal degenerative anterolisthesis of L4 on L5 and L5 on S1. 4. Lumbar disc and neural foraminal pathology may be further evaluated by MRI if indicated by neurologically localizing signs and symptoms. 5. No fracture or dislocation of the bilateral hips. Minimal acetabular osteophytosis. Electronically Signed   By: Delanna Ahmadi M.D.   On: 11/05/2020 14:33   DG Hip Unilat W or Wo Pelvis 2-3 Views Right  Result Date: 11/05/2020 CLINICAL DATA:  Worsening back and hip pain, trip and fall last week EXAM: LUMBAR SPINE - 2-3 VIEW; DG HIP (WITH OR WITHOUT PELVIS) 2-3V RIGHT; DG HIP (WITH OR WITHOUT PELVIS) 2-3V LEFT COMPARISON:  None. FINDINGS: No fracture or dislocation of the lumbar spine. Mild to moderate multilevel disc space height loss and osteophytosis, worst at the lower lumbar levels, with moderate facet degenerative change at the lower lumbar levels. Minimal degenerative anterolisthesis of L4 on L5 and L5 on S1. Nonobstructive pattern of overlying bowel gas. No fracture or dislocation of the bilateral hips. Minimal acetabular osteophytosis with otherwise preserved joint spaces. IMPRESSION: 1. No fracture or dislocation of  the lumbar spine. 2. Mild to moderate multilevel disc space height loss and osteophytosis, worst at the lower lumbar levels, with moderate facet degenerative change at the lower lumbar levels. 3. Minimal degenerative anterolisthesis of L4 on L5 and L5 on S1. 4. Lumbar disc and neural foraminal pathology may be further evaluated by MRI if indicated by neurologically localizing signs and symptoms. 5. No fracture or dislocation of the bilateral hips. Minimal acetabular osteophytosis.  Electronically Signed   By: Delanna Ahmadi M.D.   On: 11/05/2020 14:33      Subjective:   Discharge Exam: Vitals:   11/10/20 0326 11/10/20 0806  BP: (!) 101/56 104/66  Pulse: 62 69  Resp: 20 12  Temp: (!) 97.3 F (36.3 C) 97.9 F (36.6 C)  SpO2: 99% 100%   Vitals:   11/09/20 1728 11/09/20 2104 11/10/20 0326 11/10/20 0806  BP: (!) 143/87 127/62 (!) 101/56 104/66  Pulse: 79 75 62 69  Resp: 20 17 20 12   Temp: 98.2 F (36.8 C) 98.3 F (36.8 C) (!) 97.3 F (36.3 C) 97.9 F (36.6 C)  TempSrc: Oral     SpO2: 97% 96% 99% 100%  Weight:      Height:        General: Pt is alert, awake, not in acute distress Cardiovascular: RRR, S1/S2 +, no rubs, no gallops Respiratory: CTA bilaterally, no wheezing, no rhonchi Abdominal: Soft, NT, ND, bowel sounds + Extremities: no edema    The results of significant diagnostics from this hospitalization (including imaging, microbiology, ancillary and laboratory) are listed below for reference.     Microbiology: Recent Results (from the past 240 hour(s))  Resp Panel by RT-PCR (Flu A&B, Covid) Nasopharyngeal Swab     Status: None   Collection Time: 11/08/20  2:02 AM   Specimen: Nasopharyngeal Swab; Nasopharyngeal(NP) swabs in vial transport medium  Result Value Ref Range Status   SARS Coronavirus 2 by RT PCR NEGATIVE NEGATIVE Final    Comment: (NOTE) SARS-CoV-2 target nucleic acids are NOT DETECTED.  The SARS-CoV-2 RNA is generally detectable in upper  respiratory specimens during the acute phase of infection. The lowest concentration of SARS-CoV-2 viral copies this assay can detect is 138 copies/mL. A negative result does not preclude SARS-Cov-2 infection and should not be used as the sole basis for treatment or other patient management decisions. A negative result may occur with  improper specimen collection/handling, submission of specimen other than nasopharyngeal swab, presence of viral mutation(s) within the areas targeted by this assay, and inadequate number of viral copies(<138 copies/mL). A negative result must be combined with clinical observations, patient history, and epidemiological information. The expected result is Negative.  Fact Sheet for Patients:  EntrepreneurPulse.com.au  Fact Sheet for Healthcare Providers:  IncredibleEmployment.be  This test is no t yet approved or cleared by the [place] FDA and  has been authorized for detection and/or diagnosis of SARS-CoV-2 by FDA under an Emergency Use Authorization (EUA). This EUA will remain  in effect (meaning this test can be used) for the duration of the COVID-19 declaration under Section 564(b)(1) of the Act, 21 U.S.C.section 360bbb-3(b)(1), unless the authorization is terminated  or revoked sooner.       Influenza A by PCR NEGATIVE NEGATIVE Final   Influenza B by PCR NEGATIVE NEGATIVE Final    Comment: (NOTE) The Xpert Xpress SARS-CoV-2/FLU/RSV plus assay is intended as an aid in the diagnosis of influenza from Nasopharyngeal swab specimens and should not be used as a sole basis for treatment. Nasal washings and aspirates are unacceptable for Xpert Xpress SARS-CoV-2/FLU/RSV testing.  Fact Sheet for Patients: EntrepreneurPulse.com.au  Fact Sheet for Healthcare Providers: IncredibleEmployment.be  This test is not yet approved or cleared by the [place] FDA and has been  authorized for detection and/or diagnosis of SARS-CoV-2 by FDA under an Emergency Use Authorization (EUA). This EUA will remain in effect (meaning this test can be used) for the duration of the COVID-19  declaration under Section 564(b)(1) of the Act, 21 U.S.C. section 360bbb-3(b)(1), unless the authorization is terminated or revoked.  Performed at [facility], [address] [place], [place] [address]      Labs: BNP (last 3 results) No results for input(s): BNP in the last 8760 hours. Basic Metabolic Panel: Recent Labs  Lab 11/07/20 1958  NA 136  K 4.0  CL 106  CO2 24  GLUCOSE 91  BUN 20  CREATININE 0.93  CALCIUM 9.4   Liver Function Tests: No results for input(s): AST, ALT, ALKPHOS, BILITOT, PROT, ALBUMIN in the last 168 hours. No results for input(s): LIPASE, AMYLASE in the last 168 hours. No results for input(s): AMMONIA in the last 168 hours. CBC: Recent Labs  Lab 11/07/20 1958  WBC 10.5  HGB 13.0  HCT 39.0  MCV 88.6  PLT 402*   Cardiac Enzymes: No results for input(s): CKTOTAL, CKMB, CKMBINDEX, TROPONINI in the last 168 hours. BNP: Invalid input(s): POCBNP CBG: No results for input(s): GLUCAP in the last 168 hours. D-Dimer No results for input(s): DDIMER in the last 72 hours. Hgb A1c No results for input(s): HGBA1C in the last 72 hours. Lipid Profile No results for input(s): CHOL, HDL, LDLCALC, TRIG, CHOLHDL, LDLDIRECT in the last 72 hours. Thyroid function studies No results for input(s): TSH, T4TOTAL, T3FREE, THYROIDAB in the last 72 hours.  Invalid input(s): FREET3 Anemia work up No results for input(s): VITAMINB12, FOLATE, FERRITIN, TIBC, IRON, RETICCTPCT in the last 72 hours. Urinalysis    Component Value Date/Time   COLORURINE STRAW (A) 11/07/2020 1958   APPEARANCEUR CLEAR (A) 11/07/2020 1958   LABSPEC 1.010 11/07/2020 1958   PHURINE 6.0 11/07/2020 1958   GLUCOSEU NEGATIVE 11/07/2020 1958   GLUCOSEU NEGATIVE 08/04/2018 [place] 11/07/2020 1958   HGBUR negative 04/17/2009 0910   BILIRUBINUR NEGATIVE 11/07/2020 1958   BILIRUBINUR negative 08/19/2018 1046   KETONESUR NEGATIVE 11/07/2020 1958   PROTEINUR NEGATIVE 11/07/2020 1958   UROBILINOGEN 0.2 08/19/2018 1046   UROBILINOGEN 0.2 08/04/2018 1608   NITRITE NEGATIVE 11/07/2020 1958   LEUKOCYTESUR LARGE (A) 11/07/2020 1958   Sepsis Labs Invalid input(s): PROCALCITONIN,  WBC,  LACTICIDVEN Microbiology Recent Results (from the past 240 hour(s))  Resp Panel by RT-PCR (Flu A&B, Covid) Nasopharyngeal Swab     Status: None   Collection Time: 11/08/20  2:02 AM   Specimen: Nasopharyngeal Swab; Nasopharyngeal(NP) swabs in vial transport medium  Result Value Ref Range Status   SARS Coronavirus 2 by RT PCR NEGATIVE NEGATIVE Final    Comment: (NOTE) SARS-CoV-2 target nucleic acids are NOT DETECTED.  The SARS-CoV-2 RNA is generally detectable in upper respiratory specimens during the acute phase of infection. The lowest concentration of SARS-CoV-2 viral copies this assay can detect is 138 copies/mL. A negative result does not preclude SARS-Cov-2 infection and should not be used as the sole basis for treatment or other patient management decisions. A negative result may occur with  improper specimen collection/handling, submission of specimen other than nasopharyngeal swab, presence of viral mutation(s) within the areas targeted by this assay, and inadequate number of viral copies(<138 copies/mL). A negative result must be combined with clinical observations, patient history, and epidemiological information. The expected result is Negative.  Fact Sheet for Patients:  EntrepreneurPulse.com.au  Fact Sheet for Healthcare Providers:  IncredibleEmployment.be  This test is no t yet approved or cleared by the [place] FDA and  has been authorized for detection and/or diagnosis of SARS-CoV-2 by FDA under  an Emergency  Use Authorization (EUA). This EUA will remain  in effect (meaning this test can be used) for the duration of the COVID-19 declaration under Section 564(b)(1) of the Act, 21 U.S.C.section 360bbb-3(b)(1), unless the authorization is terminated  or revoked sooner.       Influenza A by PCR NEGATIVE NEGATIVE Final   Influenza B by PCR NEGATIVE NEGATIVE Final    Comment: (NOTE) The Xpert Xpress SARS-CoV-2/FLU/RSV plus assay is intended as an aid in the diagnosis of influenza from Nasopharyngeal swab specimens and should not be used as a sole basis for treatment. Nasal washings and aspirates are unacceptable for Xpert Xpress SARS-CoV-2/FLU/RSV testing.  Fact Sheet for Patients: EntrepreneurPulse.com.au  Fact Sheet for Healthcare Providers: IncredibleEmployment.be  This test is not yet approved or cleared by the [place] FDA and has been authorized for detection and/or diagnosis of SARS-CoV-2 by FDA under an Emergency Use Authorization (EUA). This EUA will remain in effect (meaning this test can be used) for the duration of the COVID-19 declaration under Section 564(b)(1) of the Act, 21 U.S.C. section 360bbb-3(b)(1), unless the authorization is terminated or revoked.  Performed at [facility], [address], [place], [place] [address]      Time coordinating discharge: Over 30 minutes  SIGNED:   Nolberto Hanlon, MD  [facility] 11/10/2020, 1:13 PM Pager   If 7PM-7AM, please contact night-coverage [email or web address] Password [email or web address]
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
## 2020-11-11 ENCOUNTER — Ambulatory Visit: Payer: Medicare HMO | Admitting: Family Medicine

## 2020-11-11 ENCOUNTER — Encounter: Payer: Self-pay | Admitting: Family Medicine

## 2020-11-12 ENCOUNTER — Telehealth: Payer: Self-pay

## 2020-11-12 NOTE — Telephone Encounter (Signed)
Transition Care Management Follow-up Telephone Call Date of discharge and from where: 11/10/2020-[facility] How have you been since you were released from the hospital? As good as can be expected. Using walker to get around. Any questions or concerns? No  Items Reviewed: Did the pt receive and understand the discharge instructions provided? Yes  Medications obtained and verified? Yes  Other? Yes  Any new allergies since your discharge? No  Dietary orders reviewed? Yes Do you have support at home? No but she states her neighbors check on her daily  Home Care and Equipment/Supplies: Were home health services ordered? no If so, what is the name of the agency? N/a  Has the agency set up a time to come to the patient's home? not applicable Were any new equipment or medical supplies ordered?  No What is the name of the medical supply agency? N/a Were you able to get the supplies/equipment? not applicable Do you have any questions related to the use of the equipment or supplies? N/a  Functional Questionnaire: (I = Independent and D = Dependent) ADLs: I  Bathing/Dressing- I  Meal Prep- I  Eating- I  Maintaining continence- I  Transferring/Ambulation- I WITH WALKER  Managing Meds- I  Follow up appointments reviewed:  PCP Hospital f/u appt confirmed? Yes  Scheduled to see [name] on 11/19/2020 @ 2:20. [facility] f/u appt confirmed? Yes  Scheduled to see Dr. Izora Ribas on 11/14/2020 @ 9:00. Are transportation arrangements needed? No  If their condition worsens, is the pt aware to call PCP or go to the Emergency Dept.? Yes Was the patient provided with contact information for the PCP's office or ED? Yes Was to pt encouraged to call back with questions or concerns? Yes

## 2020-11-14 DIAGNOSIS — Z20822 Contact with and (suspected) exposure to covid-19: Secondary | ICD-10-CM | POA: Diagnosis not present

## 2020-11-14 DIAGNOSIS — M4802 Spinal stenosis, cervical region: Secondary | ICD-10-CM | POA: Diagnosis not present

## 2020-11-14 DIAGNOSIS — M4316 Spondylolisthesis, lumbar region: Secondary | ICD-10-CM | POA: Diagnosis not present

## 2020-11-14 DIAGNOSIS — M4317 Spondylolisthesis, lumbosacral region: Secondary | ICD-10-CM | POA: Diagnosis not present

## 2020-11-14 DIAGNOSIS — R2689 Other abnormalities of gait and mobility: Secondary | ICD-10-CM | POA: Diagnosis not present

## 2020-11-14 DIAGNOSIS — R29898 Other symptoms and signs involving the musculoskeletal system: Secondary | ICD-10-CM | POA: Diagnosis not present

## 2020-11-19 ENCOUNTER — Other Ambulatory Visit: Payer: Self-pay | Admitting: Neurosurgery

## 2020-11-19 ENCOUNTER — Inpatient Hospital Stay: Payer: Medicare HMO | Admitting: Family Medicine

## 2020-11-19 ENCOUNTER — Telehealth: Payer: Self-pay | Admitting: Family Medicine

## 2020-11-19 DIAGNOSIS — R29898 Other symptoms and signs involving the musculoskeletal system: Secondary | ICD-10-CM

## 2020-11-19 DIAGNOSIS — R2689 Other abnormalities of gait and mobility: Secondary | ICD-10-CM

## 2020-11-19 DIAGNOSIS — M4802 Spinal stenosis, cervical region: Secondary | ICD-10-CM

## 2020-11-19 NOTE — Telephone Encounter (Signed)
Received a call today from [facility] with Patient Experience. The patient is having trouble coming to an in person hospital follow up. (There is one sch for 11/14 but that's outside of the [place] window I believe)   She is in desperate need of home health orders to be signed/initiated. Patient Experience asked if this could be done w/o a visit and I said that it requires a visit per Medicare. She contracted Covid once she was out of the hospital so she cancelled the appt she had scheduled for today. She has not been feeling well and is having trouble walking in addition to not having transportation. Is it possible to initiate home health services for her via a virtual visit? If so, can she be worked in one day this week on your schedule or is this appropriate to place on another provider schedule?

## 2020-11-21 ENCOUNTER — Telehealth (INDEPENDENT_AMBULATORY_CARE_PROVIDER_SITE_OTHER): Payer: Medicare HMO | Admitting: Family Medicine

## 2020-11-21 ENCOUNTER — Other Ambulatory Visit: Payer: Self-pay

## 2020-11-21 ENCOUNTER — Encounter: Payer: Self-pay | Admitting: Family Medicine

## 2020-11-21 VITALS — BP 124/80 | HR 72 | Resp 12 | Wt 150.0 lb

## 2020-11-21 DIAGNOSIS — G2581 Restless legs syndrome: Secondary | ICD-10-CM

## 2020-11-21 DIAGNOSIS — F329 Major depressive disorder, single episode, unspecified: Secondary | ICD-10-CM | POA: Diagnosis not present

## 2020-11-21 DIAGNOSIS — M5430 Sciatica, unspecified side: Secondary | ICD-10-CM | POA: Diagnosis not present

## 2020-11-21 DIAGNOSIS — M549 Dorsalgia, unspecified: Secondary | ICD-10-CM

## 2020-11-21 MED ORDER — ARIPIPRAZOLE 5 MG PO TABS: 5.0000 mg | ORAL_TABLET | Freq: Every day | ORAL | 1 refills | Status: DC

## 2020-11-21 MED ORDER — ROPINIROLE HCL 1 MG PO TABS: 1.0000 mg | ORAL_TABLET | Freq: Three times a day (TID) | ORAL | 1 refills | Status: DC
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
## 2020-11-21 NOTE — Assessment & Plan Note (Signed)
Per neuro surgery HH for pt / ot ordered

## 2020-11-21 NOTE — Patient Instructions (Signed)

## 2020-11-24 ENCOUNTER — Ambulatory Visit: Payer: Medicare HMO

## 2020-11-25 ENCOUNTER — Inpatient Hospital Stay: Payer: Medicare HMO | Admitting: Family Medicine

## 2020-11-26 ENCOUNTER — Telehealth: Payer: Medicare HMO

## 2020-11-26 ENCOUNTER — Telehealth: Payer: Self-pay | Admitting: Family Medicine

## 2020-11-26 NOTE — Telephone Encounter (Signed)
Pt. Was returning call from pharmacist. During that time she began to state she needs medical attention because she cant walk, she said it feels like she is paralyzed from the waist down. She said she goes to walk and her legs don't work and she cant lift them.Pt was very panic like and voice sounded like she was trying. Sent to triage for further advice.[name]

## 2020-11-28 ENCOUNTER — Ambulatory Visit (INDEPENDENT_AMBULATORY_CARE_PROVIDER_SITE_OTHER): Payer: Medicare HMO | Admitting: Pharmacist

## 2020-11-28 DIAGNOSIS — Z6834 Body mass index (BMI) 34.0-34.9, adult: Secondary | ICD-10-CM

## 2020-11-28 DIAGNOSIS — I10 Essential (primary) hypertension: Secondary | ICD-10-CM

## 2020-11-28 DIAGNOSIS — R7303 Prediabetes: Secondary | ICD-10-CM

## 2020-11-28 DIAGNOSIS — E782 Mixed hyperlipidemia: Secondary | ICD-10-CM

## 2020-11-28 DIAGNOSIS — F321 Major depressive disorder, single episode, moderate: Secondary | ICD-10-CM

## 2020-11-28 NOTE — Telephone Encounter (Signed)
Spoke with patient. Pt states she was just returning your call and advising that she was still in pain. Pt aware you will call back when you have time.

## 2020-11-28 NOTE — Telephone Encounter (Signed)
Patient called - see Chronic Care Management phone visit notes.

## 2020-11-28 NOTE — Patient Instructions (Addendum)
Mrs. Kossman  Thank you for taking time to visit with me today. Please don't hesitate to contact me if I can be of assistance to you before our next scheduled telephone appointment.  Telephone follow up appointment with care management team member scheduled for: January 28, 2021 at 1:45pm  If you need to cancel or re-schedule our visit, please call [phone] and our care guide team will be happy to assist you.  I have attached a summary of our visit today and information about your health goals.  If you have any questions or concerns, please feel free to contact me either at the phone number below or with a MyChart message.   I hope you feel better soon!  [name], PharmD Clinical Pharmacist [facility] [phone] (direct line)  [phone] (main office number)  Hypertension BP Readings from Last 3 Encounters:  11/21/20 124/80  11/10/20 104/66  11/07/20 (!) 143/70   Pharmacist Clinical Goal(s): Over the next 90 days, patient will work with PharmD and providers to maintain BP goal <140/90 Current regimen:  Hydrochlorothiazide 25mg  daily  Patient self care activities - Over the next 90 days, patient will: Ensure daily salt intake < 2300 mg/day Maintain hypertension medication regimen.  Continue to check BP at least weekly and record.  Encouraged exercise program / walking with goal to increase as able to 150 minutes per week (this goal on hold until workup for back and leg pain and weakness is completed / resolved)    Hyperlipidemia Lab Results  Component Value Date/Time   LDLCALC 77 01/23/2020 10:35 AM   LDLCALC 128 (H) 09/30/2018 03:26 PM   LDLDIRECT 131.0 10/01/2016 03:49 PM   Pharmacist Clinical Goal(s): Over the next 90 days, patient will work with PharmD and providers to maintain LDL goal < 100 Current regimen:  Fenofibrate 160mg  daily Patient self care activities - Over the next 90 days, patient will: Maintain cholesterol  medication regimen.   Pre-Diabetes with neuropathy Lab Results  Component Value Date/Time   HGBA1C 5.7 (H) 06/12/2020 11:34 AM   HGBA1C 5.9 (H) 01/23/2020 10:35 AM   Pharmacist Clinical Goal(s): Over the next 90 days, patient will work with PharmD and providers to maintain A1c goal <6.5% Current regimen:  Pregabalin 50mg  - take 2 capsules twice a day as needed Ozempic 0.5mg  subcutaneously once per week Patient self care activities - Over the next 90 days, patient will: Limit carbohydrate intake (30-45 grams per meal) Reviewed A1c and blood sugar goals; Encouraged exercise program / walking with goal to increase as able to 150 minutes per week (this goal on hold until workup for back and leg pain and weakness is completed / resolved)   Depression Pharmacist Clinical Goal(s): Over the next 90 days, patient will work with PharmD and providers to reduce symptoms of depression/anxiety Current regimen:  Abilify 5mg  daily (take prior to sleep) Escitalopram 20mg  daily (take during waking hours) Zoplidem 10mg  - take 0.5 tablet = 5mg  at night, if awakes at night will take other half tablet to equal total dose of 10mg  Patient self care activities - Over the next 90 days, patient will: Continue current regimen.  Consider grief counseling   Osteopenia:  Pharmacist Clinical Goal(s) Prevent falls and fracture Current regimen:  Alendronate 70mg  - take 1 tablet weekly  Vitamin D 2000 IU daily  Patient self care activities - Over the next 90 days, patient will: Continue current therapy Consider rechecking DEXA in 2023   Medication management Pharmacist  Clinical Goal(s): Over the next 90 days, patient will work with PharmD and providers to maintain optimal medication adherence Current pharmacy: [facility]  [facility] Comprehensive medication review performed. Continue current medication management strategy Reviewed patient's current prescription formulary. Hyoscyamine is  not included in her formulary. I did checked GoodRx and she could get for about $10 per 30 tablets by using GoodRx card.  Patient self care activities - Over the next 90 days, patient will: Focus on medication adherence by filling medications appropriately  Take medications as prescribed Report any questions or concerns to PharmD and/or provider(s)  The patient verbalized understanding of instructions, educational materials, and care plan provided today and agreed to receive a mailed copy of patient instructions, educational materials, and care plan.
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
## 2020-12-03 ENCOUNTER — Telehealth: Payer: Self-pay | Admitting: Family Medicine

## 2020-12-03 ENCOUNTER — Ambulatory Visit
Admission: RE | Admit: 2020-12-03 | Discharge: 2020-12-03 | Disposition: A | Discharge status: Home / Self Care | Payer: Medicare HMO | Source: Ambulatory Visit | Attending: Neurosurgery | Admitting: Neurosurgery

## 2020-12-03 ENCOUNTER — Observation Stay
Admission: EM | Admit: 2020-12-03 | Discharge: 2020-12-05 | Disposition: A | Discharge status: Home / Self Care | Payer: Medicare HMO | Attending: Internal Medicine | Admitting: Internal Medicine

## 2020-12-03 ENCOUNTER — Other Ambulatory Visit: Payer: Self-pay

## 2020-12-03 DIAGNOSIS — M50223 Other cervical disc displacement at C6-C7 level: Secondary | ICD-10-CM | POA: Diagnosis not present

## 2020-12-03 DIAGNOSIS — R2689 Other abnormalities of gait and mobility: Secondary | ICD-10-CM | POA: Insufficient documentation

## 2020-12-03 DIAGNOSIS — Z79899 Other long term (current) drug therapy: Secondary | ICD-10-CM | POA: Diagnosis not present

## 2020-12-03 DIAGNOSIS — M5124 Other intervertebral disc displacement, thoracic region: Secondary | ICD-10-CM | POA: Diagnosis not present

## 2020-12-03 DIAGNOSIS — J449 Chronic obstructive pulmonary disease, unspecified: Secondary | ICD-10-CM | POA: Diagnosis not present

## 2020-12-03 DIAGNOSIS — M5440 Lumbago with sciatica, unspecified side: Secondary | ICD-10-CM | POA: Diagnosis not present

## 2020-12-03 DIAGNOSIS — R29898 Other symptoms and signs involving the musculoskeletal system: Secondary | ICD-10-CM

## 2020-12-03 DIAGNOSIS — R200 Anesthesia of skin: Secondary | ICD-10-CM | POA: Diagnosis not present

## 2020-12-03 DIAGNOSIS — I10 Essential (primary) hypertension: Secondary | ICD-10-CM | POA: Insufficient documentation

## 2020-12-03 DIAGNOSIS — G8929 Other chronic pain: Secondary | ICD-10-CM

## 2020-12-03 DIAGNOSIS — M549 Dorsalgia, unspecified: Secondary | ICD-10-CM

## 2020-12-03 DIAGNOSIS — U071 COVID-19: Secondary | ICD-10-CM | POA: Diagnosis not present

## 2020-12-03 DIAGNOSIS — M4319 Spondylolisthesis, multiple sites in spine: Secondary | ICD-10-CM | POA: Diagnosis not present

## 2020-12-03 DIAGNOSIS — J302 Other seasonal allergic rhinitis: Secondary | ICD-10-CM

## 2020-12-03 DIAGNOSIS — Z87891 Personal history of nicotine dependence: Secondary | ICD-10-CM | POA: Diagnosis not present

## 2020-12-03 DIAGNOSIS — M48061 Spinal stenosis, lumbar region without neurogenic claudication: Secondary | ICD-10-CM

## 2020-12-03 DIAGNOSIS — Z20822 Contact with and (suspected) exposure to covid-19: Secondary | ICD-10-CM | POA: Insufficient documentation

## 2020-12-03 DIAGNOSIS — R262 Difficulty in walking, not elsewhere classified: Secondary | ICD-10-CM | POA: Diagnosis not present

## 2020-12-03 DIAGNOSIS — M5430 Sciatica, unspecified side: Secondary | ICD-10-CM

## 2020-12-03 DIAGNOSIS — K219 Gastro-esophageal reflux disease without esophagitis: Secondary | ICD-10-CM

## 2020-12-03 DIAGNOSIS — M4802 Spinal stenosis, cervical region: Secondary | ICD-10-CM

## 2020-12-03 NOTE — ED Triage Notes (Signed)
Pt has had multiple visits for lower back/hip pain and states she has been having issues walking with weakness and pain, pt has been seen by neurosurgery and suppose to have PT tomorrow.

## 2020-12-03 NOTE — Telephone Encounter (Signed)
Caller/Agency: [facility] Number: [phone] Requesting OT/PT/Skilled Nursing/Social Work/Speech Therapy: OT Frequency:  1x a week for 4weeks

## 2020-12-03 NOTE — Telephone Encounter (Signed)
Verbal given 

## 2020-12-04 ENCOUNTER — Observation Stay (HOSPITAL_BASED_OUTPATIENT_CLINIC_OR_DEPARTMENT_OTHER)
Admit: 2020-12-04 | Discharge: 2020-12-04 | Disposition: A | Discharge status: Home / Self Care | Payer: Medicare HMO | Attending: Internal Medicine | Admitting: Internal Medicine

## 2020-12-04 ENCOUNTER — Encounter: Payer: Self-pay | Admitting: Family Medicine

## 2020-12-04 DIAGNOSIS — M5459 Other low back pain: Secondary | ICD-10-CM | POA: Diagnosis not present

## 2020-12-04 DIAGNOSIS — M4802 Spinal stenosis, cervical region: Secondary | ICD-10-CM | POA: Diagnosis not present

## 2020-12-04 DIAGNOSIS — M5415 Radiculopathy, thoracolumbar region: Secondary | ICD-10-CM | POA: Diagnosis not present

## 2020-12-04 DIAGNOSIS — U071 COVID-19: Secondary | ICD-10-CM | POA: Diagnosis not present

## 2020-12-04 DIAGNOSIS — E785 Hyperlipidemia, unspecified: Secondary | ICD-10-CM | POA: Diagnosis not present

## 2020-12-04 DIAGNOSIS — R262 Difficulty in walking, not elsewhere classified: Secondary | ICD-10-CM | POA: Diagnosis not present

## 2020-12-04 DIAGNOSIS — R0609 Other forms of dyspnea: Secondary | ICD-10-CM

## 2020-12-04 DIAGNOSIS — M5430 Sciatica, unspecified side: Secondary | ICD-10-CM | POA: Diagnosis not present

## 2020-12-04 LAB — ECHOCARDIOGRAM COMPLETE
AR max vel: 1.67 cm2
AV Area VTI: 1.73 cm2
AV Area mean vel: 1.59 cm2
AV Mean grad: 6 mmHg
AV Peak grad: 11.2 mmHg
Ao pk vel: 1.67 m/s
Area-P 1/2: 3.61 cm2
Height: 55 in
MV VTI: 2.16 cm2
S' Lateral: 2.9 cm
Weight: 2451.52 oz

## 2020-12-04 LAB — BASIC METABOLIC PANEL
Anion gap: 5 (ref 5–15)
Anion gap: 5 (ref 5–15)
BUN: 17 mg/dL (ref 8–23)
BUN: 16 mg/dL (ref 8–23)
CO2: 26 mmol/L (ref 22–32)
CO2: 29 mmol/L (ref 22–32)
Calcium: 9.1 mg/dL (ref 8.9–10.3)
Calcium: 9.3 mg/dL (ref 8.9–10.3)
Chloride: 107 mmol/L (ref 98–111)
Chloride: 107 mmol/L (ref 98–111)
Creatinine, Ser: 0.82 mg/dL (ref 0.44–1.00)
Creatinine, Ser: 0.81 mg/dL (ref 0.44–1.00)
GFR, Estimated: >60 mL/min (ref >60)
GFR, Estimated: >60 mL/min (ref >60)
Glucose, Bld: 99 mg/dL (ref 70–99)
Glucose, Bld: 115 mg/dL — ABNORMAL HIGH (ref 70–99)
Potassium: 3.9 mmol/L (ref 3.5–5.1)
Potassium: 4 mmol/L (ref 3.5–5.1)
Sodium: 138 mmol/L (ref 135–145)
Sodium: 141 mmol/L (ref 135–145)

## 2020-12-04 LAB — CBC WITH DIFFERENTIAL/PLATELET
Abs Immature Granulocytes: 0.02 10*3/uL (ref 0.00–0.07)
Basophils Absolute: 0.1 10*3/uL (ref 0.0–0.1)
Basophils Relative: 1 %
Eosinophils Absolute: 0.5 10*3/uL (ref 0.0–0.5)
Eosinophils Relative: 9 %
HCT: 38.2 % (ref 36.0–46.0)
Hemoglobin: 12.1 g/dL (ref 12.0–15.0)
Immature Granulocytes: 0 %
Lymphocytes Relative: 39 %
Lymphs Abs: 2.3 10*3/uL (ref 0.7–4.0)
MCH: 28.7 pg (ref 26.0–34.0)
MCHC: 31.7 g/dL (ref 30.0–36.0)
MCV: 90.5 fL (ref 80.0–100.0)
Monocytes Absolute: 0.6 10*3/uL (ref 0.1–1.0)
Monocytes Relative: 11 %
Neutro Abs: 2.4 10*3/uL (ref 1.7–7.7)
Neutrophils Relative %: 40 %
Platelets: 313 10*3/uL (ref 150–400)
RBC: 4.22 MIL/uL (ref 3.87–5.11)
RDW: 13.3 % (ref 11.5–15.5)
WBC: 6 10*3/uL (ref 4.0–10.5)
nRBC: 0 % (ref 0.0–0.2)

## 2020-12-04 LAB — CBC
HCT: 39.2 % (ref 36.0–46.0)
Hemoglobin: 12.5 g/dL (ref 12.0–15.0)
MCH: 28.5 pg (ref 26.0–34.0)
MCHC: 31.9 g/dL (ref 30.0–36.0)
MCV: 89.5 fL (ref 80.0–100.0)
Platelets: 325 10*3/uL (ref 150–400)
RBC: 4.38 MIL/uL (ref 3.87–5.11)
RDW: 13.2 % (ref 11.5–15.5)
WBC: 6.3 10*3/uL (ref 4.0–10.5)
nRBC: 0 % (ref 0.0–0.2)

## 2020-12-04 LAB — HEMOGLOBIN A1C
Hgb A1c MFr Bld: 5.4 % (ref 4.8–5.6)
Mean Plasma Glucose: 108.28 mg/dL

## 2020-12-04 LAB — LACTATE DEHYDROGENASE: LDH: 153 U/L (ref 98–192)

## 2020-12-04 LAB — FIBRINOGEN: Fibrinogen: 285 mg/dL (ref 210–475)

## 2020-12-04 LAB — RESP PANEL BY RT-PCR (FLU A&B, COVID) ARPGX2
Influenza A by PCR: NEGATIVE
Influenza B by PCR: NEGATIVE
SARS Coronavirus 2 by RT PCR: POSITIVE — AB

## 2020-12-04 LAB — GLUCOSE, CAPILLARY
Glucose-Capillary: 87 mg/dL (ref 70–99)
Glucose-Capillary: 100 mg/dL — ABNORMAL HIGH (ref 70–99)

## 2020-12-04 LAB — D-DIMER, QUANTITATIVE: D-Dimer, Quant: 0.8 ug/mL-FEU — ABNORMAL HIGH (ref 0.00–0.50)

## 2020-12-04 LAB — PROCALCITONIN: Procalcitonin: <0.1 ng/mL

## 2020-12-04 LAB — FERRITIN: Ferritin: 158 ng/mL (ref 11–307)

## 2020-12-04 LAB — C-REACTIVE PROTEIN: CRP: 0.9 mg/dL (ref <1.0)

## 2020-12-04 MED ORDER — FENOFIBRATE 160 MG PO TABS
160.0000 mg | ORAL_TABLET | Freq: Every day | ORAL | Status: DC
  Administered 2020-12-04 – 2020-12-05 (×2): 160 mg via ORAL
  Filled 2020-12-04 (×2): qty 1

## 2020-12-04 MED ORDER — PANTOPRAZOLE SODIUM 40 MG PO TBEC
40.0000 mg | DELAYED_RELEASE_TABLET | Freq: Every day | ORAL | Status: DC
  Administered 2020-12-04 – 2020-12-05 (×2): 40 mg via ORAL
  Filled 2020-12-04 (×2): qty 1

## 2020-12-04 MED ORDER — CALCIUM CARBONATE ANTACID 500 MG PO CHEW
2.0000 | CHEWABLE_TABLET | ORAL | Status: DC | PRN
  Administered 2020-12-04: 400 mg via ORAL
  Filled 2020-12-04: qty 2

## 2020-12-04 MED ORDER — TRAMADOL HCL 50 MG PO TABS
100.0000 mg | ORAL_TABLET | Freq: Two times a day (BID) | ORAL | Status: DC | PRN
  Administered 2020-12-04 – 2020-12-05 (×2): 100 mg via ORAL
  Filled 2020-12-04 (×2): qty 2

## 2020-12-04 MED ORDER — ACETAMINOPHEN 325 MG PO TABS: 650.0000 mg | ORAL_TABLET | Freq: Four times a day (QID) | ORAL | Status: DC | PRN

## 2020-12-04 MED ORDER — VITAMIN D 25 MCG (1000 UNIT) PO TABS
2000.0000 [IU] | ORAL_TABLET | Freq: Every day | ORAL | Status: DC
  Administered 2020-12-04 – 2020-12-05 (×2): 2000 [IU] via ORAL
  Filled 2020-12-04 (×2): qty 2

## 2020-12-04 MED ORDER — SODIUM CHLORIDE 0.9 % IV SOLN: INTRAVENOUS | Status: DC

## 2020-12-04 MED ORDER — ARIPIPRAZOLE 5 MG PO TABS
5.0000 mg | ORAL_TABLET | Freq: Every day | ORAL | Status: DC
  Administered 2020-12-04 – 2020-12-05 (×2): 5 mg via ORAL
  Filled 2020-12-04 (×2): qty 1

## 2020-12-04 MED ORDER — LINACLOTIDE 145 MCG PO CAPS
145.0000 ug | ORAL_CAPSULE | Freq: Every day | ORAL | Status: DC
  Administered 2020-12-04 – 2020-12-05 (×2): 145 ug via ORAL
  Filled 2020-12-04 (×2): qty 1

## 2020-12-04 MED ORDER — ALBUTEROL SULFATE (2.5 MG/3ML) 0.083% IN NEBU
2.5000 mg | INHALATION_SOLUTION | RESPIRATORY_TRACT | Status: DC | PRN
  Administered 2020-12-04 – 2020-12-05 (×3): 2.5 mg via RESPIRATORY_TRACT
  Filled 2020-12-04 (×3): qty 3

## 2020-12-04 MED ORDER — ADULT MULTIVITAMIN W/MINERALS CH
1.0000 | ORAL_TABLET | Freq: Every day | ORAL | Status: DC
  Administered 2020-12-04 – 2020-12-05 (×2): 1 via ORAL
  Filled 2020-12-04 (×2): qty 1

## 2020-12-04 MED ORDER — INFLUENZA VAC A&B SA ADJ QUAD 0.5 ML IM PRSY
0.5000 mL | PREFILLED_SYRINGE | INTRAMUSCULAR | Status: DC
  Filled 2020-12-04: qty 0.5

## 2020-12-04 MED ORDER — SODIUM CHLORIDE 0.9 % IV SOLN
200.0000 mg | Freq: Once | INTRAVENOUS | Status: DC
  Filled 2020-12-04: qty 40

## 2020-12-04 MED ORDER — ENOXAPARIN SODIUM 40 MG/0.4ML IJ SOSY
0.5000 mg/kg | PREFILLED_SYRINGE | INTRAMUSCULAR | Status: DC
  Administered 2020-12-04 – 2020-12-05 (×2): 32.5 mg via SUBCUTANEOUS
  Filled 2020-12-04 (×2): qty 0.4

## 2020-12-04 MED ORDER — FENTANYL CITRATE PF 50 MCG/ML IJ SOSY: 25.0000 ug | PREFILLED_SYRINGE | INTRAMUSCULAR | Status: DC | PRN

## 2020-12-04 MED ORDER — FLUTICASONE PROPIONATE HFA 110 MCG/ACT IN AERO: 1.0000 | INHALATION_SPRAY | Freq: Two times a day (BID) | RESPIRATORY_TRACT | Status: DC

## 2020-12-04 MED ORDER — ALENDRONATE SODIUM 70 MG PO TABS: 70.0000 mg | ORAL_TABLET | ORAL | Status: DC

## 2020-12-04 MED ORDER — ESCITALOPRAM OXALATE 20 MG PO TABS
20.0000 mg | ORAL_TABLET | Freq: Every day | ORAL | Status: DC
  Administered 2020-12-04 – 2020-12-05 (×2): 20 mg via ORAL
  Filled 2020-12-04 (×2): qty 1

## 2020-12-04 MED ORDER — GUAIFENESIN-DM 100-10 MG/5ML PO SYRP: 10.0000 mL | ORAL_SOLUTION | ORAL | Status: DC | PRN

## 2020-12-04 MED ORDER — HYOSCYAMINE SULFATE 0.125 MG SL SUBL
0.1250 mg | SUBLINGUAL_TABLET | SUBLINGUAL | Status: DC | PRN
  Administered 2020-12-04 – 2020-12-05 (×2): 0.125 mg via SUBLINGUAL
  Filled 2020-12-04 (×3): qty 1

## 2020-12-04 MED ORDER — HYDROCHLOROTHIAZIDE 25 MG PO TABS
25.0000 mg | ORAL_TABLET | Freq: Every day | ORAL | Status: DC
  Administered 2020-12-04: 25 mg via ORAL
  Filled 2020-12-04: qty 1

## 2020-12-04 MED ORDER — ZOLPIDEM TARTRATE 5 MG PO TABS
5.0000 mg | ORAL_TABLET | Freq: Every evening | ORAL | Status: DC | PRN
  Administered 2020-12-04: 5 mg via ORAL
  Filled 2020-12-04: qty 1

## 2020-12-04 MED ORDER — ROPINIROLE HCL 1 MG PO TABS
1.0000 mg | ORAL_TABLET | Freq: Three times a day (TID) | ORAL | Status: DC
  Administered 2020-12-04 – 2020-12-05 (×4): 1 mg via ORAL
  Filled 2020-12-04 (×4): qty 1

## 2020-12-04 MED ORDER — KETOROLAC TROMETHAMINE 30 MG/ML IJ SOLN: 15.0000 mg | Freq: Four times a day (QID) | INTRAMUSCULAR | Status: DC | PRN

## 2020-12-04 MED ORDER — MAGNESIUM HYDROXIDE 400 MG/5ML PO SUSP: 30.0000 mL | Freq: Every day | ORAL | Status: DC | PRN

## 2020-12-04 MED ORDER — ACETAMINOPHEN 650 MG RE SUPP: 650.0000 mg | Freq: Four times a day (QID) | RECTAL | Status: DC | PRN

## 2020-12-04 MED ORDER — ONDANSETRON HCL 4 MG PO TABS: 4.0000 mg | ORAL_TABLET | Freq: Four times a day (QID) | ORAL | Status: DC | PRN

## 2020-12-04 MED ORDER — HYDROCOD POLST-CPM POLST ER 10-8 MG/5ML PO SUER
5.0000 mL | Freq: Two times a day (BID) | ORAL | Status: DC | PRN
  Administered 2020-12-05: 5 mL via ORAL
  Filled 2020-12-04: qty 5

## 2020-12-04 MED ORDER — FLUTICASONE PROPIONATE 50 MCG/ACT NA SUSP
2.0000 | Freq: Every day | NASAL | Status: DC
  Administered 2020-12-04: 2 via NASAL
  Filled 2020-12-04: qty 16

## 2020-12-04 MED ORDER — ENOXAPARIN SODIUM 300 MG/3ML IJ SOLN
0.5000 mg/kg | INTRAMUSCULAR | Status: DC
  Filled 2020-12-04: qty 0.35

## 2020-12-04 MED ORDER — ASCORBIC ACID 500 MG PO TABS
500.0000 mg | ORAL_TABLET | Freq: Every day | ORAL | Status: DC
  Administered 2020-12-04 – 2020-12-05 (×2): 500 mg via ORAL
  Filled 2020-12-04 (×2): qty 1

## 2020-12-04 MED ORDER — CYCLOBENZAPRINE HCL 10 MG PO TABS
10.0000 mg | ORAL_TABLET | Freq: Two times a day (BID) | ORAL | Status: DC | PRN
  Administered 2020-12-04: 10 mg via ORAL
  Filled 2020-12-04: qty 1

## 2020-12-04 MED ORDER — FLUCONAZOLE 50 MG PO TABS
150.0000 mg | ORAL_TABLET | Freq: Once | ORAL | Status: AC
  Administered 2020-12-04: 150 mg via ORAL
  Filled 2020-12-04: qty 1

## 2020-12-04 MED ORDER — INSULIN ASPART 100 UNIT/ML IJ SOLN: 0.0000 [IU] | Freq: Three times a day (TID) | INTRAMUSCULAR | Status: DC

## 2020-12-04 MED ORDER — SODIUM CHLORIDE 0.9 % IV SOLN: 100.0000 mg | Freq: Every day | INTRAVENOUS | Status: DC

## 2020-12-04 MED ORDER — MENTHOL 3 MG MT LOZG
1.0000 | LOZENGE | OROMUCOSAL | Status: DC | PRN
  Filled 2020-12-04: qty 9

## 2020-12-04 MED ORDER — ONDANSETRON HCL 4 MG/2ML IJ SOLN: 4.0000 mg | Freq: Four times a day (QID) | INTRAMUSCULAR | Status: DC | PRN

## 2020-12-04 MED ORDER — ZINC SULFATE 220 (50 ZN) MG PO CAPS
220.0000 mg | ORAL_CAPSULE | Freq: Every day | ORAL | Status: DC
  Administered 2020-12-04 – 2020-12-05 (×2): 220 mg via ORAL
  Filled 2020-12-04 (×2): qty 1

## 2020-12-04 MED ORDER — PREGABALIN 50 MG PO CAPS
100.0000 mg | ORAL_CAPSULE | Freq: Two times a day (BID) | ORAL | Status: DC
  Administered 2020-12-04 – 2020-12-05 (×4): 100 mg via ORAL
  Filled 2020-12-04 (×4): qty 2

## 2020-12-04 MED ORDER — VITAMIN D (ERGOCALCIFEROL) 1.25 MG (50000 UNIT) PO CAPS
50000.0000 [IU] | ORAL_CAPSULE | ORAL | Status: DC
  Filled 2020-12-04: qty 1

## 2020-12-04 MED ORDER — BUDESONIDE 0.25 MG/2ML IN SUSP: 0.2500 mg | Freq: Two times a day (BID) | RESPIRATORY_TRACT | Status: DC

## 2020-12-04 MED ORDER — HYDROCODONE-ACETAMINOPHEN 10-325 MG PO TABS
1.0000 | ORAL_TABLET | Freq: Three times a day (TID) | ORAL | Status: DC | PRN
  Administered 2020-12-04 – 2020-12-05 (×4): 1 via ORAL
  Filled 2020-12-04 (×4): qty 1

## 2020-12-04 MED ORDER — CALCIUM CARBONATE ANTACID 500 MG PO CHEW
1.0000 | CHEWABLE_TABLET | ORAL | Status: DC | PRN
  Administered 2020-12-04: 200 mg via ORAL
  Filled 2020-12-04: qty 1

## 2020-12-04 MED ORDER — TRAZODONE HCL 50 MG PO TABS
25.0000 mg | ORAL_TABLET | Freq: Every evening | ORAL | Status: DC | PRN
  Administered 2020-12-04: 25 mg via ORAL
  Filled 2020-12-04 (×2): qty 1

## 2020-12-04 NOTE — Progress Notes (Signed)
Anticoagulation monitoring(Lovenox):  70 yo female ordered Lovenox 40 mg Q24h    Filed Weights   12/03/20 1700  Weight: 67.1 kg (148 lb)   BMI 34.4   Lab Results  Component Value Date   CREATININE 0.82 12/04/2020   CREATININE 0.93 11/07/2020   CREATININE 1.12 (H) 06/12/2020   Estimated Creatinine Clearance: 47.6 mL/min (by C-G formula based on SCr of 0.82 mg/dL). Hemoglobin & Hematocrit     Component Value Date/Time   HGB 12.1 12/04/2020 0019   HGB 13.6 06/12/2020 1134   HCT 38.2 12/04/2020 0019   HCT 41.7 06/12/2020 1134     Per Protocol for Patient with estCrcl < 30 ml/min and BMI > 30, will transition to Lovenox 35 mg Q24h.

## 2020-12-04 NOTE — TOC Progression Note (Addendum)
Transition of Care [facility]) - Progression Note    Patient Details  Name: [name] MRN: [id number] Date of Birth: 15-Aug-1950  Transition of Care [facility]) CM/SW [place], RN Phone Number: 12/04/2020, 11:03 AM  Clinical Narrative:    The patient was recently discharged home and saw Neurosurgery, She was scheduled to have PT but came into the hospital due to living alone pain and fear of falling due to weakness, Neurosurgery is Consulted, TOC to follow for DC planning and needs She has a rolling walker at home and uses it at [facility]. Apolonio Schneiders w/M [facility] said they are following pt for OT & PT. She can be reached  at [phone] if pt. wishes to continue services please send resumption of orders at DC       Expected Discharge Plan and Services                                                 Social Determinants of Health (SDOH) Interventions    Readmission Risk Interventions No flowsheet data found.

## 2020-12-04 NOTE — Plan of Care (Signed)
°  Problem: Clinical Measurements: °Goal: Ability to maintain clinical measurements within normal limits will improve °Outcome: Progressing °  °Problem: Clinical Measurements: °Goal: Diagnostic test results will improve °Outcome: Progressing °  °
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
## 2020-12-04 NOTE — Progress Notes (Signed)

## 2020-12-04 NOTE — Progress Notes (Signed)
*  PRELIMINARY RESULTS* Echocardiogram 2D Echocardiogram has been performed.  [name] 12/04/2020, 2:26 PM

## 2020-12-04 NOTE — ED Notes (Signed)
Pt ambulated with standby assistance. Could only take small steps without rollator

## 2020-12-04 NOTE — Progress Notes (Signed)
Patient seen and examined at the bedside.  He complains of shortness of breath with exertion for the past few weeks.  She tested positive for COVID-19 infection ([facility] [address]) about 2 weeks prior to admission.  Neurosurgery recommends conservative management.  2D echo has been ordered for further evaluation for evaluation of exertional dyspnea.  She is tolerating room air.  No indication for treatment for COVID-19 infection at this time.  Discontinue IV remdesivir.  PT recommends home health at discharge.

## 2020-12-04 NOTE — Evaluation (Signed)
Physical Therapy Evaluation Patient Details Name: [name] MRN: [id number] DOB: 08-13-1950 Today's Date: 12/04/2020  History of Present Illness  presented to ER secondary to low back/LE pain, paresthesia to bilat LEs, inability to ambulate; admitted for management of intractable back pain with bilat LE weakness/paresthesias.  Clinical Impression  Patient resting in bed upon arrival to room; alert and oriented, follow commands and agreeable to participation with session.  Frequent reference to pain in low back, LEs; however, functional performance and overall movement somewhat inconsistent with perceived pain rating.  Able to mobilize bilat LEs symmetrically against gravity (grossly 4-/5 throughout); endorses mild tingling/paresthesia L lateral calf.  Denies saddle paresthesia or changes to bowel/bladder since increase in pain.  Does demonstrates mild/mod weakness to post/lateral hips in closed chain activities.  Does endorse some point tenderness over L posterior/lateral hip (near piriformis); tolerated hamstring/piriformis stretching well. Able to complete bed mobility with mod indep; sit/stand, basic transfers and gait (50' x1, 75' x1) with rollator, cga/clsoe sup.  Demonstrates narrowed BOS with partially reciprocal stepping pattern, noted weakness in hip stabilization in stance phase (R > L) with mild/mod pelvic on hip abduct in loading.  Cautious gait performance, but no overt buckling or LOB. Patient generally hyperaware/focused on medical conditions and symptoms, but responds well to interventions and activity progression. Would benefit from skilled PT to address above deficits and promote optimal return to PLOF.; Recommend transition to HHPT upon discharge from acute hospitalization to address hip/LE weakness and higher level balance.      Recommendations for follow up therapy are one component of a multi-disciplinary discharge planning process, led by the attending physician.   Recommendations may be updated based on patient status, additional functional criteria and insurance authorization.  Follow Up Recommendations Home health PT    Assistance Recommended at Discharge PRN  Functional Status Assessment Patient has had a recent decline in their functional status and demonstrates the ability to make significant improvements in function in a reasonable and predictable amount of time.  Equipment Recommendations       Recommendations for Other Services       Precautions / Restrictions Precautions Precautions: Fall Restrictions Weight Bearing Restrictions: No      Mobility  Bed Mobility Overal bed mobility: Modified Independent                  Transfers Overall transfer level: Modified independent Equipment used: Rollator (4 wheels)               General transfer comment: tends to pull on RW    Ambulation/Gait Ambulation/Gait assistance: Supervision Gait Distance (Feet):  (50' x1, 75' x1) Assistive device: Rollator (4 wheels)         General Gait Details: narrowed BOS with partially reciprocal stepping pattern, noted weakness in hip stabilization in stance phase (R > L) with mild/mod pelvic on hip abduct in loading.  Cautious gait performance, but no overt buckling or LOB  Stairs            Wheelchair Mobility    Modified Rankin (Stroke Patients Only)       Balance Overall balance assessment: Needs assistance Sitting-balance support: No upper extremity supported;Feet supported Sitting balance-Leahy Scale: Good     Standing balance support: Bilateral upper extremity supported Standing balance-Leahy Scale: Fair                               Pertinent Vitals/Pain Pain Assessment:  Faces Faces Pain Scale: Hurts a little bit Pain Location: LEs Pain Descriptors / Indicators: Aching;Grimacing;Guarding Pain Intervention(s): Limited activity within patient's tolerance;Monitored during session;Repositioned     Home Living Family/patient expects to be discharged to:: Private residence Living Arrangements: Alone Available Help at Discharge: Neighbor;Friend(s);Available PRN/intermittently Type of Home: Apartment Home Access: Level entry       Home Layout: One level Home Equipment: Rollator (4 wheels)      Prior Function Prior Level of Function : Independent/Modified Independent             Mobility Comments: Indep without assist at baseline, working as [employer]; has been using rollator for previous month due to worsening LE pain/weakness.  Does endorse multiple fall history, attributed to 'tripping over things' with L LE.       Hand Dominance        Extremity/Trunk Assessment   Upper Extremity Assessment Upper Extremity Assessment: Overall WFL for tasks assessed    Lower Extremity Assessment Lower Extremity Assessment:  (bilat hips grossly 3+ to 4-/5 in closed-chain position (noted pelvis on hip abduct in stance, R > L); otherwise, LEs grossly 4/5 throughout.  Mild tingling reported L lateral calf; denies sensory paresthesia or difficulty with bowel/bladder)       Communication   Communication: No difficulties  Cognition Arousal/Alertness: Awake/alert Behavior During Therapy: WFL for tasks assessed/performed Overall Cognitive Status: Within Functional Limits for tasks assessed                                 General Comments: Generally hyperaware of medical complaints/symptoms        General Comments      Exercises Other Exercises Other Exercises: 33' without assist device,min assist--very short, shuffling steps (significant decrease in step height/length when walker removed). Unsteady and unsafe to complete without assist device; patient voiced awareness/agreement. Other Exercises: Bilat hamstring and piriformis stretching, 3x30 seconds   Assessment/Plan    PT Assessment Patient needs continued PT services  PT Problem List Decreased  strength;Decreased range of motion;Decreased balance;Decreased activity tolerance;Decreased mobility;Pain       PT Treatment Interventions DME instruction;Gait training;Stair training;Functional mobility training;Therapeutic activities;Therapeutic exercise;Balance training;Patient/family education    PT Goals (Current goals can be found in the Care Plan section)  Acute Rehab PT Goals Patient Stated Goal: to be able to walk and go back to work again PT Goal Formulation: With patient Time For Goal Achievement: 12/18/20 Potential to Achieve Goals: Good    Frequency Min 2X/week   Barriers to discharge        Co-evaluation               AM-PAC PT "6 Clicks" Mobility  Outcome Measure Help needed turning from your back to your side while in a flat bed without using bedrails?: None Help needed moving from lying on your back to sitting on the side of a flat bed without using bedrails?: None Help needed moving to and from a bed to a chair (including a wheelchair)?: None Help needed standing up from a chair using your arms (e.g., wheelchair or bedside chair)?: None Help needed to walk in hospital room?: None Help needed climbing 3-5 steps with a railing? : A Little 6 Click Score: 23    End of Session Equipment Utilized During Treatment: Gait belt Activity Tolerance: Patient tolerated treatment well Patient left: in bed;with call bell/phone within reach;with bed alarm set Nurse Communication: Mobility status PT  Visit Diagnosis: Muscle weakness (generalized) (M62.81);Difficulty in walking, not elsewhere classified (R26.2);Pain Pain - Right/Left: Left Pain - part of body: Leg    Time: 1116-1150 PT Time Calculation (min) (ACUTE ONLY): 34 min   Charges:   PT Evaluation $PT Eval Moderate Complexity: 1 Mod PT Treatments $Therapeutic Exercise: 8-22 mins       Kingjames Coury H. Owens Shark, PT, DPT, NCS 12/04/20, 12:14 PM [phone]
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
## 2020-12-05 DIAGNOSIS — R262 Difficulty in walking, not elsewhere classified: Secondary | ICD-10-CM | POA: Diagnosis not present

## 2020-12-05 LAB — CBC WITH DIFFERENTIAL/PLATELET
Abs Immature Granulocytes: 0.01 10*3/uL (ref 0.00–0.07)
Basophils Absolute: 0 10*3/uL (ref 0.0–0.1)
Basophils Relative: 1 %
Eosinophils Absolute: 0.4 10*3/uL (ref 0.0–0.5)
Eosinophils Relative: 8 %
HCT: 38 % (ref 36.0–46.0)
Hemoglobin: 12.1 g/dL (ref 12.0–15.0)
Immature Granulocytes: 0 %
Lymphocytes Relative: 40 %
Lymphs Abs: 2.1 10*3/uL (ref 0.7–4.0)
MCH: 28.4 pg (ref 26.0–34.0)
MCHC: 31.8 g/dL (ref 30.0–36.0)
MCV: 89.2 fL (ref 80.0–100.0)
Monocytes Absolute: 0.7 10*3/uL (ref 0.1–1.0)
Monocytes Relative: 12 %
Neutro Abs: 2 10*3/uL (ref 1.7–7.7)
Neutrophils Relative %: 39 %
Platelets: 326 10*3/uL (ref 150–400)
RBC: 4.26 MIL/uL (ref 3.87–5.11)
RDW: 13.3 % (ref 11.5–15.5)
WBC: 5.3 10*3/uL (ref 4.0–10.5)
nRBC: 0 % (ref 0.0–0.2)

## 2020-12-05 LAB — COMPREHENSIVE METABOLIC PANEL
ALT: 40 U/L (ref 0–44)
AST: 41 U/L (ref 15–41)
Albumin: 3.9 g/dL (ref 3.5–5.0)
Alkaline Phosphatase: 35 U/L — ABNORMAL LOW (ref 38–126)
Anion gap: 6 (ref 5–15)
BUN: 18 mg/dL (ref 8–23)
CO2: 25 mmol/L (ref 22–32)
Calcium: 9.6 mg/dL (ref 8.9–10.3)
Chloride: 109 mmol/L (ref 98–111)
Creatinine, Ser: 0.83 mg/dL (ref 0.44–1.00)
GFR, Estimated: >60 mL/min (ref >60)
Glucose, Bld: 114 mg/dL — ABNORMAL HIGH (ref 70–99)
Potassium: 3.7 mmol/L (ref 3.5–5.1)
Sodium: 140 mmol/L (ref 135–145)
Total Bilirubin: 0.7 mg/dL (ref 0.3–1.2)
Total Protein: 6.4 g/dL — ABNORMAL LOW (ref 6.5–8.1)

## 2020-12-05 LAB — D-DIMER, QUANTITATIVE: D-Dimer, Quant: 0.57 ug/mL-FEU — ABNORMAL HIGH (ref 0.00–0.50)

## 2020-12-05 LAB — C-REACTIVE PROTEIN: CRP: 0.5 mg/dL (ref <1.0)

## 2020-12-05 LAB — FERRITIN: Ferritin: 145 ng/mL (ref 11–307)

## 2020-12-05 MED ORDER — VALACYCLOVIR HCL 1 G PO TABS: 1000.0000 mg | ORAL_TABLET | Freq: Two times a day (BID) | ORAL | 1 refills | Status: DC | PRN

## 2020-12-05 MED ORDER — FLUTICASONE PROPIONATE 50 MCG/ACT NA SUSP: 2.0000 | Freq: Every day | NASAL | Status: AC | PRN

## 2020-12-05 MED ORDER — OMEPRAZOLE 40 MG PO CPDR: 40.0000 mg | DELAYED_RELEASE_CAPSULE | Freq: Every day | ORAL | 1 refills | Status: DC

## 2020-12-05 NOTE — Plan of Care (Signed)
  Problem: Education: Goal: Knowledge of General Education information will improve Description: Including pain rating scale, medication(s)/side effects and non-pharmacologic comfort measures Outcome: Adequate for Discharge   Problem: Health Behavior/Discharge Planning: Goal: Ability to manage health-related needs will improve Outcome: Adequate for Discharge   Problem: Clinical Measurements: Goal: Ability to maintain clinical measurements within normal limits will improve Outcome: Adequate for Discharge Goal: Diagnostic test results will improve Outcome: Adequate for Discharge Goal: Respiratory complications will improve Outcome: Adequate for Discharge Goal: Cardiovascular complication will be avoided Outcome: Adequate for Discharge   Problem: Activity: Goal: Risk for activity intolerance will decrease Outcome: Adequate for Discharge   Problem: Elimination: Goal: Will not experience complications related to bowel motility Outcome: Adequate for Discharge Goal: Will not experience complications related to urinary retention Outcome: Adequate for Discharge   Problem: Pain Managment: Goal: General experience of comfort will improve Outcome: Adequate for Discharge   Problem: Safety: Goal: Ability to remain free from injury will improve Outcome: Adequate for Discharge   Problem: Skin Integrity: Goal: Risk for impaired skin integrity will decrease Outcome: Adequate for Discharge

## 2020-12-05 NOTE — TOC Progression Note (Signed)
Transition of Care [facility]) - Progression Note    Patient Details  Name: [name] MRN: [id number] Date of Birth: 02-Nov-1950  Transition of Care [facility]) CM/SW [place], RN Phone Number: 12/05/2020, 10:50 AM  Clinical Narrative:   The patient is open with [facility] for [facility] services, she has a Corporate investment banker at home. She has no plan for Neurological surgical intervention at this time, She will follow up with Neuro outpatient, The patient has the Home health information, [facility] completed, She stated understanding        Expected Discharge Plan and Services           Expected Discharge Date: 12/05/20                                     Social Determinants of Health (SDOH) Interventions    Readmission Risk Interventions No flowsheet data found.

## 2020-12-05 NOTE — Care Management Obs Status (Signed)
[place] NOTIFICATION   Patient Details  Name: [name] MRN: [id number] Date of Birth: 07-12-1950   Medicare Observation Status Notification Given:  Yes    Conception Oms, RN 12/05/2020, 10:58 AM
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
## 2020-12-05 NOTE — Progress Notes (Signed)
Pt refuses CBGs and states that she is not a diabetic; refuses insulin.

## 2020-12-09 ENCOUNTER — Telehealth: Payer: Self-pay

## 2020-12-09 NOTE — Telephone Encounter (Signed)
Transition Care Management Follow-up Telephone Call Date of discharge and from where: 12/05/2020-[facility] How have you been since you were released from the hospital? Doing ok-getting better everyday Any questions or concerns? No  Items Reviewed: Did the pt receive and understand the discharge instructions provided? Yes  Medications obtained and verified? Yes  Other? Yes  Any new allergies since your discharge? No  Dietary orders reviewed? Yes Do you have support at home? No not all the time but someone comes by once daily  Home Care and Equipment/Supplies: Were home health services ordered? no If so, what is the name of the agency? N/a  Has the agency set up a time to come to the patient's home? not applicable Were any new equipment or medical supplies ordered?  No What is the name of the medical supply agency? N/a Were you able to get the supplies/equipment? not applicable Do you have any questions related to the use of the equipment or supplies? N/a  Functional Questionnaire: (I = Independent and D = Dependent) ADLs: I  Bathing/Dressing- I  Meal Prep- I  Eating- I  Maintaining continence- I  Transferring/Ambulation- I with walker  Managing Meds- I  Follow up appointments reviewed:  PCP Hospital f/u appt confirmed? Yes  Scheduled to see [name] on 12/16/2020 @ 11:20. [facility] f/u appt confirmed? Yes  Scheduled to see [facility]-Patient unsure of exact date-pleans to call to verify. Are transportation arrangements needed? No  If their condition worsens, is the pt aware to call PCP or go to the Emergency Dept.? Yes Was the patient provided with contact information for the PCP's office or ED? Yes Was to pt encouraged to call back with questions or concerns? Yes

## 2020-12-09 NOTE — Telephone Encounter (Signed)
Dawn 

## 2020-12-11 ENCOUNTER — Telehealth: Payer: Self-pay | Admitting: Family Medicine

## 2020-12-11 DIAGNOSIS — I10 Essential (primary) hypertension: Secondary | ICD-10-CM

## 2020-12-11 DIAGNOSIS — E782 Mixed hyperlipidemia: Secondary | ICD-10-CM | POA: Diagnosis not present

## 2020-12-11 DIAGNOSIS — F321 Major depressive disorder, single episode, moderate: Secondary | ICD-10-CM

## 2020-12-11 NOTE — Telephone Encounter (Signed)
Ashter ot from [facility] called to give verbal orders - one time a week for two weeks. Tele: [phone], okay to lvm. Please advise.

## 2020-12-11 NOTE — Telephone Encounter (Signed)
Verbal given 

## 2020-12-13 ENCOUNTER — Encounter: Payer: Medicare HMO | Admitting: Family Medicine

## 2020-12-16 ENCOUNTER — Encounter: Payer: Self-pay | Admitting: Family Medicine

## 2020-12-16 ENCOUNTER — Telehealth (INDEPENDENT_AMBULATORY_CARE_PROVIDER_SITE_OTHER): Payer: Medicare HMO | Admitting: Family Medicine

## 2020-12-16 VITALS — BP 135/85

## 2020-12-16 DIAGNOSIS — M549 Dorsalgia, unspecified: Secondary | ICD-10-CM | POA: Diagnosis not present

## 2020-12-16 DIAGNOSIS — N760 Acute vaginitis: Secondary | ICD-10-CM

## 2020-12-16 DIAGNOSIS — M5430 Sciatica, unspecified side: Secondary | ICD-10-CM

## 2020-12-16 MED ORDER — FLUCONAZOLE 150 MG PO TABS: ORAL_TABLET | ORAL | 0 refills | Status: DC
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
## 2020-12-16 NOTE — Assessment & Plan Note (Signed)
F/u neurosurgeon   

## 2020-12-16 NOTE — Assessment & Plan Note (Signed)
Diflucan per orders

## 2020-12-31 ENCOUNTER — Ambulatory Visit (INDEPENDENT_AMBULATORY_CARE_PROVIDER_SITE_OTHER): Payer: Medicare HMO | Admitting: Family Medicine

## 2020-12-31 ENCOUNTER — Other Ambulatory Visit (HOSPITAL_COMMUNITY)
Admission: RE | Admit: 2020-12-31 | Discharge: 2020-12-31 | Disposition: A | Discharge status: Home / Self Care | Payer: Medicare HMO | Source: Ambulatory Visit | Attending: Family Medicine | Admitting: Family Medicine

## 2020-12-31 ENCOUNTER — Encounter: Payer: Self-pay | Admitting: Family Medicine

## 2020-12-31 VITALS — BP 120/80 | HR 81 | Temp 98.5°F | Resp 18 | Ht <= 58 in | Wt 154.6 lb

## 2020-12-31 DIAGNOSIS — N760 Acute vaginitis: Secondary | ICD-10-CM | POA: Diagnosis not present

## 2020-12-31 DIAGNOSIS — M48061 Spinal stenosis, lumbar region without neurogenic claudication: Secondary | ICD-10-CM | POA: Diagnosis not present

## 2020-12-31 DIAGNOSIS — E119 Type 2 diabetes mellitus without complications: Secondary | ICD-10-CM | POA: Diagnosis not present

## 2020-12-31 DIAGNOSIS — K589 Irritable bowel syndrome without diarrhea: Secondary | ICD-10-CM

## 2020-12-31 DIAGNOSIS — E1169 Type 2 diabetes mellitus with other specified complication: Secondary | ICD-10-CM

## 2020-12-31 DIAGNOSIS — Z0000 Encounter for general adult medical examination without abnormal findings: Secondary | ICD-10-CM | POA: Diagnosis not present

## 2020-12-31 DIAGNOSIS — E785 Hyperlipidemia, unspecified: Secondary | ICD-10-CM

## 2020-12-31 MED ORDER — HYOSCYAMINE SULFATE 0.125 MG SL SUBL: 0.1250 mg | SUBLINGUAL_TABLET | SUBLINGUAL | 1 refills | Status: AC | PRN

## 2020-12-31 MED ORDER — FENOFIBRATE 160 MG PO TABS: 160.0000 mg | ORAL_TABLET | Freq: Every day | ORAL | 1 refills | Status: DC
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
## 2020-12-31 NOTE — Patient Instructions (Signed)
Preventive Care 65 Years and Older, Female °Preventive care refers to lifestyle choices and visits with your health care provider that can promote health and wellness. Preventive care visits are also called wellness exams. °What can I expect for my preventive care visit? °Counseling °Your health care provider may ask you questions about your: °Medical history, including: °Past medical problems. °Family medical history. °Pregnancy and menstrual history. °History of falls. °Current health, including: °Memory and ability to understand (cognition). °Emotional well-being. °Home life and relationship well-being. °Sexual activity and sexual health. °Lifestyle, including: °Alcohol, nicotine or tobacco, and drug use. °Access to firearms. °Diet, exercise, and sleep habits. °Work and work environment. °Sunscreen use. °Safety issues such as seatbelt and bike helmet use. °Physical exam °Your health care provider will check your: °Height and weight. These may be used to calculate your BMI (body mass index). BMI is a measurement that tells if you are at a healthy weight. °Waist circumference. This measures the distance around your waistline. This measurement also tells if you are at a healthy weight and may help predict your risk of certain diseases, such as type 2 diabetes and high blood pressure. °Heart rate and blood pressure. °Body temperature. °Skin for abnormal spots. °What immunizations do I need? °Vaccines are usually given at various ages, according to a schedule. Your health care provider will recommend vaccines for you based on your age, medical history, and lifestyle or other factors, such as travel or where you work. °What tests do I need? °Screening °Your health care provider may recommend screening tests for certain conditions. This may include: °Lipid and cholesterol levels. °Hepatitis C test. °Hepatitis B test. °HIV (human immunodeficiency virus) test. °STI (sexually transmitted infection) testing, if you are at  risk. °Lung cancer screening. °Colorectal cancer screening. °Diabetes screening. This is done by checking your blood sugar (glucose) after you have not eaten for a while (fasting). °Mammogram. Talk with your health care provider about how often you should have regular mammograms. °BRCA-related cancer screening. This may be done if you have a family history of breast, ovarian, tubal, or peritoneal cancers. °Bone density scan. This is done to screen for osteoporosis. °Talk with your health care provider about your test results, treatment options, and if necessary, the need for more tests. °Follow these instructions at home: °Eating and drinking ° °Eat a diet that includes fresh fruits and vegetables, whole grains, lean protein, and low-fat dairy products. Limit your intake of foods with high amounts of sugar, saturated fats, and salt. °Take vitamin and mineral supplements as recommended by your health care provider. °Do not drink alcohol if your health care provider tells you not to drink. °If you drink alcohol: °Limit how much you have to 0-1 drink a day. °Know how much alcohol is in your drink. In the [place] one drink equals one 12 oz bottle of beer (355 mL), one 5 oz glass of wine (148 mL), or one 1½ oz glass of hard liquor (44 mL). °Lifestyle °Brush your teeth every morning and night with fluoride toothpaste. Floss one time each day. °Exercise for at least 30 minutes 5 or more days each week. °Do not use any products that contain nicotine or tobacco. These products include cigarettes, chewing tobacco, and vaping devices, such as e-cigarettes. If you need help quitting, ask your health care provider. °Do not use drugs. °If you are sexually active, practice safe sex. Use a condom or other form of protection in order to prevent STIs. °Take aspirin only as told by your   health care provider. Make sure that you understand how much to take and what form to take. Work with your health care provider to find out whether it  is safe and beneficial for you to take aspirin daily. Ask your health care provider if you need to take a cholesterol-lowering medicine (statin). Find healthy ways to manage stress, such as: Meditation, yoga, or listening to music. Journaling. Talking to a trusted person. Spending time with friends and family. Minimize exposure to UV radiation to reduce your risk of skin cancer. Safety Always wear your seat belt while driving or riding in a vehicle. Do not drive: If you have been drinking alcohol. Do not ride with someone who has been drinking. When you are tired or distracted. While texting. If you have been using any mind-altering substances or drugs. Wear a helmet and other protective equipment during sports activities. If you have firearms in your house, make sure you follow all gun safety procedures. What's next? Visit your health care provider once a year for an annual wellness visit. Ask your health care provider how often you should have your eyes and teeth checked. Stay up to date on all vaccines. This information is not intended to replace advice given to you by your health care provider. Make sure you discuss any questions you have with your health care provider. Document Revised: 06/26/2020 Document Reviewed: 06/26/2020 Elsevier Patient Education  [place].

## 2021-01-01 DIAGNOSIS — K589 Irritable bowel syndrome without diarrhea: Secondary | ICD-10-CM | POA: Insufficient documentation

## 2021-01-01 DIAGNOSIS — E119 Type 2 diabetes mellitus without complications: Secondary | ICD-10-CM | POA: Insufficient documentation

## 2021-01-01 DIAGNOSIS — K581 Irritable bowel syndrome with constipation: Secondary | ICD-10-CM | POA: Insufficient documentation

## 2021-01-01 DIAGNOSIS — M48061 Spinal stenosis, lumbar region without neurogenic claudication: Secondary | ICD-10-CM | POA: Insufficient documentation

## 2021-01-01 LAB — LIPID PANEL
Cholesterol: 237 mg/dL — ABNORMAL HIGH (ref 0–200)
HDL: 51.9 mg/dL (ref >39.00)
NonHDL: 185.19
Total CHOL/HDL Ratio: 5
Triglycerides: 264 mg/dL — ABNORMAL HIGH (ref 0.0–149.0)
VLDL: 52.8 mg/dL — ABNORMAL HIGH (ref 0.0–40.0)

## 2021-01-01 LAB — COMPREHENSIVE METABOLIC PANEL
ALT: 41 U/L — ABNORMAL HIGH (ref 0–35)
AST: 39 U/L — ABNORMAL HIGH (ref 0–37)
Albumin: 4.1 g/dL (ref 3.5–5.2)
Alkaline Phosphatase: 55 U/L (ref 39–117)
BUN: 16 mg/dL (ref 6–23)
CO2: 28 mEq/L (ref 19–32)
Calcium: 9.8 mg/dL (ref 8.4–10.5)
Chloride: 105 mEq/L (ref 96–112)
Creatinine, Ser: 0.76 mg/dL (ref 0.40–1.20)
GFR: 79.09 mL/min (ref >60.00)
Glucose, Bld: 89 mg/dL (ref 70–99)
Potassium: 4 mEq/L (ref 3.5–5.1)
Sodium: 139 mEq/L (ref 135–145)
Total Bilirubin: 0.4 mg/dL (ref 0.2–1.2)
Total Protein: 6.4 g/dL (ref 6.0–8.3)

## 2021-01-01 LAB — CBC WITH DIFFERENTIAL/PLATELET
Basophils Absolute: 0.1 10*3/uL (ref 0.0–0.1)
Basophils Relative: 0.7 % (ref 0.0–3.0)
Eosinophils Absolute: 0.5 10*3/uL (ref 0.0–0.7)
Eosinophils Relative: 5.8 % — ABNORMAL HIGH (ref 0.0–5.0)
HCT: 39.5 % (ref 36.0–46.0)
Hemoglobin: 12.9 g/dL (ref 12.0–15.0)
Lymphocytes Relative: 38 % (ref 12.0–46.0)
Lymphs Abs: 3.1 10*3/uL (ref 0.7–4.0)
MCHC: 32.6 g/dL (ref 30.0–36.0)
MCV: 86.9 fl (ref 78.0–100.0)
Monocytes Absolute: 0.7 10*3/uL (ref 0.1–1.0)
Monocytes Relative: 8.8 % (ref 3.0–12.0)
Neutro Abs: 3.8 10*3/uL (ref 1.4–7.7)
Neutrophils Relative %: 46.7 % (ref 43.0–77.0)
Platelets: 305 10*3/uL (ref 150.0–400.0)
RBC: 4.55 Mil/uL (ref 3.87–5.11)
RDW: 13.1 % (ref 11.5–15.5)
WBC: 8.1 10*3/uL (ref 4.0–10.5)

## 2021-01-01 LAB — HEMOGLOBIN A1C: Hgb A1c MFr Bld: 5.7 % (ref 4.6–6.5)

## 2021-01-01 LAB — MICROALBUMIN / CREATININE URINE RATIO
Creatinine,U: 100.9 mg/dL
Microalb Creat Ratio: 0.9 mg/g (ref 0.0–30.0)
Microalb, Ur: 0.9 mg/dL (ref 0.0–1.9)

## 2021-01-01 LAB — LDL CHOLESTEROL, DIRECT: Direct LDL: 145 mg/dL

## 2021-01-01 NOTE — Assessment & Plan Note (Signed)
Encourage heart healthy diet such as MIND or DASH diet, increase exercise, avoid trans fats, simple carbohydrates and processed foods, consider a krill or fish or flaxseed oil cap daily.  °

## 2021-01-01 NOTE — Assessment & Plan Note (Signed)
Per Chief of Staff and PT

## 2021-01-01 NOTE — Assessment & Plan Note (Signed)
ghm utd Check labs  See avs  

## 2021-01-01 NOTE — Assessment & Plan Note (Signed)
con't diet  Lab Results  Component Value Date   HGBA1C 5.7 12/31/2020

## 2021-01-02 LAB — CERVICOVAGINAL ANCILLARY ONLY
Bacterial Vaginitis (gardnerella): POSITIVE — AB
Candida Glabrata: NEGATIVE
Candida Vaginitis: NEGATIVE
Chlamydia: NEGATIVE
Comment: NEGATIVE
Comment: NEGATIVE
Comment: NEGATIVE
Comment: NEGATIVE
Comment: NEGATIVE
Comment: NORMAL
Neisseria Gonorrhea: NEGATIVE
Trichomonas: NEGATIVE

## 2021-01-03 ENCOUNTER — Other Ambulatory Visit: Payer: Self-pay

## 2021-01-03 MED ORDER — METRONIDAZOLE 500 MG PO TABS: 500.0000 mg | ORAL_TABLET | Freq: Two times a day (BID) | ORAL | 0 refills | Status: DC

## 2021-01-08 ENCOUNTER — Ambulatory Visit: Payer: Medicare HMO | Attending: Family Medicine | Admitting: Physical Therapy

## 2021-01-08 DIAGNOSIS — G8929 Other chronic pain: Secondary | ICD-10-CM | POA: Insufficient documentation

## 2021-01-08 DIAGNOSIS — M545 Low back pain, unspecified: Secondary | ICD-10-CM | POA: Diagnosis not present

## 2021-01-08 DIAGNOSIS — M48061 Spinal stenosis, lumbar region without neurogenic claudication: Secondary | ICD-10-CM | POA: Insufficient documentation

## 2021-01-08 DIAGNOSIS — R262 Difficulty in walking, not elsewhere classified: Secondary | ICD-10-CM | POA: Diagnosis not present
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
## 2021-01-15 ENCOUNTER — Ambulatory Visit: Payer: Medicare HMO | Attending: Family Medicine | Admitting: Physical Therapy

## 2021-01-15 DIAGNOSIS — G8929 Other chronic pain: Secondary | ICD-10-CM | POA: Insufficient documentation

## 2021-01-15 DIAGNOSIS — M545 Low back pain, unspecified: Secondary | ICD-10-CM | POA: Diagnosis not present

## 2021-01-15 DIAGNOSIS — R262 Difficulty in walking, not elsewhere classified: Secondary | ICD-10-CM | POA: Insufficient documentation
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
## 2021-01-15 NOTE — Progress Notes (Signed)
° ° °  MyChart Video Visit    Virtual Visit via Video Note   This visit type was conducted due to national recommendations for restrictions regarding the COVID-19 Pandemic (e.g. social distancing) in an effort to limit this patient's exposure and mitigate transmission in our community. This patient is at least at moderate risk for complications without adequate follow up. This format is felt to be most appropriate for this patient at this time. Physical exam was limited by quality of the video and audio technology used for the visit. [name] was able to get the patient set up on a video visit.  Patient location: home alone  Patient and provider in visit Provider location: Office  I discussed the limitations of evaluation and management by telemedicine and the availability of in person appointments. The patient expressed understanding and agreed to proceed.  Visit Date: 12/16/2020  Today's healthcare provider: [name], DO     Subjective:    Patient ID: [name], female    DOB: June 15, 1950, 71 y.o.   MRN: [id number]  Chief Complaint  Patient presents with   Hospitalization Follow-up                      I am having [name] start on fluconazole. I am also having her maintain her multivitamin with minerals, Lyrica, Ventolin HFA, cholecalciferol, alendronate, cyclobenzaprine, zolpidem, Linzess, traMADol, escitalopram, celecoxib, ARIPiprazole, rOPINIRole, fluticasone, HYDROcodone-acetaminophen, valACYclovir, omeprazole, and fluticasone.  Meds ordered this encounter  Medications   fluconazole (DIFLUCAN) 150 MG tablet    Sig: 1 po x1, may repeat in 3 days prn    Dispense:  2 tablet    Refill:  0    I discussed the assessment and treatment plan with the patient. The patient was provided an opportunity to ask questions and all were answered. The patient agreed with the plan and demonstrated an understanding of the instructions.    The patient was advised to call back or seek an in-person evaluation if the symptoms worsen or if the condition fails to improve as anticipated.     [name], DO [place] [facility] [employer] [facility] [phone] (phone) [phone] (fax)  [place]

## 2021-01-20 ENCOUNTER — Ambulatory Visit: Payer: Medicare HMO | Admitting: Physical Therapy

## 2021-01-20 ENCOUNTER — Encounter: Payer: Self-pay | Admitting: Physical Therapy

## 2021-01-20 DIAGNOSIS — G8929 Other chronic pain: Secondary | ICD-10-CM

## 2021-01-20 DIAGNOSIS — M545 Low back pain, unspecified: Secondary | ICD-10-CM

## 2021-01-20 DIAGNOSIS — R262 Difficulty in walking, not elsewhere classified: Secondary | ICD-10-CM | POA: Diagnosis not present
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
## 2021-01-22 ENCOUNTER — Encounter (INDEPENDENT_AMBULATORY_CARE_PROVIDER_SITE_OTHER): Payer: Self-pay | Admitting: Family Medicine

## 2021-01-22 ENCOUNTER — Ambulatory Visit (INDEPENDENT_AMBULATORY_CARE_PROVIDER_SITE_OTHER): Payer: Medicare HMO | Admitting: Family Medicine

## 2021-01-22 ENCOUNTER — Other Ambulatory Visit: Payer: Self-pay

## 2021-01-22 ENCOUNTER — Encounter: Payer: Medicare HMO | Admitting: Physical Therapy

## 2021-01-22 VITALS — BP 127/75 | HR 89 | Temp 98.1°F | Ht <= 58 in | Wt 150.0 lb

## 2021-01-22 DIAGNOSIS — R7303 Prediabetes: Secondary | ICD-10-CM | POA: Diagnosis not present

## 2021-01-22 DIAGNOSIS — Z6834 Body mass index (BMI) 34.0-34.9, adult: Secondary | ICD-10-CM

## 2021-01-22 DIAGNOSIS — E669 Obesity, unspecified: Secondary | ICD-10-CM

## 2021-01-22 DIAGNOSIS — M5431 Sciatica, right side: Secondary | ICD-10-CM

## 2021-01-22 DIAGNOSIS — M5432 Sciatica, left side: Secondary | ICD-10-CM

## 2021-01-22 MED ORDER — OZEMPIC (1 MG/DOSE) 4 MG/3ML ~~LOC~~ SOPN: 1.0000 mg | PEN_INJECTOR | SUBCUTANEOUS | 0 refills | Status: DC

## 2021-01-22 NOTE — Progress Notes (Signed)
Chief Complaint:   OBESITY [name] is here to discuss her progress with her obesity treatment plan along with follow-up of her obesity related diagnoses. [name] is on keeping a food journal and adhering to recommended goals of 1000-1100 calories and 75 grams of protein and practicing portion control and making smarter food choices, such as increasing vegetables and decreasing simple carbohydrates and states she is following her eating plan approximately 0% of the time. [name] states she is doing physical therapy 45 minutes 1 times per week.  Today's visit was #: 12 Starting weight: 148 lbs Starting date: 01/23/2020 Today's weight: 150 lbs Today's date:01/22/2021 Total lbs lost to date: 0 Total lbs lost since last in-office visit: 0  Interim History: [name] has not been into the office since August 2022 for a visit. She is up 9 lbs today.She had been off of Ozempic for a while but started back 3 weeks ago. She has titrated up to 0.5 mg. She feels it helps tremendously with her appetite. Her neighbor gave her the Ozempic. She is having 3 meals per day. She doesn't always have protein at all meals.   Subjective:   1. Pre-diabetes [name] is on Ozempic 0.5 mg weekly. She has constipation and takes Linzess.  Lab Results  Component Value Date   HGBA1C 5.7 12/31/2020   Lab Results  Component Value Date   INSULIN 17.8 06/12/2020   INSULIN 7.3 01/23/2020    2. Bilateral sciatica [name] was hospitalized in October and then again in November for inability to walk due to back issues. She is currently doing physical therapy and ambulating well.   Assessment/Plan:   1. Pre-diabetes We will refill Ozempic 1 mg weekly. She will take 0.5 mg (39 clicks) weekly.    - Semaglutide, 1 MG/DOSE, (OZEMPIC, 1 MG/DOSE,) 4 MG/3ML SOPN; Inject 1 mg into the skin once a week.  Dispense: 3 mL; Refill: 0  2. Bilateral sciatica [name] will follow up with physical therapy and neurosurgeon.   3.  Obesity: Current BMI 34.86 [name] is currently in the action stage of change. As such, her goal is to continue with weight loss efforts. She has agreed to practicing portion control and making smarter food choices, such as increasing vegetables and decreasing simple carbohydrates.   Exercise goals:  [name] will continue physical therapy.   Behavioral modification strategies: increasing lean protein intake and decreasing simple carbohydrates.  [name] has agreed to follow-up with our clinic in 4 weeks.  Objective:   Blood pressure 127/75, pulse 89, temperature 98.1 F (36.7 C), height 4\' 7"  (1.397 m), weight 150 lb (68 kg), SpO2 98 %. Body mass index is 34.86 kg/m.  General: Cooperative, alert, well developed, in no acute distress. HEENT: Conjunctivae and lids unremarkable. Cardiovascular: Regular rhythm.  Lungs: Normal work of breathing. Neurologic: No focal deficits.   Lab Results  Component Value Date   CREATININE 0.76 12/31/2020   BUN 16 12/31/2020   NA 139 12/31/2020   K 4.0 12/31/2020   CL 105 12/31/2020   CO2 28 12/31/2020   Lab Results  Component Value Date   ALT 41 (H) 12/31/2020   AST 39 (H) 12/31/2020   ALKPHOS 55 12/31/2020   BILITOT 0.4 12/31/2020   Lab Results  Component Value Date   HGBA1C 5.7 12/31/2020   HGBA1C 5.4 12/04/2020   HGBA1C 5.7 (H) 06/12/2020   HGBA1C 5.9 (H) 01/23/2020   HGBA1C 5.8 04/10/2014   Lab Results  Component Value Date   INSULIN  17.8 06/12/2020   INSULIN 7.3 01/23/2020   Lab Results  Component Value Date   TSH 2.11 09/30/2018   Lab Results  Component Value Date   CHOL 237 (H) 12/31/2020   HDL 51.90 12/31/2020   LDLCALC 77 01/23/2020   LDLDIRECT 145.0 12/31/2020   TRIG 264.0 (H) 12/31/2020   CHOLHDL 5 12/31/2020   Lab Results  Component Value Date   VD25OH 67.2 06/12/2020   VD25OH 26.6 (L) 01/23/2020   VD25OH 55 09/22/2010   Lab Results  Component Value Date   WBC 8.1 12/31/2020   HGB 12.9 12/31/2020   HCT  39.5 12/31/2020   MCV 86.9 12/31/2020   PLT 305.0 12/31/2020   Lab Results  Component Value Date   FERRITIN 145 12/05/2020   Attestation Statements:   Reviewed by clinician on day of visit: allergies, medications, problem list, medical history, surgical history, family history, social history, and previous encounter notes.  I, Lizbeth Bark, RMA, am acting as Location manager for [employer], [place].  I have reviewed the above documentation for accuracy and completeness, and I agree with the above. -  [name], FNP

## 2021-01-23 ENCOUNTER — Encounter (INDEPENDENT_AMBULATORY_CARE_PROVIDER_SITE_OTHER): Payer: Self-pay | Admitting: Family Medicine

## 2021-01-27 ENCOUNTER — Ambulatory Visit: Payer: Medicare HMO | Admitting: Physical Therapy

## 2021-01-28 ENCOUNTER — Ambulatory Visit (INDEPENDENT_AMBULATORY_CARE_PROVIDER_SITE_OTHER): Payer: Medicare HMO | Admitting: Pharmacist

## 2021-01-28 ENCOUNTER — Other Ambulatory Visit: Payer: Self-pay | Admitting: Pharmacist

## 2021-01-28 DIAGNOSIS — R7303 Prediabetes: Secondary | ICD-10-CM

## 2021-01-28 DIAGNOSIS — E782 Mixed hyperlipidemia: Secondary | ICD-10-CM

## 2021-01-28 DIAGNOSIS — I10 Essential (primary) hypertension: Secondary | ICD-10-CM

## 2021-01-28 DIAGNOSIS — E785 Hyperlipidemia, unspecified: Secondary | ICD-10-CM

## 2021-01-28 DIAGNOSIS — S134XXA Sprain of ligaments of cervical spine, initial encounter: Secondary | ICD-10-CM

## 2021-01-28 DIAGNOSIS — E669 Obesity, unspecified: Secondary | ICD-10-CM
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
## 2021-01-28 NOTE — Patient Instructions (Signed)
[name] It was a pleasure speaking with you today.  I have attached a summary of our visit today and information about your health goals.   Our next appointment is by telephone on February 27, 2021 at 11 am  Please call the care guide team at [phone] if you need to cancel or reschedule your appointment.    If you have any questions or concerns, please feel free to contact me either at the phone number below or with a MyChart message.     [name], PharmD Clinical Pharmacist [facility] [phone] (direct line)  [phone] (main office number)   CARE PLAN ENTRY   Current Barriers:  Chronic Disease Management support, education, and care coordination needs related to Asthma, Pre-Diabetes, Hypertension, Hyperlipidemia, RLS/Neuropathy, Pain, Insomnia, Depression, GERD, Osteopenia, Allergic Rhrinitis    Hypertension BP Readings from Last 3 Encounters:  01/22/21 127/75  12/31/20 120/80  12/16/20 135/85   Pharmacist Clinical Goal(s): Over the next 90 days, patient will work with PharmD and providers to maintain BP goal <140/90 Current regimen:  none Patient self care activities - Over the next 90 days, patient will: Ensure daily salt intake < 2300 mg/day Continue to check blood pressure at least weekly and record.  Encouraged exercise program / walking with goal to increase as able to 150 minutes per week (this goal on hold until workup for back and leg pain and weakness is completed / resolved)    Hyperlipidemia  Lab Results  Component Value Date   TRIG 264.0 (H) 12/31/2020   TRIG 72 01/23/2020   TRIG 97.0 12/22/2019   Lab Results  Component Value Date   LDLDIRECT 145.0 12/31/2020   LDLDIRECT 131.0 10/01/2016   LDLDIRECT 141.0 04/10/2014    Pharmacist Clinical Goal(s): Over the next 90 days, patient will work with PharmD and providers to achieve LDL goal < 100 Current regimen:  Fenofibrate 160mg  daily Patient  self care activities - Over the next 90 days, patient will: Maintain cholesterol medication regimen.  Continue with exercise - goal is 150 minutes of moderate intensity exercise per week  Pre-Diabetes with neuropathy Lab Results  Component Value Date/Time   HGBA1C 5.7 12/31/2020 04:43 PM   HGBA1C 5.4 12/04/2020 04:11 AM   Pharmacist Clinical Goal(s): Over the next 90 days, patient will work with PharmD and providers to maintain A1c goal <6.5% Current regimen:  Pregabalin 100mg  - take up to 3 times a day  Ozempic 0.5mg  subcutaneously once per week Patient self care activities - Over the next 90 days, patient will: Limit carbohydrate intake (30-45 grams per meal) Reviewed A1c and blood sugar goals; Encouraged exercise program / walking with goal to increase as able to 150 minutes per week (this goal on hold until workup for back and leg pain and weakness is completed / resolved)   Depression Pharmacist Clinical Goal(s): Over the next 90 days, patient will work with PharmD and providers to reduce symptoms of depression/anxiety Current regimen:  Abilify 5mg  daily (take prior to sleep) Escitalopram 20mg  daily (take during waking hours) Zoplidem 10mg  - take 0.5 tablet = 5mg  at night, if awakes at night will take other half tablet to equal total dose of 10mg  Patient self care activities - Over the next 90 days, patient will: Continue current regimen.  Consider grief counseling   Osteopenia:  Pharmacist Clinical Goal(s) Prevent falls and fracture Current regimen:  Alendronate 70mg  - take 1 tablet weekly  Vitamin D 2000 IU daily  Patient  self care activities - Over the next 90 days, patient will: Continue current therapy Consider rechecking DEXA in 2023   Medication management Pharmacist Clinical Goal(s): Over the next 90 days, patient will work with PharmD and providers to maintain optimal medication adherence Current pharmacy: [facility] Comprehensive medication review performed. Continue current medication management strategy Reviewed patient's current prescription formulary. Hyoscyamine is not included in her formulary. I did checked GoodRx and she could get for about $10 per 30 tablets by using GoodRx card.  Patient self care activities - Over the next 90 days, patient will: Focus on medication adherence by filling medications appropriately  Take medications as prescribed Report any questions or concerns to PharmD and/or provider(s) Complete patient assistance program applications for Ozempic and Linzess and return to clinical pharmacist at [facility].   Patient verbalizes understanding of instructions and care plan provided today and agrees to view in [place]. Active MyChart status confirmed with patient.

## 2021-01-28 NOTE — Telephone Encounter (Signed)
Patient requested refill for cyclobenzaprine during phone Chronic Care Management visit.  Forwarding to PCP team to review and refill per protocol Last filled cyclobenzaprine for #180 - 90 days supply  Last fill date: 08/20/2020 [facility]acy: [facility]

## 2021-01-29 ENCOUNTER — Encounter: Payer: Medicare HMO | Admitting: Physical Therapy

## 2021-01-30 MED ORDER — CYCLOBENZAPRINE HCL 10 MG PO TABS: ORAL_TABLET | ORAL | 0 refills | Status: DC

## 2021-02-03 ENCOUNTER — Ambulatory Visit: Payer: Medicare HMO | Admitting: Physical Therapy

## 2021-02-05 ENCOUNTER — Encounter: Payer: Medicare HMO | Admitting: Physical Therapy

## 2021-02-10 ENCOUNTER — Ambulatory Visit: Payer: Medicare HMO | Admitting: Physical Therapy

## 2021-02-11 DIAGNOSIS — E785 Hyperlipidemia, unspecified: Secondary | ICD-10-CM | POA: Diagnosis not present

## 2021-02-11 DIAGNOSIS — I10 Essential (primary) hypertension: Secondary | ICD-10-CM

## 2021-02-11 DIAGNOSIS — E782 Mixed hyperlipidemia: Secondary | ICD-10-CM | POA: Diagnosis not present

## 2021-02-12 ENCOUNTER — Encounter: Payer: Medicare HMO | Admitting: Physical Therapy

## 2021-02-16 ENCOUNTER — Encounter (INDEPENDENT_AMBULATORY_CARE_PROVIDER_SITE_OTHER): Payer: Self-pay | Admitting: Family Medicine

## 2021-02-16 DIAGNOSIS — Z20822 Contact with and (suspected) exposure to covid-19: Secondary | ICD-10-CM | POA: Diagnosis not present

## 2021-02-17 ENCOUNTER — Encounter (INDEPENDENT_AMBULATORY_CARE_PROVIDER_SITE_OTHER): Payer: Self-pay | Admitting: Family Medicine

## 2021-02-17 ENCOUNTER — Telehealth (INDEPENDENT_AMBULATORY_CARE_PROVIDER_SITE_OTHER): Payer: Medicare HMO | Admitting: Family Medicine

## 2021-02-17 ENCOUNTER — Encounter: Payer: Medicare HMO | Admitting: Physical Therapy

## 2021-02-17 ENCOUNTER — Other Ambulatory Visit: Payer: Self-pay

## 2021-02-17 DIAGNOSIS — M5431 Sciatica, right side: Secondary | ICD-10-CM | POA: Diagnosis not present

## 2021-02-17 DIAGNOSIS — E669 Obesity, unspecified: Secondary | ICD-10-CM

## 2021-02-17 DIAGNOSIS — Z6834 Body mass index (BMI) 34.0-34.9, adult: Secondary | ICD-10-CM

## 2021-02-17 DIAGNOSIS — M5432 Sciatica, left side: Secondary | ICD-10-CM

## 2021-02-17 DIAGNOSIS — R7303 Prediabetes: Secondary | ICD-10-CM

## 2021-02-17 NOTE — Progress Notes (Signed)
TeleHealth Visit:  Due to the COVID-19 pandemic, this visit was completed with telemedicine (audio/video) technology to reduce patient and provider exposure as well as to preserve personal protective equipment.   Brinkley has verbally consented to this TeleHealth visit. The patient is located at home, the provider is located at the [facility] office. The participants in this visit include the listed provider and patient. The visit was conducted today via video.  Chief Complaint: OBESITY Aubryana is here to discuss her progress with her obesity treatment plan along with follow-up of her obesity related diagnoses. Joani is on practicing portion control and making smarter food choices, such as increasing vegetables and decreasing simple carbohydrates and states she is following her eating plan approximately 75% of the time. Mistie states she is doing gym exercise for 60 minutes 2 times per week.  Today's visit was #: 13 Starting weight: 148 lbs Starting date: 01/23/2020  Interim History: Pammie reports weight of 151 lbs at home today. She feels she has lost 2-3 lbs. her last weight in the office was 150 pounds.  Se has started going to the gym recently. She tracks calories and they are less than 1100 per day. She is not tracking protein but focuses on protein intake.  Subjective:   1. Pre-diabetes Marne's last A1C elevated at 5.7. She is on Ozempic 0.5 mg weekly (prescribed 1 mg pens so that she can get more doses out of it). (39 clicks). She notes constipation. She is on Linzess and also takes Miralax but it is still not well managed.  Lab Results  Component Value Date   HGBA1C 5.7 12/31/2020   Lab Results  Component Value Date   INSULIN 17.8 06/12/2020   INSULIN 7.3 01/23/2020    2. Bilateral sciatica Denielle notes pain is better than it has been in 20 years. She did a few weeks of physical therapy. At one point a few months ago she could barely walk.  She is  now feeling well enough to exercise and has been going to the gym.  She has a neurosurgeon appointment in 2 weeks.   Assessment/Plan:   1. Pre-diabetes Axel will continue Ozempic at 0.5 mg weekly. She will continue to work on weight loss, exercise, and decreasing simple carbohydrates to help decrease the risk of diabetes.   2. Bilateral sciatica Sophiana will follow up with neurosurgeon in 2 weeks.  3. Obesity: Current BMI 34.86 Jodiann is currently in the action stage of change. As such, her goal is to continue with weight loss efforts. She has agreed to keeping a food journal and adhering to recommended goals of 1000-1100 calories and 80 grams of protein.   I suggested she use My Fitness Pal or Lose It! to track protein as well as calories.   Exercise goals:  As is.  Behavioral modification strategies: increasing lean protein intake, better snacking choices, and keeping a strict food journal.  Vanetta has agreed to follow-up with our clinic in 3-4 weeks with Linus Orn. Objective:   VITALS: Per patient if applicable, see vitals. GENERAL: Alert and in no acute distress. CARDIOPULMONARY: No increased WOB. Speaking in clear sentences.  PSYCH: Pleasant and cooperative. Speech normal rate and rhythm. Affect is appropriate. Insight and judgement are appropriate. Attention is focused, linear, and appropriate.  NEURO: Oriented as arrived to appointment on time with no prompting.   Lab Results  Component Value Date   CREATININE 0.76 12/31/2020   BUN 16 12/31/2020   NA 139  12/31/2020   K 4.0 12/31/2020   CL 105 12/31/2020   CO2 28 12/31/2020   Lab Results  Component Value Date   ALT 41 (H) 12/31/2020   AST 39 (H) 12/31/2020   ALKPHOS 55 12/31/2020   BILITOT 0.4 12/31/2020   Lab Results  Component Value Date   HGBA1C 5.7 12/31/2020   HGBA1C 5.4 12/04/2020   HGBA1C 5.7 (H) 06/12/2020   HGBA1C 5.9 (H) 01/23/2020   HGBA1C 5.8 04/10/2014   Lab Results  Component Value Date    INSULIN 17.8 06/12/2020   INSULIN 7.3 01/23/2020   Lab Results  Component Value Date   TSH 2.11 09/30/2018   Lab Results  Component Value Date   CHOL 237 (H) 12/31/2020   HDL 51.90 12/31/2020   LDLCALC 77 01/23/2020   LDLDIRECT 145.0 12/31/2020   TRIG 264.0 (H) 12/31/2020   CHOLHDL 5 12/31/2020   Lab Results  Component Value Date   VD25OH 67.2 06/12/2020   VD25OH 26.6 (L) 01/23/2020   VD25OH 55 09/22/2010   Lab Results  Component Value Date   WBC 8.1 12/31/2020   HGB 12.9 12/31/2020   HCT 39.5 12/31/2020   MCV 86.9 12/31/2020   PLT 305.0 12/31/2020   Lab Results  Component Value Date   FERRITIN 145 12/05/2020    Attestation Statements:   Reviewed by clinician on day of visit: allergies, medications, problem list, medical history, surgical history, family history, social history, and previous encounter notes.  I, Lizbeth Bark, RMA, am acting as Location manager for [employer], [place].  I have reviewed the above documentation for accuracy and completeness, and I agree with the above. - [name], FNP

## 2021-02-18 ENCOUNTER — Encounter (INDEPENDENT_AMBULATORY_CARE_PROVIDER_SITE_OTHER): Payer: Self-pay | Admitting: Family Medicine

## 2021-02-20 DIAGNOSIS — F319 Bipolar disorder, unspecified: Secondary | ICD-10-CM | POA: Diagnosis not present

## 2021-02-20 DIAGNOSIS — M4317 Spondylolisthesis, lumbosacral region: Secondary | ICD-10-CM | POA: Diagnosis not present

## 2021-02-20 DIAGNOSIS — J449 Chronic obstructive pulmonary disease, unspecified: Secondary | ICD-10-CM | POA: Diagnosis not present

## 2021-02-20 DIAGNOSIS — G959 Disease of spinal cord, unspecified: Secondary | ICD-10-CM | POA: Diagnosis not present

## 2021-02-20 DIAGNOSIS — Z89429 Acquired absence of other toe(s), unspecified side: Secondary | ICD-10-CM | POA: Diagnosis not present

## 2021-02-20 DIAGNOSIS — M4310 Spondylolisthesis, site unspecified: Secondary | ICD-10-CM | POA: Diagnosis not present

## 2021-02-20 DIAGNOSIS — M4316 Spondylolisthesis, lumbar region: Secondary | ICD-10-CM | POA: Diagnosis not present

## 2021-02-20 DIAGNOSIS — M069 Rheumatoid arthritis, unspecified: Secondary | ICD-10-CM | POA: Diagnosis not present

## 2021-02-20 DIAGNOSIS — M4802 Spinal stenosis, cervical region: Secondary | ICD-10-CM | POA: Diagnosis not present

## 2021-02-24 ENCOUNTER — Encounter: Payer: Medicare HMO | Admitting: Physical Therapy

## 2021-02-27 ENCOUNTER — Telehealth: Payer: Medicare HMO

## 2021-03-03 ENCOUNTER — Encounter: Payer: Medicare HMO | Admitting: Physical Therapy

## 2021-03-03 ENCOUNTER — Encounter: Payer: Self-pay | Admitting: Family Medicine

## 2021-03-04 MED ORDER — METRONIDAZOLE 0.75 % VA GEL: 1.0000 | Freq: Every day | VAGINAL | 0 refills | Status: AC

## 2021-03-10 ENCOUNTER — Encounter: Payer: Medicare HMO | Admitting: Physical Therapy

## 2021-03-12 ENCOUNTER — Other Ambulatory Visit: Payer: Self-pay

## 2021-03-12 ENCOUNTER — Ambulatory Visit (INDEPENDENT_AMBULATORY_CARE_PROVIDER_SITE_OTHER): Payer: Medicare HMO | Admitting: Physician Assistant

## 2021-03-12 ENCOUNTER — Encounter (INDEPENDENT_AMBULATORY_CARE_PROVIDER_SITE_OTHER): Payer: Self-pay | Admitting: Physician Assistant

## 2021-03-12 VITALS — BP 122/70 | HR 77 | Temp 98.2°F | Ht <= 58 in | Wt 147.0 lb

## 2021-03-12 DIAGNOSIS — E669 Obesity, unspecified: Secondary | ICD-10-CM | POA: Diagnosis not present

## 2021-03-12 DIAGNOSIS — R7303 Prediabetes: Secondary | ICD-10-CM

## 2021-03-12 DIAGNOSIS — Z6834 Body mass index (BMI) 34.0-34.9, adult: Secondary | ICD-10-CM | POA: Diagnosis not present

## 2021-03-12 MED ORDER — OZEMPIC (1 MG/DOSE) 4 MG/3ML ~~LOC~~ SOPN: 1.0000 mg | PEN_INJECTOR | SUBCUTANEOUS | 0 refills | Status: DC

## 2021-03-12 NOTE — Progress Notes (Signed)
? ? ? ?Chief Complaint:  ? ?OBESITY ?Raul is here to discuss her progress with her obesity treatment plan along with follow-up of her obesity related diagnoses. Hephzibah is on keeping a food journal and adhering to recommended goals of 1000-1100 calories and 80 grams of protein and states she is following her eating plan approximately 75% of the time. Chyrel states she is doing gym exercise for 60 minutes 2 times per week. ? ?Today's visit was #: 13 ?Starting weight: 148 lbs ?Starting date: 01/23/2020 ?Today's weight: 147 lbs ?Today's date: 03/12/2021 ?Total lbs lost to date: 1 lb ?Total lbs lost since last in-office visit: 3 lbs ? ?Interim History: Anjanae stated when she was journaling she was averaging [phone] calories and is unsure how much protein she is getting. She wants to start water aerobics and take her mom with her.  ? ?Subjective:  ? ?1. Pre-diabetes ?Levita states Ozempic decreases her appetite. Her last A1C was 5.7. She denies hypoglycemia.  ? ?Assessment/Plan:  ? ?1. Pre-diabetes ?We will refill Ozempic 1 mg for 1 month with no refills. Ethyle will continue to work on weight loss, exercise, and decreasing simple carbohydrates to help decrease the risk of diabetes.  ? ?2. Obesity: Current BMI 34.17 ?Latriece is currently in the action stage of change. As such, her goal is to continue with weight loss efforts. She has agreed to keeping a food journal and adhering to recommended goals of 1000-1100 calories and 80 grams of protein daily. ? ?Exercise goals:  As is.  ? ?Behavioral modification strategies: planning for success and keeping a strict food journal. ? ?Montia has agreed to follow-up with our clinic in 3 weeks. She was informed of the importance of frequent follow-up visits to maximize her success with intensive lifestyle modifications for her multiple health conditions.  ? ?Objective:  ? ?Blood pressure 122/70, pulse 77, temperature 98.2 ?F (36.8 ?C), height 4\' 7"  (1.397 m), weight 147 lb  (66.7 kg), SpO2 97 %. ?Body mass index is 34.17 kg/m?. ? ?General: Cooperative, alert, well developed, in no acute distress. ?HEENT: Conjunctivae and lids unremarkable. ?Cardiovascular: Regular rhythm.  ?Lungs: Normal work of breathing. ?Neurologic: No focal deficits.  ? ?Lab Results  ?Component Value Date  ? CREATININE 0.76 12/31/2020  ? BUN 16 12/31/2020  ? NA 139 12/31/2020  ? K 4.0 12/31/2020  ? CL 105 12/31/2020  ? CO2 28 12/31/2020  ? ?Lab Results  ?Component Value Date  ? ALT 41 (H) 12/31/2020  ? AST 39 (H) 12/31/2020  ? ALKPHOS 55 12/31/2020  ? BILITOT 0.4 12/31/2020  ? ?Lab Results  ?Component Value Date  ? HGBA1C 5.7 12/31/2020  ? HGBA1C 5.4 12/04/2020  ? HGBA1C 5.7 (H) 06/12/2020  ? HGBA1C 5.9 (H) 01/23/2020  ? HGBA1C 5.8 04/10/2014  ? ?Lab Results  ?Component Value Date  ? INSULIN 17.8 06/12/2020  ? INSULIN 7.3 01/23/2020  ? ?Lab Results  ?Component Value Date  ? TSH 2.11 09/30/2018  ? ?Lab Results  ?Component Value Date  ? CHOL 237 (H) 12/31/2020  ? HDL 51.90 12/31/2020  ? [place] 77 01/23/2020  ? LDLDIRECT 145.0 12/31/2020  ? TRIG 264.0 (H) 12/31/2020  ? CHOLHDL 5 12/31/2020  ? ?Lab Results  ?Component Value Date  ? VD25OH 67.2 06/12/2020  ? VD25OH 26.6 (L) 01/23/2020  ? VD25OH 55 09/22/2010  ? ?Lab Results  ?Component Value Date  ? WBC 8.1 12/31/2020  ? HGB 12.9 12/31/2020  ? HCT 39.5 12/31/2020  ? MCV 86.9 12/31/2020  ?  PLT 305.0 12/31/2020  ? ?Lab Results  ?Component Value Date  ? FERRITIN 145 12/05/2020  ? ? ?Obesity Behavioral Intervention:  ? ?Approximately 15 minutes were spent on the discussion below. ? ?ASK: ?We discussed the diagnosis of obesity with [name] today and Salah agreed to give [place] permission to discuss obesity behavioral modification therapy today. ? ?ASSESS: ?Makhayla has the diagnosis of obesity and her BMI today is 34.2. Jazmarie is in the action stage of change.  ? ?ADVISE: ?Keyandra was educated on the multiple health risks of obesity as well as the benefit of weight loss to  improve her health. She was advised of the need for long term treatment and the importance of lifestyle modifications to improve her current health and to decrease her risk of future health problems. ? ?AGREE: ?Multiple dietary modification options and treatment options were discussed and Nikiah agreed to follow the recommendations documented in the above note. ? ?ARRANGE: ?Kimmarie was educated on the importance of frequent visits to treat obesity as outlined per CMS and USPSTF guidelines and agreed to schedule her next follow up appointment today. ? ?Attestation Statements:  ? ?Reviewed by clinician on day of visit: allergies, medications, problem list, medical history, surgical history, family history, social history, and previous encounter notes. ? ?I, Tonye Pearson, am acting as Location manager for [employer], PA-C. ? ?I have reviewed the above documentation for accuracy and completeness, and I agree with the above. Abby Potash, PA-C ? ?

## 2021-04-02 ENCOUNTER — Other Ambulatory Visit: Payer: Self-pay | Admitting: Family Medicine

## 2021-04-03 ENCOUNTER — Ambulatory Visit (INDEPENDENT_AMBULATORY_CARE_PROVIDER_SITE_OTHER): Payer: Medicare HMO | Admitting: Physician Assistant

## 2021-04-08 ENCOUNTER — Ambulatory Visit (INDEPENDENT_AMBULATORY_CARE_PROVIDER_SITE_OTHER): Payer: Medicare HMO | Admitting: Physician Assistant

## 2021-04-08 DIAGNOSIS — Z79899 Other long term (current) drug therapy: Secondary | ICD-10-CM | POA: Diagnosis not present

## 2021-04-08 DIAGNOSIS — Z5181 Encounter for therapeutic drug level monitoring: Secondary | ICD-10-CM | POA: Diagnosis not present

## 2021-04-08 DIAGNOSIS — G894 Chronic pain syndrome: Secondary | ICD-10-CM | POA: Diagnosis not present

## 2021-04-08 DIAGNOSIS — M542 Cervicalgia: Secondary | ICD-10-CM | POA: Diagnosis not present

## 2021-04-08 DIAGNOSIS — M5136 Other intervertebral disc degeneration, lumbar region: Secondary | ICD-10-CM | POA: Diagnosis not present

## 2021-04-08 DIAGNOSIS — Z79891 Long term (current) use of opiate analgesic: Secondary | ICD-10-CM | POA: Diagnosis not present

## 2021-04-09 ENCOUNTER — Telehealth: Payer: Self-pay | Admitting: Family Medicine

## 2021-04-09 ENCOUNTER — Encounter (INDEPENDENT_AMBULATORY_CARE_PROVIDER_SITE_OTHER): Payer: Self-pay | Admitting: Physician Assistant

## 2021-04-09 ENCOUNTER — Ambulatory Visit (INDEPENDENT_AMBULATORY_CARE_PROVIDER_SITE_OTHER): Payer: Medicare HMO | Admitting: Physician Assistant

## 2021-04-09 ENCOUNTER — Other Ambulatory Visit: Payer: Self-pay

## 2021-04-09 VITALS — BP 122/69 | HR 69 | Temp 97.8°F | Ht <= 58 in | Wt 149.0 lb

## 2021-04-09 DIAGNOSIS — R7303 Prediabetes: Secondary | ICD-10-CM

## 2021-04-09 DIAGNOSIS — E669 Obesity, unspecified: Secondary | ICD-10-CM | POA: Diagnosis not present

## 2021-04-09 DIAGNOSIS — Z6834 Body mass index (BMI) 34.0-34.9, adult: Secondary | ICD-10-CM | POA: Diagnosis not present

## 2021-04-09 MED ORDER — OZEMPIC (1 MG/DOSE) 4 MG/3ML ~~LOC~~ SOPN: 1.0000 mg | PEN_INJECTOR | SUBCUTANEOUS | 0 refills | Status: DC

## 2021-04-09 NOTE — Telephone Encounter (Signed)
Left message for patient to call back and schedule Medicare Annual Wellness Visit (AWV) in office.  ? ?If not able to come in office, please offer to do virtually or by telephone.  Left office number and my jabber [phone]. ? ?Last AWV:03/15/2018 ? ?Please schedule at anytime with Nurse Health Advisor. ?  ?

## 2021-04-09 NOTE — Progress Notes (Signed)
? ? ? ?Chief Complaint:  ? ?OBESITY ?Jonel is here to discuss her progress with her obesity treatment plan along with follow-up of her obesity related diagnoses. Gera is on keeping a food journal and adhering to recommended goals of 1000-1100 calories and 80 grams of protein and states she is following her eating plan approximately 60-70% of the time. Lesia states she is doing yoga for 60 minutes 5 times per week. ? ?Today's visit was #: 14 ?Starting weight: 148 lbs ?Starting date: 01/23/2020 ?Today's weight: 149 lbs ?Today's date: 04/09/2021 ?Total lbs lost to date: 0 ?Total lbs lost since last in-office visit: 0 ? ?Interim History: Zarrah reports that she has a "salt addiction". She has been working a lot and has been making the best choice with her food choices when eating out.  ? ?Subjective:  ? ?1. Pre-diabetes ?Kenda is on Metformin 0.75 mg, which she increased on her own from 0.5 mg due to increased hunger. She is tolerating increased dose well.  ? ?Assessment/Plan:  ? ?1. Pre-diabetes ?We will refill Ozempic 1 mg for 1 month with no refills. Nelson will continue to work on weight loss, exercise, and decreasing simple carbohydrates to help decrease the risk of diabetes.  ? ?- Semaglutide, 1 MG/DOSE, (OZEMPIC, 1 MG/DOSE,) 4 MG/3ML SOPN; Inject 1 mg into the skin once a week.  Dispense: 3 mL; Refill: 0 ? ?2. Obesity: Current BMI 34.63 ?Samatha is currently in the action stage of change. As such, her goal is to continue with weight loss efforts. She has agreed to keeping a food journal and adhering to recommended goals of 1000-1100 calories and 80 grams of protein daily.  ? ?Exercise goals:  As is. ? ?Behavioral modification strategies: decreasing eating out, meal planning and cooking strategies, and keeping healthy foods in the home. ? ?Naisha has agreed to follow-up with our clinic in 3 weeks. She was informed of the importance of frequent follow-up visits to maximize her success with intensive  lifestyle modifications for her multiple health conditions.  ? ?Objective:  ? ?Blood pressure 122/69, pulse 69, temperature 97.8 ?F (36.6 ?C), temperature source Oral, height '4\' 7"'$  (1.397 m), weight 149 lb (67.6 kg), SpO2 94 %. ?Body mass index is 34.63 kg/m?. ? ?General: Cooperative, alert, well developed, in no acute distress. ?HEENT: Conjunctivae and lids unremarkable. ?Cardiovascular: Regular rhythm.  ?Lungs: Normal work of breathing. ?Neurologic: No focal deficits.  ? ?Lab Results  ?Component Value Date  ? CREATININE 0.76 12/31/2020  ? BUN 16 12/31/2020  ? NA 139 12/31/2020  ? K 4.0 12/31/2020  ? CL 105 12/31/2020  ? CO2 28 12/31/2020  ? ?Lab Results  ?Component Value Date  ? ALT 41 (H) 12/31/2020  ? AST 39 (H) 12/31/2020  ? ALKPHOS 55 12/31/2020  ? BILITOT 0.4 12/31/2020  ? ?Lab Results  ?Component Value Date  ? HGBA1C 5.7 12/31/2020  ? HGBA1C 5.4 12/04/2020  ? HGBA1C 5.7 (H) 06/12/2020  ? HGBA1C 5.9 (H) 01/23/2020  ? HGBA1C 5.8 04/10/2014  ? ?Lab Results  ?Component Value Date  ? INSULIN 17.8 06/12/2020  ? INSULIN 7.3 01/23/2020  ? ?Lab Results  ?Component Value Date  ? TSH 2.11 09/30/2018  ? ?Lab Results  ?Component Value Date  ? CHOL 237 (H) 12/31/2020  ? HDL 51.90 12/31/2020  ? [place] 77 01/23/2020  ? LDLDIRECT 145.0 12/31/2020  ? TRIG 264.0 (H) 12/31/2020  ? CHOLHDL 5 12/31/2020  ? ?Lab Results  ?Component Value Date  ? VD25OH 67.2 06/12/2020  ?  VD25OH 26.6 (L) 01/23/2020  ? VD25OH 55 09/22/2010  ? ?Lab Results  ?Component Value Date  ? WBC 8.1 12/31/2020  ? HGB 12.9 12/31/2020  ? HCT 39.5 12/31/2020  ? MCV 86.9 12/31/2020  ? PLT 305.0 12/31/2020  ? ?Lab Results  ?Component Value Date  ? FERRITIN 145 12/05/2020  ? ? ?Obesity Behavioral Intervention:  ? ?Approximately 15 minutes were spent on the discussion below. ? ?ASK: ?We discussed the diagnosis of obesity with [name] today and Rosalinda agreed to give [place] permission to discuss obesity behavioral modification therapy today. ? ?ASSESS: ?Danaja has the  diagnosis of obesity and her BMI today is 34.8. Phillippa is in the action stage of change.  ? ?ADVISE: ?Vianny was educated on the multiple health risks of obesity as well as the benefit of weight loss to improve her health. She was advised of the need for long term treatment and the importance of lifestyle modifications to improve her current health and to decrease her risk of future health problems. ? ?AGREE: ?Multiple dietary modification options and treatment options were discussed and Deshawn agreed to follow the recommendations documented in the above note. ? ?ARRANGE: ?Liliya was educated on the importance of frequent visits to treat obesity as outlined per CMS and USPSTF guidelines and agreed to schedule her next follow up appointment today. ? ?Attestation Statements:  ? ?Reviewed by clinician on day of visit: allergies, medications, problem list, medical history, surgical history, family history, social history, and previous encounter notes. ? ?I, Tonye Pearson, am acting as Location manager for [employer], PA-C. ? ?I have reviewed the above documentation for accuracy and completeness, and I agree with the above. Abby Potash, PA-C ? ?

## 2021-04-11 ENCOUNTER — Ambulatory Visit (INDEPENDENT_AMBULATORY_CARE_PROVIDER_SITE_OTHER): Payer: Medicare HMO

## 2021-04-11 ENCOUNTER — Ambulatory Visit: Payer: Medicare HMO

## 2021-04-11 VITALS — Ht <= 58 in | Wt 149.0 lb

## 2021-04-11 DIAGNOSIS — Z780 Asymptomatic menopausal state: Secondary | ICD-10-CM

## 2021-04-11 DIAGNOSIS — Z0000 Encounter for general adult medical examination without abnormal findings: Secondary | ICD-10-CM | POA: Diagnosis not present

## 2021-04-11 DIAGNOSIS — M8580 Other specified disorders of bone density and structure, unspecified site: Secondary | ICD-10-CM

## 2021-04-11 DIAGNOSIS — Z1231 Encounter for screening mammogram for malignant neoplasm of breast: Secondary | ICD-10-CM
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
## 2021-04-11 NOTE — Patient Instructions (Signed)
[name] , ?Thank you for taking time to come for your Medicare Wellness Visit. I appreciate your ongoing commitment to your health goals. Please review the following plan we discussed and let me know if I can assist you in the future.  ? ?Screening recommendations/referrals: ?Colonoscopy: Done 04/28/2017 Repeat in 10 years ? ?Mammogram: Done 08/10/2019 Repeat annually ? ?Bone Density: Done 08/10/2019 Repeat every 2 years ? ?Recommended yearly ophthalmology/optometry visit for glaucoma screening and checkup ?Recommended yearly dental visit for hygiene and checkup ? ?Vaccinations: ?Influenza vaccine: Done 12/07/2020 Repeat annually ? ?Pneumococcal vaccine: Done 11/05/2015 and 09/30/2018. ?Tdap vaccine: Done 03/05/2020 Repeat in 10 years ? ?Shingles vaccine: Done. Please bring copy of dates to next visit to enter into chart.    ?Covid-19:Done 11/25/2019, 02/23/2019, 01/23/2019 ? ?Advanced directives: Advance directive discussed with you today. Even though you declined this today, please call our office should you change your mind, and we can give you the proper paperwork for you to fill out. ? ? ?Conditions/risks identified: Aim for 30 minutes of exercise or brisk walking, 6-8 glasses of water, and 5 servings of fruits and vegetables each day. ?KEEP UP THE GOOD WORK!! ? ?Next appointment: Follow up in one year for your annual wellness visit 2024. ? ? ?Preventive Care 49 Years and Older, Female ?Preventive care refers to lifestyle choices and visits with your health care provider that can promote health and wellness. ?What does preventive care include? ?A yearly physical exam. This is also called an annual well check. ?Dental exams once or twice a year. ?Routine eye exams. Ask your health care provider how often you should have your eyes checked. ?Personal lifestyle choices, including: ?Daily care of your teeth and gums. ?Regular physical activity. ?Eating a healthy diet. ?Avoiding tobacco and drug use. ?Limiting alcohol  use. ?Practicing safe sex. ?Taking low-dose aspirin every day. ?Taking vitamin and mineral supplements as recommended by your health care provider. ?What happens during an annual well check? ?The services and screenings done by your health care provider during your annual well check will depend on your age, overall health, lifestyle risk factors, and family history of disease. ?Counseling  ?Your health care provider may ask you questions about your: ?Alcohol use. ?Tobacco use. ?Drug use. ?Emotional well-being. ?Home and relationship well-being. ?Sexual activity. ?Eating habits. ?History of falls. ?Memory and ability to understand (cognition). ?Work and work Statistician. ?Reproductive health. ?Screening  ?You may have the following tests or measurements: ?Height, weight, and BMI. ?Blood pressure. ?Lipid and cholesterol levels. These may be checked every 5 years, or more frequently if you are over 74 years old. ?Skin check. ?Lung cancer screening. You may have this screening every year starting at age 77 if you have a 30-pack-year history of smoking and currently smoke or have quit within the past 15 years. ?Fecal occult blood test (FOBT) of the stool. You may have this test every year starting at age 36. ?Flexible sigmoidoscopy or colonoscopy. You may have a sigmoidoscopy every 5 years or a colonoscopy every 10 years starting at age 40. ?Hepatitis C blood test. ?Hepatitis B blood test. ?Sexually transmitted disease (STD) testing. ?Diabetes screening. This is done by checking your blood sugar (glucose) after you have not eaten for a while (fasting). You may have this done every 1-3 years. ?Bone density scan. This is done to screen for osteoporosis. You may have this done starting at age 10. ?Mammogram. This may be done every 1-2 years. Talk to your health care provider about how often  you should have regular mammograms. ?Talk with your health care provider about your test results, treatment options, and if necessary,  the need for more tests. ?Vaccines  ?Your health care provider may recommend certain vaccines, such as: ?Influenza vaccine. This is recommended every year. ?Tetanus, diphtheria, and acellular pertussis (Tdap, Td) vaccine. You may need a Td booster every 10 years. ?Zoster vaccine. You may need this after age 38. ?Pneumococcal 13-valent conjugate (PCV13) vaccine. One dose is recommended after age 7. ?Pneumococcal polysaccharide (PPSV23) vaccine. One dose is recommended after age 67. ?Talk to your health care provider about which screenings and vaccines you need and how often you need them. ?This information is not intended to replace advice given to you by your health care provider. Make sure you discuss any questions you have with your health care provider. ?Document Released: 01/25/2015 Document Revised: 09/18/2015 Document Reviewed: 10/30/2014 ?Elsevier Interactive Patient Education ? 2017 [employer]. ? ?Fall Prevention in the Home ?Falls can cause injuries. They can happen to people of all ages. There are many things you can do to make your home safe and to help prevent falls. ?What can I do on the outside of my home? ?Regularly fix the edges of walkways and driveways and fix any cracks. ?Remove anything that might make you trip as you walk through a door, such as a raised step or threshold. ?Trim any bushes or trees on the path to your home. ?Use bright outdoor lighting. ?Clear any walking paths of anything that might make someone trip, such as rocks or tools. ?Regularly check to see if handrails are loose or broken. Make sure that both sides of any steps have handrails. ?Any raised decks and porches should have guardrails on the edges. ?Have any leaves, snow, or ice cleared regularly. ?Use sand or salt on walking paths during winter. ?Clean up any spills in your garage right away. This includes oil or grease spills. ?What can I do in the bathroom? ?Use night lights. ?Install grab bars by the toilet and in the  tub and shower. Do not use towel bars as grab bars. ?Use non-skid mats or decals in the tub or shower. ?If you need to sit down in the shower, use a plastic, non-slip stool. ?Keep the floor dry. Clean up any water that spills on the floor as soon as it happens. ?Remove soap buildup in the tub or shower regularly. ?Attach bath mats securely with double-sided non-slip rug tape. ?Do not have throw rugs and other things on the floor that can make you trip. ?What can I do in the bedroom? ?Use night lights. ?Make sure that you have a light by your bed that is easy to reach. ?Do not use any sheets or blankets that are too big for your bed. They should not hang down onto the floor. ?Have a firm chair that has side arms. You can use this for support while you get dressed. ?Do not have throw rugs and other things on the floor that can make you trip. ?What can I do in the kitchen? ?Clean up any spills right away. ?Avoid walking on wet floors. ?Keep items that you use a lot in easy-to-reach places. ?If you need to reach something above you, use a strong step stool that has a grab bar. ?Keep electrical cords out of the way. ?Do not use floor polish or wax that makes floors slippery. If you must use wax, use non-skid floor wax. ?Do not have throw rugs and other things on  the floor that can make you trip. ?What can I do with my stairs? ?Do not leave any items on the stairs. ?Make sure that there are handrails on both sides of the stairs and use them. Fix handrails that are broken or loose. Make sure that handrails are as long as the stairways. ?Check any carpeting to make sure that it is firmly attached to the stairs. Fix any carpet that is loose or worn. ?Avoid having throw rugs at the top or bottom of the stairs. If you do have throw rugs, attach them to the floor with carpet tape. ?Make sure that you have a light switch at the top of the stairs and the bottom of the stairs. If you do not have them, ask someone to add them for  you. ?What else can I do to help prevent falls? ?Wear shoes that: ?Do not have high heels. ?Have rubber bottoms. ?Are comfortable and fit you well. ?Are closed at the toe. Do not wear sandals. ?If you

## 2021-04-15 ENCOUNTER — Encounter (HOSPITAL_BASED_OUTPATIENT_CLINIC_OR_DEPARTMENT_OTHER): Payer: Self-pay

## 2021-04-15 ENCOUNTER — Ambulatory Visit (HOSPITAL_BASED_OUTPATIENT_CLINIC_OR_DEPARTMENT_OTHER)
Admission: RE | Admit: 2021-04-15 | Discharge: 2021-04-15 | Disposition: A | Discharge status: Home / Self Care | Payer: Medicare HMO | Source: Ambulatory Visit | Attending: Family Medicine | Admitting: Family Medicine

## 2021-04-15 DIAGNOSIS — Z780 Asymptomatic menopausal state: Secondary | ICD-10-CM | POA: Insufficient documentation

## 2021-04-15 DIAGNOSIS — Z1231 Encounter for screening mammogram for malignant neoplasm of breast: Secondary | ICD-10-CM | POA: Insufficient documentation

## 2021-04-15 DIAGNOSIS — M8580 Other specified disorders of bone density and structure, unspecified site: Secondary | ICD-10-CM | POA: Diagnosis not present

## 2021-04-16 ENCOUNTER — Other Ambulatory Visit: Payer: Self-pay | Admitting: Family Medicine

## 2021-04-16 DIAGNOSIS — F329 Major depressive disorder, single episode, unspecified: Secondary | ICD-10-CM

## 2021-04-30 ENCOUNTER — Ambulatory Visit (INDEPENDENT_AMBULATORY_CARE_PROVIDER_SITE_OTHER): Payer: Medicare HMO | Admitting: Physician Assistant

## 2021-04-30 ENCOUNTER — Encounter (INDEPENDENT_AMBULATORY_CARE_PROVIDER_SITE_OTHER): Payer: Self-pay | Admitting: Physician Assistant

## 2021-04-30 VITALS — BP 98/61 | Ht <= 58 in | Wt 149.0 lb

## 2021-04-30 DIAGNOSIS — E669 Obesity, unspecified: Secondary | ICD-10-CM

## 2021-04-30 DIAGNOSIS — R7303 Prediabetes: Secondary | ICD-10-CM | POA: Diagnosis not present

## 2021-04-30 DIAGNOSIS — Z6834 Body mass index (BMI) 34.0-34.9, adult: Secondary | ICD-10-CM | POA: Diagnosis not present

## 2021-04-30 MED ORDER — OZEMPIC (1 MG/DOSE) 4 MG/3ML ~~LOC~~ SOPN: 1.0000 mg | PEN_INJECTOR | SUBCUTANEOUS | 0 refills | Status: DC

## 2021-05-07 DIAGNOSIS — M545 Low back pain, unspecified: Secondary | ICD-10-CM | POA: Diagnosis not present

## 2021-05-08 ENCOUNTER — Other Ambulatory Visit: Payer: Self-pay | Admitting: Family Medicine

## 2021-05-08 DIAGNOSIS — G2581 Restless legs syndrome: Secondary | ICD-10-CM

## 2021-05-08 DIAGNOSIS — I10 Essential (primary) hypertension: Secondary | ICD-10-CM

## 2021-05-12 NOTE — Progress Notes (Signed)
? ? ? ?Chief Complaint:  ? ?OBESITY ?Elida is here to discuss her progress with her obesity treatment plan along with follow-up of her obesity related diagnoses. Julissa is on keeping a food journal and adhering to recommended goals of 1000-1100 calories and 80 grams of protein and states she is following her eating plan approximately 75% of the time. Karalynn states she is doing Yoga and Zumba for 50 minutes 2-3 times per week. ? ?Today's visit was #: 15 ?Starting weight: 148 lbs ?Starting date: 01/23/2020 ?Today's weight: 149 lbs ?Today's date: 04/30/2021 ?Total lbs lost to date: 0 ?Total lbs lost since last in-office visit: 0 ? 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 When she eats out, she eats salad more often than not but is choosing [employer] salads. She is only eating 1/2 a serving of whatever she is eating, therefore not getting enough protein. ? ?Subjective:  ? ?1. Pre-diabetes ?Loida is currently on Ozempic 1 mg. She reports abdominal distention and reports hunger intermittently.  ? ?Assessment/Plan:  ? ?1. Pre-diabetes ?We will refill Ozempic for 1 month. Fendi will continue to work on weight loss, exercise, and decreasing simple carbohydrates to help decrease the risk of diabetes.  ? ?-Refill Semaglutide, 1 MG/DOSE, (OZEMPIC, 1 MG/DOSE,) 4 MG/3ML SOPN; Inject 1 mg into the skin once a week.  Dispense: 3 mL; Refill: 0 ? ?2. Obesity: Current BMI 34.63 ?Ameyah is currently in the action stage of change. As such, her goal is to continue with weight loss efforts. She has agreed to keeping a food journal and adhering to recommended goals of 1000-1100 calories and 80 grams of protein.  ? ?Exercise goals: As is. ? ?Behavioral modification strategies: increasing lean protein intake and meal planning and cooking strategies. ? ?Haedyn has agreed to follow-up with our clinic in 4 weeks. She was informed of the importance of frequent follow-up visits to maximize her  success with intensive lifestyle modifications for her multiple health conditions.  ? ?Objective:  ? ?Blood pressure 98/61, height '4\' 7"'$  (1.397 m), weight 149 lb (67.6 kg). ?Body mass index is 34.63 kg/m?. ? ?General: Cooperative, alert, well developed, in no acute distress. ?HEENT: Conjunctivae and lids unremarkable. ?Cardiovascular: Regular rhythm.  ?Lungs: Normal work of breathing. ?Neurologic: No focal deficits.  ? ?Lab Results  ?Component Value Date  ? CREATININE 0.76 12/31/2020  ? BUN 16 12/31/2020  ? NA 139 12/31/2020  ? K 4.0 12/31/2020  ? CL 105 12/31/2020  ? CO2 28 12/31/2020  ? ?Lab Results  ?Component Value Date  ? ALT 41 (H) 12/31/2020  ? AST 39 (H) 12/31/2020  ? ALKPHOS 55 12/31/2020  ? BILITOT 0.4 12/31/2020  ? ?Lab Results  ?Component Value Date  ? HGBA1C 5.7 12/31/2020  ? HGBA1C 5.4 12/04/2020  ? HGBA1C 5.7 (H) 06/12/2020  ? HGBA1C 5.9 (H) 01/23/2020  ? HGBA1C 5.8 04/10/2014  ? ?Lab Results  ?Component Value Date  ? INSULIN 17.8 06/12/2020  ? INSULIN 7.3 01/23/2020  ? ?Lab Results  ?Component Value Date  ? TSH 2.11 09/30/2018  ? ?Lab Results  ?Component Value Date  ? CHOL 237 (H) 12/31/2020  ? HDL 51.90 12/31/2020  ? [place] 77 01/23/2020  ? LDLDIRECT 145.0 12/31/2020  ? TRIG 264.0 (H) 12/31/2020  ? CHOLHDL 5 12/31/2020  ? ?Lab Results  ?Component Value Date  ? VD25OH 67.2 06/12/2020  ? VD25OH 26.6 (L) 01/23/2020  ? VD25OH 55 09/22/2010  ? ?Lab Results  ?Component Value  Date  ? WBC 8.1 12/31/2020  ? HGB 12.9 12/31/2020  ? HCT 39.5 12/31/2020  ? MCV 86.9 12/31/2020  ? PLT 305.0 12/31/2020  ? ?Lab Results  ?Component Value Date  ? FERRITIN 145 12/05/2020  ? ? ?Obesity Behavioral Intervention:  ? ?Approximately 15 minutes were spent on the discussion below. ? ?ASK: ?We discussed the diagnosis of obesity with [name] today and Ariyon agreed to give [place] permission to discuss obesity behavioral modification therapy today. ? ?ASSESS: ?Macala has the diagnosis of obesity and her BMI today is 34.6. Kerilyn  is in the action stage of change.  ? ?ADVISE: ?Jayelle was educated on the multiple health risks of obesity as well as the benefit of weight loss to improve her health. She was advised of the need for long term treatment and the importance of lifestyle modifications to improve her current health and to decrease her risk of future health problems. ? ?AGREE: ?Multiple dietary modification options and treatment options were discussed and Leilanni agreed to follow the recommendations documented in the above note. ? ?ARRANGE: ?Kelty was educated on the importance of frequent visits to treat obesity as outlined per CMS and USPSTF guidelines and agreed to schedule her next follow up appointment today. ? ?Attestation Statements:  ? ?Reviewed by clinician on day of visit: allergies, medications, problem list, medical history, surgical history, family history, social history, and previous encounter notes. ? ?I, Brendell Tyus, am acting as transcriptionist for [employer], PA-C. ? ?I have reviewed the above documentation for accuracy and completeness, and I agree with the above. Abby Potash, PA-C ? ?

## 2021-05-27 ENCOUNTER — Ambulatory Visit (INDEPENDENT_AMBULATORY_CARE_PROVIDER_SITE_OTHER): Payer: Medicare HMO | Admitting: Family Medicine

## 2021-05-27 ENCOUNTER — Encounter: Payer: Self-pay | Admitting: Family Medicine

## 2021-05-27 VITALS — BP 130/70 | HR 91 | Temp 98.7°F | Resp 18 | Ht <= 58 in | Wt 151.8 lb

## 2021-05-27 DIAGNOSIS — E785 Hyperlipidemia, unspecified: Secondary | ICD-10-CM | POA: Diagnosis not present

## 2021-05-27 DIAGNOSIS — R7303 Prediabetes: Secondary | ICD-10-CM | POA: Diagnosis not present

## 2021-05-27 DIAGNOSIS — K5901 Slow transit constipation: Secondary | ICD-10-CM

## 2021-05-27 DIAGNOSIS — Z79899 Other long term (current) drug therapy: Secondary | ICD-10-CM

## 2021-05-27 DIAGNOSIS — F909 Attention-deficit hyperactivity disorder, unspecified type: Secondary | ICD-10-CM

## 2021-05-27 DIAGNOSIS — F319 Bipolar disorder, unspecified: Secondary | ICD-10-CM | POA: Diagnosis not present

## 2021-05-27 MED ORDER — METHYLPHENIDATE HCL 5 MG PO TABS
5.0000 mg | ORAL_TABLET | Freq: Two times a day (BID) | ORAL | 0 refills | Status: AC
Start: 2021-05-27 — End: ?

## 2021-05-27 MED ORDER — LINACLOTIDE 72 MCG PO CAPS: 72.0000 ug | ORAL_CAPSULE | Freq: Every day | ORAL | 2 refills | Status: AC

## 2021-05-27 NOTE — Patient Instructions (Signed)
Living With Attention Deficit Hyperactivity Disorder If you have been diagnosed with attention deficit hyperactivity disorder (ADHD), you may be relieved that you now know why you have felt or behaved a certain way. Still, you may feel overwhelmed about the treatment ahead. You may also wonder how to get the support you need and how to deal with the condition day-to-day. With treatment and support, you can live with ADHD and manage your symptoms. How to manage lifestyle changes Managing stress Stress is your body's reaction to life changes and events, both good and bad. To cope with the stress of an ADHD diagnosis, it may help to: Learn more about ADHD. Exercise regularly. Even a short daily walk can lower stress levels. Participate in training or education programs (including social skills training classes) that teach you to deal with symptoms.  Medicines Your health care provider may suggest certain medicines if he or she feels that they will help to improve your condition. Stimulant medicines are usually prescribed to treat ADHD, and therapy may also be prescribed. It is important to: Avoid using alcohol and other substances that may prevent your medicines from working properly (may interact). Talk with your pharmacist or health care provider about all the medicines that you take, their possible side effects, and what medicines are safe to take together. Make it your goal to take part in all treatment decisions (shared decision-making). Ask about possible side effects of medicines that your health care provider recommends, and tell him or her how you feel about having those side effects. It is best if shared decision-making with your health care provider is part of your total treatment plan. Relationships To strengthen your relationships with family members while treating your condition, consider taking part in family therapy. You might also attend self-help groups alone or with a loved one. Be  honest about how your symptoms affect your relationships. Make an effort to communicate respectfully instead of fighting, and find ways to show others that you care. Psychotherapy may be useful in helping you cope with how ADHD affects your relationships. How to recognize changes in your condition The following signs may mean that your treatment is working well and your condition is improving: Consistently being on time for appointments. Being more organized at home and work. Other people noticing improvements in your behavior. Achieving goals that you set for yourself. Thinking more clearly. The following signs may mean that your treatment is not working very well: Feeling impatience or more confusion. Missing, forgetting, or being late for appointments. An increasing sense of disorganization and messiness. More difficulty in reaching goals that you set for yourself. Loved ones becoming angry or frustrated with you. Follow these instructions at home: Take over-the-counter and prescription medicines only as told by your health care provider. Check with your health care provider before taking any new medicines. Create structure and an organized atmosphere at home. For example: Make a list of tasks, then rank them from most important to least important. Work on one task at a time until your listed tasks are done. Make a daily schedule and follow it consistently every day. Use an appointment calendar, and check it 2 or 3 times a day to keep on track. Keep it with you when you leave the house. Create spaces where you keep certain things, and always put things back in their places after you use them. Keep all follow-up visits as told by your health care provider. This is important. Where to find support Talking to others    Keep emotion out of important discussions and speak in a calm, logical way. Listen closely and patiently to your loved ones. Try to understand their point of view, and try to  avoid getting defensive. Take responsibility for the consequences of your actions. Ask that others do not take your behaviors personally. Aim to solve problems as they come up, and express your feelings instead of bottling them up. Talk openly about what you need from your loved ones and how they can support you. Consider going to family therapy sessions or having your family meet with a specialist who deals with ADHD-related behavior problems. Finances Not all insurance plans cover mental health care, so it is important to check with your insurance carrier. If paying for co-pays or counseling services is a problem, search for a local or county mental health care center. Public mental health care services may be offered there at a low cost or no cost when you are not able to see a private health care provider. If you are taking medicine for ADHD, you may be able to get the generic form, which may be less expensive than brand-name medicine. Some makers of prescription medicines also offer help to patients who cannot afford the medicines that they need. Questions to ask your health care provider: What are the risks and benefits of taking medicines? Would I benefit from therapy? How often should I follow up with a health care provider? Contact a health care provider if: You have side effects from your medicines, such as: Repeated muscle twitches, coughing, or speech outbursts. Sleep problems. Loss of appetite. Depression. New or worsening behavior problems. Dizziness. Unusually fast heartbeat. Stomach pains. Headaches. Get help right away if: You have a severe reaction to a medicine. Your behavior suddenly gets worse. Summary With treatment and support, you can live with ADHD and manage your symptoms. The medicines that are most often prescribed for ADHD are stimulants. Consider taking part in family therapy or self-help groups with family members or friends. When you talk with friends  and family about your ADHD, be patient and communicate openly. Take over-the-counter and prescription medicines only as told by your health care provider. Check with your health care provider before taking any new medicines. This information is not intended to replace advice given to you by your health care provider. Make sure you discuss any questions you have with your health care provider. Document Revised: 05/12/2019 Document Reviewed: 06/14/2019 Elsevier Patient Education  2023 Elsevier Inc.  
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
## 2021-05-28 ENCOUNTER — Ambulatory Visit (INDEPENDENT_AMBULATORY_CARE_PROVIDER_SITE_OTHER): Payer: Medicare HMO | Admitting: Physician Assistant

## 2021-05-28 ENCOUNTER — Telehealth: Payer: Self-pay | Admitting: Family Medicine

## 2021-05-28 LAB — LIPID PANEL
Cholesterol: 140 mg/dL (ref 0–200)
HDL: 37 mg/dL — ABNORMAL LOW (ref >39.00)
LDL Cholesterol: 64 mg/dL (ref 0–99)
NonHDL: 103.02
Total CHOL/HDL Ratio: 4
Triglycerides: 196 mg/dL — ABNORMAL HIGH (ref 0.0–149.0)
VLDL: 39.2 mg/dL (ref 0.0–40.0)

## 2021-05-28 LAB — COMPREHENSIVE METABOLIC PANEL
ALT: 38 U/L — ABNORMAL HIGH (ref 0–35)
AST: 38 U/L — ABNORMAL HIGH (ref 0–37)
Albumin: 4.7 g/dL (ref 3.5–5.2)
Alkaline Phosphatase: 45 U/L (ref 39–117)
BUN: 16 mg/dL (ref 6–23)
CO2: 26 mEq/L (ref 19–32)
Calcium: 10.3 mg/dL (ref 8.4–10.5)
Chloride: 105 mEq/L (ref 96–112)
Creatinine, Ser: 0.94 mg/dL (ref 0.40–1.20)
GFR: 61.11 mL/min (ref >60.00)
Glucose, Bld: 83 mg/dL (ref 70–99)
Potassium: 4 mEq/L (ref 3.5–5.1)
Sodium: 139 mEq/L (ref 135–145)
Total Bilirubin: 0.5 mg/dL (ref 0.2–1.2)
Total Protein: 7.1 g/dL (ref 6.0–8.3)

## 2021-05-28 LAB — CBC WITH DIFFERENTIAL/PLATELET
Basophils Absolute: 0.1 10*3/uL (ref 0.0–0.1)
Basophils Relative: 1.1 % (ref 0.0–3.0)
Eosinophils Absolute: 0.5 10*3/uL (ref 0.0–0.7)
Eosinophils Relative: 5.1 % — ABNORMAL HIGH (ref 0.0–5.0)
HCT: 37.6 % (ref 36.0–46.0)
Hemoglobin: 12.4 g/dL (ref 12.0–15.0)
Lymphocytes Relative: 31.5 % (ref 12.0–46.0)
Lymphs Abs: 2.9 10*3/uL (ref 0.7–4.0)
MCHC: 33 g/dL (ref 30.0–36.0)
MCV: 86.4 fl (ref 78.0–100.0)
Monocytes Absolute: 0.7 10*3/uL (ref 0.1–1.0)
Monocytes Relative: 7.7 % (ref 3.0–12.0)
Neutro Abs: 5 10*3/uL (ref 1.4–7.7)
Neutrophils Relative %: 54.6 % (ref 43.0–77.0)
Platelets: 397 10*3/uL (ref 150.0–400.0)
RBC: 4.35 Mil/uL (ref 3.87–5.11)
RDW: 13.9 % (ref 11.5–15.5)
WBC: 9.2 10*3/uL (ref 4.0–10.5)

## 2021-05-28 LAB — HEMOGLOBIN A1C: Hgb A1c MFr Bld: 5.5 % (ref 4.6–6.5)

## 2021-05-28 MED ORDER — VALACYCLOVIR HCL 1 G PO TABS: 1000.0000 mg | ORAL_TABLET | Freq: Two times a day (BID) | ORAL | 1 refills | Status: AC | PRN

## 2021-05-28 NOTE — Telephone Encounter (Signed)
Refill sent.

## 2021-05-28 NOTE — Telephone Encounter (Signed)
Pt states during yesterday's visit lowne informed her that she would refill rx for valacyclovir 1000 MG  ? ?

## 2021-05-29 DIAGNOSIS — M545 Low back pain, unspecified: Secondary | ICD-10-CM | POA: Diagnosis not present

## 2021-05-29 LAB — DRUG MONITORING PANEL 376104, URINE
Amphetamines: NEGATIVE ng/mL (ref <500)
Barbiturates: NEGATIVE ng/mL (ref <300)
Benzodiazepines: NEGATIVE ng/mL (ref <100)
Cocaine Metabolite: NEGATIVE ng/mL (ref <150)
Desmethyltramadol: 1383 ng/mL — ABNORMAL HIGH (ref <100)
Opiates: NEGATIVE ng/mL (ref <100)
Oxycodone: NEGATIVE ng/mL (ref <100)
Tramadol: 7408 ng/mL — ABNORMAL HIGH (ref <100)

## 2021-05-29 LAB — DM TEMPLATE

## 2021-06-01 ENCOUNTER — Other Ambulatory Visit: Payer: Self-pay | Admitting: Family Medicine

## 2021-06-01 DIAGNOSIS — F411 Generalized anxiety disorder: Secondary | ICD-10-CM

## 2021-06-02 ENCOUNTER — Ambulatory Visit (INDEPENDENT_AMBULATORY_CARE_PROVIDER_SITE_OTHER): Payer: Medicare HMO | Admitting: Nurse Practitioner

## 2021-06-18 ENCOUNTER — Other Ambulatory Visit: Payer: Self-pay | Admitting: Family Medicine

## 2021-06-18 DIAGNOSIS — S134XXA Sprain of ligaments of cervical spine, initial encounter: Secondary | ICD-10-CM

## 2021-06-19 MED ORDER — CYCLOBENZAPRINE HCL 10 MG PO TABS: ORAL_TABLET | ORAL | 0 refills | Status: DC

## 2021-06-23 DIAGNOSIS — M545 Low back pain, unspecified: Secondary | ICD-10-CM | POA: Diagnosis not present

## 2021-06-26 ENCOUNTER — Ambulatory Visit (INDEPENDENT_AMBULATORY_CARE_PROVIDER_SITE_OTHER): Payer: Medicare HMO | Admitting: Nurse Practitioner

## 2021-06-26 ENCOUNTER — Encounter (INDEPENDENT_AMBULATORY_CARE_PROVIDER_SITE_OTHER): Payer: Self-pay | Admitting: Family Medicine

## 2021-06-26 ENCOUNTER — Ambulatory Visit (INDEPENDENT_AMBULATORY_CARE_PROVIDER_SITE_OTHER): Payer: Medicare HMO | Admitting: Family Medicine

## 2021-06-26 VITALS — BP 121/75 | HR 81 | Temp 98.4°F | Ht <= 58 in | Wt 150.0 lb

## 2021-06-26 DIAGNOSIS — E559 Vitamin D deficiency, unspecified: Secondary | ICD-10-CM

## 2021-06-26 DIAGNOSIS — K581 Irritable bowel syndrome with constipation: Secondary | ICD-10-CM | POA: Diagnosis not present

## 2021-06-26 DIAGNOSIS — Z6834 Body mass index (BMI) 34.0-34.9, adult: Secondary | ICD-10-CM

## 2021-06-26 DIAGNOSIS — E669 Obesity, unspecified: Secondary | ICD-10-CM

## 2021-06-26 DIAGNOSIS — R7303 Prediabetes: Secondary | ICD-10-CM

## 2021-06-26 MED ORDER — METFORMIN HCL 500 MG PO TABS: ORAL_TABLET | ORAL | 0 refills | Status: DC

## 2021-06-27 LAB — VITAMIN D 25 HYDROXY (VIT D DEFICIENCY, FRACTURES): Vit D, 25-Hydroxy: 47.7 ng/mL (ref 30.0–100.0)

## 2021-07-14 ENCOUNTER — Ambulatory Visit (INDEPENDENT_AMBULATORY_CARE_PROVIDER_SITE_OTHER): Payer: Medicare HMO | Admitting: Nurse Practitioner

## 2021-07-14 ENCOUNTER — Encounter (INDEPENDENT_AMBULATORY_CARE_PROVIDER_SITE_OTHER): Payer: Self-pay | Admitting: Nurse Practitioner

## 2021-07-14 VITALS — BP 111/73 | HR 80 | Temp 98.0°F | Ht <= 58 in | Wt 148.0 lb

## 2021-07-14 DIAGNOSIS — R7303 Prediabetes: Secondary | ICD-10-CM

## 2021-07-14 DIAGNOSIS — E669 Obesity, unspecified: Secondary | ICD-10-CM | POA: Diagnosis not present

## 2021-07-14 DIAGNOSIS — Z6834 Body mass index (BMI) 34.0-34.9, adult: Secondary | ICD-10-CM

## 2021-07-14 DIAGNOSIS — E559 Vitamin D deficiency, unspecified: Secondary | ICD-10-CM

## 2021-07-15 NOTE — Progress Notes (Unsigned)
Chief Complaint:   OBESITY [name] is here to discuss her progress with her obesity treatment plan along with follow-up of her obesity related diagnoses. [name] is on the Category 1 Plan or keeping a food journal and adhering to recommended goals of 1000 calories and 80 grams of protein daily and states she is following her eating plan approximately 75% of the time. [name] states she is doing yoga for 60 minutes 2 times per week.  Today's visit was #: 17 Starting weight: 148 lbs Starting date: 01/23/2020 Today's weight: 148 lbs Today's date: 07/14/2021 Total lbs lost to date: 0 Total lbs lost since last in-office visit: 2  Interim History: [name] has done well with weight loss since her last visit.  She just got back from vacation 2 days ago.  She thinks that she is averaging around 1500 calories and she is unsure of her protein intake.  She tries to eat protein with each meal.  She does not measure her protein.  She is drinking water daily.  Subjective:   1. Pre-diabetes [name] has been taking metformin 500 mg daily for 1 week.  She denies side effects.  She took Ozempic in the past and stopped due to cost.  She denies polyphagia or cravings.  2. Vitamin D deficiency [name] is taking vitamin D 2-3/day.  She is unsure of the dose.  She denies side effects of nausea, vomiting, or muscle weakness.  I discussed labs with the patient today.  Assessment/Plan:   1. Pre-diabetes [name] will continue metformin 500 mg daily.  [name] will continue to work on weight loss, exercise, and decreasing simple carbohydrates to help decrease the risk of diabetes.    2. Vitamin D deficiency [name] is to bring in her vitamin D with dosage to her next visit.  3. Obesity: Current BMI 34.6 [name] is currently in the action stage of change. As such, her goal is to continue with weight loss efforts. She has agreed to the Category 1 Plan or keeping a food journal and adhering to recommended  goals of 1000 calories and 80+ grams of protein daily.   I had a long discussion with the patient about journaling her calories and protein, 2 ounces of substitute protein, making smarter food choices,  and Category 1 meal plan; snack ideas were given.  Exercise goals: Water aerobics.  Behavioral modification strategies: increasing lean protein intake, increasing water intake, planning for success, and keeping a strict food journal.  [name] has agreed to follow-up with our clinic in 3 weeks. She was informed of the importance of frequent follow-up visits to maximize her success with intensive lifestyle modifications for her multiple health conditions.   Objective:   Blood pressure 111/73, pulse 80, temperature 98 F (36.7 C), height '4\' 7"'$  (1.397 m), weight 148 lb (67.1 kg), SpO2 93 %. Body mass index is 34.4 kg/m.  General: Cooperative, alert, well developed, in no acute distress. HEENT: Conjunctivae and lids unremarkable. Cardiovascular: Regular rhythm.  Lungs: Normal work of breathing. Neurologic: No focal deficits.   Lab Results  Component Value Date   CREATININE 0.94 05/27/2021   BUN 16 05/27/2021   NA 139 05/27/2021   K 4.0 05/27/2021   CL 105 05/27/2021   CO2 26 05/27/2021   Lab Results  Component Value Date   ALT 38 (H) 05/27/2021   AST 38 (H) 05/27/2021   ALKPHOS 45 05/27/2021   BILITOT 0.5 05/27/2021   Lab Results  Component Value Date   HGBA1C  5.5 05/27/2021   HGBA1C 5.7 12/31/2020   HGBA1C 5.4 12/04/2020   HGBA1C 5.7 (H) 06/12/2020   HGBA1C 5.9 (H) 01/23/2020   Lab Results  Component Value Date   INSULIN 17.8 06/12/2020   INSULIN 7.3 01/23/2020   Lab Results  Component Value Date   TSH 2.11 09/30/2018   Lab Results  Component Value Date   CHOL 140 05/27/2021   HDL 37.00 (L) 05/27/2021   LDLCALC 64 05/27/2021   LDLDIRECT 145.0 12/31/2020   TRIG 196.0 (H) 05/27/2021   CHOLHDL 4 05/27/2021   Lab Results  Component Value Date   VD25OH 47.7  06/26/2021   VD25OH 67.2 06/12/2020   VD25OH 26.6 (L) 01/23/2020   Lab Results  Component Value Date   WBC 9.2 05/27/2021   HGB 12.4 05/27/2021   HCT 37.6 05/27/2021   MCV 86.4 05/27/2021   PLT 397.0 05/27/2021   Lab Results  Component Value Date   FERRITIN 145 12/05/2020   Attestation Statements:   Reviewed by clinician on day of visit: allergies, medications, problem list, medical history, surgical history, family history, social history, and previous encounter notes.  I spent 30 minutes with the patient and with reviewing the chart before and after her visit.    [name], am acting as Location manager for [employer], FNP-C.  I have reviewed the above documentation for accuracy and completeness, and I agree with the above. Everardo Pacific, FNP

## 2021-07-16 DIAGNOSIS — M545 Low back pain, unspecified: Secondary | ICD-10-CM | POA: Diagnosis not present

## 2021-07-31 NOTE — Progress Notes (Signed)
TeleHealth Visit:  This visit was completed with telemedicine (audio/video) technology. [name] has verbally consented to this TeleHealth visit. The patient is located at home, the provider is located at home. The participants in this visit include the listed provider and patient. The visit was conducted today via MyChart video.  OBESITY [name] is here to discuss her progress with her obesity treatment plan along with follow-up of her obesity related diagnoses.   Today's visit was # 18 Starting weight: 148 lbs Starting date: 01/23/2020 Weight at last in office visit: 148 lbs on 07/14/21 Total weight loss: 0 lbs at last in office visit on 07/14/21. Today's reported weight: 152.5 lbs   Nutrition Plan: the Category 1 Plan and keeping a food journal and adhering to recommended goals of 1000 calories and 80+ gms protein.  Hunger is poorly controlled.  Current exercise: yoga occasionally   Interim History: [name] reports that polyphagia keeps her from being able to eat only 1000 cal/day.  She generally averages around 1200/day, protein averages 70 g/day.  RMR is 1347.  She feels that the reason she is not losing weight is because she is not exercising enough.  She reports being too tired to exercise after working-she works 2 days/week generally.  She goes in the pool at her apartment complex quite a bit. She says she mostly eats "allowed" foods However occasionally she eats unhealthy foods such as [place] food. Assessment/Plan:  1. Prediabetes [name] has a diagnosis of prediabetes based on her elevated HgA1c. Medication(s): Metformin 500 mg once daily.  Tolerating well. Lab Results  Component Value Date   HGBA1C 5.5 05/27/2021   Lab Results  Component Value Date   INSULIN 17.8 06/12/2020   INSULIN 7.3 01/23/2020    Plan: Increase dose of metformin. Refill metformin 500 mg twice daily with meals.   2. Vitamin D Deficiency Vitamin D is nearly at goal of 50-last vitamin D was  47 on 06/26/2021. She is taking OTC vitamin D3 5000 IU - 3 capsules/day (she showed me the bottle).  However she does not always remember to take it. Lab Results  Component Value Date   VD25OH 47.7 06/26/2021   VD25OH 67.2 06/12/2020   VD25OH 26.6 (L) 01/23/2020    Plan: She will take 5000 IU of OTC vitamin D3 daily.   3. Obesity: Current BMI 34.4 [name] is currently in the action stage of change. As such, her goal is to continue with weight loss efforts.  She has agreed to keeping a food journal and adhering to recommended goals of 1000 calories and 80+ gms protein.   Exercise goals: Encouraged her to increase her exercise.  We discussed that she can walk across the pool several times for exercise.  Behavioral modification strategies: increasing lean protein intake, decreasing simple carbohydrates, planning for success, and keeping a strict food journal.  1.Discussed that with her low metabolism she needs to eat around 1000 cal/day for weight loss.  Advised that even healthy foods can cause weight gain if calorie intake is too high.  2.  She brought up weight loss surgery and we discussed that she is a candidate at 153 pounds (35 BMI).  She has hyperlipidemia and sleep apnea.  Hypertension is on her problem list but she does not take antihypertensives. Information was provided to sign up for the bariatric surgery seminar [facility] seminar). Patient was advised that bariatric surgery is a tool for weight loss and will not be successful long-term without significant dietary changes and  exercise.   [name] has agreed to follow-up with our clinic in 4 weeks.   No orders of the defined types were placed in this encounter.   Medications Discontinued During This Encounter  Medication Reason   metFORMIN (GLUCOPHAGE) 500 MG tablet Reorder     Meds ordered this encounter  Medications   metFORMIN (GLUCOPHAGE) 500 MG tablet    Sig: Take 1 tablet (500 mg total)  by mouth 2 (two) times daily with a meal. 1 po with lunch daily    Dispense:  60 tablet    Refill:  0    30 d supply;  ** OV for RF **   Do not send RF request    Order Specific Question:   Supervising Provider    Answer:   Dennard Nip D [[id number]]      Objective:   VITALS: Per patient if applicable, see vitals. GENERAL: Alert and in no acute distress. CARDIOPULMONARY: No increased WOB. Speaking in clear sentences.  PSYCH: Pleasant and cooperative. Speech normal rate and rhythm. Affect is appropriate. Insight and judgement are appropriate. Attention is focused, linear, and appropriate.  NEURO: Oriented as arrived to appointment on time with no prompting.   Lab Results  Component Value Date   CREATININE 0.94 05/27/2021   BUN 16 05/27/2021   NA 139 05/27/2021   K 4.0 05/27/2021   CL 105 05/27/2021   CO2 26 05/27/2021   Lab Results  Component Value Date   ALT 38 (H) 05/27/2021   AST 38 (H) 05/27/2021   ALKPHOS 45 05/27/2021   BILITOT 0.5 05/27/2021   Lab Results  Component Value Date   HGBA1C 5.5 05/27/2021   HGBA1C 5.7 12/31/2020   HGBA1C 5.4 12/04/2020   HGBA1C 5.7 (H) 06/12/2020   HGBA1C 5.9 (H) 01/23/2020   Lab Results  Component Value Date   INSULIN 17.8 06/12/2020   INSULIN 7.3 01/23/2020   Lab Results  Component Value Date   TSH 2.11 09/30/2018   Lab Results  Component Value Date   CHOL 140 05/27/2021   HDL 37.00 (L) 05/27/2021   LDLCALC 64 05/27/2021   LDLDIRECT 145.0 12/31/2020   TRIG 196.0 (H) 05/27/2021   CHOLHDL 4 05/27/2021   Lab Results  Component Value Date   WBC 9.2 05/27/2021   HGB 12.4 05/27/2021   HCT 37.6 05/27/2021   MCV 86.4 05/27/2021   PLT 397.0 05/27/2021   Lab Results  Component Value Date   FERRITIN 145 12/05/2020   Lab Results  Component Value Date   VD25OH 47.7 06/26/2021   VD25OH 67.2 06/12/2020   VD25OH 26.6 (L) 01/23/2020    Attestation Statements:   Reviewed by clinician on day of visit: allergies,  medications, problem list, medical history, surgical history, family history, social history, and previous encounter notes.

## 2021-08-04 ENCOUNTER — Ambulatory Visit (INDEPENDENT_AMBULATORY_CARE_PROVIDER_SITE_OTHER): Payer: Medicare HMO | Admitting: Nurse Practitioner

## 2021-08-04 ENCOUNTER — Encounter (INDEPENDENT_AMBULATORY_CARE_PROVIDER_SITE_OTHER): Payer: Self-pay | Admitting: Family Medicine

## 2021-08-04 ENCOUNTER — Telehealth (INDEPENDENT_AMBULATORY_CARE_PROVIDER_SITE_OTHER): Payer: Medicare HMO | Admitting: Family Medicine

## 2021-08-04 DIAGNOSIS — Z6834 Body mass index (BMI) 34.0-34.9, adult: Secondary | ICD-10-CM

## 2021-08-04 DIAGNOSIS — E669 Obesity, unspecified: Secondary | ICD-10-CM

## 2021-08-04 DIAGNOSIS — R7303 Prediabetes: Secondary | ICD-10-CM

## 2021-08-04 DIAGNOSIS — E559 Vitamin D deficiency, unspecified: Secondary | ICD-10-CM

## 2021-08-04 DIAGNOSIS — M545 Low back pain, unspecified: Secondary | ICD-10-CM | POA: Diagnosis not present

## 2021-08-04 MED ORDER — VITAMIN D 125 MCG (5000 UT) PO CAPS: 1.0000 | ORAL_CAPSULE | Freq: Every day | ORAL | 0 refills | Status: AC

## 2021-08-04 MED ORDER — METFORMIN HCL 500 MG PO TABS: 500.0000 mg | ORAL_TABLET | Freq: Two times a day (BID) | ORAL | 0 refills | Status: AC

## 2021-08-11 ENCOUNTER — Other Ambulatory Visit (HOSPITAL_BASED_OUTPATIENT_CLINIC_OR_DEPARTMENT_OTHER): Payer: Medicare HMO

## 2021-08-13 DIAGNOSIS — M545 Low back pain, unspecified: Secondary | ICD-10-CM | POA: Diagnosis not present

## 2021-08-20 ENCOUNTER — Encounter (INDEPENDENT_AMBULATORY_CARE_PROVIDER_SITE_OTHER): Payer: Self-pay

## 2021-08-20 DIAGNOSIS — M5116 Intervertebral disc disorders with radiculopathy, lumbar region: Secondary | ICD-10-CM | POA: Diagnosis not present

## 2021-08-20 DIAGNOSIS — Z79891 Long term (current) use of opiate analgesic: Secondary | ICD-10-CM | POA: Diagnosis not present

## 2021-08-20 DIAGNOSIS — Z5181 Encounter for therapeutic drug level monitoring: Secondary | ICD-10-CM | POA: Diagnosis not present

## 2021-08-20 DIAGNOSIS — M5416 Radiculopathy, lumbar region: Secondary | ICD-10-CM | POA: Diagnosis not present

## 2021-08-20 DIAGNOSIS — G894 Chronic pain syndrome: Secondary | ICD-10-CM | POA: Diagnosis not present

## 2021-08-20 DIAGNOSIS — M5030 Other cervical disc degeneration, unspecified cervical region: Secondary | ICD-10-CM | POA: Diagnosis not present

## 2021-08-20 DIAGNOSIS — M545 Low back pain, unspecified: Secondary | ICD-10-CM | POA: Diagnosis not present

## 2021-08-21 ENCOUNTER — Other Ambulatory Visit (HOSPITAL_BASED_OUTPATIENT_CLINIC_OR_DEPARTMENT_OTHER): Payer: Medicare HMO

## 2021-08-21 ENCOUNTER — Telehealth: Payer: Self-pay | Admitting: Family Medicine

## 2021-08-21 ENCOUNTER — Other Ambulatory Visit: Payer: Self-pay | Admitting: Family Medicine

## 2021-08-21 DIAGNOSIS — K219 Gastro-esophageal reflux disease without esophagitis: Secondary | ICD-10-CM

## 2021-08-21 DIAGNOSIS — R609 Edema, unspecified: Secondary | ICD-10-CM

## 2021-08-21 DIAGNOSIS — I10 Essential (primary) hypertension: Secondary | ICD-10-CM

## 2021-08-21 MED ORDER — HYDROCHLOROTHIAZIDE 25 MG PO TABS: 25.0000 mg | ORAL_TABLET | Freq: Every day | ORAL | 3 refills | Status: AC

## 2021-08-21 NOTE — Telephone Encounter (Signed)
Medication not on med list. Please advise

## 2021-08-21 NOTE — Telephone Encounter (Signed)
Patient called to request a refill for medication that was on 90 days supply. The last till was last April. Someone advised her it was discontinued.   Medication: Hydrochlorothiazide   Has the patient contacted their pharmacy? Yes.   Prescription was discontinued.  Preferred Pharmacy (with phone number or street name): [facility] [address], [place] - [facility]  [address] [place] [address]  Phone:  [phone]  Fax:  [phone]   Agent: Please be advised that RX refills may take up to 3 business days. We ask that you follow-up with your pharmacy.
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
## 2021-08-25 ENCOUNTER — Ambulatory Visit: Payer: Medicare HMO | Admitting: Psychiatry

## 2021-08-25 ENCOUNTER — Encounter: Payer: Self-pay | Admitting: Psychiatry

## 2021-08-25 VITALS — BP 143/86 | HR 85 | Temp 98.1°F | Wt 155.6 lb

## 2021-08-25 DIAGNOSIS — R5383 Other fatigue: Secondary | ICD-10-CM | POA: Diagnosis not present

## 2021-08-25 DIAGNOSIS — F0630 Mood disorder due to known physiological condition, unspecified: Secondary | ICD-10-CM | POA: Diagnosis not present

## 2021-08-25 NOTE — Patient Instructions (Signed)
Continue lexapro 20 mg daily Continue Abilify 5 mg daily Continue Ambien 5 mg daily as needed for insomnia Referral for evaluation of sleep apnea Obtaining labs (TSH, free T4) Obtain record from her psychologist Next appointment: 9/28 at 1:30

## 2021-08-26 ENCOUNTER — Ambulatory Visit (INDEPENDENT_AMBULATORY_CARE_PROVIDER_SITE_OTHER): Payer: Medicare HMO | Admitting: Family Medicine

## 2021-08-26 ENCOUNTER — Encounter: Payer: Self-pay | Admitting: Family Medicine

## 2021-08-26 ENCOUNTER — Other Ambulatory Visit: Payer: Self-pay | Admitting: Family Medicine

## 2021-08-26 VITALS — BP 124/63 | HR 73 | Temp 98.2°F | Resp 16 | Wt 154.0 lb

## 2021-08-26 DIAGNOSIS — R0683 Snoring: Secondary | ICD-10-CM

## 2021-08-26 DIAGNOSIS — R0602 Shortness of breath: Secondary | ICD-10-CM

## 2021-08-26 DIAGNOSIS — L600 Ingrowing nail: Secondary | ICD-10-CM | POA: Insufficient documentation

## 2021-08-26 DIAGNOSIS — S134XXA Sprain of ligaments of cervical spine, initial encounter: Secondary | ICD-10-CM

## 2021-08-26 MED ORDER — DOXYCYCLINE HYCLATE 100 MG PO TABS: 100.0000 mg | ORAL_TABLET | Freq: Two times a day (BID) | ORAL | 0 refills | Status: DC

## 2021-08-26 NOTE — Assessment & Plan Note (Signed)
abx per orders  Refer to podiatry if no better

## 2021-08-26 NOTE — Assessment & Plan Note (Signed)
Pt with hx sleep apnea Refer to pulmonary for sleep study

## 2021-08-26 NOTE — Patient Instructions (Signed)
Ingrown Toenail  An ingrown toenail occurs when the corner or sides of a toenail grow into the surrounding skin. This causes discomfort and pain. The big toe is most commonly affected, but any of the toes can be affected. If an ingrown toenail is not treated, it can become infected. What are the causes? This condition may be caused by: Wearing shoes that are too small or tight. An injury, such as stubbing your toe or having your toe stepped on. Improper cutting or care of your toenails. Having nail or foot abnormalities that were present from birth (congenital abnormalities), such as having a nail that is too big for your toe. What increases the risk? The following factors may make you more likely to develop ingrown toenails: Age. Nails tend to get thicker with age, so ingrown nails are more common among older people. Cutting your toenails incorrectly, such as cutting them very short or cutting them unevenly. An ingrown toenail is more likely to get infected if you have: Diabetes. Blood flow (circulation) problems. What are the signs or symptoms? Symptoms of an ingrown toenail may include: Pain, soreness, or tenderness. Redness. Swelling. Hardening of the skin that surrounds the toenail. Signs that an ingrown toenail may be infected include: Fluid or pus. Symptoms that get worse. How is this diagnosed? Ingrown toenails may be diagnosed based on: Your symptoms and medical history. A physical exam. Labs or tests. If you have fluid or blood coming from your toenail, a sample may be collected to test for the specific type of bacteria that is causing the infection. How is this treated? Treatment depends on the severity of your symptoms. You may be able to care for your toenail at home. If you have an infection, you may be prescribed antibiotic medicines. If you have fluid or pus draining from your toenail, your health care provider may drain it. If you have trouble walking, you may be  given crutches to use. If you have a severe or infected ingrown toenail, you may need a procedure to remove part or all of the nail. Follow these instructions at home: Foot care  Check your wound every day for signs of infection, or as often as told by your health care provider. Check for: More redness, swelling, or pain. More fluid or blood. Warmth. Pus or a bad smell. Do not pick at your toenail or try to remove it yourself. Soak your foot in warm, soapy water. Do this for 20 minutes, 3 times a day, or as often as told by your health care provider. This helps to keep your toe clean and your skin soft. Wear shoes that fit well and are not too tight. Your health care provider may recommend that you wear open-toed shoes while you heal. Trim your toenails regularly and carefully. Cut your toenails straight across to prevent injury to the skin at the corners of the toenail. Do not cut your nails in a curved shape. Keep your feet clean and dry to help prevent infection. General instructions Take over-the-counter and prescription medicines only as told by your health care provider. If you were prescribed an antibiotic, take it as told by your health care provider. Do not stop taking the antibiotic even if you start to feel better. If your health care provider told you to use crutches to help you move around, use them as instructed. Return to your normal activities as told by your health care provider. Ask your health care provider what activities are safe for you.   Keep all follow-up visits. This is important. Contact a health care provider if: You have more redness, swelling, pain, or other symptoms that do not improve with treatment. You have fluid, blood, or pus coming from your toenail. You have a red streak on your skin that starts at your foot and spreads up your leg. You have a fever. Summary An ingrown toenail occurs when the corner or sides of a toenail grow into the surrounding skin.  This causes discomfort and pain. The big toe is most commonly affected, but any of the toes can be affected. If an ingrown toenail is not treated, it can become infected. Fluid or pus draining from your toenail is a sign of infection. Your health care provider may need to drain it. You may be given antibiotics to treat the infection. Trimming your toenails regularly and properly can help you prevent an ingrown toenail. This information is not intended to replace advice given to you by your health care provider. Make sure you discuss any questions you have with your health care provider. Document Revised: 04/30/2020 Document Reviewed: 04/30/2020 Elsevier Patient Education  2023 [employer]  

## 2021-08-26 NOTE — Progress Notes (Signed)
Established Patient Office Visit  Subjective   Patient ID: [name], female    DOB: [birth year]  Age: 71 y.o. MRN: [id number]  Chief Complaint  Patient presents with   Toe swelling    Complains of swelling on left great toe    HPI Pt is here c/o pain and swelling L big toenail.   She had a pedicure and she usually digs out the nail to prevent it from being ingrown but because it looked like it was going to fall off she did not do it this time.   Pt also c/o snoring and sob and her psych mentioned it recently too ----    Review of Systems  Constitutional:  Negative for fever and malaise/fatigue.  HENT:  Negative for congestion.   Eyes:  Negative for blurred vision.  Respiratory:  Positive for shortness of breath. Negative for cough.   Cardiovascular:  Negative for chest pain, palpitations and leg swelling.  Gastrointestinal:  Negative for abdominal pain, blood in stool and nausea.  Genitourinary:  Negative for dysuria and frequency.  Musculoskeletal:  Negative for falls.  Skin:  Negative for rash.  Neurological:  Negative for dizziness, loss of consciousness and headaches.  Endo/Heme/Allergies:  Negative for environmental allergies.  Psychiatric/Behavioral:  Negative for depression. The patient is not nervous/anxious.       Objective:     BP 124/63 (BP Location: Left Arm, Patient Position: Sitting, Cuff Size: Small)   Pulse 73   Temp 98.2 F (36.8 C) (Oral)   Resp 16   Wt 154 lb (69.9 kg)   SpO2 96%   BMI 35.79 kg/m    Physical Exam Vitals and nursing note reviewed.  Constitutional:      Appearance: She is well-developed.  HENT:     Head: Normocephalic and atraumatic.  Eyes:     Conjunctiva/sclera: Conjunctivae normal.  Neck:     Thyroid: No thyromegaly.     Vascular: No carotid bruit or JVD.  Cardiovascular:     Rate and Rhythm: Normal rate and regular rhythm.     Heart sounds: Normal heart sounds. No murmur heard. Pulmonary:     Effort: Pulmonary  effort is normal. No respiratory distress.     Breath sounds: Normal breath sounds. No wheezing or rales.  Chest:     Chest wall: No tenderness.  Musculoskeletal:     Cervical back: Normal range of motion and neck supple.       Legs:     Comments: Medial side big toenail ingrown and tender  + errythema  No swelling   Neurological:     Mental Status: She is alert and oriented to person, place, and time.      No results found for any visits on 08/26/21.    The 10-year ASCVD risk score (Arnett DK, et al., 2019) is: 23.2%    Assessment & Plan:   Problem List Items Addressed This Visit       Unprioritized   SOB (shortness of breath)   Relevant Orders   Ambulatory referral to Pulmonology   DG Chest 2 View   Snoring    Pt with hx sleep apnea Refer to pulmonary for sleep study       Relevant Orders   Ambulatory referral to Pulmonology   Ingrown toenail - Primary    abx per orders  Refer to podiatry if no better       Relevant Medications   doxycycline (VIBRA-TABS) 100 MG tablet  Return if symptoms worsen or fail to improve.    [name], DO

## 2021-08-28 ENCOUNTER — Telehealth: Payer: Self-pay | Admitting: Psychiatry

## 2021-08-28 NOTE — Telephone Encounter (Signed)
Reviewed a record from [place], psychology testing report on Nov 1, 2 , 2021.   Diagnosis-Bipolar 2 disorder, somatic symptom disorder "Based on current data, [name] does not appear likely to have ADHD as her symptoms are more indicated of more severe emotional and behavioral regulation conditions."

## 2021-08-29 ENCOUNTER — Institutional Professional Consult (permissible substitution): Payer: Medicare HMO | Admitting: Primary Care

## 2021-09-01 ENCOUNTER — Telehealth (INDEPENDENT_AMBULATORY_CARE_PROVIDER_SITE_OTHER): Payer: Medicare HMO | Admitting: Family Medicine

## 2021-09-01 ENCOUNTER — Ambulatory Visit (INDEPENDENT_AMBULATORY_CARE_PROVIDER_SITE_OTHER): Payer: Medicare HMO | Admitting: Nurse Practitioner

## 2021-09-01 ENCOUNTER — Other Ambulatory Visit (HOSPITAL_BASED_OUTPATIENT_CLINIC_OR_DEPARTMENT_OTHER): Payer: Medicare HMO

## 2021-09-01 ENCOUNTER — Other Ambulatory Visit: Payer: Self-pay | Admitting: Family Medicine

## 2021-09-01 ENCOUNTER — Encounter (INDEPENDENT_AMBULATORY_CARE_PROVIDER_SITE_OTHER): Payer: Self-pay | Admitting: Family Medicine

## 2021-09-01 DIAGNOSIS — Z6834 Body mass index (BMI) 34.0-34.9, adult: Secondary | ICD-10-CM

## 2021-09-01 DIAGNOSIS — K5909 Other constipation: Secondary | ICD-10-CM | POA: Diagnosis not present

## 2021-09-01 DIAGNOSIS — R0683 Snoring: Secondary | ICD-10-CM

## 2021-09-01 DIAGNOSIS — E785 Hyperlipidemia, unspecified: Secondary | ICD-10-CM

## 2021-09-01 DIAGNOSIS — E669 Obesity, unspecified: Secondary | ICD-10-CM

## 2021-09-01 NOTE — Progress Notes (Signed)
TeleHealth Visit:  This visit was completed with telemedicine (audio/video) technology. [name] has verbally consented to this TeleHealth visit. The patient is located at home, the provider is located at home. The participants in this visit include the listed provider and patient. The visit was conducted today via MyChart video.  OBESITY [name] is here to discuss her progress with her obesity treatment plan along with follow-up of her obesity related diagnoses.   Today's visit was # 19 Starting weight: 148 lbs Starting date: 01/23/2020 Weight at last in office visit: 148 lbs on 07/14/21 Total weight loss: 0 lbs at last in office visit on 07/14/21. Today's reported weight: 152 lbs   Nutrition Plan: keeping a food journal and adhering to recommended goals of 1000 calories and 80+ protein.  Hunger is moderately controlled.  Current exercise: Walking.  Closes exercise ring on her Apple Watch 3 to 4 days/week.  Interim History: [name] says she journaled consistently for 2 weeks and met calorie and protein goals (70 gms) but gained weight.  She feels part of this is due to having inadequate bowel movements despite taking Linzess.  Currently she is just trying to make healthy food choices.  She is usually deficient with her protein intake. She has been on Ozempic in the past but stopped due to cost.  Was successful with weight loss while taking it but has steadily regained since being off.  Assessment/Plan:  1. Snoring She has been referred for an appointment with pulmonology but cannot attend the appointment due to work conflict.  Sleep study completed in January 2018 was negative for sleep apnea.  Plan: Reschedule pulmonology appointment to be evaluated for sleep study.  2.  Chronic constipation Not well controlled.  On Linzess 72 mcg but reports she must take MiraLAX with this in order to have bowel movements.  She has some of the higher dose (145 mcg) left which works  better.  Plan: Follow-up with PCP for management.  3. Obesity: Current BMI 34.4 Carlean is currently in the action stage of change. As such, her goal is to continue with weight loss efforts.  She has agreed to the Category 1 Plan.  Repeat IC next office visit since she did not lose weight with 1000 cal and 70 g of protein per day.  Exercise goals: Encouraged her to incorporate resistance training into her exercise regimen.  She has a free [employer] and would like to do water aerobics classes.  Behavioral modification strategies: increasing lean protein intake, decreasing simple carbohydrates, increasing vegetables, increasing water intake, and no skipping meals.  [name] has agreed to follow-up with our clinic in 4 weeks with fasting IC-Dr. Jearld Shines.  No orders of the defined types were placed in this encounter.   There are no discontinued medications.   No orders of the defined types were placed in this encounter.     Objective:   VITALS: Per patient if applicable, see vitals. GENERAL: Alert and in no acute distress. CARDIOPULMONARY: No increased WOB. Speaking in clear sentences.  PSYCH: Pleasant and cooperative. Speech normal rate and rhythm. Affect is appropriate. Insight and judgement are appropriate. Attention is focused, linear, and appropriate.  NEURO: Oriented as arrived to appointment on time with no prompting.   Lab Results  Component Value Date   CREATININE 0.94 05/27/2021   BUN 16 05/27/2021   NA 139 05/27/2021   K 4.0 05/27/2021   CL 105 05/27/2021   CO2 26 05/27/2021   Lab Results  Component Value Date  ALT 38 (H) 05/27/2021   AST 38 (H) 05/27/2021   ALKPHOS 45 05/27/2021   BILITOT 0.5 05/27/2021   Lab Results  Component Value Date   HGBA1C 5.5 05/27/2021   HGBA1C 5.7 12/31/2020   HGBA1C 5.4 12/04/2020   HGBA1C 5.7 (H) 06/12/2020   HGBA1C 5.9 (H) 01/23/2020   Lab Results  Component Value Date   INSULIN 17.8 06/12/2020   INSULIN 7.3  01/23/2020   Lab Results  Component Value Date   TSH 2.11 09/30/2018   Lab Results  Component Value Date   CHOL 140 05/27/2021   HDL 37.00 (L) 05/27/2021   LDLCALC 64 05/27/2021   LDLDIRECT 145.0 12/31/2020   TRIG 196.0 (H) 05/27/2021   CHOLHDL 4 05/27/2021   Lab Results  Component Value Date   WBC 9.2 05/27/2021   HGB 12.4 05/27/2021   HCT 37.6 05/27/2021   MCV 86.4 05/27/2021   PLT 397.0 05/27/2021   Lab Results  Component Value Date   FERRITIN 145 12/05/2020   Lab Results  Component Value Date   VD25OH 47.7 06/26/2021   VD25OH 67.2 06/12/2020   VD25OH 26.6 (L) 01/23/2020    Attestation Statements:   Reviewed by clinician on day of visit: allergies, medications, problem list, medical history, surgical history, family history, social history, and previous encounter notes.  Time spent on visit including the items listed below was 32 minutes.  -preparing to see the patient (e.g., review of tests, history, previous notes) -obtaining and/or reviewing separately obtained history -counseling and educating the patient/family/caregiver -documenting clinical information in the electronic or other health record

## 2021-09-04 ENCOUNTER — Ambulatory Visit (HOSPITAL_BASED_OUTPATIENT_CLINIC_OR_DEPARTMENT_OTHER)
Admission: RE | Admit: 2021-09-04 | Discharge: 2021-09-04 | Disposition: A | Discharge status: Home / Self Care | Payer: Medicare HMO | Source: Ambulatory Visit | Attending: Family Medicine | Admitting: Family Medicine

## 2021-09-04 DIAGNOSIS — M8580 Other specified disorders of bone density and structure, unspecified site: Secondary | ICD-10-CM | POA: Insufficient documentation

## 2021-09-04 DIAGNOSIS — M8589 Other specified disorders of bone density and structure, multiple sites: Secondary | ICD-10-CM | POA: Diagnosis not present

## 2021-09-04 DIAGNOSIS — Z780 Asymptomatic menopausal state: Secondary | ICD-10-CM | POA: Insufficient documentation

## 2021-09-05 ENCOUNTER — Institutional Professional Consult (permissible substitution): Payer: Medicare HMO | Admitting: Pulmonary Disease

## 2021-09-26 ENCOUNTER — Encounter (INDEPENDENT_AMBULATORY_CARE_PROVIDER_SITE_OTHER): Payer: Self-pay | Admitting: Family Medicine

## 2021-09-29 ENCOUNTER — Ambulatory Visit (INDEPENDENT_AMBULATORY_CARE_PROVIDER_SITE_OTHER): Payer: Medicare HMO | Admitting: Family Medicine

## 2021-09-29 NOTE — Telephone Encounter (Signed)
Please address
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
## 2021-10-08 ENCOUNTER — Ambulatory Visit (INDEPENDENT_AMBULATORY_CARE_PROVIDER_SITE_OTHER): Payer: Medicare HMO | Admitting: Pulmonary Disease

## 2021-10-08 ENCOUNTER — Encounter: Payer: Self-pay | Admitting: Pulmonary Disease

## 2021-10-08 VITALS — BP 110/76 | HR 85

## 2021-10-08 DIAGNOSIS — R0683 Snoring: Secondary | ICD-10-CM | POA: Diagnosis not present

## 2021-10-08 DIAGNOSIS — M545 Low back pain, unspecified: Secondary | ICD-10-CM | POA: Diagnosis not present
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
## 2021-10-08 NOTE — Patient Instructions (Signed)
Schedule you for an in lab sleep study  Your last sleep study was negative  Continue weight loss efforts as able  Follow-up in about 3 months or sooner after sleep study is done  Call us with significant concerns

## 2021-10-09 ENCOUNTER — Ambulatory Visit: Payer: Medicare HMO | Admitting: Psychiatry

## 2021-10-09 ENCOUNTER — Encounter: Payer: Self-pay | Admitting: Psychiatry

## 2021-10-09 ENCOUNTER — Other Ambulatory Visit
Admission: RE | Admit: 2021-10-09 | Discharge: 2021-10-09 | Disposition: A | Discharge status: Home / Self Care | Payer: Medicare HMO | Attending: Psychiatry | Admitting: Psychiatry

## 2021-10-09 VITALS — BP 118/74 | HR 82 | Temp 97.9°F | Ht <= 58 in | Wt 158.0 lb

## 2021-10-09 DIAGNOSIS — R5383 Other fatigue: Secondary | ICD-10-CM

## 2021-10-09 DIAGNOSIS — F4310 Post-traumatic stress disorder, unspecified: Secondary | ICD-10-CM

## 2021-10-09 DIAGNOSIS — F0630 Mood disorder due to known physiological condition, unspecified: Secondary | ICD-10-CM | POA: Diagnosis not present

## 2021-10-09 LAB — TSH: TSH: 2.63 u[IU]/mL (ref 0.350–4.500)

## 2021-10-15 DIAGNOSIS — M545 Low back pain, unspecified: Secondary | ICD-10-CM | POA: Diagnosis not present

## 2021-10-17 DIAGNOSIS — N951 Menopausal and female climacteric states: Secondary | ICD-10-CM | POA: Diagnosis not present

## 2021-10-17 DIAGNOSIS — Z130 Encounter for screening for diseases of the blood and blood-forming organs and certain disorders involving the immune mechanism: Secondary | ICD-10-CM | POA: Diagnosis not present

## 2021-10-17 DIAGNOSIS — E559 Vitamin D deficiency, unspecified: Secondary | ICD-10-CM | POA: Diagnosis not present

## 2021-10-17 DIAGNOSIS — Z131 Encounter for screening for diabetes mellitus: Secondary | ICD-10-CM | POA: Diagnosis not present

## 2021-10-17 DIAGNOSIS — E7800 Pure hypercholesterolemia, unspecified: Secondary | ICD-10-CM | POA: Diagnosis not present

## 2021-10-17 DIAGNOSIS — Z1329 Encounter for screening for other suspected endocrine disorder: Secondary | ICD-10-CM | POA: Diagnosis not present

## 2021-10-17 DIAGNOSIS — R635 Abnormal weight gain: Secondary | ICD-10-CM | POA: Diagnosis not present

## 2021-10-21 DIAGNOSIS — F32A Depression, unspecified: Secondary | ICD-10-CM | POA: Diagnosis not present

## 2021-10-21 DIAGNOSIS — K5900 Constipation, unspecified: Secondary | ICD-10-CM | POA: Diagnosis not present

## 2021-10-21 DIAGNOSIS — E7800 Pure hypercholesterolemia, unspecified: Secondary | ICD-10-CM | POA: Diagnosis not present

## 2021-10-21 DIAGNOSIS — Z1331 Encounter for screening for depression: Secondary | ICD-10-CM | POA: Diagnosis not present

## 2021-10-21 DIAGNOSIS — F419 Anxiety disorder, unspecified: Secondary | ICD-10-CM | POA: Diagnosis not present

## 2021-10-21 DIAGNOSIS — Z1339 Encounter for screening examination for other mental health and behavioral disorders: Secondary | ICD-10-CM | POA: Diagnosis not present

## 2021-10-21 DIAGNOSIS — M8580 Other specified disorders of bone density and structure, unspecified site: Secondary | ICD-10-CM | POA: Diagnosis not present

## 2021-10-21 DIAGNOSIS — G2581 Restless legs syndrome: Secondary | ICD-10-CM | POA: Diagnosis not present

## 2021-10-21 DIAGNOSIS — E559 Vitamin D deficiency, unspecified: Secondary | ICD-10-CM | POA: Diagnosis not present

## 2021-10-22 ENCOUNTER — Encounter: Payer: Self-pay | Admitting: Family Medicine

## 2021-10-22 ENCOUNTER — Ambulatory Visit (INDEPENDENT_AMBULATORY_CARE_PROVIDER_SITE_OTHER): Payer: Medicare HMO | Admitting: Family Medicine

## 2021-10-22 VITALS — BP 120/78 | HR 80 | Temp 98.2°F | Ht <= 58 in | Wt 158.2 lb

## 2021-10-22 DIAGNOSIS — H6121 Impacted cerumen, right ear: Secondary | ICD-10-CM

## 2021-10-22 DIAGNOSIS — M67431 Ganglion, right wrist: Secondary | ICD-10-CM | POA: Diagnosis not present

## 2021-10-22 DIAGNOSIS — M79644 Pain in right finger(s): Secondary | ICD-10-CM | POA: Diagnosis not present
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
## 2021-10-22 NOTE — Patient Instructions (Addendum)
OK to take Tylenol 1000 mg (2 extra strength tabs) or 975 mg (3 regular strength tabs) every 6 hours as needed.  OK to use Debrox (peroxide) in the ear to loosen up wax. Also recommend using a bulb syringe (for removing boogers from baby's noses) to flush through warm water and vinegar (3-4:1 ratio). An alternative, though more expensive, is an elephant ear washer wax removal kit. Do not use Q-tips as this can impact wax further.  Let us know if you need anything.  

## 2021-10-23 DIAGNOSIS — M545 Low back pain, unspecified: Secondary | ICD-10-CM | POA: Diagnosis not present

## 2021-10-30 DIAGNOSIS — E7800 Pure hypercholesterolemia, unspecified: Secondary | ICD-10-CM | POA: Diagnosis not present

## 2021-10-30 DIAGNOSIS — M545 Low back pain, unspecified: Secondary | ICD-10-CM | POA: Diagnosis not present

## 2021-11-05 ENCOUNTER — Telehealth: Payer: Self-pay | Admitting: Family Medicine

## 2021-11-05 DIAGNOSIS — M8580 Other specified disorders of bone density and structure, unspecified site: Secondary | ICD-10-CM | POA: Diagnosis not present

## 2021-11-05 DIAGNOSIS — M545 Low back pain, unspecified: Secondary | ICD-10-CM | POA: Diagnosis not present

## 2021-11-05 NOTE — Telephone Encounter (Signed)
FYI- Patient called to request medication that has been discontinued. She would like 30 day supply sent to [facility], [place], [place] [address]  [phone] and 90 day supply sent to [facility] mail order.    Medication: omeprazole (PRILOSEC) 40 MG capsule [[id number]   Has the patient contacted their pharmacy? Yes.    3 requests have been sent per patient  Preferred Pharmacy (with phone number or street name): [facility], [address] [place], [place] [address] Phone: [phone]  Fax: [phone]   Agent: Please be advised that RX refills may take up to 3 business days. We ask that you follow-up with your pharmacy.

## 2021-11-05 NOTE — Telephone Encounter (Signed)
Please advise 

## 2021-11-06 ENCOUNTER — Other Ambulatory Visit: Payer: Self-pay | Admitting: Family Medicine

## 2021-11-06 ENCOUNTER — Other Ambulatory Visit: Payer: Self-pay | Admitting: *Deleted

## 2021-11-06 MED ORDER — OMEPRAZOLE 40 MG PO CPDR: 40.0000 mg | DELAYED_RELEASE_CAPSULE | Freq: Every day | ORAL | 0 refills | Status: DC

## 2021-11-06 MED ORDER — OMEPRAZOLE 40 MG PO CPDR: 40.0000 mg | DELAYED_RELEASE_CAPSULE | Freq: Every day | ORAL | 3 refills | Status: DC

## 2021-11-06 MED ORDER — OMEPRAZOLE 40 MG PO CPDR: 40.0000 mg | DELAYED_RELEASE_CAPSULE | Freq: Every day | ORAL | 0 refills | Status: AC

## 2021-11-11 DIAGNOSIS — M545 Low back pain, unspecified: Secondary | ICD-10-CM | POA: Diagnosis not present

## 2021-11-12 ENCOUNTER — Other Ambulatory Visit: Payer: Self-pay

## 2021-11-12 DIAGNOSIS — E7800 Pure hypercholesterolemia, unspecified: Secondary | ICD-10-CM | POA: Diagnosis not present

## 2021-11-19 DIAGNOSIS — M8580 Other specified disorders of bone density and structure, unspecified site: Secondary | ICD-10-CM | POA: Diagnosis not present

## 2021-11-19 DIAGNOSIS — M545 Low back pain, unspecified: Secondary | ICD-10-CM | POA: Diagnosis not present

## 2021-11-19 DIAGNOSIS — E559 Vitamin D deficiency, unspecified: Secondary | ICD-10-CM | POA: Diagnosis not present

## 2021-11-26 DIAGNOSIS — E559 Vitamin D deficiency, unspecified: Secondary | ICD-10-CM | POA: Diagnosis not present

## 2021-12-02 DIAGNOSIS — M542 Cervicalgia: Secondary | ICD-10-CM | POA: Diagnosis not present
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
## 2021-12-08 ENCOUNTER — Ambulatory Visit: Payer: Medicare HMO | Admitting: Psychiatry

## 2021-12-10 DIAGNOSIS — E7800 Pure hypercholesterolemia, unspecified: Secondary | ICD-10-CM | POA: Diagnosis not present

## 2021-12-10 DIAGNOSIS — Z6835 Body mass index (BMI) 35.0-35.9, adult: Secondary | ICD-10-CM | POA: Diagnosis not present

## 2021-12-10 DIAGNOSIS — M542 Cervicalgia: Secondary | ICD-10-CM | POA: Diagnosis not present

## 2021-12-16 ENCOUNTER — Ambulatory Visit: Payer: Medicare HMO | Attending: Otolaryngology

## 2021-12-16 ENCOUNTER — Ambulatory Visit: Payer: Medicare HMO | Admitting: Pulmonary Disease

## 2021-12-16 DIAGNOSIS — R0683 Snoring: Secondary | ICD-10-CM | POA: Insufficient documentation

## 2021-12-17 ENCOUNTER — Telehealth: Payer: Self-pay | Admitting: Pharmacist

## 2021-12-17 NOTE — Telephone Encounter (Signed)
Patient was noted to have low adherence for metformin and is due to refill both metformin and Ozempic. Called to review meds and updated med list but unable to reach patient. LM on VM reminding to refill medications and to call office if any problems with getting meds / cost.  Left contact information [phone]

## 2021-12-22 ENCOUNTER — Other Ambulatory Visit: Payer: Self-pay | Admitting: Family Medicine

## 2021-12-22 DIAGNOSIS — F411 Generalized anxiety disorder: Secondary | ICD-10-CM

## 2022-02-10 ENCOUNTER — Other Ambulatory Visit: Payer: Self-pay | Admitting: Family Medicine

## 2022-02-10 ENCOUNTER — Ambulatory Visit
Admission: RE | Admit: 2022-02-10 | Discharge: 2022-02-10 | Disposition: A | Discharge status: Home / Self Care | Payer: Medicare HMO | Source: Ambulatory Visit | Attending: Family Medicine | Admitting: Family Medicine

## 2022-02-10 DIAGNOSIS — Z9181 History of falling: Secondary | ICD-10-CM

## 2022-03-08 ENCOUNTER — Other Ambulatory Visit: Payer: Self-pay | Admitting: Family Medicine

## 2022-03-08 DIAGNOSIS — F411 Generalized anxiety disorder: Secondary | ICD-10-CM

## 2022-04-28 ENCOUNTER — Other Ambulatory Visit: Payer: Self-pay | Admitting: Family Medicine

## 2022-04-28 DIAGNOSIS — S134XXA Sprain of ligaments of cervical spine, initial encounter: Secondary | ICD-10-CM

## 2022-04-28 DIAGNOSIS — G2581 Restless legs syndrome: Secondary | ICD-10-CM

## 2022-05-18 ENCOUNTER — Other Ambulatory Visit: Payer: Self-pay | Admitting: Family Medicine

## 2022-05-31 ENCOUNTER — Other Ambulatory Visit: Payer: Self-pay | Admitting: Family Medicine

## 2022-05-31 DIAGNOSIS — F411 Generalized anxiety disorder: Secondary | ICD-10-CM

## 2022-07-02 ENCOUNTER — Other Ambulatory Visit: Payer: Self-pay | Admitting: Family Medicine

## 2022-07-02 ENCOUNTER — Ambulatory Visit
Admission: RE | Admit: 2022-07-02 | Discharge: 2022-07-02 | Disposition: A | Discharge status: Home / Self Care | Payer: Medicare HMO | Source: Ambulatory Visit | Attending: Family Medicine | Admitting: Family Medicine

## 2022-07-02 DIAGNOSIS — R0781 Pleurodynia: Secondary | ICD-10-CM

## 2022-07-28 ENCOUNTER — Other Ambulatory Visit: Payer: Self-pay | Admitting: Family Medicine

## 2022-07-28 DIAGNOSIS — Z1231 Encounter for screening mammogram for malignant neoplasm of breast: Secondary | ICD-10-CM

## 2023-11-30 ENCOUNTER — Other Ambulatory Visit: Payer: Self-pay | Admitting: Family Medicine

## 2023-11-30 DIAGNOSIS — N644 Mastodynia: Secondary | ICD-10-CM

## 2023-12-02 ENCOUNTER — Inpatient Hospital Stay
Admission: RE | Admit: 2023-12-02 | Discharge: 2023-12-02 | Disposition: A | Discharge status: Home / Self Care | Payer: Self-pay | Source: Ambulatory Visit | Attending: Family Medicine | Admitting: Family Medicine

## 2023-12-02 ENCOUNTER — Other Ambulatory Visit: Payer: Self-pay | Admitting: *Deleted

## 2023-12-02 DIAGNOSIS — Z1231 Encounter for screening mammogram for malignant neoplasm of breast: Secondary | ICD-10-CM

## 2023-12-08 ENCOUNTER — Ambulatory Visit
Admission: RE | Admit: 2023-12-08 | Discharge: 2023-12-08 | Disposition: A | Discharge status: Home / Self Care | Source: Ambulatory Visit | Attending: Family Medicine | Admitting: Family Medicine

## 2023-12-08 DIAGNOSIS — N644 Mastodynia: Secondary | ICD-10-CM
